# Patient Record
Sex: Female | Born: 1990
Health system: Southern US, Community
[De-identification: ages and names within clinical notes are randomized; demographics above are authoritative.]

## PROBLEM LIST (undated history)

## (undated) VITALS — BP 99/52 | HR 119 | Temp 97.6°F | Resp 16 | Ht 63.0 in | Wt 141.0 lb

## (undated) DIAGNOSIS — F419 Anxiety disorder, unspecified: Secondary | ICD-10-CM

## (undated) DIAGNOSIS — F32A Depression, unspecified: Secondary | ICD-10-CM

## (undated) DIAGNOSIS — F329 Major depressive disorder, single episode, unspecified: Secondary | ICD-10-CM

## (undated) DIAGNOSIS — K529 Noninfective gastroenteritis and colitis, unspecified: Secondary | ICD-10-CM

## (undated) DIAGNOSIS — S93409A Sprain of unspecified ligament of unspecified ankle, initial encounter: Secondary | ICD-10-CM

## (undated) DIAGNOSIS — K51011 Ulcerative (chronic) pancolitis with rectal bleeding: Secondary | ICD-10-CM

## (undated) DIAGNOSIS — E871 Hypo-osmolality and hyponatremia: Secondary | ICD-10-CM

## (undated) DIAGNOSIS — I82409 Acute embolism and thrombosis of unspecified deep veins of unspecified lower extremity: Secondary | ICD-10-CM

## (undated) HISTORY — DX: Hypo-osmolality and hyponatremia: E87.1

## (undated) HISTORY — PX: WISDOM TOOTH EXTRACTION: SHX21

## (undated) HISTORY — DX: Depression, unspecified: F32.A

## (undated) HISTORY — DX: Sprain of unspecified ligament of unspecified ankle, initial encounter: S93.409A

## (undated) HISTORY — DX: Major depressive disorder, single episode, unspecified: F32.9

---

## 2013-12-31 ENCOUNTER — Ambulatory Visit (INDEPENDENT_AMBULATORY_CARE_PROVIDER_SITE_OTHER): Payer: Self-pay | Admitting: Family Medicine

## 2013-12-31 ENCOUNTER — Encounter: Payer: Self-pay | Admitting: Family Medicine

## 2013-12-31 VITALS — BP 118/80 | HR 72 | Temp 98.2°F | Ht 64.0 in | Wt 137.5 lb

## 2013-12-31 DIAGNOSIS — F32A Depression, unspecified: Secondary | ICD-10-CM

## 2013-12-31 DIAGNOSIS — F3289 Other specified depressive episodes: Secondary | ICD-10-CM

## 2013-12-31 DIAGNOSIS — F329 Major depressive disorder, single episode, unspecified: Secondary | ICD-10-CM

## 2013-12-31 DIAGNOSIS — Z7689 Persons encountering health services in other specified circumstances: Secondary | ICD-10-CM

## 2013-12-31 DIAGNOSIS — Z7189 Other specified counseling: Secondary | ICD-10-CM

## 2013-12-31 NOTE — Progress Notes (Signed)
Pre visit review using our clinic review tool, if applicable. No additional management support is needed unless otherwise documented below in the visit note. 

## 2013-12-31 NOTE — Patient Instructions (Signed)
-  We have ordered labs or studies at this visit. It can take up to 1-2 weeks for results and processing. We will contact you with instructions IF your results are abnormal. Normal results will be released to your [facility]. If you have not heard from [place] or can not find your results in [facility] in 2 weeks please contact our office.  -PLEASE SIGN UP FOR MYCHART TODAY   We recommend the following healthy lifestyle measures: - eat a healthy diet consisting of lots of vegetables, fruits, beans, nuts, seeds, healthy meats such as white chicken and fish and whole grains.  - avoid fried foods, fast food, processed foods, sodas, red meet and other fattening foods.  - get a least 150 minutes of aerobic exercise per week.   -regular exercise, counseling and scheduling time for things you enjoy can help depression. If worsening please let us know.  Follow up in: as needed, yearly preventive visit

## 2013-12-31 NOTE — Progress Notes (Signed)
No chief complaint on file.   HPI:  Monica Ortega is here to establish care. New to getting a family doctor.  Last PCP and physical: sees Dr. Benjie Karvonen for yearly gyn exams and contraception  Has the following chronic problems and concerns today:  There are no active problems to display for this patient.  Depression: -trying to do exercise and getting out of the house and ok at this time -depressed mood and fatigue at times, has had counseling in the past, no medication or hospitalization in the past -no thoughts of self harm, no SI, no crying or anxiety  Contraception: -on OCP -tolerates will, no htn, migraine  -periods regular and normal  ROS: See pertinent positives and negatives per HPI.  Past Medical History  Diagnosis Date  . Depression     denies hospitalization or medication  . Ankle sprain     Family History  Problem Relation Age of Onset  . Diabetes Mother   . Hypertension Father     History   Social History  . Marital Status: Unknown    Spouse Name: N/A    Number of Children: N/A  . Years of Education: N/A   Social History Main Topics  . Smoking status: Never Smoker   . Smokeless tobacco: None  . Alcohol Use: Yes     Comment: 2 drinks per month  . Drug Use: No  . Sexual Activity: Not Currently   Other Topics Concern  . 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  GENERAL: vitals reviewed and listed above, alert, oriented, appears well hydrated and in no acute distress  HEENT: atraumatic, conjunttiva clear, no obvious abnormalities on inspection of external nose and ears  NECK: no obvious masses on inspection  LUNGS: clear to auscultation bilaterally, no wheezes, rales or rhonchi, good air movement  CV: HRRR, no peripheral edema  MS: moves all extremities without noticeable abnormality  PSYCH: pleasant and cooperative, no obvious depression or anxiety  ASSESSMENT AND PLAN:  Discussed the following assessment and plan:  Encounter to establish care  Depression   -We reviewed the PMH, PSH, FH, SH, Meds and Allergies. -We provided refills for any medications we will prescribe as needed. -We addressed current concerns per orders and patient instructions. -We have asked for records for pertinent exams, studies, vaccines and notes from previous providers. -We have advised patient to follow up per instructions below. -offered preventive visit - she will check with insurance and schedule if covered  -Patient advised to return or notify a doctor immediately if symptoms worsen or persist or new concerns arise.  Patient Instructions  -We have ordered labs or studies at this visit. It can take up to 1-2 weeks  for results and processing. We will contact you with instructions IF your results are abnormal. Normal results will be released to your [facility]. If you have not heard from [place] or can not find your results in [facility] in 2 weeks please contact our office.  -PLEASE SIGN UP FOR MYCHART TODAY   We recommend the following healthy lifestyle measures: - eat a healthy diet consisting of lots of vegetables, fruits, beans, nuts, seeds, healthy meats such as white chicken and fish and whole grains.  - avoid fried foods, fast food, processed foods, sodas, red meet and other fattening  foods.  - get a least 150 minutes of aerobic exercise per week.   -regular exercise, counseling and scheduling time for things you enjoy can help depression. If worsening please let us know.  Follow up in: as needed, yearly preventive visit      KIM, Nickola Major.

## 2014-09-10 ENCOUNTER — Ambulatory Visit (INDEPENDENT_AMBULATORY_CARE_PROVIDER_SITE_OTHER): Payer: BLUE CROSS/BLUE SHIELD | Admitting: Psychology

## 2014-09-10 DIAGNOSIS — F341 Dysthymic disorder: Secondary | ICD-10-CM | POA: Diagnosis not present

## 2014-09-18 ENCOUNTER — Ambulatory Visit (INDEPENDENT_AMBULATORY_CARE_PROVIDER_SITE_OTHER): Payer: BLUE CROSS/BLUE SHIELD | Admitting: Psychology

## 2014-09-18 DIAGNOSIS — F341 Dysthymic disorder: Secondary | ICD-10-CM | POA: Diagnosis not present

## 2014-09-26 ENCOUNTER — Ambulatory Visit (INDEPENDENT_AMBULATORY_CARE_PROVIDER_SITE_OTHER): Payer: BLUE CROSS/BLUE SHIELD | Admitting: Psychology

## 2014-09-26 DIAGNOSIS — F341 Dysthymic disorder: Secondary | ICD-10-CM

## 2014-10-09 ENCOUNTER — Ambulatory Visit (INDEPENDENT_AMBULATORY_CARE_PROVIDER_SITE_OTHER): Payer: BLUE CROSS/BLUE SHIELD | Admitting: Psychology

## 2014-10-09 DIAGNOSIS — F341 Dysthymic disorder: Secondary | ICD-10-CM | POA: Diagnosis not present

## 2014-10-16 ENCOUNTER — Ambulatory Visit (INDEPENDENT_AMBULATORY_CARE_PROVIDER_SITE_OTHER): Payer: BLUE CROSS/BLUE SHIELD | Admitting: Psychology

## 2014-10-16 DIAGNOSIS — F341 Dysthymic disorder: Secondary | ICD-10-CM | POA: Diagnosis not present

## 2014-10-20 ENCOUNTER — Encounter: Payer: Self-pay | Admitting: Family Medicine

## 2014-10-20 ENCOUNTER — Ambulatory Visit (INDEPENDENT_AMBULATORY_CARE_PROVIDER_SITE_OTHER): Payer: BLUE CROSS/BLUE SHIELD | Admitting: Family Medicine

## 2014-10-20 VITALS — BP 112/80 | HR 84 | Temp 97.4°F | Ht 64.0 in | Wt 139.2 lb

## 2014-10-20 DIAGNOSIS — J310 Chronic rhinitis: Secondary | ICD-10-CM

## 2014-10-20 DIAGNOSIS — R590 Localized enlarged lymph nodes: Secondary | ICD-10-CM

## 2014-10-20 DIAGNOSIS — J329 Chronic sinusitis, unspecified: Secondary | ICD-10-CM | POA: Diagnosis not present

## 2014-10-20 DIAGNOSIS — Z23 Encounter for immunization: Secondary | ICD-10-CM

## 2014-10-20 MED ORDER — AMOXICILLIN 875 MG PO TABS: 875.0000 mg | ORAL_TABLET | Freq: Two times a day (BID) | ORAL | Status: DC

## 2014-10-20 MED ORDER — FLUTICASONE PROPIONATE 50 MCG/ACT NA SUSP: 2.0000 | Freq: Every day | NASAL | Status: DC

## 2014-10-20 NOTE — Progress Notes (Signed)
HPI:  Acute visit for:  LAD: -started a few weeks ago -post cervical bilat, nontender -has had some PND, sneezing, nasal congestion -denies: cough, fevers, weight loss, malaise  ROS: See pertinent positives and negatives per HPI.  Past Medical History  Diagnosis Date  . Depression     denies hospitalization or medication  . Ankle sprain     Past Surgical History  Procedure Laterality Date  . Tonsillectomy    . Wisdom tooth extraction      Family History  Problem Relation Age of Onset  . Diabetes Mother   . Hypertension Father     History   Social History  . Marital Status: Unknown    Spouse Name: N/A  . Number of Children: N/A  . Years of Education: N/A   Social History Main Topics  . Smoking status: Never Smoker   . Smokeless tobacco: Not on file  . Alcohol Use: Yes     Comment: 2 drinks per month  . Drug Use: No  . Sexual Activity: Not Currently   Other Topics Concern  . 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  levonorgestrel-ethinyl estradiol (AVIANE,ALESSE,LESSINA) 0.1-20 MG-MCG tablet, Take 1 tablet by mouth daily., Disp: , Rfl:  .  Multiple Vitamin (MULTIVITAMIN) capsule, Take 1 capsule by mouth daily., Disp: , Rfl:  .  Omega-3 Fatty Acids (FISH OIL) 1200 MG CAPS, Take by mouth., Disp: , Rfl:  .  amoxicillin (AMOXIL) 875 MG tablet, Take 1 tablet (875 mg total) by mouth 2 (two) times daily., Disp: 20 tablet, Rfl: 0 .  fluticasone (FLONASE) 50 MCG/ACT nasal spray, Place 2 sprays into both nostrils daily., Disp: 16 g, Rfl: 1  EXAM:  Filed Vitals:   10/20/14 0757  BP: 112/80  Pulse: 84  Temp: 97.4 F (36.3 C)    Body mass index is 23.88 kg/(m^2).  GENERAL: vitals reviewed and listed above,  alert, oriented, appears well hydrated and in no acute distress  HEENT: atraumatic, conjunttiva clear, no obvious abnormalities on inspection of external nose and normal appearance of ear canals and TMs, clear nasal congestion, very boggy turbinates, mild post oropharyngeal erythema with PND, no tonsillar edema or exudate, no sinus TTP  NECK: no obvious masses on inspection, a few mobile small, non-tender ant cervical nodes bilat  LUNGS: clear to auscultation bilaterally, no wheezes, rales or rhonchi, good air movement  CV: HRRR, no peripheral edema  MS: moves all extremities without noticeable abnormality  PSYCH: pleasant and cooperative, no obvious depression or anxiety  ASSESSMENT AND PLAN:  Discussed the following assessment and plan:  Rhinosinusitis - Plan: fluticasone (FLONASE) 50 MCG/ACT nasal spray  LAD (lymphadenopathy), cervical - Plan: amoxicillin (AMOXIL) 875 MG tablet  -likely reactive from resp infection or allergies or both - advised tx of both and follow up in 2-4 weeks -she may see ENT if still with LAD after treatment and number provided -Tdap today -Patient advised to return or notify a doctor immediately if symptoms worsen or persist or new concerns arise.  Patient Instructions  Take the antibiotic to finish the full course  Use flonase 2 sprays each nostril daily  for 4 weeks  Use claritin or zyrtec daily for 4 weeks  See ENT if the bumps in the next persist     KIM, HANNAH R.

## 2014-10-20 NOTE — Progress Notes (Signed)
Pre visit review using our clinic review tool, if applicable. No additional management support is needed unless otherwise documented below in the visit note. 

## 2014-10-20 NOTE — Patient Instructions (Addendum)
BEFORE YOU LEAVE: -Tdap  Take the antibiotic to finish the full course  Use flonase 2 sprays each nostril daily for 4 weeks  Use claritin or zyrtec daily for 4 weeks (over the counter)  See ENT if the bumps in the next persist

## 2014-10-23 ENCOUNTER — Ambulatory Visit (INDEPENDENT_AMBULATORY_CARE_PROVIDER_SITE_OTHER): Payer: BLUE CROSS/BLUE SHIELD | Admitting: Psychology

## 2014-10-23 DIAGNOSIS — F341 Dysthymic disorder: Secondary | ICD-10-CM

## 2014-10-30 ENCOUNTER — Ambulatory Visit (INDEPENDENT_AMBULATORY_CARE_PROVIDER_SITE_OTHER): Payer: BLUE CROSS/BLUE SHIELD | Admitting: Psychology

## 2014-10-30 DIAGNOSIS — F341 Dysthymic disorder: Secondary | ICD-10-CM | POA: Diagnosis not present

## 2014-11-06 ENCOUNTER — Ambulatory Visit: Payer: BLUE CROSS/BLUE SHIELD | Admitting: Psychology

## 2014-11-13 ENCOUNTER — Ambulatory Visit (INDEPENDENT_AMBULATORY_CARE_PROVIDER_SITE_OTHER): Payer: BLUE CROSS/BLUE SHIELD | Admitting: Psychology

## 2014-11-13 DIAGNOSIS — F341 Dysthymic disorder: Secondary | ICD-10-CM | POA: Diagnosis not present

## 2014-11-20 ENCOUNTER — Ambulatory Visit (INDEPENDENT_AMBULATORY_CARE_PROVIDER_SITE_OTHER): Payer: BLUE CROSS/BLUE SHIELD | Admitting: Psychology

## 2014-11-20 DIAGNOSIS — F341 Dysthymic disorder: Secondary | ICD-10-CM | POA: Diagnosis not present

## 2014-11-27 ENCOUNTER — Ambulatory Visit (INDEPENDENT_AMBULATORY_CARE_PROVIDER_SITE_OTHER): Payer: BLUE CROSS/BLUE SHIELD | Admitting: Psychology

## 2014-11-27 DIAGNOSIS — F341 Dysthymic disorder: Secondary | ICD-10-CM | POA: Diagnosis not present

## 2014-12-04 ENCOUNTER — Ambulatory Visit (INDEPENDENT_AMBULATORY_CARE_PROVIDER_SITE_OTHER): Payer: BLUE CROSS/BLUE SHIELD | Admitting: Psychology

## 2014-12-04 DIAGNOSIS — F341 Dysthymic disorder: Secondary | ICD-10-CM

## 2014-12-11 ENCOUNTER — Ambulatory Visit (INDEPENDENT_AMBULATORY_CARE_PROVIDER_SITE_OTHER): Payer: BLUE CROSS/BLUE SHIELD | Admitting: Psychology

## 2014-12-11 DIAGNOSIS — F341 Dysthymic disorder: Secondary | ICD-10-CM | POA: Diagnosis not present

## 2014-12-18 ENCOUNTER — Ambulatory Visit (INDEPENDENT_AMBULATORY_CARE_PROVIDER_SITE_OTHER): Payer: BLUE CROSS/BLUE SHIELD | Admitting: Psychology

## 2014-12-18 DIAGNOSIS — F341 Dysthymic disorder: Secondary | ICD-10-CM

## 2014-12-25 ENCOUNTER — Ambulatory Visit (INDEPENDENT_AMBULATORY_CARE_PROVIDER_SITE_OTHER): Payer: BLUE CROSS/BLUE SHIELD | Admitting: Psychology

## 2014-12-25 DIAGNOSIS — F341 Dysthymic disorder: Secondary | ICD-10-CM | POA: Diagnosis not present

## 2015-01-01 ENCOUNTER — Ambulatory Visit (INDEPENDENT_AMBULATORY_CARE_PROVIDER_SITE_OTHER): Payer: BLUE CROSS/BLUE SHIELD | Admitting: Psychology

## 2015-01-01 DIAGNOSIS — F341 Dysthymic disorder: Secondary | ICD-10-CM

## 2015-01-02 ENCOUNTER — Ambulatory Visit: Payer: BLUE CROSS/BLUE SHIELD | Admitting: Psychology

## 2015-01-08 ENCOUNTER — Ambulatory Visit (INDEPENDENT_AMBULATORY_CARE_PROVIDER_SITE_OTHER): Payer: BLUE CROSS/BLUE SHIELD | Admitting: Psychology

## 2015-01-08 DIAGNOSIS — F341 Dysthymic disorder: Secondary | ICD-10-CM | POA: Diagnosis not present

## 2015-01-15 ENCOUNTER — Ambulatory Visit: Payer: BLUE CROSS/BLUE SHIELD | Admitting: Psychology

## 2015-01-22 ENCOUNTER — Ambulatory Visit (INDEPENDENT_AMBULATORY_CARE_PROVIDER_SITE_OTHER): Payer: BLUE CROSS/BLUE SHIELD | Admitting: Psychology

## 2015-01-22 DIAGNOSIS — F341 Dysthymic disorder: Secondary | ICD-10-CM

## 2015-02-05 ENCOUNTER — Ambulatory Visit (INDEPENDENT_AMBULATORY_CARE_PROVIDER_SITE_OTHER): Payer: BLUE CROSS/BLUE SHIELD | Admitting: Psychology

## 2015-02-05 DIAGNOSIS — F341 Dysthymic disorder: Secondary | ICD-10-CM | POA: Diagnosis not present

## 2015-02-19 ENCOUNTER — Ambulatory Visit (INDEPENDENT_AMBULATORY_CARE_PROVIDER_SITE_OTHER): Payer: BLUE CROSS/BLUE SHIELD | Admitting: Psychology

## 2015-02-19 DIAGNOSIS — F341 Dysthymic disorder: Secondary | ICD-10-CM | POA: Diagnosis not present

## 2015-03-05 ENCOUNTER — Ambulatory Visit (INDEPENDENT_AMBULATORY_CARE_PROVIDER_SITE_OTHER): Payer: BLUE CROSS/BLUE SHIELD | Admitting: Psychology

## 2015-03-05 DIAGNOSIS — F341 Dysthymic disorder: Secondary | ICD-10-CM

## 2015-03-09 ENCOUNTER — Encounter: Payer: Self-pay | Admitting: Family Medicine

## 2015-03-09 ENCOUNTER — Ambulatory Visit (INDEPENDENT_AMBULATORY_CARE_PROVIDER_SITE_OTHER): Payer: BLUE CROSS/BLUE SHIELD | Admitting: Family Medicine

## 2015-03-09 VITALS — BP 100/60 | HR 77 | Temp 97.9°F | Ht 64.0 in | Wt 139.8 lb

## 2015-03-09 DIAGNOSIS — F32A Depression, unspecified: Secondary | ICD-10-CM

## 2015-03-09 DIAGNOSIS — F329 Major depressive disorder, single episode, unspecified: Secondary | ICD-10-CM

## 2015-03-09 MED ORDER — ESCITALOPRAM OXALATE 10 MG PO TABS: 10.0000 mg | ORAL_TABLET | Freq: Every day | ORAL | Status: DC

## 2015-03-09 NOTE — Patient Instructions (Signed)
PLEASE SCHEDULE FOLLOW UP IN 1 month  Start the medication and take once daily; do not stop this medication suddenly

## 2015-03-09 NOTE — Progress Notes (Signed)
Pre visit review using our clinic review tool, if applicable. No additional management support is needed unless otherwise documented below in the visit note. 

## 2015-03-09 NOTE — Progress Notes (Signed)
  HPI:  Acute visit for:  Depression: -started several years ago, worse this last year -seeing Dr. Glennon Hamilton and doing cbt, however is still suffering from depressed mood on most days, not much irritability, no anxiety or panic per her report, no manic symptoms Depression Symptoms: Sleep disorder: 70% Interest deficit/anhedonia: 90% Guilt (worthlessness, hopelessness, regret): 80% Energy deficit: 80% Concentration deficit:90% Appetite disorder:0% Psychomotor retardation or agitation: sluggish 90% Suicidality: no No family members on medications, she has not taken medications in th past  ROS: See pertinent positives and negatives per HPI.  Past Medical History  Diagnosis Date  . Depression     denies hospitalization or medication  . Ankle sprain     Past Surgical History  Procedure Laterality Date  . Tonsillectomy    . Wisdom tooth extraction      Family History  Problem Relation Age of Onset  . Diabetes Mother   . Hypertension Father     Social History   Social History  . Marital Status: Unknown    Spouse Name: N/A  . Number of Children: N/A  . Years of Education: N/A   Social History Main Topics  . Smoking status: Never Smoker   . Smokeless tobacco: None  . Alcohol Use: Yes     Comment: 2 drinks per month  . Drug Use: No  . Sexual Activity: Not Currently   Other Topics Concern  . 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  levonorgestrel-ethinyl estradiol (AVIANE,ALESSE,LESSINA) 0.1-20 MG-MCG tablet, Take 1 tablet by mouth daily., Disp: , Rfl:  .  Multiple Vitamin (MULTIVITAMIN) capsule, Take 1 capsule by mouth daily., Disp: , Rfl:  .  Omega-3 Fatty Acids (FISH OIL) 1200 MG CAPS, Take by mouth., Disp: , Rfl:   .  escitalopram (LEXAPRO) 10 MG tablet, Take 1 tablet (10 mg total) by mouth daily., Disp: 30 tablet, Rfl: 2  EXAM:  Filed Vitals:   03/09/15 0907  BP: 100/60  Pulse: 77  Temp: 97.9 F (36.6 C)    Body mass index is 23.98 kg/(m^2).  GENERAL: vitals reviewed and listed above, alert, oriented, appears well hydrated and in no acute distress  HEENT: atraumatic, conjunttiva clear, no obvious abnormalities on inspection of external nose and ears  NECK: no obvious masses on inspection  LUNGS: clear to auscultation bilaterally, no wheezes, rales or rhonchi, good air movement  CV: HRRR, no peripheral edema  MS: moves all extremities without noticeable abnormality  PSYCH: pleasant and cooperative, depressed mood  ASSESSMENT AND PLAN:  Discussed the following assessment and plan:  Depression  -discussed pharmacological options, she decided to try lexapro, discussed risks, follow up in 1 month or sooner as needed -Patient advised to return or notify a doctor immediately if symptoms worsen or persist or new concerns arise.  Patient Instructions  PLEASE SCHEDULE FOLLOW UP IN 1 month  Start the medication and take once daily; do not stop this medication suddenly     KIM, Jarrett Soho R.

## 2015-03-19 ENCOUNTER — Ambulatory Visit (INDEPENDENT_AMBULATORY_CARE_PROVIDER_SITE_OTHER): Payer: BLUE CROSS/BLUE SHIELD | Admitting: Psychology

## 2015-03-19 ENCOUNTER — Telehealth: Payer: Self-pay | Admitting: Family Medicine

## 2015-03-19 DIAGNOSIS — F341 Dysthymic disorder: Secondary | ICD-10-CM | POA: Diagnosis not present

## 2015-03-19 NOTE — Telephone Encounter (Signed)
Patient came in today and want to let the MD know that she is having a hard time with Lexapro. Patient states that is it harder to drive and is willing to try another medication.

## 2015-03-19 NOTE — Telephone Encounter (Signed)
Left a message for the pt to return my call.  

## 2015-03-20 NOTE — Telephone Encounter (Signed)
Pt is aware md out of office today and joann

## 2015-03-20 NOTE — Telephone Encounter (Signed)
Called and spoke with patient. Patient states she was started on Lexapro 10 mg on 03/09/15. Patient states since then, she has been much more lethargic, dizzy spells, and difficulty focusing while driving. She continues stating her mood has not improved and is still having intermittent thoughts of hurting herself. She does not have a plan. Denies homicidal ideations. Spoke with Dr. Elease Hashimoto and he advised patient to cut pill in half and only take 5 mg through the weekend. In addition, I told patient if moods worsen, thoughts of hurting self become more consistent, or begins to develop a plan, to seek immediate treatment at [facility] or the ED. Patient verbalized understanding. Patient has appointment to see DO Kim Monday at 10:45 to follow-up on this.

## 2015-03-23 ENCOUNTER — Encounter: Payer: Self-pay | Admitting: Family Medicine

## 2015-03-23 ENCOUNTER — Ambulatory Visit (INDEPENDENT_AMBULATORY_CARE_PROVIDER_SITE_OTHER): Payer: BLUE CROSS/BLUE SHIELD | Admitting: Family Medicine

## 2015-03-23 VITALS — BP 104/82 | HR 78 | Temp 98.0°F | Ht 64.0 in | Wt 140.3 lb

## 2015-03-23 DIAGNOSIS — F331 Major depressive disorder, recurrent, moderate: Secondary | ICD-10-CM | POA: Diagnosis not present

## 2015-03-23 NOTE — Telephone Encounter (Signed)
Patient was seen by Dr Maudie Mercury.

## 2015-03-23 NOTE — Progress Notes (Signed)
HPI:  Depression: -started several years ago, worse this last year -seeing Dr. Glennon Hamilton and doing cbt, however is still suffering from depressed mood on most days, not much irritability, no anxiety or panic per her report, no manic symptoms -started lexapro 73m 03/16/15 and feels was too strong of a dose as caused excessive fatigue -she cut the dose down to 560mthe last 3 days and reports feels much better -she is not sure of impact on mood yet, but was able to go running this weekend and energy was much improved -denies: SOB, rash, GI distress, SI, thoughts of self harm   ROS: See pertinent positives and negatives per HPI.  Past Medical History  Diagnosis Date  . Depression     denies hospitalization or medication  . Ankle sprain     Past Surgical History  Procedure Laterality Date  . Tonsillectomy    . Wisdom tooth extraction      Family History  Problem Relation Age of Onset  . Diabetes Mother   . Hypertension Father     Social History   Social History  . Marital Status: Unknown    Spouse Name: N/A  . Number of Children: N/A  . Years of Education: N/A   Social History Main Topics  . Smoking status: Never Smoker   . Smokeless tobacco: None  . Alcohol Use: Yes     Comment: 2 drinks per month  . Drug Use: No  . Sexual Activity: Not Currently   Other Topics Concern  . 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  escitalopram (LEXAPRO) 10 MG tablet, Take 1 tablet (10 mg total) by mouth daily. (Patient taking differently: Take 5 mg by mouth daily. ), Disp: 30 tablet, Rfl: 2 .  levonorgestrel-ethinyl estradiol (AVIANE,ALESSE,LESSINA) 0.1-20 MG-MCG tablet, Take 1 tablet by mouth daily., Disp: , Rfl:  .  Multiple Vitamin  (MULTIVITAMIN) capsule, Take 1 capsule by mouth daily., Disp: , Rfl:  .  Omega-3 Fatty Acids (FISH OIL) 1200 MG CAPS, Take by mouth., Disp: , Rfl:   EXAM:  Filed Vitals:   03/23/15 1044  BP: 104/82  Pulse: 78  Temp: 98 F (36.7 C)    Body mass index is 24.07 kg/(m^2).  GENERAL: vitals reviewed and listed above, alert, oriented, appears well hydrated and in no acute distress  HEENT: atraumatic, conjunttiva clear, no obvious abnormalities on inspection of external nose and ears  NECK: no obvious masses on inspection  LUNGS: clear to auscultation bilaterally, no wheezes, rales or rhonchi, good air movement  CV: HRRR, no peripheral edema  MS: moves all extremities without noticeable abnormality  PSYCH: pleasant and cooperative, no obvious depression or anxiety  ASSESSMENT AND PLAN:  Discussed the following assessment and plan:  MDD (major depressive disorder), recurrent episode, moderate  -discussed options including continuing low dose for now versus change in medication -she opted to stick with 6m22mose, CBT and close follow up in 4 weeks -advise emergency precautions if any SI or thoughts of self harm -Patient advised to return or notify a doctor immediately if symptoms worsen or persist or new concerns arise.  There are  no Patient Instructions on file for this visit.   [name] R.

## 2015-03-23 NOTE — Progress Notes (Signed)
Pre visit review using our clinic review tool, if applicable. No additional management support is needed unless otherwise documented below in the visit note. 

## 2015-04-02 ENCOUNTER — Ambulatory Visit (INDEPENDENT_AMBULATORY_CARE_PROVIDER_SITE_OTHER): Payer: BLUE CROSS/BLUE SHIELD | Admitting: Psychology

## 2015-04-02 DIAGNOSIS — F341 Dysthymic disorder: Secondary | ICD-10-CM | POA: Diagnosis not present

## 2015-04-10 ENCOUNTER — Ambulatory Visit: Payer: Self-pay | Admitting: Family Medicine

## 2015-04-16 ENCOUNTER — Ambulatory Visit (INDEPENDENT_AMBULATORY_CARE_PROVIDER_SITE_OTHER): Payer: BLUE CROSS/BLUE SHIELD | Admitting: Psychology

## 2015-04-16 ENCOUNTER — Inpatient Hospital Stay (HOSPITAL_COMMUNITY)
Admission: RE | Admit: 2015-04-16 | Discharge: 2015-04-19 | DRG: 885 | Disposition: A | Discharge status: Home / Self Care | Payer: BLUE CROSS/BLUE SHIELD | Attending: Psychiatry | Admitting: Psychiatry

## 2015-04-16 ENCOUNTER — Encounter (HOSPITAL_COMMUNITY): Payer: Self-pay | Admitting: Nurse Practitioner

## 2015-04-16 DIAGNOSIS — F332 Major depressive disorder, recurrent severe without psychotic features: Secondary | ICD-10-CM | POA: Diagnosis present

## 2015-04-16 DIAGNOSIS — F341 Dysthymic disorder: Secondary | ICD-10-CM | POA: Diagnosis not present

## 2015-04-16 DIAGNOSIS — Z79899 Other long term (current) drug therapy: Secondary | ICD-10-CM | POA: Diagnosis not present

## 2015-04-16 DIAGNOSIS — R45851 Suicidal ideations: Secondary | ICD-10-CM | POA: Diagnosis present

## 2015-04-16 DIAGNOSIS — F321 Major depressive disorder, single episode, moderate: Secondary | ICD-10-CM

## 2015-04-16 DIAGNOSIS — F329 Major depressive disorder, single episode, unspecified: Secondary | ICD-10-CM | POA: Diagnosis present

## 2015-04-16 HISTORY — DX: Anxiety disorder, unspecified: F41.9

## 2015-04-16 LAB — COMPREHENSIVE METABOLIC PANEL
ALT: 18 U/L (ref 14–54)
AST: 23 U/L (ref 15–41)
Albumin: 3.7 g/dL (ref 3.5–5.0)
Alkaline Phosphatase: 45 U/L (ref 38–126)
Anion gap: 7 (ref 5–15)
BUN: 8 mg/dL (ref 6–20)
CO2: 26 mmol/L (ref 22–32)
Calcium: 8.8 mg/dL — ABNORMAL LOW (ref 8.9–10.3)
Chloride: 104 mmol/L (ref 101–111)
Creatinine, Ser: 0.74 mg/dL (ref 0.44–1.00)
GFR calc Af Amer: >60 mL/min (ref >60)
GFR calc non Af Amer: >60 mL/min (ref >60)
Glucose, Bld: 118 mg/dL — ABNORMAL HIGH (ref 65–99)
Potassium: 4.4 mmol/L (ref 3.5–5.1)
Sodium: 137 mmol/L (ref 135–145)
Total Bilirubin: 0.4 mg/dL (ref 0.3–1.2)
Total Protein: 7.1 g/dL (ref 6.5–8.1)

## 2015-04-16 LAB — TSH: TSH: 2.356 u[IU]/mL (ref 0.350–4.500)

## 2015-04-16 LAB — CBC
HCT: 41.4 % (ref 36.0–46.0)
Hemoglobin: 13.7 g/dL (ref 12.0–15.0)
MCH: 29.1 pg (ref 26.0–34.0)
MCHC: 33.1 g/dL (ref 30.0–36.0)
MCV: 87.9 fL (ref 78.0–100.0)
Platelets: 255 10*3/uL (ref 150–400)
RBC: 4.71 MIL/uL (ref 3.87–5.11)
RDW: 12.2 % (ref 11.5–15.5)
WBC: 6.1 10*3/uL (ref 4.0–10.5)

## 2015-04-16 LAB — RAPID URINE DRUG SCREEN, HOSP PERFORMED
Amphetamines: NOT DETECTED
Barbiturates: NOT DETECTED
Benzodiazepines: NOT DETECTED
Cocaine: NOT DETECTED
Opiates: NOT DETECTED
Tetrahydrocannabinol: NOT DETECTED

## 2015-04-16 LAB — PREGNANCY, URINE: Preg Test, Ur: NEGATIVE

## 2015-04-16 MED ORDER — INFLUENZA VAC SPLIT QUAD 0.5 ML IM SUSY
0.5000 mL | PREFILLED_SYRINGE | INTRAMUSCULAR | Status: AC
  Administered 2015-04-17: 0.5 mL via INTRAMUSCULAR
  Filled 2015-04-16: qty 0.5

## 2015-04-16 MED ORDER — SERTRALINE HCL 25 MG PO TABS
12.5000 mg | ORAL_TABLET | Freq: Every day | ORAL | Status: DC
  Administered 2015-04-17: 12.5 mg via ORAL
  Filled 2015-04-16 (×2): qty 0.5

## 2015-04-16 MED ORDER — ALUM & MAG HYDROXIDE-SIMETH 200-200-20 MG/5ML PO SUSP: 30.0000 mL | ORAL | Status: DC | PRN

## 2015-04-16 MED ORDER — TRAZODONE HCL 50 MG PO TABS
50.0000 mg | ORAL_TABLET | Freq: Every evening | ORAL | Status: DC | PRN
  Administered 2015-04-16 – 2015-04-18 (×3): 50 mg via ORAL
  Filled 2015-04-16 (×3): qty 1

## 2015-04-16 MED ORDER — MAGNESIUM HYDROXIDE 400 MG/5ML PO SUSP: 30.0000 mL | Freq: Every day | ORAL | Status: DC | PRN

## 2015-04-16 MED ORDER — ACETAMINOPHEN 325 MG PO TABS
650.0000 mg | ORAL_TABLET | Freq: Four times a day (QID) | ORAL | Status: DC | PRN
  Administered 2015-04-18: 650 mg via ORAL
  Filled 2015-04-16: qty 2

## 2015-04-16 NOTE — [facility] Group Notes (Signed)
[facility] [employer] Group Therapy 04/16/2015 1:15pm  Type of Therapy: Mental Health Association Presentation  Participation Level: Active  Participation Quality: Attentive  Affect: Appropriate  Cognitive: Oriented  Insight: Developing/Improving  Engagement in Therapy: Engaged  Modes of Intervention: Discussion, Education and Socialization  Summary of Progress/Problems: Mental Health Association ([place]) Speaker came to talk about his personal journey with substance abuse and addiction. The pt processed ways by which to relate to the speaker. [place] speaker provided handouts and educational information pertaining to groups and services offered by the [facility]. Pt was engaged in speaker's presentation and was receptive to resources provided.    Peri Maris, [place] 04/16/2015 1:33 PM

## 2015-04-16 NOTE — Progress Notes (Signed)
EKG COMPLETED AND PUT IN MD OFFICE FOR REVIEW.

## 2015-04-16 NOTE — BHH Suicide Risk Assessment (Signed)
[facility] Admission Suicide Risk Assessment   Nursing information obtained from:  Patient Demographic factors:  Adolescent or young adult, Caucasian Current Mental Status:  NA Loss Factors:  NA Historical Factors:  NA Risk Reduction Factors:  Sense of responsibility to family, Employed, Living with another person, especially a relative, Positive social support Total Time spent with patient: 30 minutes Principal Problem: MDD (major depressive disorder), recurrent severe, without psychosis ([place]) Diagnosis:   Patient Active Problem List   Diagnosis Date Noted  . MDD (major depressive disorder), recurrent severe, without psychosis ([place]) [F33.2] 04/16/2015     Continued Clinical Symptoms:  Alcohol Use Disorder Identification Test Final Score (AUDIT): 0 The "Alcohol Use Disorders Identification Test", Guidelines for Use in Primary Care, Second Edition.  World Pharmacologist [facility]). Score between 0-7:  no or low risk or alcohol related problems. Score between 8-15:  moderate risk of alcohol related problems. Score between 16-19:  high risk of alcohol related problems. Score 20 or above:  warrants further diagnostic evaluation for alcohol dependence and treatment.   CLINICAL FACTORS:   Depression:   Anhedonia     Psychiatric Specialty Exam: Physical Exam  ROS  Blood pressure 130/85, pulse 59, temperature 98.2 F (36.8 C), temperature source Oral, resp. rate 18, height 5' 3"  (1.6 m), weight 63.957 kg (141 lb), last menstrual period 03/26/2015, SpO2 100 %.Body mass index is 24.98 kg/(m^2).              Please see H&P.                                            COGNITIVE FEATURES THAT CONTRIBUTE TO RISK:  None    SUICIDE RISK:   Moderate:  Frequent suicidal ideation with limited intensity, and duration, some specificity in terms of plans, no associated intent, good self-control, limited dysphoria/symptomatology, some risk factors present, and identifiable  protective factors, including available and accessible social support.  PLAN OF CARE: Please see H&P.   Medical Decision Making:  Review of Psycho-Social Stressors (1), Review or order clinical lab tests (1), Established Problem, Worsening (2), Review of Last Therapy Session (1), Review of Medication Regimen & Side Effects (2) and Review of New Medication or Change in Dosage (2)  I certify that inpatient services furnished can reasonably be expected to improve the patient's condition.   Eappen,Saramma MD 04/16/2015, 3:32 PM

## 2015-04-16 NOTE — H&P (Signed)
Psychiatric Admission Assessment Adult  Patient Identification: Monica Ortega MRN:  [id number] Date of Evaluation:  04/16/2015 Chief Complaint: Pt states " I have these intense suicidal thoughts.'  Principal Diagnosis: MDD (major depressive disorder), recurrent severe, without psychosis ([place]) Diagnosis:   Patient Active Problem List   Diagnosis Date Noted  . MDD (major depressive disorder), recurrent severe, without psychosis ([place]) [F33.2] 04/16/2015   History of Present Illness::Monica Ortega is a 40 y old CF with hx of depression , is single , lives with her dad, is employed , works at an [employer] . Pt presented with Lead , had thoughts to harm self with a knife. Pt reports that she currently lives with dad. Her mom and dad have been divorced. Pt also has a sister who is older , who lives with her. Pt is a Writer, has bachelor degree from [employer] Pt has a hx of depression. Pt was being treated for depression - was on Celexa, she was having side effects and has been trying to taper self off of it . Pt has been taking only 1/2 -1/4 pill. However pt reported having worsening SI recently.Pt also sees a therapist -Dr.Bray , at [facility] , [place].  Pt reports that she cannot point out any triggers in her life at this time.  Pt denies any hx of sexual of physical abuse.  Pt denies any hx of Suicide attempts. Pt denies AH/VH/Paranoia.  Pt denies any hx of substance abuse.   Associated Signs/Symptoms: Depression Symptoms:  depressed mood, anhedonia, psychomotor retardation, fatigue, difficulty concentrating, hopelessness, recurrent thoughts of death, suicidal thoughts with specific plan, anxiety, decreased appetite, (Hypo) Manic Symptoms: denies Anxiety Symptoms:  denies Psychotic Symptoms:  denies PTSD Symptoms: Negative Total Time spent with patient: 45 minutes  Past Psychiatric History: Pt has a hx of depression. Pt was being treated for depression - was on  Celexa, she was having side effects and has been trying to taper self off of it . Pt has been taking only 1/2 -1/4 pill. However pt reported having worsening SI recently.Pt also sees a therapist -Dr.Bray , at [facility] , [place] [facility].Pt denies hx of mental health hospitalization. Pt denies hx of suicide attempts.     Risk to Self: Suicidal Ideation: Yes-Currently Present Suicidal Intent: Yes-Currently Present Is patient at risk for suicide?: No Suicidal Plan?: Yes-Currently Present Specify Current Suicidal Plan: To possibly cut herself Access to Means: Yes Specify Access to Suicidal Means: access to a knife What has been your use of drugs/alcohol within the last 12 months?: Pt denies hx of drig use. Pt says she only drinks socially. How many times?: 0 Other Self Harm Risks: NA Triggers for Past Attempts: None known Intentional Self Injurious Behavior: None Risk to Others: Homicidal Ideation: No Thoughts of Harm to Others: No Current Homicidal Intent: No Current Homicidal Plan: No Access to Homicidal Means: No Identified Victim: NA History of harm to others?: No Assessment of Violence: None Noted Violent Behavior Description: NA Does patient have access to weapons?: No Criminal Charges Pending?: No Does patient have a court date: No Prior Inpatient Therapy: Prior Inpatient Therapy: No Prior Therapy Dates: NA Prior Therapy Facilty/Provider(s): NA Reason for Treatment: NA Prior Outpatient Therapy: Prior Outpatient Therapy: No Prior Therapy Dates: NA Prior Therapy Facilty/Provider(s): NA Reason for Treatment: NA Does patient have an ACCT team?: No Does patient have Intensive In-House Services?  : No Does patient have [facility] services? : No Does patient have P4CC services?: No  Alcohol  Screening: 1. How often do you have a drink containing alcohol?: Never 2. How many drinks containing alcohol do you have on a typical day when you are drinking?:  (none) 3. How often do you have six  or more drinks on one occasion?: Never Preliminary Score: 0 9. Have you or someone else been injured as a result of your drinking?: No 10. Has a relative or friend or a doctor or another health worker been concerned about your drinking or suggested you cut down?: No Alcohol Use Disorder Identification Test Final Score (AUDIT): 0 Brief Intervention: AUDIT score less than 7 or less-screening does not suggest unhealthy drinking-brief intervention not indicated Substance Abuse History in the last 12 months:  No. Consequences of Substance Abuse: Negative Previous Psychotropic Medications: Yes , celexa 5 mg  Psychological Evaluations: No  Past Medical History:  Past Medical History  Diagnosis Date  . Depression     denies hospitalization or medication  . Ankle sprain   . Anxiety     Past Surgical History  Procedure Laterality Date  . Tonsillectomy    . Wisdom tooth extraction     Family History:  Family History  Problem Relation Age of Onset  . Diabetes Mother   . Hypertension Father    Family Psychiatric  History: Pt denies hx of mental illness, substance abuse, suicide in family.  Social History: Patient lives with her dad since 2014. Pt is employed, as an Environmental consultant at [employer]. History  Alcohol Use No    Comment: denied alcohol     History  Drug Use No    Comment: denied    Social History   Social History  . Marital Status: Unknown    Spouse Name: N/A  . Number of Children: N/A  . Years of Education: N/A   Social History Main Topics  . Smoking status: Never Smoker   . Smokeless tobacco: None  . Alcohol Use: No     Comment: denied alcohol  . Drug Use: No     Comment: denied  . Sexual Activity: Yes    Birth Control/ Protection: Pill   Other Topics Concern  . None   Social History Narrative   Work or Presenter, broadcasting Situation: lives with father and sister      Spiritual Beliefs: none, atheist      Lifestyle: 2-4 times per week  jogging or elliptical or bike; diet is so so            Additional Social History:    Pain Medications: denied  Prescriptions: lexapro 10 mg   fish oil   probiotic occasionally Over the Counter: fish oil   probiotic occasionally History of alcohol / drug use?: No history of alcohol / drug abuse Longest period of sobriety (when/how long): none Negative Consequences of Use: Work / Youth worker Withdrawal Symptoms: Other (Comment) (denied alcohol/tobacco/drug abuse)                    Allergies:  No Known Allergies Lab Results: No results found for this or any previous visit (from the past 79 hour(s)).  Metabolic Disorder Labs:  No results found for: HGBA1C, MPG No results found for: PROLACTIN No results found for: CHOL, TRIG, HDL, CHOLHDL, VLDL, LDLCALC  Current Medications: Current Facility-Administered Medications  Medication Dose Route Frequency Provider Last Rate Last Dose  . acetaminophen (TYLENOL) tablet 650 mg  650 mg Oral Q6H PRN Kerrie Buffalo, NP      .  alum & mag hydroxide-simeth (MAALOX/MYLANTA) 200-200-20 MG/5ML suspension 30 mL  30 mL Oral Q4H PRN Kerrie Buffalo, NP      . Derrill Memo ON 04/17/2015] Influenza vac split quadrivalent PF (FLUARIX) injection 0.5 mL  0.5 mL Intramuscular Tomorrow-1000 Fernando A Cobos, MD      . magnesium hydroxide (MILK OF MAGNESIA) suspension 30 mL  30 mL Oral Daily PRN Kerrie Buffalo, NP      . Derrill Memo ON 04/17/2015] sertraline (ZOLOFT) tablet 12.5 mg  12.5 mg Oral Daily Saramma Eappen, MD      . traZODone (DESYREL) tablet 50 mg  50 mg Oral QHS PRN Kerrie Buffalo, NP       PTA Medications: Prescriptions prior to admission  Medication Sig Dispense Refill Last Dose  . aspirin-acetaminophen-caffeine (EXCEDRIN MIGRAINE) 250-250-65 MG tablet Take 2 tablets by mouth every 6 (six) hours as needed for headache.   Past Month at Unknown time  . escitalopram (LEXAPRO) 10 MG tablet Take 1 tablet (10 mg total) by mouth daily. (Patient taking differently:  Take 2.5 mg by mouth daily. ) 30 tablet 2 04/15/2015 at Unknown time  . levonorgestrel-ethinyl estradiol (AVIANE,ALESSE,LESSINA) 0.1-20 MG-MCG tablet Take 1 tablet by mouth daily.   04/15/2015 at Unknown time  . Multiple Vitamin (MULTIVITAMIN) capsule Take 1 capsule by mouth daily.   04/15/2015 at Unknown time  . omega-3 acid ethyl esters (LOVAZA) 1 G capsule Take 1 g by mouth daily.   04/15/2015 at Unknown time  . Probiotic Product (PROBIOTIC FORMULA) CAPS Take 1 capsule by mouth daily as needed (for constipation).   04/13/2015    Musculoskeletal: Strength & Muscle Tone: within normal limits Gait & Station: normal Patient leans: N/A  Psychiatric Specialty Exam: Physical Exam  Constitutional: She appears well-developed.  HENT:  Head: Normocephalic and atraumatic.  Eyes: Conjunctivae and EOM are normal.  Neck: Normal range of motion. Neck supple. No thyromegaly present.  Cardiovascular: Normal rate and regular rhythm.   Respiratory: Effort normal and breath sounds normal.  GI: Soft.  Musculoskeletal: Normal range of motion.  Neurological: She is alert.  Skin: Skin is warm.  Psychiatric: Her speech is normal. Judgment normal. She is withdrawn. Cognition and memory are normal. She exhibits a depressed mood. She expresses suicidal ideation.    Review of Systems  Psychiatric/Behavioral: Positive for depression and suicidal ideas.  All other systems reviewed and are negative.   Blood pressure 130/85, pulse 59, temperature 98.2 F (36.8 C), temperature source Oral, resp. rate 18, height 5' 3"  (1.6 m), weight 63.957 kg (141 lb), last menstrual period 03/26/2015, SpO2 100 %.Body mass index is 24.98 kg/(m^2).  General Appearance: Casual  Eye Contact::  Good  Speech:  Clear and Coherent  Volume:  Normal  Mood:  Depressed  Affect:  Appropriate  Thought Process:  Coherent  Orientation:  Full (Time, Place, and Person)  Thought Content:  Rumination  Suicidal Thoughts:  Yes.  without intent/plan   Homicidal Thoughts:  No  Memory:  Immediate;   Fair Recent;   Good Remote;   Good  Judgement:  Fair  Insight:  Fair  Psychomotor Activity:  Normal  Concentration:  Poor  Recall:  [place] of Knowledge:Fair  Language: Fair  Akathisia:  No  Handed:  Right  AIMS (if indicated):     Assets:  Communication Skills Desire for Improvement Financial Resources/Insurance [place] Talents/Skills Transportation Vocational/Educational  ADL's:  Intact  Cognition: WNL  Sleep:        Treatment  Plan Summary: Daily contact with patient to assess and evaluate symptoms and progress in treatment and Medication management    Patient will benefit from inpatient treatment and stabilization.  Estimated length of stay is 5-7 days.  Reviewed past medical records,treatment plan.  Will DC Celexa for lack of efficacy /side effects. Will start a trial of Zoloft 12.5 mg for affective sx. Pt wants to start at a lower dose due to hx of sensitivity to medications. Will continue Trazodone 50 mg po qhs prn for sleep. Will continue to monitor vitals ,medication compliance and treatment side effects while patient is here.  Will monitor for medical issues as well as call consult as needed.  Labs ,will order cbc, cmp,tsh,urine pregnancy, uds. CSW will start working on disposition.  Patient to participate in therapeutic milieu .          Observation Level/Precautions:  15 minute checks    Psychotherapy:  Individual and group therapy     Consultations:  Social worker  Discharge Concerns: stability and safety        I certify that inpatient services furnished can reasonably be expected to improve the patient's condition.   Eappen,Saramma MD 10/6/20163:33 PM

## 2015-04-16 NOTE — BH Assessment (Signed)
Tele Assessment Note   Monica Ortega is an 24 y.o. female. Pt reports SI. Pt states she does not have a clear plan but she thinks about harming herself daily. The Pt states that she was peeling an apple with a knife today and thought about using the knife to harm herself. Pt states she has a strong urge to harm herself. The Pt denies HI. Pt denies AVH. Pt states she sees Dr. Glennon Hamilton every other week. Pt is prescribed Lexapro. Pt states she is only taking a quarter of her Lexapro pill. Pt denies previous hospitalizations. Pt reports chronic depressive symptoms. Pt denies substance abuse. Pt denies self-harming behaviors. Pt denies previous abuse.   Writer consulted with May, NP. Per May Pt meets inpatient criteria. Pt accepted to [facility]. Pt accepted to 401-1.   Diagnosis:  Axis I: Major Depressive Disorder Axis II: Deferred Axis III: Please Pt's Medical Records Axis IV: social, primary support group Axis V: 28  Past Medical History:  Past Medical History  Diagnosis Date  . Depression     denies hospitalization or medication  . Ankle sprain     Past Surgical History  Procedure Laterality Date  . Tonsillectomy    . Wisdom tooth extraction      Family History:  Family History  Problem Relation Age of Onset  . Diabetes Mother   . Hypertension Father     Social History:  reports that she has never smoked. She does not have any smokeless tobacco history on file. She reports that she drinks alcohol. She reports that she does not use illicit drugs.  Additional Social History:  Alcohol / Drug Use Pain Medications: Pt denies  Prescriptions: Lexapro Over the Counter: Pt denies History of alcohol / drug use?: No history of alcohol / drug abuse Longest period of sobriety (when/how long): NA  CIWA:   COWS:    PATIENT STRENGTHS: (choose at least two) Capable of independent living Communication skills  Allergies: No Known Allergies  Home Medications:  Medications Prior to Admission   Medication Sig Dispense Refill  . escitalopram (LEXAPRO) 10 MG tablet Take 1 tablet (10 mg total) by mouth daily. (Patient taking differently: Take 5 mg by mouth daily. ) 30 tablet 2  . levonorgestrel-ethinyl estradiol (AVIANE,ALESSE,LESSINA) 0.1-20 MG-MCG tablet Take 1 tablet by mouth daily.    . Multiple Vitamin (MULTIVITAMIN) capsule Take 1 capsule by mouth daily.    . Omega-3 Fatty Acids (FISH OIL) 1200 MG CAPS Take by mouth.      OB/GYN Status:  No LMP recorded.  General Assessment Data Location of Assessment: [facility] Assessment Services TTS Assessment: In system Is this a Tele or Face-to-Face Assessment?: Face-to-Face Is this an Initial Assessment or a Re-assessment for this encounter?: Initial Assessment Marital status: Single Maiden name: NA Is patient pregnant?: No Pregnancy Status: No Living Arrangements: Alone Can pt return to current living arrangement?: Yes Admission Status: Voluntary Is patient capable of signing voluntary admission?: Yes Referral Source: Self/Family/Friend Insurance type: [place] Living Arrangements: Alone Name of Psychiatrist: NA Name of Therapist: NA  Education Status Is patient currently in school?: Yes Current Grade: NA Highest grade of school patient has completed: BA Name of school: NA Contact person: NA  Risk to self with the past 6 months Suicidal Ideation: Yes-Currently Present Has patient been a risk to self within the past 6 months prior to admission? : Yes Suicidal Intent: Yes-Currently Present Has patient had any suicidal intent within the  past 6 months prior to admission? : Yes Is patient at risk for suicide?: Yes Suicidal Plan?: Yes-Currently Present Has patient had any suicidal plan within the past 6 months prior to admission? : Yes Specify Current Suicidal Plan: To possibly cut herself Access to Means: Yes Specify Access to Suicidal Means: access to a knife What has been your use of drugs/alcohol within  the last 12 months?: NA Previous Attempts/Gestures: No How many times?: 0 Other Self Harm Risks: NA Triggers for Past Attempts: None known Intentional Self Injurious Behavior: None Family Suicide History: No Recent stressful life event(s): Other (Comment) (None) Persecutory voices/beliefs?: Yes Depression: Yes Depression Symptoms: Loss of interest in usual pleasures, Feeling worthless/self pity, Feeling angry/irritable, Fatigue, Isolating, Tearfulness Substance abuse history and/or treatment for substance abuse?: No Suicide prevention information given to non-admitted patients: Not applicable  Risk to Others within the past 6 months Homicidal Ideation: No Does patient have any lifetime risk of violence toward others beyond the six months prior to admission? : No Thoughts of Harm to Others: No Current Homicidal Intent: No Current Homicidal Plan: No Access to Homicidal Means: No Identified Victim: NA History of harm to others?: No Assessment of Violence: None Noted Violent Behavior Description: NA Does patient have access to weapons?: No Criminal Charges Pending?: No Does patient have a court date: No Is patient on probation?: No  Psychosis Hallucinations: None noted Delusions: None noted  Mental Status Report Appearance/Hygiene: Unremarkable Eye Contact: Good Motor Activity: Freedom of movement Speech: Logical/coherent Level of Consciousness: Alert Mood: Depressed Affect: Depressed Anxiety Level: None Thought Processes: Coherent, Relevant Judgement: Unimpaired Orientation: Person, Place, Time, Situation, Appropriate for developmental age Obsessive Compulsive Thoughts/Behaviors: None  Cognitive Functioning Concentration: Normal Memory: Recent Intact, Remote Intact IQ: Average Insight: Fair Impulse Control: Fair Appetite: Fair Weight Loss: 0 Weight Gain: 0 Sleep: Increased Total Hours of Sleep: 10 Vegetative Symptoms: None  ADLScreening [facility] Assessment  Services) Patient's cognitive ability adequate to safely complete daily activities?: Yes Patient able to express need for assistance with ADLs?: Yes Independently performs ADLs?: Yes (appropriate for developmental age)  Prior Inpatient Therapy Prior Inpatient Therapy: No Prior Therapy Dates: NA Prior Therapy Facilty/Provider(s): NA Reason for Treatment: NA  Prior Outpatient Therapy Prior Outpatient Therapy: No Prior Therapy Dates: NA Prior Therapy Facilty/Provider(s): NA Reason for Treatment: NA Does patient have an ACCT team?: No Does patient have Intensive In-House Services?  : No Does patient have [facility] services? : No Does patient have P4CC services?: No  ADL Screening (condition at time of admission) Patient's cognitive ability adequate to safely complete daily activities?: Yes Is the patient deaf or have difficulty hearing?: No Does the patient have difficulty seeing, even when wearing glasses/contacts?: No Does the patient have difficulty concentrating, remembering, or making decisions?: Yes Patient able to express need for assistance with ADLs?: Yes Does the patient have difficulty dressing or bathing?: Yes Independently performs ADLs?: Yes (appropriate for developmental age) Does the patient have difficulty walking or climbing stairs?: No Weakness of Legs: None Weakness of Arms/Hands: None             Advance Directives (For Healthcare) Does patient have an advance directive?: No Would patient like information on creating an advanced directive?: No - patient declined information    Additional Information 1:1 In Past 12 Months?: No CIRT Risk: No Elopement Risk: No Does patient have medical clearance?: No     Disposition:  Disposition Initial Assessment Completed for this Encounter: Yes Disposition of Patient: Inpatient treatment program  Type of inpatient treatment program: Adult  Cyndia Bent 04/16/2015 10:45 AM

## 2015-04-16 NOTE — Tx Team (Signed)
Initial Interdisciplinary Treatment Plan   PATIENT STRESSORS: Financial difficulties Health problems Medication change or noncompliance Occupational concerns   PATIENT STRENGTHS: Ability for insight Active sense of humor Average or above average intelligence Communication skills General fund of knowledge Motivation for treatment/growth Physical Health Supportive family/friends   PROBLEM LIST: Problem List/Patient Goals Date to be addressed Date deferred Reason deferred Estimated date of resolution  "suicidal thoughts" 04/16/2015   D/c  "anxiety" 04/16/2015   D/c  "depression" 04/16/2015   D/c  "panic attacks" 04/16/2015   D/c                                 DISCHARGE CRITERIA:  Ability to meet basic life and health needs Adequate post-discharge living arrangements Improved stabilization in mood, thinking, and/or behavior Medical problems require only outpatient monitoring Motivation to continue treatment in a less acute level of care Need for constant or close observation no longer present Reduction of life-threatening or endangering symptoms to within safe limits Safe-care adequate arrangements made Verbal commitment to aftercare and medication compliance  PRELIMINARY DISCHARGE PLAN: Attend aftercare/continuing care group Attend PHP/IOP Outpatient therapy Participate in family therapy Return to previous living arrangement Return to previous work or school arrangements  PATIENT/FAMIILY INVOLVEMENT: This treatment plan has been presented to and reviewed with the patient, Monica Ortega.  The patient and family have been given the opportunity to ask questions and make suggestions.  [name] 04/16/2015, 1:09 PM

## 2015-04-16 NOTE — Progress Notes (Signed)
D: Patient in her room in approach.  Patient states she is new to the unit and she is adjusting.  Patient states she is here because she was having aggressive thoughts of suicide but no plan.  Patient states, "I was feeling the need to die."  Patient states her goal for today was to have decreased SI thoughts.  Patient states she is no longer has SI thoughts at the time of assessment.  Patient denies SI/HI and AVH.   A: Staff to monitor Q 15 mins for safety.  Encouragement and support offered.  Scheduled medications administered per orders. R: Patient remains safe on the unit.  Patient attended group tonight.  Patient visible on the unit and interacting with peers.  Patient taking administered medications.

## 2015-04-16 NOTE — Progress Notes (Signed)
PATIENT SIGNED 72 HR REQUEST FOR DISCHARGE ON 04/16/2015 AT 1700.

## 2015-04-16 NOTE — Tx Team (Signed)
Interdisciplinary Treatment Plan Update (Adult) Date: 04/16/2015   Date: 04/16/2015 1:34 PM  Progress in Treatment:  Attending groups: Pt is new to milieu, continuing to assess  Participating in groups: Pt is new to milieu, continuing to assess  Taking medication as prescribed: Yes  Tolerating medication: Yes  Family/Significant othe contact made: No, CSW assessing for appropriate contact Patient understands diagnosis: Yes Discussing patient identified problems/goals with staff: Yes  Medical problems stabilized or resolved: Yes  Denies suicidal/homicidal ideation: Yes Patient has not harmed self or Others: Yes   New problem(s) identified: None identified at this time.   Discharge Plan or Barriers: Pt to return home and follow-up with outpatient resources  Additional comments: n/a   Reason for Continuation of Hospitalization:  Depression Medication stabilization Suicidal ideation  Estimated length of stay: 3-5 days  Review of initial/current patient goals per problem list:   1.  Goal(s): Patient will participate in aftercare plan  Met:  Yes  Target date: 3-5 days from date of admission   As evidenced by: Patient will participate within aftercare plan AEB aftercare provider and housing plan at discharge being identified.  04/16/15: Pt will return home and follow-up with outpatient resources.  2.  Goal (s): Patient will exhibit decreased depressive symptoms and suicidal ideations.  Met:  No  Target date: 3-5 days from date of admission   As evidenced by: Patient will utilize self rating of depression at 3 or below and demonstrate decreased signs of depression or be deemed stable for discharge by MD.  04/16/15: Pt rates depression at 8/10; denies SI  Attendees:  Patient:    Family:    Physician: Dr. Parke Poisson, MD  04/16/2015 1:34 PM  Nursing: Lars Pinks, RN Case manager  04/16/2015 1:34 PM  Clinical Social Worker Norman Clay, MSW 04/16/2015 1:34 PM  Other:  Lucinda Dell, Beverly Sessions Liasion 04/16/2015 1:34 PM  Clinical:  Grayland Ormond, RN 04/16/2015 1:34 PM  Other: , RN Charge Nurse 04/16/2015 1:34 PM  Other:     Peri Maris, [place] MSW

## 2015-04-16 NOTE — Progress Notes (Signed)
Patient is 24 yr old female, first admission to [facility], voluntary.  Patient stated she has a strong desire to hurt herself daily.  During admission, patient denied SI, contracts for safety.  Denied HI.  Denied A/V hallucinations.  Denied pain.  Rated anxiety 1-2, depression and hopeless #8.  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  Has bachelor degree from [place].  Wears glasses, contacts at home.  No dental or hearing problems.  Denied all abuse.  Denied alcohol and all drugs, tobacco.  Stated she has been "very depressed a long time, several years, have good work job, get along with people, good with family, don't know why I feel so crabby.  Have self hatred thoughts, good childhood."  Therapist is Dr. Toy Care, which she saw this morning, started crying, very depressed, therapist wanted her to go to [facility] for evaluation.  Denied surgeries, no health problems.   Fall risk information reviewed and given to patient, low fall risk.  Patient has been cooperative and pleasant. Patient oriented to unit and given food/drink. Locker [address] has one blue hoodie/jacket with hood.

## 2015-04-17 LAB — HEMOGLOBIN A1C
Hgb A1c MFr Bld: 5.7 % — ABNORMAL HIGH (ref 4.8–5.6)
Mean Plasma Glucose: 117 mg/dL

## 2015-04-17 MED ORDER — BUPROPION HCL ER (XL) 150 MG PO TB24
150.0000 mg | ORAL_TABLET | Freq: Every day | ORAL | Status: DC
  Administered 2015-04-17 – 2015-04-19 (×3): 150 mg via ORAL
  Filled 2015-04-17 (×5): qty 1

## 2015-04-17 NOTE — Progress Notes (Signed)
Pt stated that she had a good day. Her goal was to do some writing and she was able to do that. Her goal is tomorrow is to start working on discharge planning.

## 2015-04-17 NOTE — BHH Group Notes (Signed)
[facility] LCSW Group Therapy 04/17/2015 1:15pm  Type of Therapy: Group Therapy- Feelings Around Relapse and Recovery  Participation Level: Active   Participation Quality:  Appropriate  Affect:  Appropriate  Cognitive: Alert and Oriented   Insight:  Developing   Engagement in Therapy: Developing/Improving and Engaged   Modes of Intervention: Clarification, Confrontation, Discussion, Education, Exploration, Limit-setting, Orientation, Problem-solving, Rapport Building, Art therapist, Socialization and Support  Summary of Progress/Problems: The topic for today was feelings about relapse. The group discussed what relapse prevention is to them and identified triggers that they are on the path to relapse. Members also processed their feeling towards relapse and were able to relate to common experiences. Group also discussed coping skills that can be used for relapse prevention.  Pt was reserved in group but participated when prompted. Pt identified isolation as a warning sign and reports that she is motivated to find someone to keep her accountable regarding socialization.   Therapeutic Modalities:   Cognitive Behavioral Therapy Solution-Focused Therapy Assertiveness Training Relapse Prevention Therapy    Norman Clay [phone] 04/17/2015 4:47 PM

## 2015-04-17 NOTE — BHH Group Notes (Signed)
[facility] LCSW Aftercare Discharge Planning Group Note  04/17/2015 8:45 AM  Pt did not attend, declined invitation.   Peri Maris, [place] 04/17/2015 9:23 AM

## 2015-04-17 NOTE — Progress Notes (Signed)
Recreation Therapy Notes  Date: 10.07.2016 Time: 9:30am Location: [address] Group room   Group Topic: Stress Management  Goal Area(s) Addresses:  Patient will actively participate in stress management techniques presented during session.   Behavioral Response: Did not attend.   Laureen Ochs [place], LRT/CTRS        [place], Denise L 04/17/2015 10:03 AM

## 2015-04-17 NOTE — BHH Suicide Risk Assessment (Signed)
[place] INPATIENT:  Family/Significant Other Suicide Prevention Education  Suicide Prevention Education:  Education Completed; Breea Loncar, Pt's father [phone]),  has been identified by the patient as the family member/significant other with whom the patient will be residing, and identified as the person(s) who will aid the patient in the event of a mental health crisis (suicidal ideations/suicide attempt).  With written consent from the patient, the family member/significant other has been provided the following suicide prevention education, prior to the and/or following the discharge of the patient.  The suicide prevention education provided includes the following:  Suicide risk factors  Suicide prevention and interventions  National Suicide Hotline telephone number  [facility] assessment telephone number  [facility] Emergency Assistance [facility] and/or Residential Mobile Crisis Unit telephone number  Request made of family/significant other to:  Remove weapons (e.g., guns, rifles, knives), all items previously/currently identified as safety concern.    Remove drugs/medications (over-the-counter, prescriptions, illicit drugs), all items previously/currently identified as a safety concern.  The family member/significant other verbalizes understanding of the suicide prevention education information provided.  The family member/significant other agrees to remove the items of safety concern listed above.  Bo Mcclintock 04/17/2015, 11:42 AM

## 2015-04-17 NOTE — Progress Notes (Signed)
D.  Pt pleasant on approach, no complaints voiced.  Positive for evening wrap up group, interacting appropriately with peers on the unit.  Denies SI/HI/hallucinations at this time . Feels ready for discharge.  A.  Support and encouragement offered  R.  Pt remains safe on unit, will continue to monitor.

## 2015-04-17 NOTE — Progress Notes (Addendum)
[facility] MD Progress Note  04/17/2015 4:10 PM Monica Ortega  MRN:  [id number] Subjective:   Patient reports she is starting to feel better. She reports history of depression, anxiety ( social anxiety symptoms) . She states that she is hoping to discharge soon, and wants to continue psychiatric treatment on an outpatient basis. Denies medication side effects at this time, but feels that they are not working and that [place] has been causing her to be excessively sedated, due to which she had tapered down dose  Objective : I have discussed case with treatment team and have met with patient. She is a 24 year old single female, lives with father, is employed. Reports chronic, worsening depression.  No psychotic symptoms. At this time denies any suicidal ideations, and is hoping to return to her home, regular daily activities, work soon. As noted, by staff, patient submitted 72 hour letter requesting discharge yesterday evening . She states " I think I really do not need to be inpatient, I would do fine with outpatient treatment". No disruptive or agitated behaviors on unit, going to groups .  Principal Problem: MDD (major depressive disorder), recurrent severe, without psychosis ([place]) Diagnosis:   Patient Active Problem List   Diagnosis Date Noted  . MDD (major depressive disorder), recurrent severe, without psychosis ([place]) [F33.2] 04/16/2015   Total Time spent with patient: 25 minutes    Past Medical History:  Past Medical History  Diagnosis Date  . Depression     denies hospitalization or medication  . Ankle sprain   . Anxiety     Past Surgical History  Procedure Laterality Date  . Tonsillectomy    . Wisdom tooth extraction     Family History:  Family History  Problem Relation Age of Onset  . Diabetes Mother   . Hypertension Father    Social History:  History  Alcohol Use No    Comment: denied alcohol     History  Drug Use No    Comment: denied    Social History   Social  History  . Marital Status: Unknown    Spouse Name: N/A  . Number of Children: N/A  . Years of Education: N/A   Social History Main Topics  . Smoking status: Never Smoker   . Smokeless tobacco: None  . Alcohol Use: No     Comment: denied alcohol  . Drug Use: No     Comment: denied  . Sexual Activity: Yes    Birth Control/ Protection: Pill   Other Topics Concern  . None   Social History Narrative   Work or Presenter, broadcasting Situation: lives with father and sister      Spiritual Beliefs: none, atheist      Lifestyle: 2-4 times per week jogging or elliptical or bike; diet is so so            Additional Social History:    Pain Medications: denied  Prescriptions: lexapro 10 mg   fish oil   probiotic occasionally Over the Counter: fish oil   probiotic occasionally History of alcohol / drug use?: No history of alcohol / drug abuse Longest period of sobriety (when/how long): none Negative Consequences of Use: Work / Youth worker Withdrawal Symptoms: Other (Comment) (denied alcohol/tobacco/drug abuse)  Sleep: improved  Appetite:  Fair  Current Medications: Current Facility-Administered Medications  Medication Dose Route Frequency Provider Last Rate Last Dose  . acetaminophen (TYLENOL) tablet 650 mg  650 mg Oral Q6H PRN  Kerrie Buffalo, NP      . alum & mag hydroxide-simeth (MAALOX/MYLANTA) 200-200-20 MG/5ML suspension 30 mL  30 mL Oral Q4H PRN Kerrie Buffalo, NP      . buPROPion (WELLBUTRIN XL) 24 hr tablet 150 mg  150 mg Oral Daily Myer Peer Cobos, MD      . Influenza vac split quadrivalent PF (FLUARIX) injection 0.5 mL  0.5 mL Intramuscular Tomorrow-1000 Myer Peer Cobos, MD      . magnesium hydroxide (MILK OF MAGNESIA) suspension 30 mL  30 mL Oral Daily PRN Kerrie Buffalo, NP      . traZODone (DESYREL) tablet 50 mg  50 mg Oral QHS PRN Kerrie Buffalo, NP   50 mg at 04/16/15 2252    Lab Results:  Results for orders placed or performed during the hospital  encounter of 04/16/15 (from the past 48 hour(s))  Urine rapid drug screen (hosp performed)not at [facility]     Status: None   Collection Time: 04/16/15  6:00 PM  Result Value Ref Range   Opiates NONE DETECTED NONE DETECTED   Cocaine NONE DETECTED NONE DETECTED   Benzodiazepines NONE DETECTED NONE DETECTED   Amphetamines NONE DETECTED NONE DETECTED   Tetrahydrocannabinol NONE DETECTED NONE DETECTED   Barbiturates NONE DETECTED NONE DETECTED    Comment:        DRUG SCREEN FOR MEDICAL PURPOSES ONLY.  IF CONFIRMATION IS NEEDED FOR ANY PURPOSE, NOTIFY LAB WITHIN 5 DAYS.        LOWEST DETECTABLE LIMITS FOR URINE DRUG SCREEN Drug Class       Cutoff (ng/mL) Amphetamine      1000 Barbiturate      200 Benzodiazepine   160 Tricyclics       737 Opiates          300 Cocaine          300 THC              50 Performed at [facility]   Pregnancy, urine     Status: None   Collection Time: 04/16/15  6:00 PM  Result Value Ref Range   Preg Test, Ur NEGATIVE NEGATIVE    Comment:        THE SENSITIVITY OF THIS METHODOLOGY IS >20 mIU/mL. Performed at [facility]   CBC     Status: None   Collection Time: 04/16/15  6:20 PM  Result Value Ref Range   WBC 6.1 4.0 - 10.5 K/uL   RBC 4.71 3.87 - 5.11 MIL/uL   Hemoglobin 13.7 12.0 - 15.0 g/dL   HCT 41.4 36.0 - 46.0 %   MCV 87.9 78.0 - 100.0 fL   MCH 29.1 26.0 - 34.0 pg   MCHC 33.1 30.0 - 36.0 g/dL   RDW 12.2 11.5 - 15.5 %   Platelets 255 150 - 400 K/uL    Comment: Performed at [facility]  Comprehensive metabolic panel     Status: Abnormal   Collection Time: 04/16/15  6:20 PM  Result Value Ref Range   Sodium 137 135 - 145 mmol/L   Potassium 4.4 3.5 - 5.1 mmol/L   Chloride 104 101 - 111 mmol/L   CO2 26 22 - 32 mmol/L   Glucose, Bld 118 (H) 65 - 99 mg/dL   BUN 8 6 - 20 mg/dL   Creatinine, Ser 0.74 0.44 - 1.00 mg/dL   Calcium 8.8 (L) 8.9 - 10.3 mg/dL   Total Protein 7.1 6.5 - 8.1  g/dL    Albumin 3.7 3.5 - 5.0 g/dL   AST 23 15 - 41 U/L   ALT 18 14 - 54 U/L   Alkaline Phosphatase 45 38 - 126 U/L   Total Bilirubin 0.4 0.3 - 1.2 mg/dL   GFR calc non Af Amer >60 >60 mL/min   GFR calc Af Amer >60 >60 mL/min    Comment: (NOTE) The eGFR has been calculated using the CKD EPI equation. This calculation has not been validated in all clinical situations. eGFR's persistently <60 mL/min signify possible Chronic Kidney Disease.    Anion gap 7 5 - 15    Comment: Performed at [facility]  Hemoglobin A1c     Status: Abnormal   Collection Time: 04/16/15  6:20 PM  Result Value Ref Range   Hgb A1c MFr Bld 5.7 (H) 4.8 - 5.6 %    Comment: (NOTE)         Pre-diabetes: 5.7 - 6.4         Diabetes: >6.4         Glycemic control for adults with diabetes: <7.0    Mean Plasma Glucose 117 mg/dL    Comment: (NOTE) Performed At: [facility] [place], [place] [id number] Lindon Romp MD [id number] Performed at [facility]   TSH     Status: None   Collection Time: 04/16/15  6:20 PM  Result Value Ref Range   TSH 2.356 0.350 - 4.500 uIU/mL    Comment: Performed at [facility]    Physical Findings: AIMS: Facial and Oral Movements Muscles of Facial Expression: None, normal Lips and Perioral Area: None, normal Jaw: None, normal Tongue: None, normal,Extremity Movements Upper (arms, wrists, hands, fingers): None, normal Lower (legs, knees, ankles, toes): None, normal, Trunk Movements Neck, shoulders, hips: None, normal, Overall Severity Severity of abnormal movements (highest score from questions above): None, normal Incapacitation due to abnormal movements: None, normal Patient's awareness of abnormal movements (rate only patient's report): No Awareness, Dental Status Current problems with teeth and/or dentures?: No Does patient usually wear dentures?: No  CIWA:  CIWA-Ar Total: 1 COWS:  COWS Total  Score: 1  Musculoskeletal: Strength & Muscle Tone: within normal limits Gait & Station: normal Patient leans: N/A  Psychiatric Specialty Exam: ROS no chest pain, no shortness of  Breath, no nausea, no vomiting   Blood pressure 103/61, pulse 92, temperature 98.6 F (37 C), temperature source Oral, resp. rate 18, height _0  (1.6 m), weight 141 lb (63.957 kg), last menstrual period 03/26/2015, SpO2 100 %.Body mass index is 24.98 kg/(m^2).  General Appearance: Well Groomed  Engineer, water::  Good  Speech:  Normal Rate  Volume:  Normal  Mood:  Depressed- but states she is feeling better   Affect:  Constricted and but reactive, and does smile at times appropriately  Thought Process:  Goal Directed and Linear  Orientation:  Full (Time, Place, and Person)  Thought Content:  denies hallucinations, no delusions, not internally preoccupied   Suicidal Thoughts:  No- at this time denies any suicidal ideations, and denies any thoughts of hurting self, contracts for safety  Homicidal Thoughts:  No  Memory:  recent and remote grossly intact   Judgement:  Fair  Insight:  Present  Psychomotor Activity:  Normal  Concentration:  Good  Recall:  Good  Fund of Knowledge:Good  Language: Good  Akathisia:  Negative  Handed:  Right  AIMS (if indicated):  Assets:  Communication Skills Desire for Improvement Housing Physical Health Resilience Social Support  ADL's:  Intact  Cognition: WNL  Sleep:     Assessment - patient reports chronic, currently worsened ,  depression and  To a lesser degree also describes some social anxiety. She denies and does not present with psychosis and does not endorse bipolar disorder/ history of mania. At this time reports feeling better, and is wanting to discharge soon. Does want to establish and continue outpatient psychiatric treatment . She  Has been treated with lexapro, but states that this medication has been poorly tolerated due to excessive sedation / fatigue. We  discussed other treatment options and she agrees to Wellbutrin XL trial, which has less likelihood of sedation . We reviewed medication side effects. Of note, patient has no history of seizure disorder, head traumas, or eating disorder.  Treatment Plan Summary: Daily contact with patient to assess and evaluate symptoms and progress in treatment, Medication management, Plan inpatient admission and medication management as below  Start Wellbutrin XL 150 mgrs QAM to address depression D/C Lexapro due to side effects- see above . D/C low dose Zoloft ( started yesterday) - initiate Buproprion trial as above . Continue Trazodone 50 mgrs QHS PRN for insomnia as needed  Encourage group, milieu participation to work on symptom reduction and coping skills. As noted, patient has submitted letter ( 72 hour letter ) requesting discharge , but is agreeing to stay at this time for ongoing monitoring of mood and of new antidepressant trial.  COBOS, FERNANDO 04/17/2015, 4:10 PM

## 2015-04-17 NOTE — Progress Notes (Signed)
D) Pt has been attending the groups and interacting with her peers. Affect is flat and mood depressed. Interacting with her peers appropriately. Denies SI and HI presently. A)  Given support, reassurance and praise along with encouragement. Provided with a brief 1:1 R) Denies SI and HI.

## 2015-04-18 DIAGNOSIS — F332 Major depressive disorder, recurrent severe without psychotic features: Principal | ICD-10-CM

## 2015-04-18 NOTE — BHH Group Notes (Signed)
[place] Group Notes:  (Clinical Social Work)  04/18/2015     1:15-2:15PM  Summary of Progress/Problems:   The main focus of today's process group was to learn how to use a decisional balance exercise to move forward in the Stages of Change.  Patients listed needs on the whiteboard and unhealthy coping techniques often used to fill needs.  Motivational Interviewing and the whiteboard were utilized to help patients explore in depth the perceived benefits and costs of unhealthy coping techniques, as well as the  benefits and costs of replacing that with a healthy coping skills.   The patient expressed some connection to other patients' statements although did not initiate them herself.  We discussed coping techniques to "live in the moment" , i.e., mindfulness, and CSW gave examples and homework.  We also talked at length about filtering negative self-talk and how to deal with people making [place] angry, and emphasis was placed on finding a therapist to pursue the learning of these skills outside of hospital.  Type of Therapy:  Group Therapy - Process   Participation Level:  Active  Participation Quality:  Attentive and Sharing  Affect:  Blunted and Depressed  Cognitive:  Alert  Insight:  Engaged  Engagement in Therapy:  Engaged  Modes of Intervention:  Education, Motivational Interviewing  Selmer Dominion, LCSW 04/18/2015, 4:02 PM

## 2015-04-18 NOTE — Progress Notes (Signed)
D.  Pt pleasant on approach, denies complaints at this time.  Pt did not wish to resend the 72 hour request for discharge that she signed on 04/16/15 at 1700.  This will be up tomorrow at 1700.  Pt states "I want to go home tomorrow".  Pt denies SI/HI/hallucinations at this time.  Pt was positive for evening wrap up group and has been interacting appropriately with peers within the milieu.  A.  Support and encouragement offered  R.  Pt remains safe on the unit, will continue to monitor.

## 2015-04-18 NOTE — Progress Notes (Signed)
D: Patient stated she slept poor last night and stated she had a headache that was rated at a 3. She requested sleep medication but it did not help. She rated her depression at a 1, her hopelessness at a 0, and her anxiety at a 0. She denies SI/HI/AVH. She stated her goal was to stay active and work on her discharge plan. She said she would do this by pacing herself and asking for help if she was in trouble.  A: Medication was given and explained. Pain medication was given for HA. Pain was evaluated. Support was given. R: Patient went to groups and was cooperative.

## 2015-04-18 NOTE — Progress Notes (Signed)
Pt stated that she had a good day. One positive coping skill she has is to use the "mindfulness technique" that she learned today. Upon discharge, she wants to learn how to be more positive so that she doesn't end up in the same situation again.

## 2015-04-18 NOTE — Plan of Care (Signed)
Problem: Alteration in mood Goal: LTG-Patient reports reduction in suicidal thoughts (Patient reports reduction in suicidal thoughts and is able to verbalize a safety plan for whenever patient is feeling suicidal)  Patient has denied SI

## 2015-04-18 NOTE — Plan of Care (Signed)
Problem: Alteration in mood Goal: STG-Patient is able to discuss feelings and issues (Patient is able to discuss feelings and issues leading to depression)  Outcome: Progressing Patient verbalizes feelings and participated in group.

## 2015-04-18 NOTE — Progress Notes (Signed)
.  Psychoeducational Group Note    Date: 04/18/2015 Time:  0930    Goal Setting Purpose of Group: To be able to set a goal that is measurable and that can be accomplished in one day Participation Level:  Active  Participation Quality:  Appropriate  Affect:  Appropriate  Cognitive:  Oriented  Insight:  Improving  Engagement in Group:  Engaged  Additional Comments:  Pt attended the group and participated.  Paulino Rily

## 2015-04-18 NOTE — Progress Notes (Signed)
[facility] MD Progress Note  04/18/2015 11:06 AM Monica Ortega  MRN:  [id number] Subjective:   Patient reports she is doing pretty good. "My mood is better. I feel more motivated to look at the good things in life. My family visits have turned out to be very good and that has helped me. I was a different person that I didn't want to be. " She reports history of depression, anxiety ( social anxiety symptoms). She states that she is hoping to discharge soon, But glad hat she stayed because she feels so much better now. Currently rates her depression 1-2/10, anxiety 0/10 and Hopelessness 0/10.  Denies medication side effects at this time, but feels that they are not working and that [facility] has been causing her to be excessively sedated, due to which she had tapered down dose   Objective : I have discussed case with treatment team and have met with patient. She is a 24 year old single female, lives with father, is employed. Reports chronic, worsening depression that is improving. At this time denies any suicidal ideations, and is hoping to return to her home, regular daily activities, work soon.As noted, by staff, patient submitted 72 hour letter requesting discharge yesterday evening . She states " I think I really do not need to be inpatient, I would do fine with outpatient treatment". No disruptive or agitated behaviors on unit, going to groups .  Principal Problem: MDD (major depressive disorder), recurrent severe, without psychosis ([place]) Diagnosis:   Patient Active Problem List   Diagnosis Date Noted  . MDD (major depressive disorder), recurrent severe, without psychosis ([place]) [F33.2] 04/16/2015   Total Time spent with patient: 25 minutes    Past Medical History:  Past Medical History  Diagnosis Date  . Depression     denies hospitalization or medication  . Ankle sprain   . Anxiety     Past Surgical History  Procedure Laterality Date  . Tonsillectomy    . Wisdom tooth extraction     Family  History:  Family History  Problem Relation Age of Onset  . Diabetes Mother   . Hypertension Father    Social History:  History  Alcohol Use No    Comment: denied alcohol     History  Drug Use No    Comment: denied    Social History   Social History  . Marital Status: Unknown    Spouse Name: N/A  . Number of Children: N/A  . Years of Education: N/A   Social History Main Topics  . Smoking status: Never Smoker   . Smokeless tobacco: None  . Alcohol Use: No     Comment: denied alcohol  . Drug Use: No     Comment: denied  . Sexual Activity: Yes    Birth Control/ Protection: Pill   Other Topics Concern  . None   Social History Narrative   Work or Presenter, broadcasting Situation: lives with father and sister      Spiritual Beliefs: none, atheist      Lifestyle: 2-4 times per week jogging or elliptical or bike; diet is so so            Additional Social History:    Pain Medications: denied  Prescriptions: lexapro 10 mg   fish oil   probiotic occasionally Over the Counter: fish oil   probiotic occasionally History of alcohol / drug use?: No history of alcohol / drug abuse Longest period of sobriety (  when/how long): none Negative Consequences of Use: Work / School Withdrawal Symptoms: Other (Comment) (denied alcohol/tobacco/drug abuse)  Sleep: Fair  Appetite:  Fair  Current Medications: Current Facility-Administered Medications  Medication Dose Route Frequency Provider Last Rate Last Dose  . acetaminophen (TYLENOL) tablet 650 mg  650 mg Oral Q6H PRN Kerrie Buffalo, NP   650 mg at 04/18/15 0801  . alum & mag hydroxide-simeth (MAALOX/MYLANTA) 200-200-20 MG/5ML suspension 30 mL  30 mL Oral Q4H PRN Kerrie Buffalo, NP      . buPROPion (WELLBUTRIN XL) 24 hr tablet 150 mg  150 mg Oral Daily Jenne Campus, MD   150 mg at 04/18/15 0801  . magnesium hydroxide (MILK OF MAGNESIA) suspension 30 mL  30 mL Oral Daily PRN Kerrie Buffalo, NP      . traZODone  (DESYREL) tablet 50 mg  50 mg Oral QHS PRN Kerrie Buffalo, NP   50 mg at 04/17/15 2238    Lab Results:  Results for orders placed or performed during the hospital encounter of 04/16/15 (from the past 48 hour(s))  Urine rapid drug screen (hosp performed)not at [facility]     Status: None   Collection Time: 04/16/15  6:00 PM  Result Value Ref Range   Opiates NONE DETECTED NONE DETECTED   Cocaine NONE DETECTED NONE DETECTED   Benzodiazepines NONE DETECTED NONE DETECTED   Amphetamines NONE DETECTED NONE DETECTED   Tetrahydrocannabinol NONE DETECTED NONE DETECTED   Barbiturates NONE DETECTED NONE DETECTED    Comment:        DRUG SCREEN FOR MEDICAL PURPOSES ONLY.  IF CONFIRMATION IS NEEDED FOR ANY PURPOSE, NOTIFY LAB WITHIN 5 DAYS.        LOWEST DETECTABLE LIMITS FOR URINE DRUG SCREEN Drug Class       Cutoff (ng/mL) Amphetamine      1000 Barbiturate      200 Benzodiazepine   893 Tricyclics       810 Opiates          300 Cocaine          300 THC              50 Performed at [facility]   Pregnancy, urine     Status: None   Collection Time: 04/16/15  6:00 PM  Result Value Ref Range   Preg Test, Ur NEGATIVE NEGATIVE    Comment:        THE SENSITIVITY OF THIS METHODOLOGY IS >20 mIU/mL. Performed at [facility]   CBC     Status: None   Collection Time: 04/16/15  6:20 PM  Result Value Ref Range   WBC 6.1 4.0 - 10.5 K/uL   RBC 4.71 3.87 - 5.11 MIL/uL   Hemoglobin 13.7 12.0 - 15.0 g/dL   HCT 41.4 36.0 - 46.0 %   MCV 87.9 78.0 - 100.0 fL   MCH 29.1 26.0 - 34.0 pg   MCHC 33.1 30.0 - 36.0 g/dL   RDW 12.2 11.5 - 15.5 %   Platelets 255 150 - 400 K/uL    Comment: Performed at [facility]  Comprehensive metabolic panel     Status: Abnormal   Collection Time: 04/16/15  6:20 PM  Result Value Ref Range   Sodium 137 135 - 145 mmol/L   Potassium 4.4 3.5 - 5.1 mmol/L   Chloride 104 101 - 111 mmol/L   CO2 26 22 - 32 mmol/L   Glucose,  Bld 118 (H) 65 -  99 mg/dL   BUN 8 6 - 20 mg/dL   Creatinine, Ser 0.74 0.44 - 1.00 mg/dL   Calcium 8.8 (L) 8.9 - 10.3 mg/dL   Total Protein 7.1 6.5 - 8.1 g/dL   Albumin 3.7 3.5 - 5.0 g/dL   AST 23 15 - 41 U/L   ALT 18 14 - 54 U/L   Alkaline Phosphatase 45 38 - 126 U/L   Total Bilirubin 0.4 0.3 - 1.2 mg/dL   GFR calc non Af Amer >60 >60 mL/min   GFR calc Af Amer >60 >60 mL/min    Comment: (NOTE) The eGFR has been calculated using the CKD EPI equation. This calculation has not been validated in all clinical situations. eGFR's persistently <60 mL/min signify possible Chronic Kidney Disease.    Anion gap 7 5 - 15    Comment: Performed at [facility]  Hemoglobin A1c     Status: Abnormal   Collection Time: 04/16/15  6:20 PM  Result Value Ref Range   Hgb A1c MFr Bld 5.7 (H) 4.8 - 5.6 %    Comment: (NOTE)         Pre-diabetes: 5.7 - 6.4         Diabetes: >6.4         Glycemic control for adults with diabetes: <7.0    Mean Plasma Glucose 117 mg/dL    Comment: (NOTE) Performed At: [facility] [place], [place] [id number] Lindon Romp MD [id number] Performed at [facility]   TSH     Status: None   Collection Time: 04/16/15  6:20 PM  Result Value Ref Range   TSH 2.356 0.350 - 4.500 uIU/mL    Comment: Performed at [facility]    Physical Findings: AIMS: Facial and Oral Movements Muscles of Facial Expression: None, normal Lips and Perioral Area: None, normal Jaw: None, normal Tongue: None, normal,Extremity Movements Upper (arms, wrists, hands, fingers): None, normal Lower (legs, knees, ankles, toes): None, normal, Trunk Movements Neck, shoulders, hips: None, normal, Overall Severity Severity of abnormal movements (highest score from questions above): None, normal Incapacitation due to abnormal movements: None, normal Patient's awareness of abnormal movements (rate only patient's report): No  Awareness, Dental Status Current problems with teeth and/or dentures?: No Does patient usually wear dentures?: No  CIWA:  CIWA-Ar Total: 0 COWS:  COWS Total Score: 1  Musculoskeletal: Strength & Muscle Tone: within normal limits Gait & Station: normal Patient leans: N/A  Psychiatric Specialty Exam: ROS  no chest pain, no shortness of  Breath, no nausea, no vomiting   Blood pressure 105/73, pulse 102, temperature 97.9 F (36.6 C), temperature source Oral, resp. rate 16, height 5' 3"  (1.6 m), weight 63.957 kg (141 lb), last menstrual period 03/26/2015, SpO2 100 %.Body mass index is 24.98 kg/(m^2).  General Appearance: Well Groomed  Engineer, water::  Minimal  Speech:  Normal Rate  Volume:  Normal  Mood:  Depressed- but states she is feeling better   Affect:  Constricted and Flat  Thought Process:  Goal Directed and Linear  Orientation:  Full (Time, Place, and Person)  Thought Content:  denies hallucinations, no delusions, not internally preoccupied   Suicidal Thoughts:  No- at this time denies any suicidal ideations, and denies any thoughts of hurting self, contracts for safety  Homicidal Thoughts:  No  Memory:  recent and remote grossly intact   Judgement:  Fair  Insight:  Present  Psychomotor Activity:  Normal  Concentration:  Good  Recall:  Good  Fund of Knowledge:Good  Language: Good  Akathisia:  Negative  Handed:  Right  AIMS (if indicated):     Assets:  Communication Skills Desire for Improvement Housing Physical Health Resilience Social Support  ADL's:  Intact  Cognition: WNL  Sleep:     Assessment - patient reports chronic, currently worsened ,  depression and  To a lesser degree also describes some social anxiety. She denies and does not present with psychosis and does not endorse bipolar disorder/ history of mania. At this time reports feeling better, and is wanting to discharge soon. Does want to establish and continue outpatient psychiatric treatment . She  Has  been treated with lexapro, but states that this medication has been poorly tolerated due to excessive sedation / fatigue. We discussed other treatment options and she agrees to Wellbutrin XL trial, which has less likelihood of sedation . We reviewed medication side effects. Of note, patient has no history of seizure disorder, head traumas, or eating disorder.  Treatment Plan Summary: Daily contact with patient to assess and evaluate symptoms and progress in treatment, Medication management, Plan inpatient admission and medication management as below  Start Wellbutrin XL 150 mgrs QAM to address depression D/C Lexapro due to side effects- see above . D/C low dose Zoloft ( started yesterday) - initiate Buproprion trial as above . Continue Trazodone 50 mgrs QHS PRN for insomnia as needed  Encourage group, milieu participation to work on symptom reduction and coping skills. As noted, patient has submitted letter ( 72 hour letter ) requesting discharge , but is agreeing to stay at this time for ongoing monitoring of mood and of new antidepressant trial.  Priscille Loveless S FNP-BC 04/18/2015, 11:06 AM

## 2015-04-19 ENCOUNTER — Encounter (HOSPITAL_COMMUNITY): Payer: Self-pay | Admitting: Psychiatry

## 2015-04-19 MED ORDER — BUPROPION HCL ER (XL) 150 MG PO TB24: 150.0000 mg | ORAL_TABLET | Freq: Every day | ORAL | Status: DC

## 2015-04-19 MED ORDER — TRAZODONE HCL 50 MG PO TABS: 50.0000 mg | ORAL_TABLET | Freq: Every evening | ORAL | Status: DC | PRN

## 2015-04-19 NOTE — [facility] Group Notes (Signed)
[facility] Group Notes:  (Nursing/MHT/Case Management/Adjunct)  Date:  04/19/2015  Time:  1045  Type of Therapy:  Nurse Education /  Life SKills : The focus of his group is on teaching patients the importance of having and maintaining a healthy support system .  Participation Level:  Active  Participation Quality:  Attentive  Affect:  Anxious  Cognitive:  Alert  Insight:  Appropriate  Engagement in Group:  Engaged  Modes of Intervention:  Education  Summary of Progress/Problems:  Lauralyn Primes 04/19/2015, 6:38 PM

## 2015-04-19 NOTE — [facility] Suicide Risk Assessment (Signed)
[facility] Discharge Suicide Risk Assessment   Demographic Factors:  Caucasian  Total Time spent with patient: 30 minutes  Musculoskeletal: Strength & Muscle Tone: within normal limits Gait & Station: normal Patient leans: N/A  Psychiatric Specialty Exam: Physical Exam  Review of Systems  Psychiatric/Behavioral: Positive for depression (STABLE).  All other systems reviewed and are negative.   Blood pressure 99/52, pulse 119, temperature 97.6 F (36.4 C), temperature source Oral, resp. rate 16, height 5' 3"  (1.6 m), weight 63.957 kg (141 lb), last menstrual period 03/26/2015, SpO2 100 %.Body mass index is 24.98 kg/(m^2).  General Appearance: Casual  Eye Contact::  Fair  Speech:  Clear and [id number]  Volume:  Normal  Mood:  Depressed STABLE  Affect:  Appropriate  Thought Process:  Coherent  Orientation:  Full (Time, Place, and Person)  Thought Content:  WDL  Suicidal Thoughts:  No  Homicidal Thoughts:  No  Memory:  Immediate;   Fair Recent;   Fair Remote;   Fair  Judgement:  Fair  Insight:  Fair  Psychomotor Activity:  Normal  Concentration:  Fair  Recall:  [employer] [place]  Language: Fair  Akathisia:  No  Handed:  Right  AIMS (if indicated):     Assets:  Communication Skills Desire for [place] Talents/Skills Transportation Vocational/Educational  Sleep:  Number of Hours: 6.5  Cognition: WNL  ADL's:  Intact   Have you used any form of tobacco in the last 30 days? (Cigarettes, Smokeless Tobacco, Cigars, and/or Pipes): No  Has this patient used any form of tobacco in the last 30 days? (Cigarettes, Smokeless Tobacco, Cigars, and/or Pipes) No  Mental Status Per Nursing Assessment::   On Admission:  NA  Current Mental Status by Physician: Pt denies SI/HI/AH/VH. Pt has improved mood.  Loss Factors: NA  Historical Factors: NA  Risk Reduction Factors:   Employed, Living with another person, especially a  relative, Positive social support, Positive therapeutic relationship and Positive coping skills or problem solving skills  Continued Clinical Symptoms:  Depression:  Continue treatment on an out patient basis.  Cognitive Features That Contribute To Risk:  None    Suicide Risk:  Minimal: No identifiable suicidal ideation.  Patients presenting with no risk factors but with morbid ruminations; may be classified as minimal risk based on the severity of the depressive symptoms  Principal Problem: MDD (major depressive disorder), recurrent severe, without psychosis [facility]) Discharge Diagnoses:  Patient Active Problem List   Diagnosis Date Noted  . MDD (major depressive disorder), recurrent severe, without psychosis ([place]) [F33.2] 04/16/2015    Follow-up Information    Follow up with [facility].   Specialty:  Behavioral Health   Why:  Voicemail was left on 10/7 requesting appointment. Please call Dr. Glennon Hamilton at the number below to schedule your next appointment.   Contact information:   [place] [phone]      Follow up with [facility].   Specialty:  Behavioral Health   Why:  Social gave insurance information and demographics to staff at [employer]. They will call Monday with your appointment time which will be within the next two weeks.   Contact information:   [place] [address] [phone]       Plan Of Care/Follow-up recommendations:  Activity:  no restrictions Diet:  regular Tests:  as needed Other:  follow up with after care  Is patient on multiple  antipsychotic therapies at discharge:  No   Has Patient had three or more failed trials of antipsychotic monotherapy by history:  No  Recommended Plan for Multiple Antipsychotic Therapies: NA    Eappen,Saramma MD 04/19/2015, 8:33 AM

## 2015-04-19 NOTE — Discharge Summary (Signed)
Physician Discharge Summary Note  Patient:  Monica Ortega is an 24 y.o., female MRN:  [id number] DOB:  [birth year] Patient phone:  [phone] (home)  Patient address:   [address] [place] [address],  Total Time spent with patient: 30 minutes  Date of Admission:  04/16/2015 Date of Discharge: 04/19/2015  Reason for Admission:  Monica Ortega is a 7 y old CF with hx of depression , is single , lives with her dad, is employed , works at an [employer] . Pt presented with [place] , had thoughts to harm self with a knife. Pt reports that she currently lives with dad. Her mom and dad have been divorced. Pt also has a sister who is older , who lives with her. Pt is a Writer, has bachelor degree from [employer] Pt has a hx of depression. Pt was being treated for depression - was on Celexa, she was having side effects and has been trying to taper self off of it . Pt has been taking only 1/2 -1/4 pill. However pt reported having worsening SI recently.Pt also sees a therapist -Dr.Bray , at [facility] , [place].  Principal Problem: MDD (major depressive disorder), recurrent severe, without psychosis [facility]) Discharge Diagnoses: Patient Active Problem List   Diagnosis Date Noted  . MDD (major depressive disorder), recurrent severe, without psychosis ([place]) [F33.2] 04/16/2015    Musculoskeletal: Strength & Muscle Tone: within normal limits Gait & Station: normal Patient leans: N/A  Psychiatric Specialty Exam:SEE MD SRA  Physical Exam  Constitutional: She is oriented to person, place, and time. She appears well-developed.  HENT:  Head: Normocephalic.  Eyes: Pupils are equal, round, and reactive to light.  Neck: Normal range of motion.  Cardiovascular: Normal rate.   Musculoskeletal: Normal range of motion.  Neurological: She is alert and oriented to person, place, and time.  Skin: Skin is warm and dry.  Psychiatric: She has a normal mood and affect. Her behavior is normal.  Judgment and thought content normal.    Review of Systems  Psychiatric/Behavioral: Positive for depression. Negative for suicidal ideas, hallucinations, memory loss and substance abuse. The patient is not nervous/anxious and does not have insomnia.     Blood pressure 99/52, pulse 119, temperature 97.6 F (36.4 C), temperature source Oral, resp. rate 16, height _0  (1.6 m), weight 63.957 kg (141 lb), last menstrual period 03/26/2015, SpO2 100 %.Body mass index is 24.98 kg/(m^2).   Have you used any form of tobacco in the last 30 days? (Cigarettes, Smokeless Tobacco, Cigars, and/or Pipes): No  Has this patient used any form of tobacco in the last 30 days? (Cigarettes, Smokeless Tobacco, Cigars, and/or Pipes) No  Past Medical History:  Past Medical History  Diagnosis Date  . Depression     denies hospitalization or medication  . Ankle sprain   . Anxiety     Past Surgical History  Procedure Laterality Date  . Tonsillectomy    . Wisdom tooth extraction     Family History:  Family History  Problem Relation Age of Onset  . Diabetes Mother   . Hypertension Father    Social History:  History  Alcohol Use No    Comment: denied alcohol     History  Drug Use No    Comment: denied    Social History   Social History  . Marital Status: Unknown    Spouse Name: N/A  . Number of Children: N/A  . Years of Education: N/A  Social History Main Topics  . Smoking status: Never Smoker   . Smokeless tobacco: None  . Alcohol Use: No     Comment: denied alcohol  . Drug Use: No     Comment: denied  . Sexual Activity: Yes    Birth Control/ Protection: Pill   Other Topics Concern  . 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 Glennon Hamilton  Substance Abuse Care: None  Self-Mutilation: None  Suicidal Attempts: None  Violent Behaviors:None   Risk to Self: Suicidal Ideation: Yes-Currently Present Suicidal Intent: Yes-Currently Present Is patient at risk for suicide?: No Suicidal Plan?: Yes-Currently Present Specify Current Suicidal Plan: To possibly cut herself Access to Means: Yes Specify Access to Suicidal Means: access to a knife What has been your use of drugs/alcohol within the last 12 months?: Pt denies hx of drig use. Pt says she only drinks socially. How many times?: 0 Other Self Harm Risks: NA Triggers for Past Attempts: None known Intentional Self Injurious Behavior: None Risk to Others: Homicidal Ideation: No Thoughts of Harm to Others: No Current Homicidal Intent: No Current Homicidal Plan: No Access to Homicidal Means: No Identified Victim: NA History of harm to others?: No Assessment of Violence: None Noted Violent Behavior Description: NA Does patient have access to weapons?: No Criminal Charges Pending?: No Does patient have a court date: No Prior Inpatient Therapy: Prior Inpatient Therapy: No Prior Therapy Dates: NA Prior Therapy Facilty/Provider(s): NA Reason for Treatment: NA Prior Outpatient Therapy: Prior Outpatient Therapy: No Prior Therapy Dates: NA Prior Therapy Facilty/Provider(s): NA Reason for Treatment: NA Does patient have an ACCT team?: No Does patient have Intensive In-House Services?  : No Does patient have [facility] services? : No Does patient have P4CC services?: No  Level of Care:  IOP  Hospital Course:  Monica Ortega was admitted for MDD (major depressive disorder), recurrent severe, without psychosis ([place]) and crisis management.  She was treated discharged with the medications listed below under Medication List.  Medical problems were identified and treated as needed.  Home medications were restarted as appropriate.  Improvement was monitored by  observation and Gardiner Sleeper daily report of symptom reduction.  Emotional and mental status was monitored by daily self-inventory reports completed by Gardiner Sleeper and clinical staff.         Monica Ortega was evaluated by the treatment team for stability and plans for continued recovery upon discharge.  Monica Ortega motivation was an integral factor for scheduling further treatment.  Employment, transportation, bed availability, health status, family support, and any pending legal issues were also considered during her hospital stay.  She was offered further treatment options upon discharge including but not limited to Residential, Intensive Outpatient, and Outpatient treatment.  Monica Ortega will follow up with the services as listed below under Follow Up Information.     Upon completion of this admission the patient was both mentally and medically stable for discharge denying suicidal/homicidal ideation, auditory/visual/tactile hallucinations, delusional thoughts and paranoia.       Upon discharge she plans to reach out to people more, increase communications through lunch dates and schedule meetings. She plans  to resume daily use of yoga, running, and find a creative outlet to do. She plans to use mindfulness practice techniques, analyze the negative thoughts and let it move past so that she no longer stresses about it.  Consults:  None  Significant Diagnostic Studies:  None  Discharge Vitals:   Blood pressure 99/52, pulse 119, temperature 97.6 F (36.4 C), temperature source Oral, resp. rate 16, height _0  (1.6 m), weight 63.957 kg (141 lb), last menstrual period 03/26/2015, SpO2 100 %. Body mass index is 24.98 kg/(m^2). Lab Results:   Results for orders placed or performed during the hospital encounter of 04/16/15 (from the past 72 hour(s))  Urine rapid drug screen (hosp performed)not at [facility]     Status: None   Collection Time: 04/16/15  6:00 PM  Result Value Ref Range   Opiates NONE  DETECTED NONE DETECTED   Cocaine NONE DETECTED NONE DETECTED   Benzodiazepines NONE DETECTED NONE DETECTED   Amphetamines NONE DETECTED NONE DETECTED   Tetrahydrocannabinol NONE DETECTED NONE DETECTED   Barbiturates NONE DETECTED NONE DETECTED    Comment:        DRUG SCREEN FOR MEDICAL PURPOSES ONLY.  IF CONFIRMATION IS NEEDED FOR ANY PURPOSE, NOTIFY LAB WITHIN 5 DAYS.        LOWEST DETECTABLE LIMITS FOR URINE DRUG SCREEN Drug Class       Cutoff (ng/mL) Amphetamine      1000 Barbiturate      200 Benzodiazepine   092 Tricyclics       330 Opiates          300 Cocaine          300 THC              50 Performed at [facility]   Pregnancy, urine     Status: None   Collection Time: 04/16/15  6:00 PM  Result Value Ref Range   Preg Test, Ur NEGATIVE NEGATIVE    Comment:        THE SENSITIVITY OF THIS METHODOLOGY IS >20 mIU/mL. Performed at [facility]   CBC     Status: None   Collection Time: 04/16/15  6:20 PM  Result Value Ref Range   WBC 6.1 4.0 - 10.5 K/uL   RBC 4.71 3.87 - 5.11 MIL/uL   Hemoglobin 13.7 12.0 - 15.0 g/dL   HCT 41.4 36.0 - 46.0 %   MCV 87.9 78.0 - 100.0 fL   MCH 29.1 26.0 - 34.0 pg   MCHC 33.1 30.0 - 36.0 g/dL   RDW 12.2 11.5 - 15.5 %   Platelets 255 150 - 400 K/uL    Comment: Performed at [facility]  Comprehensive metabolic panel     Status: Abnormal   Collection Time: 04/16/15  6:20 PM  Result Value Ref Range   Sodium 137 135 - 145 mmol/L   Potassium 4.4 3.5 - 5.1 mmol/L   Chloride 104 101 - 111 mmol/L   CO2 26 22 - 32 mmol/L   Glucose, Bld 118 (H) 65 - 99 mg/dL   BUN 8 6 - 20 mg/dL   Creatinine, Ser 0.74 0.44 - 1.00 mg/dL   Calcium 8.8 (L) 8.9 - 10.3 mg/dL   Total Protein 7.1 6.5 - 8.1 g/dL   Albumin 3.7 3.5 - 5.0 g/dL   AST 23 15 - 41 U/L   ALT 18 14 - 54 U/L   Alkaline Phosphatase 45 38 - 126 U/L  Total Bilirubin 0.4 0.3 - 1.2 mg/dL   GFR calc non Af Amer >60 >60 mL/min   GFR calc  Af Amer >60 >60 mL/min    Comment: (NOTE) The eGFR has been calculated using the CKD EPI equation. This calculation has not been validated in all clinical situations. eGFR's persistently <60 mL/min signify possible Chronic Kidney Disease.    Anion gap 7 5 - 15    Comment: Performed at [facility]  Hemoglobin A1c     Status: Abnormal   Collection Time: 04/16/15  6:20 PM  Result Value Ref Range   Hgb A1c MFr Bld 5.7 (H) 4.8 - 5.6 %    Comment: (NOTE)         Pre-diabetes: 5.7 - 6.4         Diabetes: >6.4         Glycemic control for adults with diabetes: <7.0    Mean Plasma Glucose 117 mg/dL    Comment: (NOTE) Performed At: [facility] [place], [place] [id number] Lindon Romp MD [id number] Performed at [facility]   TSH     Status: None   Collection Time: 04/16/15  6:20 PM  Result Value Ref Range   TSH 2.356 0.350 - 4.500 uIU/mL    Comment: Performed at [facility]    Physical Findings: AIMS: Facial and Oral Movements Muscles of Facial Expression: None, normal Lips and Perioral Area: None, normal Jaw: None, normal Tongue: None, normal,Extremity Movements Upper (arms, wrists, hands, fingers): None, normal Lower (legs, knees, ankles, toes): None, normal, Trunk Movements Neck, shoulders, hips: None, normal, Overall Severity Severity of abnormal movements (highest score from questions above): None, normal Incapacitation due to abnormal movements: None, normal Patient's awareness of abnormal movements (rate only patient's report): No Awareness, Dental Status Current problems with teeth and/or dentures?: No Does patient usually wear dentures?: No  CIWA:  CIWA-Ar Total: 0 COWS:  COWS Total Score: 1   See Psychiatric Specialty Exam and Suicide Risk Assessment completed by Attending Physician prior to discharge.  Discharge destination:  Home Her dad Monica Ortega is coming to pick her up.    Is patient on multiple antipsychotic therapies at discharge:  No   Has Patient had three or more failed trials of antipsychotic monotherapy by history:  No    Recommended Plan for Multiple Antipsychotic Therapies: NA     Medication List    ASK your doctor about these medications      Indication   aspirin-acetaminophen-caffeine 250-250-65 MG tablet  Commonly known as:  EXCEDRIN MIGRAINE  Take 2 tablets by mouth every 6 (six) hours as needed for headache.      escitalopram 10 MG tablet  Commonly known as:  LEXAPRO  Take 1 tablet (10 mg total) by mouth daily.      levonorgestrel-ethinyl estradiol 0.1-20 MG-MCG tablet  Commonly known as:  AVIANE,ALESSE,LESSINA  Take 1 tablet by mouth daily.      multivitamin capsule  Take 1 capsule by mouth daily.      omega-3 acid ethyl esters 1 G capsule  Commonly known as:  LOVAZA  Take 1 g by mouth daily.      PROBIOTIC FORMULA Caps  Take 1 capsule by mouth daily as needed (for constipation).            Follow-up Information    Follow up with [facility].   Specialty:  Behavioral Health   Why:  Voicemail was  left on 10/7 requesting appointment. Please call Dr. Glennon Hamilton at the number below to schedule your next appointment.   Contact information:   [place] [phone]      Follow up with [facility].   Specialty:  Behavioral Health   Why:  Social gave insurance information and demographics to staff at [facility]. They will call Monday with your appointment time which will be within the next two weeks.   Contact information:   [place] [address] [phone]       Follow-up recommendations:  Activity:  Increased activity as tolerated. Diet:  Regular house diet.   Comments:  Take all medications as prescribed. Keep all follow-up appointments as scheduled.  Do not consume alcohol or use illegal drugs while  on prescription medications. Report any adverse effects from your medications to your primary care provider promptly.  In the event of recurrent symptoms or worsening symptoms, call 911, a crisis hotline, or go to the nearest emergency department for evaluation.   Total Discharge Time: Greater than 19mnutes.   Signed: SNanci PinaFNP-BC 04/19/2015, 9:44 AM

## 2015-04-19 NOTE — Progress Notes (Signed)
  [facility] Adult Case Management Discharge Plan :  Will you be returning to the same living situation after discharge:  Yes,  with father At discharge, do you have transportation home?: Yes,  father Do you have the ability to pay for your medications: Yes,  no issues  Release of information consent forms completed and in the chart;  Patient's signature needed at discharge.  Patient to Follow up at: Follow-up Information    Follow up with [facility].   Specialty:  Behavioral Health   Why:  Voicemail was left on 10/7 requesting appointment. Please call Dr. Glennon Hamilton at the number below to schedule your next appointment.   Contact information:   [place] [phone]      Follow up with [facility].   Specialty:  Behavioral Health   Why:  Social gave insurance information and demographics to staff at [facility]. They will call Monday with your appointment time which will be within the next two weeks.   Contact information:   [place] [address] [phone]       Patient denies SI/HI: Yes,  see doctor SRA    Safety Planning and Suicide Prevention discussed: Yes,  with pt and father  Have you used any form of tobacco in the last 30 days? (Cigarettes, Smokeless Tobacco, Cigars, and/or Pipes): No  Has patient been referred to the Quitline?: N/A patient is not a smoker  Lysle Dingwall 04/19/2015, 9:36 AM

## 2015-04-19 NOTE — Progress Notes (Signed)
Discharge note: Patient discharge to family. Patient denies SI/HI/AVH. Patient's belongings returned to patient with verbalization that all belongings were present.

## 2015-04-19 NOTE — Progress Notes (Signed)
D: Patient stated she slept good last night with the help of sleep medication. She described her energy level as normal and her concentration as good. She rates her depression between 1-2, her hopelessness at 0, and her anxiety at 0. She denies SI/HI/AVH. BP was low in AM but patient stated she felt better after eating breakfast. BP 102/56. She stated being dizzy and lightheaded but states she is better now. Her goal today is to work on discharge planning and to socialize more. She plans to meet this goal by being in the day room.  A: Medications were given. SI/HI ideations were assessed. Discharge plans discussed.  R: Patient was cooperative with her medications. Patient is pleasant and eager to go home.

## 2015-04-20 ENCOUNTER — Encounter: Payer: Self-pay | Admitting: Family Medicine

## 2015-04-20 ENCOUNTER — Ambulatory Visit (INDEPENDENT_AMBULATORY_CARE_PROVIDER_SITE_OTHER): Payer: BLUE CROSS/BLUE SHIELD | Admitting: Family Medicine

## 2015-04-20 VITALS — BP 102/80 | HR 85 | Temp 98.3°F | Ht 64.0 in | Wt 139.5 lb

## 2015-04-20 DIAGNOSIS — F332 Major depressive disorder, recurrent severe without psychotic features: Secondary | ICD-10-CM

## 2015-04-20 NOTE — Progress Notes (Signed)
HPI:  Hospitalized 10/6-10/03/2015 for thoughts of harm to her self, MDD - severe -per review of discharge notes is to follow up with pyschiatry and Dr. Glennon Hamilton for counseling -meds: wellbutrin 150 bid, trazadone 50  -reports has planned close follow up with psychiatry and with Dr. Glennon Hamilton -reports " doing much better", "really good support from family and friends" -denies: worsening mood, any thoughts of self harm or harm to others since hospitalization  ROS: See pertinent positives and negatives per HPI.  Past Medical History  Diagnosis Date  . Depression     denies hospitalization or medication  . Ankle sprain   . Anxiety     Past Surgical History  Procedure Laterality Date  . Tonsillectomy    . Wisdom tooth extraction      Family History  Problem Relation Age of Onset  . Diabetes Mother   . Hypertension Father     Social History   Social History  . Marital Status: Unknown    Spouse Name: N/A  . Number of Children: N/A  . Years of Education: N/A   Social History Main Topics  . Smoking status: Never Smoker   . Smokeless tobacco: None  . Alcohol Use: No     Comment: denied alcohol  . Drug Use: No     Comment: denied  . Sexual Activity: Yes    Birth Control/ Protection: Pill   Other Topics Concern  . 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  aspirin-acetaminophen-caffeine (EXCEDRIN MIGRAINE) 250-250-65 MG tablet, Take 2 tablets by mouth every 6 (six) hours as needed for headache., Disp: , Rfl:  .  buPROPion (WELLBUTRIN XL) 150 MG 24 hr tablet, Take 1 tablet (150 mg total) by mouth daily., Disp: 30 tablet, Rfl: 0 .  levonorgestrel-ethinyl estradiol (AVIANE,ALESSE,LESSINA) 0.1-20 MG-MCG tablet, Take 1 tablet by mouth  daily., Disp: , Rfl:  .  Multiple Vitamin (MULTIVITAMIN) capsule, Take 1 capsule by mouth daily., Disp: , Rfl:  .  omega-3 acid ethyl esters (LOVAZA) 1 G capsule, Take 1 g by mouth daily., Disp: , Rfl:  .  Probiotic Product (PROBIOTIC FORMULA) CAPS, Take 1 capsule by mouth daily as needed (for constipation)., Disp: , Rfl:  .  traZODone (DESYREL) 50 MG tablet, Take 1 tablet (50 mg total) by mouth at bedtime as needed for sleep., Disp: 30 tablet, Rfl: 0  EXAM:  Filed Vitals:   04/20/15 1058  BP: 102/80  Pulse: 85  Temp: 98.3 F (36.8 C)    Body mass index is 23.93 kg/(m^2).  GENERAL: vitals reviewed and listed above, alert, oriented, appears well hydrated and in no acute distress  HEENT: atraumatic, conjunttiva clear, no obvious abnormalities on inspection of external nose and ears  NECK: no obvious masses on inspection  LUNGS: clear to auscultation bilaterally, no wheezes, rales or rhonchi, good air movement  CV: HRRR, no peripheral edema  MS: moves all extremities without noticeable abnormality  PSYCH: pleasant and cooperative, flat affect  ASSESSMENT AND PLAN:  Discussed the following assessment and plan:  MDD (major depressive disorder), recurrent severe, without psychosis ([place])  -appears to be doing well per her report  since recent hospitalization -will communicate with Dr. Glennon Hamilton to ensure prompt follow up -she also reports has close follow up with [facility] for medication management moving forward -return and emergency precautions discussed -Patient advised to return or notify a doctor immediately if symptoms worsen or persist or new concerns arise.  Patient Instructions  Please follow up with Dr. Glennon Hamilton   Follow up with [facility]: [place]  [address] [phone]  Seek care immediately if worsening or any thoughts of harming yourself or others - call 911.     [name] R.

## 2015-04-20 NOTE — Progress Notes (Signed)
Pre visit review using our clinic review tool, if applicable. No additional management support is needed unless otherwise documented below in the visit note. 

## 2015-04-20 NOTE — Patient Instructions (Signed)
Please follow up with Dr. Glennon Hamilton   Follow up with [facility]: [place] [address] [phone]  Seek care immediately if worsening or any thoughts of harming yourself or others - call 911.

## 2015-04-22 ENCOUNTER — Ambulatory Visit (INDEPENDENT_AMBULATORY_CARE_PROVIDER_SITE_OTHER): Payer: BLUE CROSS/BLUE SHIELD | Admitting: Psychology

## 2015-04-22 DIAGNOSIS — F341 Dysthymic disorder: Secondary | ICD-10-CM | POA: Diagnosis not present

## 2015-04-30 ENCOUNTER — Ambulatory Visit (INDEPENDENT_AMBULATORY_CARE_PROVIDER_SITE_OTHER): Payer: BLUE CROSS/BLUE SHIELD | Admitting: Psychology

## 2015-04-30 ENCOUNTER — Ambulatory Visit: Payer: BLUE CROSS/BLUE SHIELD | Admitting: Psychology

## 2015-04-30 DIAGNOSIS — F341 Dysthymic disorder: Secondary | ICD-10-CM | POA: Diagnosis not present

## 2015-05-15 ENCOUNTER — Ambulatory Visit (INDEPENDENT_AMBULATORY_CARE_PROVIDER_SITE_OTHER): Payer: BLUE CROSS/BLUE SHIELD | Admitting: Psychology

## 2015-05-15 DIAGNOSIS — F341 Dysthymic disorder: Secondary | ICD-10-CM

## 2015-05-25 ENCOUNTER — Ambulatory Visit (INDEPENDENT_AMBULATORY_CARE_PROVIDER_SITE_OTHER): Payer: BLUE CROSS/BLUE SHIELD | Admitting: Psychology

## 2015-05-25 DIAGNOSIS — F341 Dysthymic disorder: Secondary | ICD-10-CM | POA: Diagnosis not present

## 2015-06-12 ENCOUNTER — Ambulatory Visit (INDEPENDENT_AMBULATORY_CARE_PROVIDER_SITE_OTHER): Payer: 59 | Admitting: Psychology

## 2015-06-12 DIAGNOSIS — F341 Dysthymic disorder: Secondary | ICD-10-CM

## 2015-06-26 ENCOUNTER — Ambulatory Visit (INDEPENDENT_AMBULATORY_CARE_PROVIDER_SITE_OTHER): Payer: 59 | Admitting: Psychology

## 2015-06-26 DIAGNOSIS — F341 Dysthymic disorder: Secondary | ICD-10-CM

## 2015-07-17 ENCOUNTER — Ambulatory Visit: Payer: 59 | Admitting: Psychology

## 2015-07-23 ENCOUNTER — Ambulatory Visit (INDEPENDENT_AMBULATORY_CARE_PROVIDER_SITE_OTHER): Payer: 59 | Admitting: Psychology

## 2015-07-23 DIAGNOSIS — F341 Dysthymic disorder: Secondary | ICD-10-CM

## 2015-07-30 ENCOUNTER — Ambulatory Visit (INDEPENDENT_AMBULATORY_CARE_PROVIDER_SITE_OTHER): Payer: 59 | Admitting: Psychology

## 2015-07-30 DIAGNOSIS — F341 Dysthymic disorder: Secondary | ICD-10-CM | POA: Diagnosis not present

## 2015-08-14 ENCOUNTER — Ambulatory Visit (INDEPENDENT_AMBULATORY_CARE_PROVIDER_SITE_OTHER): Payer: 59 | Admitting: Psychology

## 2015-08-14 DIAGNOSIS — F341 Dysthymic disorder: Secondary | ICD-10-CM | POA: Diagnosis not present

## 2015-09-04 ENCOUNTER — Ambulatory Visit (INDEPENDENT_AMBULATORY_CARE_PROVIDER_SITE_OTHER): Payer: 59 | Admitting: Psychology

## 2015-09-04 DIAGNOSIS — F341 Dysthymic disorder: Secondary | ICD-10-CM | POA: Diagnosis not present

## 2015-09-18 ENCOUNTER — Ambulatory Visit (INDEPENDENT_AMBULATORY_CARE_PROVIDER_SITE_OTHER): Payer: 59 | Admitting: Psychology

## 2015-09-18 DIAGNOSIS — F341 Dysthymic disorder: Secondary | ICD-10-CM | POA: Diagnosis not present

## 2015-10-09 ENCOUNTER — Ambulatory Visit (INDEPENDENT_AMBULATORY_CARE_PROVIDER_SITE_OTHER): Payer: 59 | Admitting: Psychology

## 2015-10-09 DIAGNOSIS — F341 Dysthymic disorder: Secondary | ICD-10-CM | POA: Diagnosis not present

## 2015-10-30 ENCOUNTER — Ambulatory Visit (INDEPENDENT_AMBULATORY_CARE_PROVIDER_SITE_OTHER): Payer: 59 | Admitting: Psychology

## 2015-10-30 DIAGNOSIS — F341 Dysthymic disorder: Secondary | ICD-10-CM

## 2015-11-20 ENCOUNTER — Ambulatory Visit (INDEPENDENT_AMBULATORY_CARE_PROVIDER_SITE_OTHER): Payer: 59 | Admitting: Psychology

## 2015-11-20 DIAGNOSIS — F341 Dysthymic disorder: Secondary | ICD-10-CM | POA: Diagnosis not present

## 2015-12-11 ENCOUNTER — Ambulatory Visit (INDEPENDENT_AMBULATORY_CARE_PROVIDER_SITE_OTHER): Payer: 59 | Admitting: Psychology

## 2015-12-11 DIAGNOSIS — F341 Dysthymic disorder: Secondary | ICD-10-CM | POA: Diagnosis not present

## 2016-01-01 ENCOUNTER — Ambulatory Visit: Payer: 59 | Admitting: Psychology

## 2016-01-25 ENCOUNTER — Ambulatory Visit (INDEPENDENT_AMBULATORY_CARE_PROVIDER_SITE_OTHER): Payer: 59 | Admitting: Psychology

## 2016-01-25 DIAGNOSIS — F341 Dysthymic disorder: Secondary | ICD-10-CM | POA: Diagnosis not present

## 2016-02-15 ENCOUNTER — Ambulatory Visit: Payer: 59 | Admitting: Psychology

## 2016-02-22 ENCOUNTER — Ambulatory Visit (INDEPENDENT_AMBULATORY_CARE_PROVIDER_SITE_OTHER): Payer: 59 | Admitting: Psychology

## 2016-02-22 DIAGNOSIS — F341 Dysthymic disorder: Secondary | ICD-10-CM | POA: Diagnosis not present

## 2016-03-07 ENCOUNTER — Ambulatory Visit (INDEPENDENT_AMBULATORY_CARE_PROVIDER_SITE_OTHER): Payer: 59 | Admitting: Psychology

## 2016-03-07 DIAGNOSIS — F341 Dysthymic disorder: Secondary | ICD-10-CM

## 2016-03-28 ENCOUNTER — Ambulatory Visit (INDEPENDENT_AMBULATORY_CARE_PROVIDER_SITE_OTHER): Payer: 59 | Admitting: Psychology

## 2016-03-28 DIAGNOSIS — F341 Dysthymic disorder: Secondary | ICD-10-CM | POA: Diagnosis not present

## 2016-04-21 ENCOUNTER — Ambulatory Visit (INDEPENDENT_AMBULATORY_CARE_PROVIDER_SITE_OTHER): Payer: 59 | Admitting: Psychology

## 2016-04-21 DIAGNOSIS — F341 Dysthymic disorder: Secondary | ICD-10-CM | POA: Diagnosis not present

## 2016-05-04 NOTE — Progress Notes (Signed)
HPI:  Acute visit for:  Chronic fatigue: -for "at least my entire adult life" -unchanged -chronic depression, managed by psychiatry - on a number of medications (wellbutrin 300, fluoxetine, trazadone - and feels relatively controlled, occ hopelessness -gets between 6-7 hours of sleep per night on average -gets regular exercise -denies: change in energy level, HA, allergies, unexplained wt changes, changes in bowels, jt or muscle pains, fevers, cough, night sweats, snoring, unrestful sleep, apenic spells or any other concerns  ROS: See pertinent positives and negatives per HPI.  Past Medical History:  Diagnosis Date  . Ankle sprain   . Anxiety   . Depression    denies hospitalization or medication    Past Surgical History:  Procedure Laterality Date  . TONSILLECTOMY    . WISDOM TOOTH EXTRACTION      Family History  Problem Relation Age of Onset  . Diabetes Mother   . Hypertension Father     Social History   Social History  . Marital status: Unknown    Spouse name: N/A  . Number of children: N/A  . Years of education: N/A   Social History Main Topics  . Smoking status: Never Smoker  . Smokeless tobacco: None  . Alcohol use No     Comment: denied alcohol  . Drug use: No     Comment: denied  . Sexual activity: Yes    Birth control/ protection: Pill   Other Topics Concern  . 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  aspirin-acetaminophen-caffeine (EXCEDRIN MIGRAINE) 250-250-65 MG tablet, Take 2 tablets by mouth every 6 (six) hours as needed for headache., Disp: , Rfl:  .  buPROPion (WELLBUTRIN XL) 300 MG 24 hr tablet, , Disp: , Rfl:  .  FLUoxetine (PROZAC) 20 MG capsule, , Disp: , Rfl:  .   levonorgestrel-ethinyl estradiol (AVIANE,ALESSE,LESSINA) 0.1-20 MG-MCG tablet, Take 1 tablet by mouth daily., Disp: , Rfl:  .  Multiple Vitamin (MULTIVITAMIN) capsule, Take 1 capsule by mouth daily., Disp: , Rfl:  .  omega-3 acid ethyl esters (LOVAZA) 1 G capsule, Take 1 g by mouth daily., Disp: , Rfl:  .  Probiotic Product (PROBIOTIC FORMULA) CAPS, Take 1 capsule by mouth daily as needed (for constipation)., Disp: , Rfl:  .  traZODone (DESYREL) 50 MG tablet, Take 1 tablet (50 mg total) by mouth at bedtime as needed for sleep., Disp: 30 tablet, Rfl: 0  EXAM:  Vitals:   05/05/16 0902  BP: 100/60  Pulse: 91  Temp: 97.6 F (36.4 C)    Body mass index is 23.09 kg/m.  GENERAL: vitals reviewed and listed above, alert, oriented, appears well hydrated and in no acute distress  HEENT: atraumatic, conjunttiva clear, no obvious abnormalities on inspection of external nose and ears  NECK: no obvious masses on inspection  LUNGS: clear to auscultation bilaterally, no wheezes, rales or rhonchi, good air movement  CV: HRRR, no peripheral edema  MS: moves all extremities without noticeable abnormality  PSYCH: pleasant and cooperative, no obvious depression or anxiety  ASSESSMENT AND PLAN:  Discussed the following assessment and plan:  Chronic fatigue - Plan: CMP with eGFR, TSH, CBC  with Differential/Platelets, Vitamin B12, Vitamin D, 25-hydroxy  MDD (major depressive disorder), recurrent severe, without psychosis ([place])  Inadequate sleep hygiene  Hyperglycemia - Plan: Hemoglobin A1c  -we discussed possible serious and likely etiologies, workup and treatment, treatment risks and return precautions -after this discussion, Tija opted for labs per orders, healthy lifestyle, increasing sleep -follow up advised 3-4 months -of course, we advised [place]  to return or notify a doctor immediately if symptoms worsen or persist or new concerns arise.    Patient Instructions  BEFORE YOU  LEAVE: -follow up: 3- 4 months -labs  Try to increase sleep time to between 7-9 hours per day. Consider black out blinds.  We have ordered labs or studies at this visit. It can take up to 1-2 weeks for results and processing. IF results require follow up or explanation, we will call you with instructions. Clinically stable results will be released to your [facility]. If you have not heard from [place] or cannot find your results in [facility] in 2 weeks please contact our office at [phone].  If you are not yet signed up for [facility], please consider signing up.   We recommend the following healthy lifestyle:  Eat a healthy clean diet.  * Tip: Avoid (less then 1 serving per week): processed foods, sweets, sweetened drinks, white starches (rice, flour, bread, potatoes, pasta, etc), red meat, fast foods, butter  *Tip: CHOOSE instead   * 5-9 servings per day of fresh or frozen fruits and vegetables (but not corn, potatoes, bananas, canned or dried fruit)   *nuts and seeds, beans   *olives and olive oil   *small portions of lean meats such as fish and white chicken    *small portions of whole grains  Get at least 150 minutes of sweaty aerobic exercise per week.  Reduce stress - consider counseling, meditation and relaxation to balance other aspects of your life.          [name] R., DO

## 2016-05-05 ENCOUNTER — Encounter: Payer: Self-pay | Admitting: Family Medicine

## 2016-05-05 ENCOUNTER — Ambulatory Visit (INDEPENDENT_AMBULATORY_CARE_PROVIDER_SITE_OTHER): Payer: 59 | Admitting: Family Medicine

## 2016-05-05 VITALS — BP 100/60 | HR 91 | Temp 97.6°F | Ht 64.0 in | Wt 134.5 lb

## 2016-05-05 DIAGNOSIS — R5382 Chronic fatigue, unspecified: Secondary | ICD-10-CM

## 2016-05-05 DIAGNOSIS — Z72821 Inadequate sleep hygiene: Secondary | ICD-10-CM

## 2016-05-05 DIAGNOSIS — F332 Major depressive disorder, recurrent severe without psychotic features: Secondary | ICD-10-CM | POA: Diagnosis not present

## 2016-05-05 DIAGNOSIS — R739 Hyperglycemia, unspecified: Secondary | ICD-10-CM

## 2016-05-05 LAB — COMPREHENSIVE METABOLIC PANEL
ALT: 16 U/L (ref 0–35)
AST: 16 U/L (ref 0–37)
Albumin: 4.1 g/dL (ref 3.5–5.2)
Alkaline Phosphatase: 33 U/L — ABNORMAL LOW (ref 39–117)
BUN: 14 mg/dL (ref 6–23)
CO2: 31 mEq/L (ref 19–32)
Calcium: 9.4 mg/dL (ref 8.4–10.5)
Chloride: 104 mEq/L (ref 96–112)
Creatinine, Ser: 0.9 mg/dL (ref 0.40–1.20)
GFR: 79.35 mL/min (ref >60.00)
Glucose, Bld: 79 mg/dL (ref 70–99)
Potassium: 4.2 mEq/L (ref 3.5–5.1)
Sodium: 142 mEq/L (ref 135–145)
Total Bilirubin: 0.5 mg/dL (ref 0.2–1.2)
Total Protein: 6.5 g/dL (ref 6.0–8.3)

## 2016-05-05 LAB — HEMOGLOBIN A1C: Hgb A1c MFr Bld: 5.2 % (ref 4.6–6.5)

## 2016-05-05 LAB — CBC WITH DIFFERENTIAL/PLATELET
Basophils Absolute: 0 10*3/uL (ref 0.0–0.1)
Basophils Relative: 0.9 % (ref 0.0–3.0)
Eosinophils Absolute: 0.1 10*3/uL (ref 0.0–0.7)
Eosinophils Relative: 1.9 % (ref 0.0–5.0)
HCT: 41.1 % (ref 36.0–46.0)
Hemoglobin: 14.1 g/dL (ref 12.0–15.0)
Lymphocytes Relative: 34 % (ref 12.0–46.0)
Lymphs Abs: 1.4 10*3/uL (ref 0.7–4.0)
MCHC: 34.5 g/dL (ref 30.0–36.0)
MCV: 86.3 fl (ref 78.0–100.0)
Monocytes Absolute: 0.3 10*3/uL (ref 0.1–1.0)
Monocytes Relative: 7.5 % (ref 3.0–12.0)
Neutro Abs: 2.3 10*3/uL (ref 1.4–7.7)
Neutrophils Relative %: 55.7 % (ref 43.0–77.0)
Platelets: 227 10*3/uL (ref 150.0–400.0)
RBC: 4.76 Mil/uL (ref 3.87–5.11)
RDW: 12.4 % (ref 11.5–15.5)
WBC: 4.2 10*3/uL (ref 4.0–10.5)

## 2016-05-05 LAB — TSH: TSH: 2.92 u[IU]/mL (ref 0.35–4.50)

## 2016-05-05 NOTE — Progress Notes (Signed)
Pre visit review using our clinic review tool, if applicable. No additional management support is needed unless otherwise documented below in the visit note. 

## 2016-05-05 NOTE — Addendum Note (Signed)
Addended by: Elmer Picker on: 05/05/2016 09:50 AM   Modules accepted: Orders

## 2016-05-05 NOTE — Patient Instructions (Signed)
BEFORE YOU LEAVE: -follow up: 3- 4 months -labs  Try to increase sleep time to between 7-9 hours per day. Consider black out blinds.  We have ordered labs or studies at this visit. It can take up to 1-2 weeks for results and processing. IF results require follow up or explanation, we will call you with instructions. Clinically stable results will be released to your [facility]. If you have not heard from [place] or cannot find your results in [facility] in 2 weeks please contact our office at [phone].  If you are not yet signed up for [facility], please consider signing up.   We recommend the following healthy lifestyle:  Eat a healthy clean diet.  * Tip: Avoid (less then 1 serving per week): processed foods, sweets, sweetened drinks, white starches (rice, flour, bread, potatoes, pasta, etc), red meat, fast foods, butter  *Tip: CHOOSE instead   * 5-9 servings per day of fresh or frozen fruits and vegetables (but not corn, potatoes, bananas, canned or dried fruit)   *nuts and seeds, beans   *olives and olive oil   *small portions of lean meats such as fish and white chicken    *small portions of whole grains  Get at least 150 minutes of sweaty aerobic exercise per week.  Reduce stress - consider counseling, meditation and relaxation to balance other aspects of your life.

## 2016-05-09 ENCOUNTER — Ambulatory Visit (INDEPENDENT_AMBULATORY_CARE_PROVIDER_SITE_OTHER): Payer: 59 | Admitting: Psychology

## 2016-05-09 DIAGNOSIS — F341 Dysthymic disorder: Secondary | ICD-10-CM | POA: Diagnosis not present

## 2016-05-09 LAB — VITAMIN D 25 HYDROXY (VIT D DEFICIENCY, FRACTURES): VITD: 36.29 ng/mL (ref 30.00–100.00)

## 2016-05-09 LAB — VITAMIN B12: Vitamin B-12: 395 pg/mL (ref 211–911)

## 2016-05-30 ENCOUNTER — Ambulatory Visit (INDEPENDENT_AMBULATORY_CARE_PROVIDER_SITE_OTHER): Payer: 59 | Admitting: Psychology

## 2016-05-30 DIAGNOSIS — F341 Dysthymic disorder: Secondary | ICD-10-CM

## 2016-05-30 DIAGNOSIS — F4010 Social phobia, unspecified: Secondary | ICD-10-CM | POA: Diagnosis not present

## 2016-09-06 ENCOUNTER — Ambulatory Visit (INDEPENDENT_AMBULATORY_CARE_PROVIDER_SITE_OTHER): Payer: 59 | Admitting: Psychology

## 2016-09-06 DIAGNOSIS — F4010 Social phobia, unspecified: Secondary | ICD-10-CM

## 2016-09-06 DIAGNOSIS — F341 Dysthymic disorder: Secondary | ICD-10-CM

## 2016-10-17 ENCOUNTER — Ambulatory Visit (INDEPENDENT_AMBULATORY_CARE_PROVIDER_SITE_OTHER): Payer: 59 | Admitting: Psychology

## 2016-10-17 DIAGNOSIS — F341 Dysthymic disorder: Secondary | ICD-10-CM

## 2016-10-17 DIAGNOSIS — F4010 Social phobia, unspecified: Secondary | ICD-10-CM | POA: Diagnosis not present

## 2016-10-22 DIAGNOSIS — R1110 Vomiting, unspecified: Secondary | ICD-10-CM | POA: Diagnosis not present

## 2016-10-22 DIAGNOSIS — R112 Nausea with vomiting, unspecified: Secondary | ICD-10-CM | POA: Diagnosis not present

## 2016-10-22 DIAGNOSIS — R197 Diarrhea, unspecified: Secondary | ICD-10-CM | POA: Diagnosis not present

## 2016-12-12 ENCOUNTER — Ambulatory Visit (INDEPENDENT_AMBULATORY_CARE_PROVIDER_SITE_OTHER): Payer: 59 | Admitting: Psychology

## 2016-12-12 DIAGNOSIS — F341 Dysthymic disorder: Secondary | ICD-10-CM

## 2016-12-12 DIAGNOSIS — F4010 Social phobia, unspecified: Secondary | ICD-10-CM

## 2016-12-13 DIAGNOSIS — Z01419 Encounter for gynecological examination (general) (routine) without abnormal findings: Secondary | ICD-10-CM | POA: Diagnosis not present

## 2016-12-13 DIAGNOSIS — Z6824 Body mass index (BMI) 24.0-24.9, adult: Secondary | ICD-10-CM | POA: Diagnosis not present

## 2017-01-02 DIAGNOSIS — H1032 Unspecified acute conjunctivitis, left eye: Secondary | ICD-10-CM | POA: Diagnosis not present

## 2017-02-02 ENCOUNTER — Ambulatory Visit (INDEPENDENT_AMBULATORY_CARE_PROVIDER_SITE_OTHER): Payer: 59 | Admitting: Family Medicine

## 2017-02-02 ENCOUNTER — Encounter: Payer: Self-pay | Admitting: Family Medicine

## 2017-02-02 VITALS — BP 98/72 | HR 64 | Temp 98.3°F | Ht 64.0 in | Wt 144.5 lb

## 2017-02-02 DIAGNOSIS — K625 Hemorrhage of anus and rectum: Secondary | ICD-10-CM

## 2017-02-02 DIAGNOSIS — R198 Other specified symptoms and signs involving the digestive system and abdomen: Secondary | ICD-10-CM

## 2017-02-02 LAB — CBC WITH DIFFERENTIAL/PLATELET
Basophils Absolute: 0 10*3/uL (ref 0.0–0.1)
Basophils Relative: 0.8 % (ref 0.0–3.0)
Eosinophils Absolute: 0.1 10*3/uL (ref 0.0–0.7)
Eosinophils Relative: 2 % (ref 0.0–5.0)
HCT: 42.2 % (ref 36.0–46.0)
Hemoglobin: 14.1 g/dL (ref 12.0–15.0)
Lymphocytes Relative: 24.5 % (ref 12.0–46.0)
Lymphs Abs: 1.3 10*3/uL (ref 0.7–4.0)
MCHC: 33.4 g/dL (ref 30.0–36.0)
MCV: 88.4 fl (ref 78.0–100.0)
Monocytes Absolute: 0.3 10*3/uL (ref 0.1–1.0)
Monocytes Relative: 6.1 % (ref 3.0–12.0)
Neutro Abs: 3.6 10*3/uL (ref 1.4–7.7)
Neutrophils Relative %: 66.6 % (ref 43.0–77.0)
Platelets: 256 10*3/uL (ref 150.0–400.0)
RBC: 4.77 Mil/uL (ref 3.87–5.11)
RDW: 12.6 % (ref 11.5–15.5)
WBC: 5.4 10*3/uL (ref 4.0–10.5)

## 2017-02-02 LAB — BASIC METABOLIC PANEL WITH GFR
BUN: 12 mg/dL (ref 6–23)
CO2: 29 meq/L (ref 19–32)
Calcium: 9.4 mg/dL (ref 8.4–10.5)
Chloride: 103 meq/L (ref 96–112)
Creatinine, Ser: 0.86 mg/dL (ref 0.40–1.20)
GFR: 83.17 mL/min (ref >60.00)
Glucose, Bld: 95 mg/dL (ref 70–99)
Potassium: 4.5 meq/L (ref 3.5–5.1)
Sodium: 138 meq/L (ref 135–145)

## 2017-02-02 LAB — TSH: TSH: 2.07 u[IU]/mL (ref 0.35–4.50)

## 2017-02-02 NOTE — Progress Notes (Signed)
HPI:  Change in bowel habits: -started after "food poisoning" in May 2018 -reports was at the beach and was treated with IVFs and antiemetic and symptoms resolved -however, since she has had more slimy texture to stools, sometimes loose stools and BRBPR about 1x per month (sm amount on TP and stool) -denies: fevers, vomiting, abd pain, melena, foul odor to stool, wt loss, malaise, rashes, FH GI illness  ROS: See pertinent positives and negatives per HPI.  Past Medical History:  Diagnosis Date  . Ankle sprain   . Anxiety   . Depression    denies hospitalization or medication    Past Surgical History:  Procedure Laterality Date  . TONSILLECTOMY    . WISDOM TOOTH EXTRACTION      Family History  Problem Relation Age of Onset  . Diabetes Mother   . Hypertension Father     Social History   Social History  . Marital status: Unknown    Spouse name: N/A  . Number of children: N/A  . Years of education: N/A   Social History Main Topics  . Smoking status: Never Smoker  . Smokeless tobacco: Never Used  . Alcohol use No     Comment: denied alcohol  . Drug use: No     Comment: denied  . Sexual activity: Yes    Birth control/ protection: Pill   Other Topics Concern  . 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  aspirin-acetaminophen-caffeine (EXCEDRIN MIGRAINE) 250-250-65 MG tablet, Take 2 tablets by mouth every 6 (six) hours as needed for headache., Disp: , Rfl:  .  FLUoxetine (PROZAC) 20 MG capsule, , Disp: , Rfl:  .  levonorgestrel-ethinyl estradiol (AVIANE,ALESSE,LESSINA) 0.1-20 MG-MCG tablet, Take 1 tablet by mouth daily., Disp: , Rfl:  .  Multiple Vitamin (MULTIVITAMIN) capsule, Take 1 capsule by mouth daily., Disp: ,  Rfl:  .  omega-3 acid ethyl esters (LOVAZA) 1 G capsule, Take 1 g by mouth daily., Disp: , Rfl:  .  Probiotic Product (PROBIOTIC FORMULA) CAPS, Take 1 capsule by mouth daily as needed (for constipation)., Disp: , Rfl:  .  traZODone (DESYREL) 50 MG tablet, Take 1 tablet (50 mg total) by mouth at bedtime as needed for sleep., Disp: 30 tablet, Rfl: 0  EXAM:  Vitals:   02/02/17 1034  BP: 98/72  Pulse: 64  Temp: 98.3 F (36.8 C)    Body mass index is 24.8 kg/m.  GENERAL: vitals reviewed and listed above, alert, oriented, appears well hydrated and in no acute distress  HEENT: atraumatic, conjunttiva clear, no obvious abnormalities on inspection of external nose and ears  NECK: no obvious masses on inspection  LUNGS: clear to auscultation bilaterally, no wheezes, rales or rhonchi, good air movement  CV: HRRR, no peripheral edema  ABD: BS+, soft, NTTP  MS: moves all extremities without noticeable abnormality  PSYCH: pleasant and cooperative, no obvious depression or anxiety  ASSESSMENT AND PLAN:  Discussed the following assessment and plan:  Abnormal bowel habits - Plan: Basic metabolic panel, CBC with Differential/Platelet, TSH, Celiac Disease Comprehensive Panel with Reflexes, Stool culture, OVA + PARASITE EXAM  BRBPR (bright red blood  per rectum)  -we discussed possible serious and likely etiologies, workup and treatment, treatment risks and return precautions -after this discussion, Cheyenna opted for labs and stool studies per orders, trial dairy free diet for a few weeks -follow up advised about1 month -of course, we advised [place]  to return or notify a doctor immediately if symptoms worsen or persist or new concerns arise.    Patient Instructions  BEFORE YOU LEAVE: -labs -follow up: 1-2 months  We have ordered labs or studies at this visit. It can take up to 1-2 weeks for results and processing. IF results require follow up or explanation, we will call you with  instructions. Clinically stable results will be released to your [facility]. If you have not heard from [place] or cannot find your results in [facility] in 2 weeks please contact our office at [phone].  If you are not yet signed up for [facility], please consider signing up.  Trial no dairy for a few weeks.  I hope you are feeling better soon! Seek care sooner if worsening or new concerns.           [name] R., DO

## 2017-02-02 NOTE — Patient Instructions (Signed)
BEFORE YOU LEAVE: -labs -follow up: 1-2 months  We have ordered labs or studies at this visit. It can take up to 1-2 weeks for results and processing. IF results require follow up or explanation, we will call you with instructions. Clinically stable results will be released to your [facility]. If you have not heard from [place] or cannot find your results in [facility] in 2 weeks please contact our office at [phone].  If you are not yet signed up for [facility], please consider signing up.  Trial no dairy for a few weeks.  I hope you are feeling better soon! Seek care sooner if worsening or new concerns.

## 2017-02-02 NOTE — Addendum Note (Signed)
Addended by: Denna Haggard K on: 02/02/2017 11:06 AM   Modules accepted: Orders

## 2017-02-03 LAB — CELIAC DISEASE COMPREHENSIVE PANEL WITH REFLEXES
IgA: 136 mg/dL (ref 81–463)
Tissue Transglutaminase Ab, IgA: 1 U/mL (ref <4)

## 2017-02-06 LAB — OVA AND PARASITE EXAMINATION: OP: NONE SEEN

## 2017-02-07 LAB — STOOL CULTURE

## 2017-02-13 ENCOUNTER — Ambulatory Visit (INDEPENDENT_AMBULATORY_CARE_PROVIDER_SITE_OTHER): Payer: 59 | Admitting: Psychology

## 2017-02-13 DIAGNOSIS — F341 Dysthymic disorder: Secondary | ICD-10-CM | POA: Diagnosis not present

## 2017-02-13 DIAGNOSIS — F4010 Social phobia, unspecified: Secondary | ICD-10-CM | POA: Diagnosis not present

## 2017-03-07 ENCOUNTER — Ambulatory Visit: Payer: Self-pay | Admitting: Family Medicine

## 2017-03-30 ENCOUNTER — Encounter: Payer: Self-pay | Admitting: Family Medicine

## 2017-04-10 ENCOUNTER — Ambulatory Visit: Payer: 59 | Admitting: Psychology

## 2017-05-25 ENCOUNTER — Ambulatory Visit: Payer: 59 | Admitting: Psychology

## 2017-05-25 DIAGNOSIS — F4010 Social phobia, unspecified: Secondary | ICD-10-CM | POA: Diagnosis not present

## 2017-05-25 DIAGNOSIS — F341 Dysthymic disorder: Secondary | ICD-10-CM | POA: Diagnosis not present

## 2017-05-31 DIAGNOSIS — Z23 Encounter for immunization: Secondary | ICD-10-CM | POA: Diagnosis not present

## 2017-06-29 ENCOUNTER — Ambulatory Visit: Payer: 59 | Admitting: Psychology

## 2017-06-29 DIAGNOSIS — F341 Dysthymic disorder: Secondary | ICD-10-CM

## 2017-06-29 DIAGNOSIS — F4010 Social phobia, unspecified: Secondary | ICD-10-CM | POA: Diagnosis not present

## 2017-07-20 ENCOUNTER — Encounter: Payer: Self-pay | Admitting: Family Medicine

## 2017-08-08 ENCOUNTER — Ambulatory Visit (INDEPENDENT_AMBULATORY_CARE_PROVIDER_SITE_OTHER): Payer: 59 | Admitting: Psychology

## 2017-08-08 DIAGNOSIS — F4010 Social phobia, unspecified: Secondary | ICD-10-CM

## 2017-08-08 DIAGNOSIS — F341 Dysthymic disorder: Secondary | ICD-10-CM | POA: Diagnosis not present

## 2017-08-29 ENCOUNTER — Ambulatory Visit: Payer: 59 | Admitting: Family Medicine

## 2017-08-29 ENCOUNTER — Encounter: Payer: Self-pay | Admitting: Family Medicine

## 2017-08-29 VITALS — BP 98/78 | HR 63 | Temp 97.7°F | Ht 64.0 in | Wt 150.5 lb

## 2017-08-29 DIAGNOSIS — L723 Sebaceous cyst: Secondary | ICD-10-CM

## 2017-08-29 MED ORDER — DOXYCYCLINE HYCLATE 100 MG PO TABS: 100.0000 mg | ORAL_TABLET | Freq: Two times a day (BID) | ORAL | 0 refills | Status: DC

## 2017-08-29 NOTE — Patient Instructions (Signed)
Warm compresses several times daily.  Topical hydrocortisone cream.   Doxy (antibiotic) if worsening or not improving.  I hope you are feeling better soon! Seek care promptly if your symptoms worsen, new concerns arise or you are not improving with treatment.

## 2017-08-29 NOTE — Progress Notes (Signed)
HPI:  Acute visit for cyst on back: -started a few days ago -has had inflamed cyst same place before - L low back -mildly sore -no drainage, fevers, malaise -Has not tried any treatments  Not seen in some time.  Still seeing psychiatry for management of anxiety and depression.  Reports mood is good.  See PHQ 9.  ROS: See pertinent positives and negatives per HPI.  Past Medical History:  Diagnosis Date  . Ankle sprain   . Anxiety   . Depression    denies hospitalization or medication    Past Surgical History:  Procedure Laterality Date  . TONSILLECTOMY    . WISDOM TOOTH EXTRACTION      Family History  Problem Relation Age of Onset  . Diabetes Mother   . Hypertension Father     Social History   Socioeconomic History  . Marital status: Significant Other    Spouse name: None  . Number of children: None  . Years of education: None  . Highest education level: None  Social Needs  . Financial resource strain: None  . Food insecurity - worry: None  . Food insecurity - inability: None  . Transportation needs - medical: None  . Transportation needs - non-medical: None  Occupational History  . None  Tobacco Use  . Smoking status: Never Smoker  . Smokeless tobacco: Never Used  Substance and Sexual Activity  . Alcohol use: No    Comment: denied alcohol  . Drug use: No    Comment: denied  . Sexual activity: Yes    Birth control/protection: Pill  Other Topics Concern  . 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  aspirin-acetaminophen-caffeine (EXCEDRIN MIGRAINE) 250-250-65 MG tablet, Take 2 tablets by mouth every 6 (six) hours as needed for headache., Disp: , Rfl:  .  FLUoxetine (PROZAC) 20 MG capsule, , Disp: , Rfl:   .  levonorgestrel-ethinyl estradiol (AVIANE,ALESSE,LESSINA) 0.1-20 MG-MCG tablet, Take 1 tablet by mouth daily., Disp: , Rfl:  .  omega-3 acid ethyl esters (LOVAZA) 1 G capsule, Take 1 g by mouth daily., Disp: , Rfl:  .  Probiotic Product (PROBIOTIC FORMULA) CAPS, Take 1 capsule by mouth daily as needed (for constipation)., Disp: , Rfl:  .  traZODone (DESYREL) 50 MG tablet, Take 1 tablet (50 mg total) by mouth at bedtime as needed for sleep., Disp: 30 tablet, Rfl: 0 .  doxycycline (VIBRA-TABS) 100 MG tablet, Take 1 tablet (100 mg total) by mouth 2 (two) times daily., Disp: 10 tablet, Rfl: 0  EXAM:  Vitals:   08/29/17 0805  BP: 98/78  Pulse: 63  Temp: 97.7 F (36.5 C)    Body mass index is 25.83 kg/m.  GENERAL: vitals reviewed and listed above, alert, oriented, appears well hydrated and in no acute distress  HEENT: atraumatic, conjunttiva clear, no obvious abnormalities on inspection of external nose and ears  NECK: no obvious masses on inspection  LUNGS: clear to auscultation bilaterally, no wheezes, rales or rhonchi, good air movement  CV: HRRR, no peripheral edema  MS: moves all extremities without noticeable abnormality  SKIN: Small inflamed cyst left lower back, can see port  in the center, mild erythema in the area approximately the size of a nickel  PSYCH: pleasant and cooperative, no obvious depression or anxiety  ASSESSMENT AND PLAN:  Discussed the following assessment and plan:  Inflamed sebaceous cyst  -Discussed treatment options -She decided to try compresses, topical hydrocortisone cream and doxycycline if needed -discussed risks with antibiotic (first day last menstrual period 1 week ago, denies any chance of pregnancy) -Discussed procedural treatment, removal once no longer inflamed if she desires -Patient advised to return or notify a doctor immediately if symptoms worsen or persist or new concerns arise.  Patient Instructions  Warm compresses several  times daily.  Topical hydrocortisone cream.   Doxy (antibiotic) if worsening or not improving.  I hope you are feeling better soon! Seek care promptly if your symptoms worsen, new concerns arise or you are not improving with treatment.     [name], DO

## 2017-09-06 NOTE — Progress Notes (Signed)
HPI:  Acute visit for cyst on back: -she tried compresses and short course doxy, has been squeezing it -doesn't seem much better and has wedding in 2 weeks, wants to drain it -concern for cosmetics -no fevers, malaise -reports mild amount of drainage when squeezes it  ROS: See pertinent positives and negatives per HPI.  Past Medical History:  Diagnosis Date  . Ankle sprain   . Anxiety   . Depression    denies hospitalization or medication    Past Surgical History:  Procedure Laterality Date  . TONSILLECTOMY    . WISDOM TOOTH EXTRACTION      Family History  Problem Relation Age of Onset  . Diabetes Mother   . Hypertension Father     Social History   Socioeconomic History  . Marital status: Significant Other    Spouse name: None  . Number of children: None  . Years of education: None  . Highest education level: None  Social Needs  . Financial resource strain: None  . Food insecurity - worry: None  . Food insecurity - inability: None  . Transportation needs - medical: None  . Transportation needs - non-medical: None  Occupational History  . None  Tobacco Use  . Smoking status: Never Smoker  . Smokeless tobacco: Never Used  Substance and Sexual Activity  . Alcohol use: No    Comment: denied alcohol  . Drug use: No    Comment: denied  . Sexual activity: Yes    Birth control/protection: Pill  Other Topics Concern  . 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  aspirin-acetaminophen-caffeine (EXCEDRIN MIGRAINE) 250-250-65 MG tablet, Take 2 tablets by mouth every 6 (six) hours as needed for headache., Disp: , Rfl:  .  FLUoxetine (PROZAC) 20 MG capsule, , Disp: , Rfl:  .  levonorgestrel-ethinyl estradiol  (AVIANE,ALESSE,LESSINA) 0.1-20 MG-MCG tablet, Take 1 tablet by mouth daily., Disp: , Rfl:  .  omega-3 acid ethyl esters (LOVAZA) 1 G capsule, Take 1 g by mouth daily., Disp: , Rfl:  .  Probiotic Product (PROBIOTIC FORMULA) CAPS, Take 1 capsule by mouth daily as needed (for constipation)., Disp: , Rfl:  .  traZODone (DESYREL) 50 MG tablet, Take 1 tablet (50 mg total) by mouth at bedtime as needed for sleep., Disp: 30 tablet, Rfl: 0 .  doxycycline (VIBRA-TABS) 100 MG tablet, Take 1 tablet (100 mg total) by mouth 2 (two) times daily., Disp: 10 tablet, Rfl: 0  EXAM:  Vitals:   09/07/17 1014  BP: 100/62  Pulse: 71  Temp: 98.1 F (36.7 C)    Body mass index is 25.18 kg/m.  GENERAL: vitals reviewed and listed above, alert, oriented, appears well hydrated and in no acute distress  HEENT: atraumatic, conjunttiva clear, no obvious abnormalities on inspection of external nose and ears  NECK: no obvious masses on inspection  LUNGS: clear to auscultation bilaterally, no wheezes, rales or rhonchi, good air movement  CV: HRRR, no peripheral edema  MS: moves all extremities without noticeable abnormality, small area of fluctuance on the right low back with small surrounding area about the size of a half dollar of erythema and edema  PSYCH: pleasant and cooperative, no obvious depression or anxiety  ASSESSMENT AND PLAN:  Discussed the following assessment and plan:  Abscess  -inflamed cyst vs abscess -discussed various treatment options, she would like to try minimal I and D for now and abx to perhaps further speed up resolution/decrease inflamation -At culture sent  Procedure - Abscess I and D: Informed written consent performed, signed and scanned into chart. Area cleaned with betadine. 2% lidocaine with epi injected for local anesthetic. Incised with 11 blade. Purulent material expressed and culture sent. Cavity explored with sterile hemostats with removal of loculations. Flushed with  saline and sterile wick placed. Care and return instructions provided.  -Patient advised to return or notify a doctor immediately if symptoms worsen or persist or new concerns arise.  Patient Instructions  Keep area clean and dry for at least 24-48 hours.  Remove a small amount of the wick each day until it comes out.  Take the antibiotic as instructed.  Follow-up promptly if any worsening, fevers or concerns or it is not healing as expected.   [name], DO

## 2017-09-07 ENCOUNTER — Ambulatory Visit: Payer: 59 | Admitting: Family Medicine

## 2017-09-07 ENCOUNTER — Encounter: Payer: Self-pay | Admitting: *Deleted

## 2017-09-07 ENCOUNTER — Encounter: Payer: Self-pay | Admitting: Family Medicine

## 2017-09-07 VITALS — BP 100/62 | HR 71 | Temp 98.1°F | Ht 64.0 in | Wt 146.7 lb

## 2017-09-07 DIAGNOSIS — L0291 Cutaneous abscess, unspecified: Secondary | ICD-10-CM | POA: Diagnosis not present

## 2017-09-07 MED ORDER — DOXYCYCLINE HYCLATE 100 MG PO TABS: 100.0000 mg | ORAL_TABLET | Freq: Two times a day (BID) | ORAL | 0 refills | Status: DC

## 2017-09-07 NOTE — Patient Instructions (Signed)
Keep area clean and dry for at least 24-48 hours.  Remove a small amount of the wick each day until it comes out.  Take the antibiotic as instructed.  Follow-up promptly if any worsening, fevers or concerns or it is not healing as expected.

## 2017-09-07 NOTE — Addendum Note (Signed)
Addended by: Agnes Lawrence on: 09/07/2017 11:24 AM   Modules accepted: Orders

## 2017-09-10 LAB — WOUND CULTURE
MICRO NUMBER:: 90262530
RESULT:: NO GROWTH
SPECIMEN QUALITY:: ADEQUATE

## 2017-09-11 ENCOUNTER — Ambulatory Visit: Payer: 59 | Admitting: Psychology

## 2017-09-11 DIAGNOSIS — F341 Dysthymic disorder: Secondary | ICD-10-CM | POA: Diagnosis not present

## 2017-09-11 DIAGNOSIS — F4010 Social phobia, unspecified: Secondary | ICD-10-CM | POA: Diagnosis not present

## 2017-10-12 ENCOUNTER — Ambulatory Visit (INDEPENDENT_AMBULATORY_CARE_PROVIDER_SITE_OTHER): Payer: 59 | Admitting: Psychology

## 2017-10-12 DIAGNOSIS — F4010 Social phobia, unspecified: Secondary | ICD-10-CM | POA: Diagnosis not present

## 2017-10-12 DIAGNOSIS — F341 Dysthymic disorder: Secondary | ICD-10-CM | POA: Diagnosis not present

## 2017-11-09 ENCOUNTER — Ambulatory Visit: Payer: 59 | Admitting: Psychology

## 2017-11-09 DIAGNOSIS — F4010 Social phobia, unspecified: Secondary | ICD-10-CM

## 2017-11-09 DIAGNOSIS — F341 Dysthymic disorder: Secondary | ICD-10-CM

## 2017-12-14 ENCOUNTER — Ambulatory Visit (INDEPENDENT_AMBULATORY_CARE_PROVIDER_SITE_OTHER): Payer: 59 | Admitting: Psychology

## 2017-12-14 DIAGNOSIS — F4010 Social phobia, unspecified: Secondary | ICD-10-CM | POA: Diagnosis not present

## 2017-12-14 DIAGNOSIS — F341 Dysthymic disorder: Secondary | ICD-10-CM

## 2018-02-18 NOTE — Progress Notes (Signed)
HPI:  Using dictation device. Unfortunately this device frequently misinterprets words/phrases.  Here for CPE:  -Concerns and/or follow up today:  She has a history of chronic fatigue and depression. Sees psychiatry for depression and reports currently stable, no SI. Seeing counselor and off medications. 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 Applying to [facility] for surgical tech and has physical form for completion and also requests titers for hep B, MMR and Varicella and also a quant gold. Reports all are required. She does not have her vaccine record. No hx chronic cough, fevers, TB risks factors. She chronically feels tired. Wants to do sleep apnea testing as wakes up feeling un rested. No snoring or apneic spells that she is aware of. Had extensive lab evaluation for fatigue in the past and symptoms unchanged. Reports diet is poor and no regular exercise.  -Exercise: no regular exercise -Taking folic acid, vitamin D or calcium: no -Diabetes and Dyslipidemia Screening: fastiong -Vaccines: see vaccine section EPIC -pap history: reports does with gyn, Dr. Valentino Saxon and will schedule -FDLMP: see nursing notes -wants STI testing (Hep C if born 20-65): no to all except HIV screen with labs -FH breast, colon or ovarian ca: see FH Last mammogram: n/a Last colon cancer screening: n/a Breast Ca Risk Assessment: see family history and pt history DEXA (>/= 65): n/a  -Alcohol, Tobacco, drug use: see social history  Review of Systems - no fevers, unintentional weight loss, vision loss, hearing loss, chest pain, sob, hemoptysis, melena, hematochezia, hematuria, genital discharge, changing or concerning skin lesions, bleeding, bruising, loc, thoughts of self harm or SI  Past Medical History:  Diagnosis Date  . Ankle sprain   . Anxiety   . Depression    denies hospitalization or medication    Past Surgical History:  Procedure Laterality Date  . TONSILLECTOMY    .  WISDOM TOOTH EXTRACTION      Family History  Problem Relation Age of Onset  . Diabetes Mother   . Hypertension Father     Social History   Socioeconomic History  . Marital status: Significant Other    Spouse name: Not on file  . Number of children: Not on file  . Years of education: Not on file  . Highest education level: Not on file  Occupational History  . Not on file  Social Needs  . Financial resource strain: Not on file  . Food insecurity:    Worry: Not on file    Inability: Not on file  . Transportation needs:    Medical: Not on file    Non-medical: Not on file  Tobacco Use  . Smoking status: Never Smoker  . Smokeless tobacco: Never Used  Substance and Sexual Activity  . Alcohol use: No    Comment: denied alcohol  . Drug use: No    Comment: denied  . Sexual activity: Yes    Birth control/protection: Pill  Lifestyle  . Physical activity:    Days per week: Not on file    Minutes per session: Not on file  . Stress: Not on file  Relationships  . Social connections:    Talks on phone: Not on file    Gets together: Not on file    Attends religious service: Not on file    Active member of club or organization: Not on file    Attends meetings of clubs or organizations: Not on file    Relationship status: Not on file  Other Topics Concern  .  Not on file  Social History Narrative   Work or Copywriter, advertising      Home Situation: lives with father and sister      Spiritual Beliefs: none, atheist      Lifestyle: 2-4 times per week jogging or elliptical or bike; diet is so so              Current Outpatient Medications:  .  aspirin-acetaminophen-caffeine (EXCEDRIN MIGRAINE) 250-250-65 MG tablet, Take 2 tablets by mouth every 6 (six) hours as needed for headache., Disp: , Rfl:  .  levonorgestrel-ethinyl estradiol (AVIANE,ALESSE,LESSINA) 0.1-20 MG-MCG tablet, Take 1 tablet by mouth daily., Disp: , Rfl:  .  omega-3 acid ethyl esters (LOVAZA) 1 G  capsule, Take 1 g by mouth daily., Disp: , Rfl:  .  Probiotic Product (PROBIOTIC FORMULA) CAPS, Take 1 capsule by mouth daily as needed (for constipation)., Disp: , Rfl:  .  traZODone (DESYREL) 50 MG tablet, Take 1 tablet (50 mg total) by mouth at bedtime as needed for sleep., Disp: 30 tablet, Rfl: 0  EXAM:  Vitals:   02/19/18 1023  BP: 98/78  Pulse: 74  Temp: 97.8 F (36.6 C)    GENERAL: vitals reviewed and listed below, alert, oriented, appears well hydrated and in no acute distress  HEENT: head atraumatic, PERRLA, normal appearance of eyes, ears, nose and mouth. moist mucus membranes.  NECK: supple, no masses or lymphadenopathy  LUNGS: clear to auscultation bilaterally, no rales, rhonchi or wheeze  CV: HRRR, no peripheral edema or cyanosis, normal pedal pulses  ABDOMEN: bowel sounds normal, soft, non tender to palpation, no masses, no rebound or guarding  GU/BREAST: sees gyn, deferred  SKIN: no rash or abnormal lesions  MS: normal gait, moves all extremities normally  NEURO: normal gait, speech and thought processing grossly intact, muscle tone grossly intact throughout  PSYCH: normal affect, pleasant and cooperative  ASSESSMENT AND PLAN:  Discussed the following assessment and plan:  PREVENTIVE EXAM: -Discussed and advised all [place] preventive services health task force level A and B recommendations for age, sex and risks. -Advised at least 150 minutes of exercise per week and a healthy diet with avoidance of (less then 1 serving per week) processed foods, white starches, red meat, fast foods and sweets and consisting of: * 5-9 servings of fresh fruits and vegetables (not corn or potatoes) *nuts and seeds, beans *olives and olive oil *lean meats such as fish and white chicken  *whole grains -labs, studies and vaccines per orders this encounter - HIV antibody - Hemoglobin A1c - Lipid panel - QuantiFERON-TB Gold Plus screen for school requirements, no symptoms or  risks, health dept eval if positive  2. Vaccine counseling -needs vaccine status for school -minimal vaccine record in registry - did have hep b vaccine x3 and tetanus booster UTD - printed for pt - Varicella Zoster Antibody, IgG - Hepatitis B surface antibody,quantitative  3. Measles, mumps, rubella (MMR) vaccination status unknown - Measles/Mumps/Rubella Immunity -she needs immunity status for school  4. Daytime somnolence, fatigue -prior lab eval neg -seeing psychiatry for dep - most likely contributing -poor diet and no exercise - advised healthy diet and regular exercise -wants sleep apnea eval - referral placed  Patient advised to return to clinic immediately if symptoms worsen or persist or new concerns.  Patient Instructions  BEFORE YOU LEAVE: -labs -follow up: yearly and as needed  See your psychiatrist about depression  See your gynecologist for annual exam  Flu shot in October  We have ordered labs or studies at this visit. It can take up to 1-2 weeks for results and processing. IF results require follow up or explanation, we will call you with instructions. Clinically stable results will be released to your [facility]. If you have not heard from [place] or cannot find your results in [facility] in 2 weeks please contact our office at [phone].  If you are not yet signed up for [facility], please consider signing up.  -We placed a referral for you as discussed for sleep apnea evaluation. It usually takes about 1-2 weeks to process and schedule this referral. If you have not heard from [place] regarding this appointment in 2 weeks please contact our office.    We recommend the following healthy lifestyle for LIFE: 1) Small portions. But, make sure to get regular (at least 3 per day), healthy meals and small healthy snacks if needed.  2) Eat a healthy clean diet.   TRY TO EAT: -at least 5-7 servings of low sugar, colorful, and nutrient rich vegetables per day (not corn, potatoes  or bananas.) -berries are the best choice if you wish to eat fruit (only eat small amounts if trying to reduce weight)  -lean meets (fish, white meat of chicken or [place]) -vegan proteins for some meals - beans or tofu, whole grains, nuts and seeds -Replace bad fats with good fats - good fats include: fish, nuts and seeds, canola oil, olive oil -small amounts of low fat or non fat dairy -small amounts of100 % whole grains - check the lables -drink plenty of water  AVOID: -SUGAR, sweets, anything with added sugar, corn syrup or sweeteners - must read labels as even foods advertised as "healthy" often are loaded with sugar -if you must have a sweetener, small amounts of stevia may be best -sweetened beverages and artificially sweetened beverages -simple starches (rice, bread, potatoes, pasta, chips, etc - small amounts of 100% whole grains are ok) -red meat, pork, butter -fried foods, fast food, processed food, excessive dairy, eggs and coconut.  3)Get at least 150 minutes of sweaty aerobic exercise per week.  4)Reduce stress - consider counseling, meditation and relaxation to balance other aspects of your life. Health Maintenance, Female Adopting a healthy lifestyle and getting preventive care can go a long way to promote health and wellness. Talk with your health care provider about what schedule of regular examinations is right for you. This is a good chance for you to check in with your provider about disease prevention and staying healthy. In between checkups, there are plenty of things you can do on your own. Experts have done a lot of research about which lifestyle changes and preventive measures are most likely to keep you healthy. Ask your health care provider for more information. Weight and diet Eat a healthy diet  Be sure to include plenty of vegetables, fruits, low-fat dairy products, and lean protein.  Do not eat a lot of foods high in solid fats, added sugars, or salt.  Get  regular exercise. This is one of the most important things you can do for your health. ? Most adults should exercise for at least 150 minutes each week. The exercise should increase your heart rate and make you sweat (moderate-intensity exercise). ? Most adults should also do strengthening exercises at least twice a week. This is in addition to the moderate-intensity exercise.  Maintain a healthy weight  Body mass index (BMI) is a measurement that can be used to identify possible weight  problems. It estimates body fat based on height and weight. Your health care provider can help determine your BMI and help you achieve or maintain a healthy weight.  For females 17 years of age and older: ? A BMI below 18.5 is considered underweight. ? A BMI of 18.5 to 24.9 is normal. ? A BMI of 25 to 29.9 is considered overweight. ? A BMI of 30 and above is considered obese.  Watch levels of cholesterol and blood lipids  You should start having your blood tested for lipids and cholesterol at 29 years of age, then have this test every 5 years.  You may need to have your cholesterol levels checked more often if: ? Your lipid or cholesterol levels are high. ? You are older than 29 years of age. ? You are at high risk for heart disease.  Cancer screening Lung Cancer  Lung cancer screening is recommended for adults 52-58 years old who are at high risk for lung cancer because of a history of smoking.  A yearly low-dose CT scan of the lungs is recommended for people who: ? Currently smoke. ? Have quit within the past 15 years. ? Have at least a 30-pack-year history of smoking. A pack year is smoking an average of one pack of cigarettes a day for 1 year.  Yearly screening should continue until it has been 15 years since you quit.  Yearly screening should stop if you develop a health problem that would prevent you from having lung cancer treatment.  Breast Cancer  Practice breast self-awareness. This  means understanding how your breasts normally appear and feel.  It also means doing regular breast self-exams. Let your health care provider know about any changes, no matter how small.  If you are in your 20s or 30s, you should have a clinical breast exam (CBE) by a health care provider every 1-3 years as part of a regular health exam.  If you are 80 or older, have a CBE every year. Also consider having a breast X-ray (mammogram) every year.  If you have a family history of breast cancer, talk to your health care provider about genetic screening.  If you are at high risk for breast cancer, talk to your health care provider about having an MRI and a mammogram every year.  Breast cancer gene (BRCA) assessment is recommended for women who have family members with BRCA-related cancers. BRCA-related cancers include: ? Breast. ? Ovarian. ? Tubal. ? Peritoneal cancers.  Results of the assessment will determine the need for genetic counseling and BRCA1 and BRCA2 testing.  Cervical Cancer Your health care provider may recommend that you be screened regularly for cancer of the pelvic organs (ovaries, uterus, and vagina). This screening involves a pelvic examination, including checking for microscopic changes to the surface of your cervix (Pap test). You may be encouraged to have this screening done every 3 years, beginning at age 51.  For women ages 60-65, health care providers may recommend pelvic exams and Pap testing every 3 years, or they may recommend the Pap and pelvic exam, combined with testing for human papilloma virus (HPV), every 5 years. Some types of HPV increase your risk of cervical cancer. Testing for HPV may also be done on women of any age with unclear Pap test results.  Other health care providers may not recommend any screening for nonpregnant women who are considered low risk for pelvic cancer and who do not have symptoms. Ask your health care provider if a  screening pelvic exam  is right for you.  If you have had past treatment for cervical cancer or a condition that could lead to cancer, you need Pap tests and screening for cancer for at least 20 years after your treatment. If Pap tests have been discontinued, your risk factors (such as having a new sexual partner) need to be reassessed to determine if screening should resume. Some women have medical problems that increase the chance of getting cervical cancer. In these cases, your health care provider may recommend more frequent screening and Pap tests.  Colorectal Cancer  This type of cancer can be detected and often prevented.  Routine colorectal cancer screening usually begins at 29 years of age and continues through 29 years of age.  Your health care provider may recommend screening at an earlier age if you have risk factors for colon cancer.  Your health care provider may also recommend using home test kits to check for hidden blood in the stool.  A small camera at the end of a tube can be used to examine your colon directly (sigmoidoscopy or colonoscopy). This is done to check for the earliest forms of colorectal cancer.  Routine screening usually begins at age 47.  Direct examination of the colon should be repeated every 5-10 years through 29 years of age. However, you may need to be screened more often if early forms of precancerous polyps or small growths are found.  Skin Cancer  Check your skin from head to toe regularly.  Tell your health care provider about any new moles or changes in moles, especially if there is a change in a mole's shape or color.  Also tell your health care provider if you have a mole that is larger than the size of a pencil eraser.  Always use sunscreen. Apply sunscreen liberally and repeatedly throughout the day.  Protect yourself by wearing long sleeves, pants, a wide-brimmed hat, and sunglasses whenever you are outside.  Heart disease, diabetes, and high blood  pressure  High blood pressure causes heart disease and increases the risk of stroke. High blood pressure is more likely to develop in: ? People who have blood pressure in the high end of the normal range (130-139/85-89 mm Hg). ? People who are overweight or obese. ? People who are African American.  If you are 31-15 years of age, have your blood pressure checked every 3-5 years. If you are 67 years of age or older, have your blood pressure checked every year. You should have your blood pressure measured twice-once when you are at a hospital or clinic, and once when you are not at a hospital or clinic. Record the average of the two measurements. To check your blood pressure when you are not at a hospital or clinic, you can use: ? An automated blood pressure machine at a pharmacy. ? A home blood pressure monitor.  If you are between 28 years and 24 years old, ask your health care provider if you should take aspirin to prevent strokes.  Have regular diabetes screenings. This involves taking a blood sample to check your fasting blood sugar level. ? If you are at a normal weight and have a low risk for diabetes, have this test once every three years after 29 years of age. ? If you are overweight and have a high risk for diabetes, consider being tested at a younger age or more often. Preventing infection Hepatitis B  If you have a higher risk for hepatitis B,  you should be screened for this virus. You are considered at high risk for hepatitis B if: ? You were born in a country where hepatitis B is common. Ask your health care provider which countries are considered high risk. ? Your parents were born in a high-risk country, and you have not been immunized against hepatitis B (hepatitis B vaccine). ? You have HIV or AIDS. ? You use needles to inject street drugs. ? You live with someone who has hepatitis B. ? You have had sex with someone who has hepatitis B. ? You get hemodialysis  treatment. ? You take certain medicines for conditions, including cancer, organ transplantation, and autoimmune conditions.  Hepatitis C  Blood testing is recommended for: ? Everyone born from [birth year] through 1967. ? Anyone with known risk factors for hepatitis C.  Sexually transmitted infections (STIs)  You should be screened for sexually transmitted infections (STIs) including gonorrhea and chlamydia if: ? You are sexually active and are younger than 29 years of age. ? You are older than 29 years of age and your health care provider tells you that you are at risk for this type of infection. ? Your sexual activity has changed since you were last screened and you are at an increased risk for chlamydia or gonorrhea. Ask your health care provider if you are at risk.  If you do not have HIV, but are at risk, it may be recommended that you take a prescription medicine daily to prevent HIV infection. This is called pre-exposure prophylaxis (PrEP). You are considered at risk if: ? You are sexually active and do not regularly use condoms or know the HIV status of your partner(s). ? You take drugs by injection. ? You are sexually active with a partner who has HIV.  Talk with your health care provider about whether you are at high risk of being infected with HIV. If you choose to begin PrEP, you should first be tested for HIV. You should then be tested every 3 months for as long as you are taking PrEP. Pregnancy  If you are premenopausal and you may become pregnant, ask your health care provider about preconception counseling.  If you may become pregnant, take 400 to 800 micrograms (mcg) of folic acid every day.  If you want to prevent pregnancy, talk to your health care provider about birth control (contraception). Osteoporosis and menopause  Osteoporosis is a disease in which the bones lose minerals and strength with aging. This can result in serious bone fractures. Your risk for osteoporosis  can be identified using a bone density scan.  If you are 38 years of age or older, or if you are at risk for osteoporosis and fractures, ask your health care provider if you should be screened.  Ask your health care provider whether you should take a calcium or vitamin D supplement to lower your risk for osteoporosis.  Menopause may have certain physical symptoms and risks.  Hormone replacement therapy may reduce some of these symptoms and risks. Talk to your health care provider about whether hormone replacement therapy is right for you. Follow these instructions at home:  Schedule regular health, dental, and eye exams.  Stay current with your immunizations.  Do not use any tobacco products including cigarettes, chewing tobacco, or electronic cigarettes.  If you are pregnant, do not drink alcohol.  If you are breastfeeding, limit how much and how often you drink alcohol.  Limit alcohol intake to no more than 1 drink per day  for nonpregnant women. One drink equals 12 ounces of beer, 5 ounces of wine, or 1 ounces of hard liquor.  Do not use street drugs.  Do not share needles.  Ask your health care provider for help if you need support or information about quitting drugs.  Tell your health care provider if you often feel depressed.  Tell your health care provider if you have ever been abused or do not feel safe at home. This information is not intended to replace advice given to you by your health care provider. Make sure you discuss any questions you have with your health care provider. Document Released: 01/10/2011 Document Revised: 12/03/2015 Document Reviewed: 03/31/2015 Elsevier Interactive Patient Education  2018 [employer].         No follow-ups on file.  [name], DO

## 2018-02-19 ENCOUNTER — Ambulatory Visit (INDEPENDENT_AMBULATORY_CARE_PROVIDER_SITE_OTHER): Payer: Self-pay | Admitting: Family Medicine

## 2018-02-19 ENCOUNTER — Encounter: Payer: Self-pay | Admitting: Family Medicine

## 2018-02-19 VITALS — BP 98/78 | HR 74 | Temp 97.8°F | Ht 63.5 in | Wt 166.2 lb

## 2018-02-19 DIAGNOSIS — Z7189 Other specified counseling: Secondary | ICD-10-CM

## 2018-02-19 DIAGNOSIS — Z0000 Encounter for general adult medical examination without abnormal findings: Secondary | ICD-10-CM | POA: Diagnosis not present

## 2018-02-19 DIAGNOSIS — Z789 Other specified health status: Secondary | ICD-10-CM

## 2018-02-19 DIAGNOSIS — R400 Somnolence: Secondary | ICD-10-CM

## 2018-02-19 DIAGNOSIS — Z7185 Encounter for immunization safety counseling: Secondary | ICD-10-CM

## 2018-02-19 LAB — HEMOGLOBIN A1C: Hgb A1c MFr Bld: 5.7 % (ref 4.6–6.5)

## 2018-02-19 LAB — LIPID PANEL
Cholesterol: 181 mg/dL (ref 0–200)
HDL: 60.2 mg/dL (ref >39.00)
LDL Cholesterol: 106 mg/dL — ABNORMAL HIGH (ref 0–99)
NonHDL: 120.66
Total CHOL/HDL Ratio: 3
Triglycerides: 75 mg/dL (ref 0.0–149.0)
VLDL: 15 mg/dL (ref 0.0–40.0)

## 2018-02-19 NOTE — Patient Instructions (Addendum)
BEFORE YOU LEAVE: -labs -follow up: yearly and as needed  See your psychiatrist about depression  See your gynecologist for annual exam  Flu shot in October  We have ordered labs or studies at this visit. It can take up to 1-2 weeks for results and processing. IF results require follow up or explanation, we will call you with instructions. Clinically stable results will be released to your [facility]. If you have not heard from [place] or cannot find your results in [facility] in 2 weeks please contact our office at [phone].  If you are not yet signed up for [facility], please consider signing up.  -We placed a referral for you as discussed for sleep apnea evaluation. It usually takes about 1-2 weeks to process and schedule this referral. If you have not heard from [place] regarding this appointment in 2 weeks please contact our office.    We recommend the following healthy lifestyle for LIFE: 1) Small portions. But, make sure to get regular (at least 3 per day), healthy meals and small healthy snacks if needed.  2) Eat a healthy clean diet.   TRY TO EAT: -at least 5-7 servings of low sugar, colorful, and nutrient rich vegetables per day (not corn, potatoes or bananas.) -berries are the best choice if you wish to eat fruit (only eat small amounts if trying to reduce weight)  -lean meets (fish, white meat of chicken or [place]) -vegan proteins for some meals - beans or tofu, whole grains, nuts and seeds -Replace bad fats with good fats - good fats include: fish, nuts and seeds, canola oil, olive oil -small amounts of low fat or non fat dairy -small amounts of100 % whole grains - check the lables -drink plenty of water  AVOID: -SUGAR, sweets, anything with added sugar, corn syrup or sweeteners - must read labels as even foods advertised as "healthy" often are loaded with sugar -if you must have a sweetener, small amounts of stevia may be best -sweetened beverages and artificially sweetened  beverages -simple starches (rice, bread, potatoes, pasta, chips, etc - small amounts of 100% whole grains are ok) -red meat, pork, butter -fried foods, fast food, processed food, excessive dairy, eggs and coconut.  3)Get at least 150 minutes of sweaty aerobic exercise per week.  4)Reduce stress - consider counseling, meditation and relaxation to balance other aspects of your life. Health Maintenance, Female Adopting a healthy lifestyle and getting preventive care can go a long way to promote health and wellness. Talk with your health care provider about what schedule of regular examinations is right for you. This is a good chance for you to check in with your provider about disease prevention and staying healthy. In between checkups, there are plenty of things you can do on your own. Experts have done a lot of research about which lifestyle changes and preventive measures are most likely to keep you healthy. Ask your health care provider for more information. Weight and diet Eat a healthy diet  Be sure to include plenty of vegetables, fruits, low-fat dairy products, and lean protein.  Do not eat a lot of foods high in solid fats, added sugars, or salt.  Get regular exercise. This is one of the most important things you can do for your health. ? Most adults should exercise for at least 150 minutes each week. The exercise should increase your heart rate and make you sweat (moderate-intensity exercise). ? Most adults should also do strengthening exercises at least twice a week. This  is in addition to the moderate-intensity exercise.  Maintain a healthy weight  Body mass index (BMI) is a measurement that can be used to identify possible weight problems. It estimates body fat based on height and weight. Your health care provider can help determine your BMI and help you achieve or maintain a healthy weight.  For females 1 years of age and older: ? A BMI below 18.5 is considered  underweight. ? A BMI of 18.5 to 24.9 is normal. ? A BMI of 25 to 29.9 is considered overweight. ? A BMI of 30 and above is considered obese.  Watch levels of cholesterol and blood lipids  You should start having your blood tested for lipids and cholesterol at 29 years of age, then have this test every 5 years.  You may need to have your cholesterol levels checked more often if: ? Your lipid or cholesterol levels are high. ? You are older than 29 years of age. ? You are at high risk for heart disease.  Cancer screening Lung Cancer  Lung cancer screening is recommended for adults 84-66 years old who are at high risk for lung cancer because of a history of smoking.  A yearly low-dose CT scan of the lungs is recommended for people who: ? Currently smoke. ? Have quit within the past 15 years. ? Have at least a 30-pack-year history of smoking. A pack year is smoking an average of one pack of cigarettes a day for 1 year.  Yearly screening should continue until it has been 15 years since you quit.  Yearly screening should stop if you develop a health problem that would prevent you from having lung cancer treatment.  Breast Cancer  Practice breast self-awareness. This means understanding how your breasts normally appear and feel.  It also means doing regular breast self-exams. Let your health care provider know about any changes, no matter how small.  If you are in your 20s or 30s, you should have a clinical breast exam (CBE) by a health care provider every 1-3 years as part of a regular health exam.  If you are 24 or older, have a CBE every year. Also consider having a breast X-ray (mammogram) every year.  If you have a family history of breast cancer, talk to your health care provider about genetic screening.  If you are at high risk for breast cancer, talk to your health care provider about having an MRI and a mammogram every year.  Breast cancer gene (BRCA) assessment is  recommended for women who have family members with BRCA-related cancers. BRCA-related cancers include: ? Breast. ? Ovarian. ? Tubal. ? Peritoneal cancers.  Results of the assessment will determine the need for genetic counseling and BRCA1 and BRCA2 testing.  Cervical Cancer Your health care provider may recommend that you be screened regularly for cancer of the pelvic organs (ovaries, uterus, and vagina). This screening involves a pelvic examination, including checking for microscopic changes to the surface of your cervix (Pap test). You may be encouraged to have this screening done every 3 years, beginning at age 10.  For women ages 34-65, health care providers may recommend pelvic exams and Pap testing every 3 years, or they may recommend the Pap and pelvic exam, combined with testing for human papilloma virus (HPV), every 5 years. Some types of HPV increase your risk of cervical cancer. Testing for HPV may also be done on women of any age with unclear Pap test results.  Other health care providers  may not recommend any screening for nonpregnant women who are considered low risk for pelvic cancer and who do not have symptoms. Ask your health care provider if a screening pelvic exam is right for you.  If you have had past treatment for cervical cancer or a condition that could lead to cancer, you need Pap tests and screening for cancer for at least 20 years after your treatment. If Pap tests have been discontinued, your risk factors (such as having a new sexual partner) need to be reassessed to determine if screening should resume. Some women have medical problems that increase the chance of getting cervical cancer. In these cases, your health care provider may recommend more frequent screening and Pap tests.  Colorectal Cancer  This type of cancer can be detected and often prevented.  Routine colorectal cancer screening usually begins at 29 years of age and continues through 29 years of  age.  Your health care provider may recommend screening at an earlier age if you have risk factors for colon cancer.  Your health care provider may also recommend using home test kits to check for hidden blood in the stool.  A small camera at the end of a tube can be used to examine your colon directly (sigmoidoscopy or colonoscopy). This is done to check for the earliest forms of colorectal cancer.  Routine screening usually begins at age 62.  Direct examination of the colon should be repeated every 5-10 years through 29 years of age. However, you may need to be screened more often if early forms of precancerous polyps or small growths are found.  Skin Cancer  Check your skin from head to toe regularly.  Tell your health care provider about any new moles or changes in moles, especially if there is a change in a mole's shape or color.  Also tell your health care provider if you have a mole that is larger than the size of a pencil eraser.  Always use sunscreen. Apply sunscreen liberally and repeatedly throughout the day.  Protect yourself by wearing long sleeves, pants, a wide-brimmed hat, and sunglasses whenever you are outside.  Heart disease, diabetes, and high blood pressure  High blood pressure causes heart disease and increases the risk of stroke. High blood pressure is more likely to develop in: ? People who have blood pressure in the high end of the normal range (130-139/85-89 mm Hg). ? People who are overweight or obese. ? People who are African American.  If you are 59-33 years of age, have your blood pressure checked every 3-5 years. If you are 11 years of age or older, have your blood pressure checked every year. You should have your blood pressure measured twice-once when you are at a hospital or clinic, and once when you are not at a hospital or clinic. Record the average of the two measurements. To check your blood pressure when you are not at a hospital or clinic, you  can use: ? An automated blood pressure machine at a pharmacy. ? A home blood pressure monitor.  If you are between 43 years and 90 years old, ask your health care provider if you should take aspirin to prevent strokes.  Have regular diabetes screenings. This involves taking a blood sample to check your fasting blood sugar level. ? If you are at a normal weight and have a low risk for diabetes, have this test once every three years after 29 years of age. ? If you are overweight and have  a high risk for diabetes, consider being tested at a younger age or more often. Preventing infection Hepatitis B  If you have a higher risk for hepatitis B, you should be screened for this virus. You are considered at high risk for hepatitis B if: ? You were born in a country where hepatitis B is common. Ask your health care provider which countries are considered high risk. ? Your parents were born in a high-risk country, and you have not been immunized against hepatitis B (hepatitis B vaccine). ? You have HIV or AIDS. ? You use needles to inject street drugs. ? You live with someone who has hepatitis B. ? You have had sex with someone who has hepatitis B. ? You get hemodialysis treatment. ? You take certain medicines for conditions, including cancer, organ transplantation, and autoimmune conditions.  Hepatitis C  Blood testing is recommended for: ? Everyone born from [birth year] through 1967. ? Anyone with known risk factors for hepatitis C.  Sexually transmitted infections (STIs)  You should be screened for sexually transmitted infections (STIs) including gonorrhea and chlamydia if: ? You are sexually active and are younger than 29 years of age. ? You are older than 29 years of age and your health care provider tells you that you are at risk for this type of infection. ? Your sexual activity has changed since you were last screened and you are at an increased risk for chlamydia or gonorrhea. Ask your  health care provider if you are at risk.  If you do not have HIV, but are at risk, it may be recommended that you take a prescription medicine daily to prevent HIV infection. This is called pre-exposure prophylaxis (PrEP). You are considered at risk if: ? You are sexually active and do not regularly use condoms or know the HIV status of your partner(s). ? You take drugs by injection. ? You are sexually active with a partner who has HIV.  Talk with your health care provider about whether you are at high risk of being infected with HIV. If you choose to begin PrEP, you should first be tested for HIV. You should then be tested every 3 months for as long as you are taking PrEP. Pregnancy  If you are premenopausal and you may become pregnant, ask your health care provider about preconception counseling.  If you may become pregnant, take 400 to 800 micrograms (mcg) of folic acid every day.  If you want to prevent pregnancy, talk to your health care provider about birth control (contraception). Osteoporosis and menopause  Osteoporosis is a disease in which the bones lose minerals and strength with aging. This can result in serious bone fractures. Your risk for osteoporosis can be identified using a bone density scan.  If you are 26 years of age or older, or if you are at risk for osteoporosis and fractures, ask your health care provider if you should be screened.  Ask your health care provider whether you should take a calcium or vitamin D supplement to lower your risk for osteoporosis.  Menopause may have certain physical symptoms and risks.  Hormone replacement therapy may reduce some of these symptoms and risks. Talk to your health care provider about whether hormone replacement therapy is right for you. Follow these instructions at home:  Schedule regular health, dental, and eye exams.  Stay current with your immunizations.  Do not use any tobacco products including cigarettes, chewing  tobacco, or electronic cigarettes.  If you are pregnant, do  not drink alcohol.  If you are breastfeeding, limit how much and how often you drink alcohol.  Limit alcohol intake to no more than 1 drink per day for nonpregnant women. One drink equals 12 ounces of beer, 5 ounces of wine, or 1 ounces of hard liquor.  Do not use street drugs.  Do not share needles.  Ask your health care provider for help if you need support or information about quitting drugs.  Tell your health care provider if you often feel depressed.  Tell your health care provider if you have ever been abused or do not feel safe at home. This information is not intended to replace advice given to you by your health care provider. Make sure you discuss any questions you have with your health care provider. Document Released: 01/10/2011 Document Revised: 12/03/2015 Document Reviewed: 03/31/2015 Elsevier Interactive Patient Education  [employer].

## 2018-02-21 LAB — QUANTIFERON-TB GOLD PLUS
Mitogen-NIL: >10 IU/mL
NIL: 0.04 IU/mL
QuantiFERON-TB Gold Plus: NEGATIVE
TB1-NIL: 0 IU/mL
TB2-NIL: 0 IU/mL

## 2018-02-21 LAB — MEASLES/MUMPS/RUBELLA IMMUNITY
Mumps IgG: 72.5 AU/mL
Rubella: 5.03 index
Rubeola IgG: 41.8 AU/mL

## 2018-02-21 LAB — HIV ANTIBODY (ROUTINE TESTING W REFLEX): HIV 1&2 Ab, 4th Generation: NONREACTIVE

## 2018-02-21 LAB — HEPATITIS B SURFACE ANTIBODY, QUANTITATIVE: Hepatitis B-Post: 835 m[IU]/mL (ref >=10)

## 2018-02-21 LAB — VARICELLA ZOSTER ANTIBODY, IGG: Varicella IgG: 2860 index

## 2018-02-22 ENCOUNTER — Ambulatory Visit (INDEPENDENT_AMBULATORY_CARE_PROVIDER_SITE_OTHER): Payer: 59 | Admitting: Psychology

## 2018-02-22 DIAGNOSIS — F4010 Social phobia, unspecified: Secondary | ICD-10-CM | POA: Diagnosis not present

## 2018-02-22 DIAGNOSIS — F4310 Post-traumatic stress disorder, unspecified: Secondary | ICD-10-CM

## 2018-03-26 ENCOUNTER — Ambulatory Visit (INDEPENDENT_AMBULATORY_CARE_PROVIDER_SITE_OTHER): Payer: 59 | Admitting: Pulmonary Disease

## 2018-03-26 ENCOUNTER — Encounter: Payer: Self-pay | Admitting: Pulmonary Disease

## 2018-03-26 VITALS — BP 120/68 | HR 89 | Ht 64.0 in | Wt 162.0 lb

## 2018-03-26 DIAGNOSIS — R400 Somnolence: Secondary | ICD-10-CM

## 2018-03-26 NOTE — Patient Instructions (Signed)
Moderate probability of significant sleep disordered breathing  We will order home sleep study  We will see you in the office about 8 weeks following initiation of treatment as needed

## 2018-03-26 NOTE — Progress Notes (Signed)
Monica Ortega    [id number]    [birth year]  Primary Care Physician:Kim, Nickola Major, DO  Referring Physician: [name], DO [address] [place], [place] [address]  Chief complaint:   Patient with a history of daytime fatigue, daytime sleepiness  HPI:  Increasing daytime fatigue and sleepiness History of depression-she was on medications, currently off medications but continues to do relatively well Long-standing history of daytime sleepiness Has never been told that she snores No gasping respirations at night No dry mouth Memory has always been poor No difficulty concentrating She does have pets in the bedroom but does not believe that wake up at night Usually goes to bed about 11 30-12 30 Takes about half an hour to fall asleep Wakes up about 8 AM in the morning    Outpatient Encounter Medications as of 03/26/2018  Medication Sig  . aspirin-acetaminophen-caffeine (EXCEDRIN MIGRAINE) 250-250-65 MG tablet Take 2 tablets by mouth every 6 (six) hours as needed for headache.  . levonorgestrel-ethinyl estradiol (AVIANE,ALESSE,LESSINA) 0.1-20 MG-MCG tablet Take 1 tablet by mouth daily.  Marland Kitchen omega-3 acid ethyl esters (LOVAZA) 1 G capsule Take 1 g by mouth daily.  . Probiotic Product (PROBIOTIC FORMULA) CAPS Take 1 capsule by mouth daily as needed (for constipation).  . [DISCONTINUED] traZODone (DESYREL) 50 MG tablet Take 1 tablet (50 mg total) by mouth at bedtime as needed for sleep.   No facility-administered encounter medications on file as of 03/26/2018.     Allergies as of 03/26/2018  . (No Known Allergies)    Past Medical History:  Diagnosis Date  . Ankle sprain   . Anxiety   . Depression    denies hospitalization or medication    Past Surgical History:  Procedure Laterality Date  . TONSILLECTOMY    . WISDOM TOOTH EXTRACTION      Family History  Problem Relation Age of Onset  . Diabetes Mother   . Hypertension Father     Social History    Socioeconomic History  . Marital status: Significant Other    Spouse name: Not on file  . Number of children: Not on file  . Years of education: Not on file  . Highest education level: Not on file  Occupational History  . Not on file  Social Needs  . Financial resource strain: Not on file  . Food insecurity:    Worry: Not on file    Inability: Not on file  . Transportation needs:    Medical: Not on file    Non-medical: Not on file  Tobacco Use  . Smoking status: Never Smoker  . Smokeless tobacco: Never Used  Substance and Sexual Activity  . Alcohol use: No    Comment: denied alcohol  . Drug use: No    Comment: denied  . Sexual activity: Yes    Birth control/protection: Pill  Lifestyle  . Physical activity:    Days per week: Not on file    Minutes per session: Not on file  . Stress: Not on file  Relationships  . Social connections:    Talks on phone: Not on file    Gets together: Not on file    Attends religious service: Not on file    Active member of club or organization: Not on file    Attends meetings of clubs or organizations: Not on file    Relationship status: Not on file  . Intimate partner violence:    Fear of current  or ex partner: Not on file    Emotionally abused: Not on file    Physically abused: Not on file    Forced sexual activity: Not on file  Other Topics Concern  . Not on file  Social History Narrative   Work or Copywriter, advertising      Home Situation: lives with father and sister      Spiritual Beliefs: none, atheist      Lifestyle: 2-4 times per week jogging or elliptical or bike; diet is so so             Review of systems: Review of Systems  Constitutional: Negative for fever and chills.  HENT: Negative.   Eyes: Negative for blurred vision.  Respiratory: as per HPI  Cardiovascular: Negative for chest pain and palpitations.  Musculoskeletal: Negative for myalgias, back pain and joint pain.  Endo/Heme/Allergies: Negative  for environmental allergies.  Psychiatric/Behavioral: Depression.  All other systems reviewed and are negative.  Physical Exam:  Vitals:   03/26/18 1036  BP: 120/68  Pulse: 89  SpO2: 99%   Gen:      No acute distress HEENT:  EOMI, sclera anicteric Neck:     No masses; no thyromegaly Lungs:    Clear to auscultation bilaterally; normal respiratory effort CV:         Regular rate and rhythm; no murmurs Neuro: alert and oriented x 3 Psych: normal mood and affect  Assessment:  .  Moderate probability of significant sleep disordered breathing  .  History of depression  .  Nonrestorative sleep  .  Excessive daytime sleepiness  Plan/Recommendations:  .  We will schedule for home sleep study  .  Pathophysiology of sleep disordered breathing discussed with patient  .  Options of treatment discussed with patient  .  If study is negative, other contributors to patient's excessive daytime sleepiness need to be evaluated  .  I will see you back in the office in about 2 months  Sherrilyn Rist MD  Pulmonary and Critical Care 03/26/2018, 11:04 AM  CC: [name], DO

## 2018-04-06 DIAGNOSIS — Z6828 Body mass index (BMI) 28.0-28.9, adult: Secondary | ICD-10-CM | POA: Diagnosis not present

## 2018-04-06 DIAGNOSIS — Z01419 Encounter for gynecological examination (general) (routine) without abnormal findings: Secondary | ICD-10-CM | POA: Diagnosis not present

## 2018-04-12 DIAGNOSIS — Z23 Encounter for immunization: Secondary | ICD-10-CM | POA: Diagnosis not present

## 2018-04-16 ENCOUNTER — Telehealth: Payer: Self-pay | Admitting: *Deleted

## 2018-04-16 NOTE — Telephone Encounter (Signed)
04/16/2018-I called the pt and left a detailed message stating her chart was updated with the date of the flu shot given.  I also left a message for her to give her job a copy of the information that the pharmacy gave her with the date and info of the flu shot as we cannot send information in regards to a vaccine that was not given here and to call back with any questions. Wendie Simmer    Copied from [place] [phone]. Topic: General - Other >> Apr 16, 2018 11:01 AM Nils Flack wrote: Reason for CRM: pt called to let us know that she received her flu shot at [facility] on [address] of [address] on Thurs 04-12-18.  She had the regular flu shot.  She asked [facility] to fax a copy to the office for her chart.  She is also asking for copy of shot to be mailed to her so she can give to employer. Address on file correct

## 2018-04-18 DIAGNOSIS — R1909 Other intra-abdominal and pelvic swelling, mass and lump: Secondary | ICD-10-CM | POA: Diagnosis not present

## 2018-04-19 DIAGNOSIS — G4710 Hypersomnia, unspecified: Secondary | ICD-10-CM

## 2018-04-20 ENCOUNTER — Other Ambulatory Visit: Payer: Self-pay | Admitting: *Deleted

## 2018-04-20 DIAGNOSIS — R400 Somnolence: Secondary | ICD-10-CM

## 2018-04-27 ENCOUNTER — Ambulatory Visit (INDEPENDENT_AMBULATORY_CARE_PROVIDER_SITE_OTHER): Payer: 59 | Admitting: Psychology

## 2018-04-27 DIAGNOSIS — F341 Dysthymic disorder: Secondary | ICD-10-CM

## 2018-04-27 DIAGNOSIS — F4010 Social phobia, unspecified: Secondary | ICD-10-CM | POA: Diagnosis not present

## 2018-05-03 DIAGNOSIS — G4710 Hypersomnia, unspecified: Secondary | ICD-10-CM | POA: Diagnosis not present

## 2018-05-04 ENCOUNTER — Telehealth: Payer: Self-pay | Admitting: Pulmonary Disease

## 2018-05-04 NOTE — Telephone Encounter (Signed)
Dr. Ander Slade has reviewed the home sleep test this test was negative for sleep apnea.  Dr . Ander Slade recommendation are .Sleep posting omtimization by encouraging sleep in a lateral position elevating the head of the bed by 30 degrees may help.  Regular exercises will help premote good  Quality sleep .   Advise against driving while sleepy & against   Optimization of treatments for depression may help symptoms.    May do in lab study if symptoms if symptoms continue.  Patient is aware and verbalized understanding at this time nothing further at this time.

## 2018-05-09 ENCOUNTER — Ambulatory Visit: Payer: Self-pay | Admitting: *Deleted

## 2018-05-09 NOTE — Telephone Encounter (Signed)
Per MyChart message: pt consistently having some blood in her stool and requesting an appointment. Called patient to clarify how much blood she is having in her stool. She sometimes see in on the bm or when she wipes. It has been going on for a couple of weeks now. Denies the commode water turning red or clots.  She denies dizziness, abd pain or n/v. She has been trying to eat her probiotics to see if it would help but has not. Appointment scheduled per protocol. If increase in bleeding, with clots, dizziness, weakness to get to the emergency department, pt voiced understanding. Routing to flow at [facility] [place].  Reason for Disposition . MILD rectal bleeding (more than just a few drops or streaks)  Answer Assessment - Initial Assessment Questions 1. APPEARANCE of BLOOD: "What color is it?" "Is it passed separately, on the surface of the stool, or mixed in with the stool?"      Bright red 2. AMOUNT: "How much blood was passed?"      About a tablespoon 3. FREQUENCY: "How many times has blood been passed with the stools?"      Every times 4. ONSET: "When was the blood first seen in the stools?" (Days or weeks)       A couple weeks ago 5. DIARRHEA: "Is there also some diarrhea?" If so, ask: "How many diarrhea stools were passed in past 24 hours?"      No but has been a little soft 6. CONSTIPATION: "Do you have constipation?" If so, "How bad is it?"     no 7. RECURRENT SYMPTOMS: "Have you had blood in your stools before?" If so, ask: "When was the last time?" and "What happened that time?"      Several months back was a little bit 8. BLOOD THINNERS: "Do you take any blood thinners?" (e.g., Coumadin/warfarin, Pradaxa/dabigatran, aspirin)     no 9. OTHER SYMPTOMS: "Do you have any other symptoms?"  (e.g., abdominal pain, vomiting, dizziness, fever)     no 10. PREGNANCY: "Is there any chance you are pregnant?" "When was your last menstrual period?"       Not pregnant, LMP about 4  weeks  Protocols used: RECTAL BLEEDING-A-AH

## 2018-05-10 ENCOUNTER — Ambulatory Visit (INDEPENDENT_AMBULATORY_CARE_PROVIDER_SITE_OTHER): Payer: 59 | Admitting: Family Medicine

## 2018-05-10 ENCOUNTER — Encounter: Payer: Self-pay | Admitting: Family Medicine

## 2018-05-10 VITALS — BP 110/80 | HR 79 | Temp 98.3°F | Ht 64.0 in | Wt 164.7 lb

## 2018-05-10 DIAGNOSIS — K625 Hemorrhage of anus and rectum: Secondary | ICD-10-CM | POA: Diagnosis not present

## 2018-05-10 LAB — CBC
HCT: 41.4 % (ref 36.0–46.0)
Hemoglobin: 14.1 g/dL (ref 12.0–15.0)
MCHC: 34.1 g/dL (ref 30.0–36.0)
MCV: 84.5 fl (ref 78.0–100.0)
Platelets: 277 10*3/uL (ref 150.0–400.0)
RBC: 4.9 Mil/uL (ref 3.87–5.11)
RDW: 12.4 % (ref 11.5–15.5)
WBC: 5 10*3/uL (ref 4.0–10.5)

## 2018-05-10 NOTE — Patient Instructions (Addendum)
BEFORE YOU LEAVE: -lab -follow up: as needed for this  -We placed a referral for you as discussed to the gastroenterologist. It usually takes about 1-2 weeks to process and schedule this referral. If you have not heard from [place] regarding this appointment in 2 weeks please contact our office.     Rectal Bleeding Rectal bleeding is when blood comes out of the opening of the butt (anus). People with this kind of bleeding may notice bright red blood in their underwear or in the toilet after they poop (have a bowel movement). They may also have dark red or black poop (stool). Rectal bleeding is often a sign that something is wrong. It needs to be checked by a doctor. Follow these instructions at home: Watch for any changes in your condition. Take these actions to help with bleeding and discomfort:  Eat a diet that is high in fiber. This will keep your poop soft so it is easier for you to poop without pushing too hard. Ask your doctor to tell you what foods and drinks are high in fiber.  Drink enough fluid to keep your pee (urine) clear or pale yellow. This also helps keep your poop soft.  Try taking a warm bath. This may help with pain.  Keep all follow-up visits as told by your doctor. This is important.  Get help right away if:  You have new bleeding.  You have more bleeding than before.  You have black or dark red poop.  You throw up (vomit) blood or something that looks like coffee grounds.  You have pain or tenderness in your belly (abdomen).  You have a fever.  You feel weak.  You feel sick to your stomach (nauseous).  You pass out (faint).  You have very bad pain in your butt.  You cannot poop. This information is not intended to replace advice given to you by your health care provider. Make sure you discuss any questions you have with your health care provider. Document Released: 03/09/2011 Document Revised: 12/03/2015 Document Reviewed: 08/23/2015 Elsevier  Interactive Patient Education  [employer].

## 2018-05-10 NOTE — Progress Notes (Signed)
HPI:  Using dictation device. Unfortunately this device frequently misinterprets words/phrases.  Acute visit for:  BRBPR: -for 2-3 weeks daily -only occurs with BM - with small amount of blood on TP, toilet or stool -no abd pain, fevers, sig pain, diarrhea or constipation -did have maybe a little discomfort in rectal area initially - but was mild -did just start period and have fairly heavy day of menstrual bleeding today -hx of similar but mild symptoms in the past over a year ago - stool, celiac and blood count testing normal at the time  ROS: See pertinent positives and negatives per HPI.  Past Medical History:  Diagnosis Date  . Ankle sprain   . Anxiety   . Depression    denies hospitalization or medication    Past Surgical History:  Procedure Laterality Date  . TONSILLECTOMY    . WISDOM TOOTH EXTRACTION      Family History  Problem Relation Age of Onset  . Diabetes Mother   . Hypertension Father     SOCIAL HX: see hpi   Current Outpatient Medications:  .  aspirin-acetaminophen-caffeine (EXCEDRIN MIGRAINE) 250-250-65 MG tablet, Take 2 tablets by mouth every 6 (six) hours as needed for headache., Disp: , Rfl:  .  levonorgestrel-ethinyl estradiol (AVIANE,ALESSE,LESSINA) 0.1-20 MG-MCG tablet, Take 1 tablet by mouth daily., Disp: , Rfl:  .  omega-3 acid ethyl esters (LOVAZA) 1 G capsule, Take 1 g by mouth daily., Disp: , Rfl:  .  Probiotic Product (PROBIOTIC FORMULA) CAPS, Take 1 capsule by mouth daily as needed (for constipation)., Disp: , Rfl:   EXAM:  Vitals:   05/10/18 1346  BP: 110/80  Pulse: 79  Temp: 98.3 F (36.8 C)  SpO2: 97%    Body mass index is 28.27 kg/m.  GENERAL: vitals reviewed and listed above, alert, oriented, appears well hydrated and in no acute distress  HEENT: atraumatic, conjunttiva clear, no obvious abnormalities on inspection of external nose and ears  NECK: no obvious masses on inspection  GU: no lesions on exam, no blood,  discomfort or lesions palpated on DRE  PSYCH: pleasant and cooperative, no obvious depression or anxiety  ASSESSMENT AND PLAN:  Discussed the following assessment and plan:  BRBPR (bright red blood per rectum) - Plan: Ambulatory referral to Gastroenterology, CBC   -we discussed possible serious and likely etiologies, workup and treatment, treatment risks and return precautions; given recurrent issues will refer to GI for eval, no blood on exam glove today and no appreciable lesions on exam today -after this discussion, Monica Ortega opted for see above -follow up advised as needed here in the interim, emergency precuations -of course, we advised Monica Ortega  to return or notify a doctor immediately if symptoms worsen or persist or new concerns arise.   Patient Instructions  BEFORE YOU LEAVE: -lab -follow up: as needed for this  -We placed a referral for you as discussed to the gastroenterologist. It usually takes about 1-2 weeks to process and schedule this referral. If you have not heard from [place] regarding this appointment in 2 weeks please contact our office.     Rectal Bleeding Rectal bleeding is when blood comes out of the opening of the butt (anus). People with this kind of bleeding may notice bright red blood in their underwear or in the toilet after they poop (have a bowel movement). They may also have dark red or black poop (stool). Rectal bleeding is often a sign that something is wrong. It needs to be checked by a  doctor. Follow these instructions at home: Watch for any changes in your condition. Take these actions to help with bleeding and discomfort:  Eat a diet that is high in fiber. This will keep your poop soft so it is easier for you to poop without pushing too hard. Ask your doctor to tell you what foods and drinks are high in fiber.  Drink enough fluid to keep your pee (urine) clear or pale yellow. This also helps keep your poop soft.  Try taking a warm bath. This may help  with pain.  Keep all follow-up visits as told by your doctor. This is important.  Get help right away if:  You have new bleeding.  You have more bleeding than before.  You have black or dark red poop.  You throw up (vomit) blood or something that looks like coffee grounds.  You have pain or tenderness in your belly (abdomen).  You have a fever.  You feel weak.  You feel sick to your stomach (nauseous).  You pass out (faint).  You have very bad pain in your butt.  You cannot poop. This information is not intended to replace advice given to you by your health care provider. Make sure you discuss any questions you have with your health care provider. Document Released: 03/09/2011 Document Revised: 12/03/2015 Document Reviewed: 08/23/2015 Elsevier Interactive Patient Education  2018 [place], DO

## 2018-05-11 ENCOUNTER — Encounter: Payer: Self-pay | Admitting: Internal Medicine

## 2018-06-05 ENCOUNTER — Encounter: Payer: Self-pay | Admitting: Internal Medicine

## 2018-06-05 ENCOUNTER — Ambulatory Visit: Payer: 59 | Admitting: Internal Medicine

## 2018-06-05 VITALS — BP 100/60 | HR 60 | Ht 64.0 in | Wt 162.0 lb

## 2018-06-05 DIAGNOSIS — K625 Hemorrhage of anus and rectum: Secondary | ICD-10-CM | POA: Diagnosis not present

## 2018-06-05 DIAGNOSIS — K649 Unspecified hemorrhoids: Secondary | ICD-10-CM

## 2018-06-05 MED ORDER — HYDROCORTISONE ACETATE 25 MG RE SUPP: 25.0000 mg | Freq: Every day | RECTAL | 3 refills | Status: DC

## 2018-06-05 NOTE — Progress Notes (Signed)
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 Maudie Mercury regarding rectal bleeding.  The patient tells me that she had some transient rectal bleeding earlier this year.  This resolved until 1 month ago when she developed recurrent rectal bleeding is manifested by bright red blood generally on the tissue.  Occasionally in the bowl.  Often with constipated bowel movement.  Occasionally associated with rectal discomfort.  Most recently had blood this morning.  No rectal discomfort recently.  She was evaluated by Dr. Maudie Mercury.  Rectal exam not reveal any particular abnormalities.  CBC was obtained May 10, 2018.  Normal hemoglobin of 14.1.  Unchanged from previous years.  No family history of inflammatory bowel disease or colon cancer.  Her weight has been stable.  No abdominal pain  REVIEW OF SYSTEMS:  All non-GI ROS negative unless otherwise stated in the HPI except for anxiety, depression, fatigue  Past Medical History:  Diagnosis Date  . Ankle sprain   . Anxiety   . Depression    denies hospitalization or medication    Past Surgical History:  Procedure Laterality Date  . WISDOM TOOTH EXTRACTION      Social History Monica Ortega  reports that she has never smoked. She has never used smokeless tobacco. She reports that she does not drink alcohol or use drugs.  family history includes Diabetes in her mother; Hypertension in her father.  No Known Allergies     PHYSICAL EXAMINATION: Vital signs: BP 100/60   Pulse 60   Ht 5' 4"  (1.626 m)   Wt 162 lb (73.5 kg)   LMP 05/09/2018 (Exact Date)   BMI 27.81 kg/m   Constitutional: generally well-appearing, no acute distress Psychiatric: alert and oriented x3, cooperative Eyes: extraocular movements intact, anicteric, conjunctiva pink Mouth: oral pharynx moist, no lesions Neck: supple no lymphadenopathy Cardiovascular: heart regular rate and rhythm, no murmur Lungs: clear to  auscultation bilaterally Abdomen: soft, nontender, nondistended, no obvious ascites, no peritoneal signs, normal bowel sounds, no organomegaly Rectal: No external abnormalities.  No tenderness or obvious fissure. Extremities: no lower extremity edema bilaterally Skin: no lesions on visible extremities Neuro: No focal deficits. No asterixis.  Anoscopy Friable internal hemorrhoids with fresh blood   ASSESSMENT:  1.  Rectal bleeding secondary to internal hemorrhoids   PLAN:  1.  Metamucil 2 tablespoons daily to maximize the bowel consistency 2.  Anusol HC suppositories at night 3.  Contact this office if rectal bleeding does not resolve 4.  Resume general medical care with Dr. Maudie Mercury

## 2018-06-05 NOTE — Patient Instructions (Signed)
We have sent the following medications to your pharmacy for you to pick up at your convenience: Anusol [facility] suppositories  Use Metamcucil - 2 tablespoons daily  Follow up as needed

## 2018-06-12 ENCOUNTER — Ambulatory Visit: Payer: 59 | Admitting: Psychology

## 2018-06-21 ENCOUNTER — Ambulatory Visit (INDEPENDENT_AMBULATORY_CARE_PROVIDER_SITE_OTHER): Payer: 59 | Admitting: Psychology

## 2018-06-21 DIAGNOSIS — F4010 Social phobia, unspecified: Secondary | ICD-10-CM | POA: Diagnosis not present

## 2018-06-21 DIAGNOSIS — F341 Dysthymic disorder: Secondary | ICD-10-CM | POA: Diagnosis not present

## 2018-07-30 ENCOUNTER — Ambulatory Visit (INDEPENDENT_AMBULATORY_CARE_PROVIDER_SITE_OTHER): Payer: 59 | Admitting: Psychology

## 2018-07-30 DIAGNOSIS — F341 Dysthymic disorder: Secondary | ICD-10-CM | POA: Diagnosis not present

## 2018-07-30 DIAGNOSIS — F4010 Social phobia, unspecified: Secondary | ICD-10-CM

## 2018-09-10 ENCOUNTER — Ambulatory Visit: Payer: 59 | Admitting: Psychology

## 2018-09-15 ENCOUNTER — Ambulatory Visit
Admission: EM | Admit: 2018-09-15 | Discharge: 2018-09-15 | Disposition: A | Discharge status: Home / Self Care | Payer: Self-pay | Attending: Family Medicine | Admitting: Family Medicine

## 2018-09-15 ENCOUNTER — Other Ambulatory Visit: Payer: Self-pay

## 2018-09-15 DIAGNOSIS — R197 Diarrhea, unspecified: Secondary | ICD-10-CM

## 2018-09-15 DIAGNOSIS — K120 Recurrent oral aphthae: Secondary | ICD-10-CM

## 2018-09-15 MED ORDER — LIDOCAINE VISCOUS HCL 2 % MT SOLN: 10.0000 mL | OROMUCOSAL | 0 refills | Status: DC | PRN

## 2018-09-15 NOTE — Discharge Instructions (Addendum)
Your diarrhea appear to have a viral cause. Your symptoms should improve over the next week as your body continues to rid the infectious cause.  For Diarrhea: This is your body's natural way of getting rid of a virus. You may try taking 1 imodium to decrease amount of stools a day, but we do not want you to stop your diarrhea.   Preventing dehydration is key! You need to replace the fluid your body is expelling. Drink plenty of fluids, may use Pedialyte or sports drinks.   Please return if you are experiencing blood in your vomit or stool or experiencing dizziness, lightheadedness, extreme fatigue, increased abdominal pain.   May use lidocaine swish in mouth to help with pain of canker sores before meals

## 2018-09-15 NOTE — ED Provider Notes (Signed)
[facility]    CSN: [id number] Arrival date & time: 09/15/18  1136     History   Chief Complaint Chief Complaint  Patient presents with  . Diarrhea    HPI Monica Ortega is a 28 y.o. female no significant past medical history presenting today for evaluation of diarrhea and mouth sores.  Patient states that beginning Wednesday she started to develop diarrhea which is persisted over the past few days.  She denies associated nausea or vomiting.  She has had some mild abdominal discomfort.  She is concerned as she notes that she has had blood in bowel movements for the past few months.  She was evaluated by gastroenterology back in November and noted bleeding to be from hemorrhoids.  She denies any rectal pain.  Blood continues to persist with diarrhea.  Denies previous colonoscopy.  Denies family history of colon cancer.  Eating and drinking like normal.  Denies associated URI symptoms.  Denies fevers.  Is also noticed sores in her mouth over the past couple days as well.  Associated with pain.  Has not applied anything to these areas.  HPI  Past Medical History:  Diagnosis Date  . Ankle sprain   . Anxiety   . Depression    denies hospitalization or medication    Patient Active Problem List   Diagnosis Date Noted  . MDD (major depressive disorder), recurrent severe, without psychosis ([place]) 04/16/2015    Past Surgical History:  Procedure Laterality Date  . WISDOM TOOTH EXTRACTION      OB History   No obstetric history on file.      Home Medications    Prior to Admission medications   Medication Sig Start Date End Date Taking? Authorizing Provider  aspirin-acetaminophen-caffeine (EXCEDRIN MIGRAINE) [phone] MG tablet Take 2 tablets by mouth every 6 (six) hours as needed for headache.    [provider]  hydrocortisone (ANUSOL-HC) 25 MG suppository Place 1 suppository (25 mg total) rectally at bedtime. 06/05/18   Irene Shipper, MD   levonorgestrel-ethinyl estradiol (AVIANE,ALESSE,LESSINA) 0.1-20 MG-MCG tablet Take 1 tablet by mouth daily.    [provider]  lidocaine (XYLOCAINE) 2 % solution Use as directed 10 mLs in the mouth or throat as needed for mouth pain. 09/15/18   Wieters, Hallie C, PA-C  omega-3 acid ethyl esters (LOVAZA) 1 G capsule Take 1 g by mouth daily.    [provider]  Probiotic Product (PROBIOTIC FORMULA) CAPS Take 1 capsule by mouth daily as needed (for constipation).    [provider]    Family History Family History  Problem Relation Age of Onset  . Diabetes Mother   . Hypertension Father     Social History Social History   Tobacco Use  . Smoking status: Never Smoker  . Smokeless tobacco: Never Used  Substance Use Topics  . Alcohol use: No    Comment: denied alcohol  . Drug use: No    Comment: denied     Allergies   Patient has no known allergies.   Review of Systems Review of Systems  Constitutional: Negative for activity change, appetite change, chills, fatigue and fever.  HENT: Positive for mouth sores. Negative for congestion, ear pain, rhinorrhea, sinus pressure, sore throat and trouble swallowing.   Eyes: Negative for discharge and redness.  Respiratory: Negative for cough, chest tightness and shortness of breath.   Cardiovascular: Negative for chest pain.  Gastrointestinal: Positive for abdominal pain, blood in stool and diarrhea. Negative for nausea  and vomiting.  Musculoskeletal: Negative for myalgias.  Skin: Negative for rash.  Neurological: Negative for dizziness, light-headedness and headaches.     Physical Exam Triage Vital Signs ED Triage Vitals  Enc Vitals Group     BP 09/15/18 1144 131/86     Pulse Rate 09/15/18 1144 92     Resp 09/15/18 1144 18     Temp 09/15/18 1144 (!) 97.4 F (36.3 C)     Temp Source 09/15/18 1144 Oral     SpO2 09/15/18 1144 98 %     Weight 09/15/18 1145 155 lb (70.3 kg)     Height 09/15/18 1145 5' 4"   (1.626 m)     Head Circumference --      Peak Flow --      Pain Score 09/15/18 1145 5     Pain Loc --      Pain Edu? --      Excl. in [place]? --    No data found.  Updated Vital Signs BP 131/86 (BP Location: Left Arm)   Pulse 92   Temp (!) 97.4 F (36.3 C) (Oral)   Resp 18   Ht 5' 4"  (1.626 m)   Wt 155 lb (70.3 kg)   LMP 08/27/2018   SpO2 98%   BMI 26.61 kg/m   Visual Acuity Right Eye Distance:   Left Eye Distance:   Bilateral Distance:    Right Eye Near:   Left Eye Near:    Bilateral Near:     Physical Exam Vitals signs and nursing note reviewed.  Constitutional:      General: She is not in acute distress.    Appearance: She is well-developed.  HENT:     Head: Normocephalic and atraumatic.     Mouth/Throat:     Comments: Oral mucosa and tongue with multiple white plaque lesions with surrounding erythema to lower lip mucosa, upper lip mucosa and tip of tongue  No lesions on soft palate or posterior pharynx, posterior pharynx patent without erythema, no uvula swelling or deviation, no tonsillar enlargement or erythema or exudate. Eyes:     Conjunctiva/sclera: Conjunctivae normal.  Neck:     Musculoskeletal: Neck supple.  Cardiovascular:     Rate and Rhythm: Normal rate and regular rhythm.     Heart sounds: No murmur.  Pulmonary:     Effort: Pulmonary effort is normal. No respiratory distress.     Breath sounds: Normal breath sounds.     Comments: Breathing comfortably at rest, CTABL, no wheezing, rales or other adventitious sounds auscultated Abdominal:     Palpations: Abdomen is soft.     Tenderness: There is abdominal tenderness.     Comments: Mild generalized tenderness throughout abdomen, no distention, no focal tenderness, negative rebound, negative McBurney's, negative Rovsing, negative Murphy's  Genitourinary:    Comments: Normal external rectum, no external hemorrhoids, no rectal tenderness No masses or gross blood observed with digital rectal  exam Skin:    General: Skin is warm and dry.  Neurological:     Mental Status: She is alert.      UC Treatments / Results  Labs (all labs ordered are listed, but only abnormal results are displayed) Labs Reviewed - No data to display  EKG None  Radiology No results found.  Procedures Procedures (including critical care time)  Medications Ordered in UC Medications - No data to display  Initial Impression / Assessment and Plan / UC Course  I have reviewed the triage vital signs and the  nursing notes.  Pertinent labs & imaging results that were available during my care of the patient were reviewed by me and considered in my medical decision making (see chart for details).     Diarrhea x3 days, abdominal exam negative for peritoneal signs, vital signs stable, most likely viral etiology.  Will treat symptomatically and supportively for now but will continue to monitor the abdominal pain and bleeding.  Pepto-Bismol as needed, advised against stopping bowel movements.  Monitor abdominal pain.  Follow-up with GI for persistent bleeding.  Patient appears to have aphthous ulcers/canker sores in mouth.  Likely viral and likely resolve in 1 to 2 weeks.  Will treat symptomatically with viscous lidocaine as needed.  Continue to monitor,Discussed strict return precautions. Patient verbalized understanding and is agreeable with plan.  Final Clinical Impressions(s) / UC Diagnoses   Final diagnoses:  Diarrhea, unspecified type  Canker sore     Discharge Instructions     Your diarrhea appear to have a viral cause. Your symptoms should improve over the next week as your body continues to rid the infectious cause.  For Diarrhea: This is your body's natural way of getting rid of a virus. You may try taking 1 imodium to decrease amount of stools a day, but we do not want you to stop your diarrhea.   Preventing dehydration is key! You need to replace the fluid your body is expelling.  Drink plenty of fluids, may use Pedialyte or sports drinks.   Please return if you are experiencing blood in your vomit or stool or experiencing dizziness, lightheadedness, extreme fatigue, increased abdominal pain.   May use lidocaine swish in mouth to help with pain of canker sores before meals   ED Prescriptions    Medication Sig Dispense Auth. Provider   lidocaine (XYLOCAINE) 2 % solution Use as directed 10 mLs in the mouth or throat as needed for mouth pain. 100 mL Wieters, Hallie C, PA-C     Controlled Substance Prescriptions [place] Controlled Substance Registry consulted? Not Applicable   Janith [place], [place] 09/15/18 1426

## 2018-09-15 NOTE — ED Triage Notes (Addendum)
Per pt has been having diarrhea since Wednesday with painful cancer sore in her mouth. Lower abdominal pain. Pt has been taking pepto. No fevers nor chills.

## 2018-09-19 ENCOUNTER — Other Ambulatory Visit: Payer: Self-pay

## 2018-09-19 ENCOUNTER — Emergency Department (HOSPITAL_COMMUNITY)
Admission: EM | Admit: 2018-09-19 | Discharge: 2018-09-19 | Disposition: A | Discharge status: Home / Self Care | Payer: Self-pay | Attending: Emergency Medicine | Admitting: Emergency Medicine

## 2018-09-19 ENCOUNTER — Encounter (HOSPITAL_COMMUNITY): Payer: Self-pay

## 2018-09-19 ENCOUNTER — Emergency Department (HOSPITAL_COMMUNITY): Payer: Self-pay

## 2018-09-19 DIAGNOSIS — Z79899 Other long term (current) drug therapy: Secondary | ICD-10-CM | POA: Insufficient documentation

## 2018-09-19 DIAGNOSIS — R197 Diarrhea, unspecified: Secondary | ICD-10-CM

## 2018-09-19 DIAGNOSIS — K529 Noninfective gastroenteritis and colitis, unspecified: Secondary | ICD-10-CM

## 2018-09-19 LAB — LIPASE, BLOOD: Lipase: 117 U/L — ABNORMAL HIGH (ref 11–51)

## 2018-09-19 LAB — COMPREHENSIVE METABOLIC PANEL
ALT: 23 U/L (ref 0–44)
AST: 21 U/L (ref 15–41)
Albumin: 2.9 g/dL — ABNORMAL LOW (ref 3.5–5.0)
Alkaline Phosphatase: 66 U/L (ref 38–126)
Anion gap: 7 (ref 5–15)
BUN: 5 mg/dL — ABNORMAL LOW (ref 6–20)
CO2: 25 mmol/L (ref 22–32)
Calcium: 8.5 mg/dL — ABNORMAL LOW (ref 8.9–10.3)
Chloride: 105 mmol/L (ref 98–111)
Creatinine, Ser: 0.85 mg/dL (ref 0.44–1.00)
GFR calc Af Amer: >60 mL/min (ref >60)
GFR calc non Af Amer: >60 mL/min (ref >60)
Glucose, Bld: 88 mg/dL (ref 70–99)
Potassium: 3.7 mmol/L (ref 3.5–5.1)
Sodium: 137 mmol/L (ref 135–145)
Total Bilirubin: 0.5 mg/dL (ref 0.3–1.2)
Total Protein: 6.1 g/dL — ABNORMAL LOW (ref 6.5–8.1)

## 2018-09-19 LAB — URINALYSIS, ROUTINE W REFLEX MICROSCOPIC
Bilirubin Urine: NEGATIVE
Glucose, UA: NEGATIVE mg/dL
Ketones, ur: NEGATIVE mg/dL
Leukocytes,Ua: NEGATIVE
Nitrite: NEGATIVE
Protein, ur: NEGATIVE mg/dL
Specific Gravity, Urine: 1.014 (ref 1.005–1.030)
pH: 8 (ref 5.0–8.0)

## 2018-09-19 LAB — I-STAT BETA HCG BLOOD, ED (MC, WL, AP ONLY): I-stat hCG, quantitative: <5 m[IU]/mL (ref <5)

## 2018-09-19 LAB — CBC
HCT: 41.9 % (ref 36.0–46.0)
Hemoglobin: 13.2 g/dL (ref 12.0–15.0)
MCH: 27.2 pg (ref 26.0–34.0)
MCHC: 31.5 g/dL (ref 30.0–36.0)
MCV: 86.2 fL (ref 80.0–100.0)
Platelets: 339 10*3/uL (ref 150–400)
RBC: 4.86 MIL/uL (ref 3.87–5.11)
RDW: 12.5 % (ref 11.5–15.5)
WBC: 7.4 10*3/uL (ref 4.0–10.5)
nRBC: 0 % (ref 0.0–0.2)

## 2018-09-19 MED ORDER — IOHEXOL 300 MG/ML  SOLN
100.0000 mL | Freq: Once | INTRAMUSCULAR | Status: AC | PRN
  Administered 2018-09-19: 100 mL via INTRAVENOUS

## 2018-09-19 MED ORDER — SODIUM CHLORIDE 0.9% FLUSH
3.0000 mL | Freq: Once | INTRAVENOUS | Status: AC
  Administered 2018-09-19: 3 mL via INTRAVENOUS

## 2018-09-19 MED ORDER — IOHEXOL 300 MG/ML  SOLN: 100.0000 mL | Freq: Once | INTRAMUSCULAR | Status: DC | PRN

## 2018-09-19 MED ORDER — ONDANSETRON HCL 4 MG/2ML IJ SOLN
4.0000 mg | Freq: Once | INTRAMUSCULAR | Status: DC
  Filled 2018-09-19: qty 2

## 2018-09-19 MED ORDER — MORPHINE SULFATE (PF) 4 MG/ML IV SOLN
4.0000 mg | Freq: Once | INTRAVENOUS | Status: AC
  Administered 2018-09-19: 4 mg via INTRAVENOUS
  Filled 2018-09-19: qty 1

## 2018-09-19 MED ORDER — HYDROCODONE-ACETAMINOPHEN 5-325 MG PO TABS: 1.0000 | ORAL_TABLET | Freq: Four times a day (QID) | ORAL | 0 refills | Status: DC | PRN

## 2018-09-19 NOTE — ED Notes (Addendum)
Pt currently not nauseated  Will place Zofran on hold

## 2018-09-19 NOTE — ED Provider Notes (Signed)
[facility] EMERGENCY DEPARTMENT Provider Note   CSN: [id number] Arrival date & time: 09/19/18  1315    History   Chief Complaint Chief Complaint  Patient presents with  . Abdominal Pain    HPI Monica Ortega is a 28 y.o. female.     The history is provided by the patient and medical records. No language interpreter was used.  Abdominal Pain  Associated symptoms: diarrhea   Associated symptoms: no nausea and no vomiting     Monica Ortega is a 28 y.o. female who presents to the Emergency Department complaining of diffuse central to lower abdominal pain over the last week.  Associated with loose stools several times a day.  She does report blood in her stool, but states that has been an ongoing issue for her for about 6 months.  She saw GI doctor who told her it was likely due to her hemorrhoids.  She states that she was supposed to follow-up with them if it did not improve, but she is in between insurances right now and will get coverage on April 1, therefore was hoping to wait until then to schedule the appointment.  She [facility] care at [facility] on the [facility] who thought this was likely viral.  Recommended supportive measures and gave her strict precautions.  She thought if this was viral, it would get better, but since it is not, came to the emergency department.  She has tried bupropion, Excedrin and Imodium with little improvement.  Denies nausea or vomiting.  No chest pain, shortness of breath or back pain.  No fevers.  No urinary symptoms.  No previous abdominal surgeries.  Drinks a few ciders every week or 2.    Past Medical History:  Diagnosis Date  . Ankle sprain   . Anxiety   . Depression    denies hospitalization or medication    Patient Active Problem List   Diagnosis Date Noted  . MDD (major depressive disorder), recurrent severe, without psychosis ([place]) 04/16/2015    Past Surgical History:  Procedure Laterality Date  . WISDOM TOOTH EXTRACTION        OB History   No obstetric history on file.      Home Medications    Prior to Admission medications   Medication Sig Start Date End Date Taking? Authorizing Provider  aspirin-acetaminophen-caffeine (EXCEDRIN MIGRAINE) [phone] MG tablet Take 2 tablets by mouth every 6 (six) hours as needed for headache.   Yes [provider]  hydrocortisone (ANUSOL-HC) 25 MG suppository Place 1 suppository (25 mg total) rectally at bedtime. 06/05/18  Yes Irene Shipper, MD  ibuprofen (ADVIL,MOTRIN) 200 MG tablet Take 400 mg by mouth every 6 (six) hours as needed for moderate pain.   Yes [provider]  levonorgestrel-ethinyl estradiol (AVIANE,ALESSE,LESSINA) 0.1-20 MG-MCG tablet Take 1 tablet by mouth daily.   Yes [provider]  omega-3 acid ethyl esters (LOVAZA) 1 G capsule Take 1 g by mouth daily.   Yes [provider]  Probiotic Product (PROBIOTIC FORMULA) CAPS Take 1 capsule by mouth daily as needed (for constipation).   Yes [provider]  HYDROcodone-acetaminophen (NORCO) 5-325 MG tablet Take 1 tablet by mouth every 6 (six) hours as needed for moderate pain. 09/19/18   Ward, Ozella Almond, PA-C  lidocaine (XYLOCAINE) 2 % solution Use as directed 10 mLs in the mouth or throat as needed for mouth pain. Patient not taking: Reported on 09/19/2018 09/15/18   Janith Lima, PA-C  Family History Family History  Problem Relation Age of Onset  . Diabetes Mother   . Hypertension Father     Social History Social History   Tobacco Use  . Smoking status: Never Smoker  . Smokeless tobacco: Never Used  Substance Use Topics  . Alcohol use: No    Comment: denied alcohol  . Drug use: No    Comment: denied     Allergies   Patient has no known allergies.   Review of Systems Review of Systems  Gastrointestinal: Positive for abdominal pain, blood in stool and diarrhea. Negative for nausea and vomiting.  All other systems reviewed and are  negative.    Physical Exam Updated Vital Signs BP 124/78 (BP Location: Right Arm) Comment: Simultaneous filing. User may not have seen previous data.  Pulse 93 Comment: Simultaneous filing. User may not have seen previous data.  Temp 98.7 F (37.1 C) (Oral)   Resp 16   Ht 5' 4"  (1.626 m)   Wt 70.3 kg   LMP 08/27/2018   SpO2 98% Comment: Simultaneous filing. User may not have seen previous data.  BMI 26.61 kg/m   Physical Exam Vitals signs and nursing note reviewed.  Constitutional:      General: She is not in acute distress.    Appearance: She is well-developed.     Comments: Non-toxic appearing.  HENT:     Head: Normocephalic and atraumatic.  Neck:     Musculoskeletal: Neck supple.  Cardiovascular:     Rate and Rhythm: Normal rate and regular rhythm.     Heart sounds: Normal heart sounds. No murmur.  Pulmonary:     Effort: Pulmonary effort is normal. No respiratory distress.     Breath sounds: Normal breath sounds.  Abdominal:     General: There is no distension.     Palpations: Abdomen is soft.     Comments: Diffuse periumbilical and lower abdominal tenderness without focality.  No rebound tenderness or guarding.  Skin:    General: Skin is warm and dry.  Neurological:     Mental Status: She is alert and oriented to person, place, and time.      ED Treatments / Results  Labs (all labs ordered are listed, but only abnormal results are displayed) Labs Reviewed  LIPASE, BLOOD - Abnormal; Notable for the following components:      Result Value   Lipase 117 (*)    All other components within normal limits  COMPREHENSIVE METABOLIC PANEL - Abnormal; Notable for the following components:   BUN 5 (*)    Calcium 8.5 (*)    Total Protein 6.1 (*)    Albumin 2.9 (*)    All other components within normal limits  URINALYSIS, ROUTINE W REFLEX MICROSCOPIC - Abnormal; Notable for the following components:   Hgb urine dipstick SMALL (*)    Bacteria, UA RARE (*)    All  other components within normal limits  C DIFFICILE QUICK SCREEN W PCR REFLEX  GASTROINTESTINAL PANEL BY PCR, STOOL (REPLACES STOOL CULTURE)  CBC  I-STAT BETA HCG BLOOD, ED ([facility], [facility], [facility] ONLY)    EKG None  Radiology Ct Abdomen Pelvis W Contrast  Result Date: 09/19/2018 CLINICAL DATA:  Elevated lipase with abdominal pain and diarrhea. EXAM: CT ABDOMEN AND PELVIS WITH CONTRAST TECHNIQUE: Multidetector CT imaging of the abdomen and pelvis was performed using the standard protocol following bolus administration of intravenous contrast. CONTRAST:  159m OMNIPAQUE IOHEXOL 300 MG/ML  SOLN COMPARISON:  None. FINDINGS: Lower  chest: Clear lung bases. Normal heart size without pericardial or pleural effusion. Hepatobiliary: Normal liver. Normal gallbladder, without biliary ductal dilatation. Pancreas: Normal, without mass or ductal dilatation. Spleen: Normal in size, without focal abnormality. Adrenals/Urinary Tract: Normal adrenal glands. Normal kidneys, without hydronephrosis. Normal urinary bladder. Stomach/Bowel: Normal stomach, without wall thickening. Colonic wall thickening and mild mucosal hyperenhancement are relatively diffuse, with equivocal sparing of the rectum. Normal terminal ileum and appendix. Normal small bowel. Vascular/Lymphatic: Normal caliber of the aorta and branch vessels. Mildly prominent ileocolic mesenteric nodes are likely reactive. Reproductive: Normal uterus and adnexa. Other: Small volume pelvic fluid is favored to be related to the colonic process. Slightly greater than typically seen physiologically. Musculoskeletal: No acute osseous abnormality. IMPRESSION: 1. Relatively diffuse colitis. Differential considerations include infection (exclude C difficile) or inflammation (ulcerative colitis). 2. Small volume pelvic fluid, likely secondary. Electronically Signed   By: Abigail Miyamoto M.D.   On: 09/19/2018 19:18    Procedures Procedures (including critical care time)  Medications  Ordered in ED Medications  ondansetron (ZOFRAN) injection 4 mg (0 mg Intravenous Hold 09/19/18 1846)  iohexol (OMNIPAQUE) 300 MG/ML solution 100 mL (has no administration in time range)  sodium chloride flush (NS) 0.9 % injection 3 mL (3 mLs Intravenous Given 09/19/18 1818)  morphine 4 MG/ML injection 4 mg (4 mg Intravenous Given 09/19/18 1818)  iohexol (OMNIPAQUE) 300 MG/ML solution 100 mL (100 mLs Intravenous Contrast Given 09/19/18 1900)     Initial Impression / Assessment and Plan / ED Course  I have reviewed the triage vital signs and the nursing notes.  Pertinent labs & imaging results that were available during my care of the patient were reviewed by me and considered in my medical decision making (see chart for details).       Monica Ortega is a 28 y.o. female who presents to ED for central lower abdominal pain for the last week associated with loose stools.  Patient has history of blood in the stools, thought to be due to internal hemorrhoids per GI.  This is been an ongoing issue for her for the last 6 months with no acute change.  Her main concern today is abdominal pain and diarrhea which is new.  On exam, she is afebrile, hemodynamically stable with diffuse tenderness across the lower abdomen and periumbilical region.  Labs reviewed and notable for lipase of 117.  CT shows relatively diffuse colitis infectious versus inflammatory.  C. difficile GI panel sent.  Evaluation does not show pathology that would require ongoing emergent intervention or inpatient treatment. Strongly encouraged that she follow-up with her GI doctor at the next available appointment for further work-up of her symptoms.  We discussed symptomatic home care instructions and reasons to return to the emergency department.  All questions were answered.  Patient discussed with Dr. Alvino Chapel who agrees with treatment plan.     Final Clinical Impressions(s) / ED Diagnoses   Final diagnoses:  Diarrhea, unspecified  type  Colitis    ED Discharge Orders         Ordered    HYDROcodone-acetaminophen (NORCO) 5-325 MG tablet  Every 6 hours PRN     09/19/18 2110           Ward, Ozella Almond, PA-C 09/19/18 2133    Davonna Belling, MD 09/19/18 2324

## 2018-09-19 NOTE — ED Triage Notes (Signed)
Pt reports lower abdominal pain X1 week. States she has had chronic rectal bleeding and has seen GI for same, dx with hemorrhoids. Pt states she has had diarrhea since yesterday.

## 2018-09-19 NOTE — Discharge Instructions (Signed)
It was my pleasure taking care of you today!   It is very important that you call your GI doctor tomorrow morning to schedule a follow up appointment.   I have given you a prescription for a few pain pills to take only as needed for severe pain - This can make you very drowsy - please do not drink alcohol, operate heavy machinery or drive on this medication.   Return to ER for new or worsening symptoms, any additional concerns.

## 2018-09-19 NOTE — ED Notes (Signed)
Patient transported to CT 

## 2018-09-20 ENCOUNTER — Emergency Department (HOSPITAL_COMMUNITY)
Admission: EM | Admit: 2018-09-20 | Discharge: 2018-09-20 | Disposition: A | Discharge status: Home / Self Care | Payer: Self-pay | Attending: Emergency Medicine | Admitting: Emergency Medicine

## 2018-09-20 ENCOUNTER — Encounter (HOSPITAL_COMMUNITY): Payer: Self-pay

## 2018-09-20 ENCOUNTER — Other Ambulatory Visit: Payer: Self-pay

## 2018-09-20 DIAGNOSIS — Z7982 Long term (current) use of aspirin: Secondary | ICD-10-CM | POA: Insufficient documentation

## 2018-09-20 DIAGNOSIS — K529 Noninfective gastroenteritis and colitis, unspecified: Secondary | ICD-10-CM | POA: Insufficient documentation

## 2018-09-20 DIAGNOSIS — Z79899 Other long term (current) drug therapy: Secondary | ICD-10-CM | POA: Insufficient documentation

## 2018-09-20 LAB — URINALYSIS, ROUTINE W REFLEX MICROSCOPIC
Bilirubin Urine: NEGATIVE
Glucose, UA: NEGATIVE mg/dL
Ketones, ur: NEGATIVE mg/dL
Leukocytes,Ua: NEGATIVE
Nitrite: NEGATIVE
Protein, ur: NEGATIVE mg/dL
Specific Gravity, Urine: >1.046 — ABNORMAL HIGH (ref 1.005–1.030)
pH: 5 (ref 5.0–8.0)

## 2018-09-20 LAB — COMPREHENSIVE METABOLIC PANEL
ALT: 23 U/L (ref 0–44)
AST: 19 U/L (ref 15–41)
Albumin: 2.8 g/dL — ABNORMAL LOW (ref 3.5–5.0)
Alkaline Phosphatase: 60 U/L (ref 38–126)
Anion gap: 9 (ref 5–15)
BUN: <5 mg/dL — ABNORMAL LOW (ref 6–20)
CO2: 24 mmol/L (ref 22–32)
Calcium: 8 mg/dL — ABNORMAL LOW (ref 8.9–10.3)
Chloride: 103 mmol/L (ref 98–111)
Creatinine, Ser: 0.87 mg/dL (ref 0.44–1.00)
GFR calc Af Amer: >60 mL/min (ref >60)
GFR calc non Af Amer: >60 mL/min (ref >60)
Glucose, Bld: 153 mg/dL — ABNORMAL HIGH (ref 70–99)
Potassium: 3.3 mmol/L — ABNORMAL LOW (ref 3.5–5.1)
Sodium: 136 mmol/L (ref 135–145)
Total Bilirubin: 0.4 mg/dL (ref 0.3–1.2)
Total Protein: 6 g/dL — ABNORMAL LOW (ref 6.5–8.1)

## 2018-09-20 LAB — GASTROINTESTINAL PANEL BY PCR, STOOL (REPLACES STOOL CULTURE)

## 2018-09-20 LAB — C DIFFICILE QUICK SCREEN W PCR REFLEX
C Diff antigen: NEGATIVE
C Diff interpretation: NOT DETECTED
C Diff toxin: NEGATIVE

## 2018-09-20 LAB — I-STAT BETA HCG BLOOD, ED (MC, WL, AP ONLY): I-stat hCG, quantitative: <5 m[IU]/mL (ref <5)

## 2018-09-20 LAB — CBC
HCT: 39.7 % (ref 36.0–46.0)
Hemoglobin: 13.2 g/dL (ref 12.0–15.0)
MCH: 28.5 pg (ref 26.0–34.0)
MCHC: 33.2 g/dL (ref 30.0–36.0)
MCV: 85.7 fL (ref 80.0–100.0)
Platelets: 314 10*3/uL (ref 150–400)
RBC: 4.63 MIL/uL (ref 3.87–5.11)
RDW: 12.7 % (ref 11.5–15.5)
WBC: 8.9 10*3/uL (ref 4.0–10.5)
nRBC: 0 % (ref 0.0–0.2)

## 2018-09-20 LAB — LIPASE, BLOOD: Lipase: 28 U/L (ref 11–51)

## 2018-09-20 MED ORDER — ONDANSETRON HCL 4 MG PO TABS: 4.0000 mg | ORAL_TABLET | Freq: Four times a day (QID) | ORAL | 0 refills | Status: DC

## 2018-09-20 MED ORDER — ONDANSETRON 4 MG PO TBDP
4.0000 mg | ORAL_TABLET | Freq: Once | ORAL | Status: AC | PRN
  Administered 2018-09-20: 4 mg via ORAL
  Filled 2018-09-20: qty 1

## 2018-09-20 MED ORDER — SODIUM CHLORIDE 0.9% FLUSH: 3.0000 mL | Freq: Once | INTRAVENOUS | Status: DC

## 2018-09-20 NOTE — ED Triage Notes (Signed)
Pt arrived with c/o vomiting x once today and continued nausea since released this am. Pt stated that she was told if she vomited or felt bad to return to ER and after eating [employer] and cereal she vomited

## 2018-09-20 NOTE — ED Notes (Signed)
Patient verbalizes understanding of discharge instructions. Opportunity for questioning and answers were provided. Armband removed by staff, pt discharged from home via [place] with family.

## 2018-09-20 NOTE — ED Provider Notes (Signed)
[facility] EMERGENCY DEPARTMENT Provider Note   CSN: [id number] Arrival date & time: 09/20/18  0115    History   Chief Complaint Chief Complaint  Patient presents with  . Nausea  . Abdominal Pain    HPI Monica Ortega is a 28 y.o. female.     HPI   28 year old female presents today with complaints of nausea vomiting diarrhea.  Patient notes symptoms started on Wednesday with abdominal discomfort and diarrhea.  She notes she was seen yesterday had reassuring evaluation with signs of colitis.  Notes she continues to endorse discomfort, she had an episode of vomiting after trying to eat [employer].  She denies any fever.  She does note a small amount of blood in her diarrhea.  Denies any abnormal exposures or close sick contacts  Past Medical History:  Diagnosis Date  . Ankle sprain   . Anxiety   . Depression    denies hospitalization or medication    Patient Active Problem List   Diagnosis Date Noted  . MDD (major depressive disorder), recurrent severe, without psychosis ([place]) 04/16/2015    Past Surgical History:  Procedure Laterality Date  . WISDOM TOOTH EXTRACTION       OB History   No obstetric history on file.      Home Medications    Prior to Admission medications   Medication Sig Start Date End Date Taking? Authorizing Provider  aspirin-acetaminophen-caffeine (EXCEDRIN MIGRAINE) [phone] MG tablet Take 2 tablets by mouth every 6 (six) hours as needed for headache.    [provider]  HYDROcodone-acetaminophen (NORCO) 5-325 MG tablet Take 1 tablet by mouth every 6 (six) hours as needed for moderate pain. 09/19/18   Ward, Ozella Almond, PA-C  hydrocortisone (ANUSOL-HC) 25 MG suppository Place 1 suppository (25 mg total) rectally at bedtime. 06/05/18   Irene Shipper, MD  ibuprofen (ADVIL,MOTRIN) 200 MG tablet Take 400 mg by mouth every 6 (six) hours as needed for moderate pain.    [provider]  levonorgestrel-ethinyl  estradiol (AVIANE,ALESSE,LESSINA) 0.1-20 MG-MCG tablet Take 1 tablet by mouth daily.    [provider]  lidocaine (XYLOCAINE) 2 % solution Use as directed 10 mLs in the mouth or throat as needed for mouth pain. Patient not taking: Reported on 09/19/2018 09/15/18   Wieters, Madelynn Done C, PA-C  omega-3 acid ethyl esters (LOVAZA) 1 G capsule Take 1 g by mouth daily.    [provider]  ondansetron (ZOFRAN) 4 MG tablet Take 1 tablet (4 mg total) by mouth every 6 (six) hours. 09/20/18   Hedges, Dellis Filbert, PA-C  Probiotic Product (PROBIOTIC FORMULA) CAPS Take 1 capsule by mouth daily as needed (for constipation).    [provider]    Family History Family History  Problem Relation Age of Onset  . Diabetes Mother   . Hypertension Father     Social History Social History   Tobacco Use  . Smoking status: Never Smoker  . Smokeless tobacco: Never Used  Substance Use Topics  . Alcohol use: No    Comment: denied alcohol  . Drug use: No    Comment: denied     Allergies   Patient has no known allergies.   Review of Systems Review of Systems  All other systems reviewed and are negative.    Physical Exam Updated Vital Signs BP 126/87 (BP Location: Right Arm)   Pulse 88   Temp 98.9 F (37.2 C) (Oral)   Resp 18   LMP 08/27/2018  SpO2 98%   Physical Exam Vitals signs and nursing note reviewed.  Constitutional:      Appearance: She is well-developed.  HENT:     Head: Normocephalic and atraumatic.  Eyes:     General: No scleral icterus.       Right eye: No discharge.        Left eye: No discharge.     Conjunctiva/sclera: Conjunctivae normal.     Pupils: Pupils are equal, round, and reactive to light.  Neck:     Musculoskeletal: Normal range of motion.     Vascular: No JVD.     Trachea: No tracheal deviation.  Pulmonary:     Effort: Pulmonary effort is normal.     Breath sounds: No stridor.  Abdominal:     Comments: Minimal generalized tenderness to  palpation, nonfocal  Neurological:     Mental Status: She is alert and oriented to person, place, and time.     Coordination: Coordination normal.  Psychiatric:        Behavior: Behavior normal.        Thought Content: Thought content normal.        Judgment: Judgment normal.      ED Treatments / Results  Labs (all labs ordered are listed, but only abnormal results are displayed) Labs Reviewed  COMPREHENSIVE METABOLIC PANEL - Abnormal; Notable for the following components:      Result Value   Potassium 3.3 (*)    Glucose, Bld 153 (*)    BUN <5 (*)    Calcium 8.0 (*)    Total Protein 6.0 (*)    Albumin 2.8 (*)    All other components within normal limits  URINALYSIS, ROUTINE W REFLEX MICROSCOPIC - Abnormal; Notable for the following components:   Color, Urine AMBER (*)    Specific Gravity, Urine >1.046 (*)    Hgb urine dipstick MODERATE (*)    Bacteria, UA RARE (*)    All other components within normal limits  LIPASE, BLOOD  CBC  I-STAT BETA HCG BLOOD, ED ([facility], [facility], [facility] ONLY)    EKG None  Radiology Ct Abdomen Pelvis W Contrast  Result Date: 09/19/2018 CLINICAL DATA:  Elevated lipase with abdominal pain and diarrhea. EXAM: CT ABDOMEN AND PELVIS WITH CONTRAST TECHNIQUE: Multidetector CT imaging of the abdomen and pelvis was performed using the standard protocol following bolus administration of intravenous contrast. CONTRAST:  147m OMNIPAQUE IOHEXOL 300 MG/ML  SOLN COMPARISON:  None. FINDINGS: Lower chest: Clear lung bases. Normal heart size without pericardial or pleural effusion. Hepatobiliary: Normal liver. Normal gallbladder, without biliary ductal dilatation. Pancreas: Normal, without mass or ductal dilatation. Spleen: Normal in size, without focal abnormality. Adrenals/Urinary Tract: Normal adrenal glands. Normal kidneys, without hydronephrosis. Normal urinary bladder. Stomach/Bowel: Normal stomach, without wall thickening. Colonic wall thickening and mild mucosal  hyperenhancement are relatively diffuse, with equivocal sparing of the rectum. Normal terminal ileum and appendix. Normal small bowel. Vascular/Lymphatic: Normal caliber of the aorta and branch vessels. Mildly prominent ileocolic mesenteric nodes are likely reactive. Reproductive: Normal uterus and adnexa. Other: Small volume pelvic fluid is favored to be related to the colonic process. Slightly greater than typically seen physiologically. Musculoskeletal: No acute osseous abnormality. IMPRESSION: 1. Relatively diffuse colitis. Differential considerations include infection (exclude C difficile) or inflammation (ulcerative colitis). 2. Small volume pelvic fluid, likely secondary. Electronically Signed   By: KAbigail MiyamotoM.D.   On: 09/19/2018 19:18    Procedures Procedures (including critical care time)  Medications Ordered in ED  Medications  sodium chloride flush (NS) 0.9 % injection 3 mL (3 mLs Intravenous Not Given 09/20/18 0728)  ondansetron (ZOFRAN-ODT) disintegrating tablet 4 mg (4 mg Oral Given 09/20/18 0134)     Initial Impression / Assessment and Plan / ED Course  I have reviewed the triage vital signs and the nursing notes.  Pertinent labs & imaging results that were available during my care of the patient were reviewed by me and considered in my medical decision making (see chart for details).       28 year old female presents today with nausea vomiting diarrhea.  She has reassuring evaluation here.  She has nonfocal abdominal tenderness, reassuring vital signs, no elevation in white count.  She had a CT yesterday with [facility] consistent with colitis.  She had to have stools culture sent.  Given her continuation of being afebrile with no elevation white count I do find it reasonable continue symptomatic care.  She be given Zofran encouraged use Imodium, follow-up with primary care or gastroenterology.  She is given strict return precautions.  Both her father and her verbalized understanding  and agreement to today's plan.  Patient was taking care of during downtime she was given discharge instructions verbally.  Final Clinical Impressions(s) / ED Diagnoses   Final diagnoses:  Colitis    ED Discharge Orders         Ordered    ondansetron (ZOFRAN) 4 MG tablet  Every 6 hours     09/20/18 0843           Okey Regal, PA-C 09/20/18 1017    Hayden Rasmussen, MD 09/21/18 [phone]

## 2018-09-24 ENCOUNTER — Telehealth: Payer: Self-pay | Admitting: Family Medicine

## 2018-09-24 NOTE — Telephone Encounter (Signed)
Copied from [place] [id number]. Topic: Quick Communication - See Telephone Encounter >> Sep 24, 2018  1:11 PM Blase Mess A wrote: CRM for notification. See Telephone encounter for: 09/24/18.  Patient's sister [name] is calling because the patient was calling to get some understand on lab results 09/20/18 for ED. No one has called Please advise [phone], or [phone] (M)

## 2018-09-24 NOTE — Telephone Encounter (Signed)
I called the pt and recommended she contact her GI doctor as advised per the ER notes.  She stated she has an appt with GI tomorrow and questioned if we had any additional info.

## 2018-09-25 ENCOUNTER — Other Ambulatory Visit (INDEPENDENT_AMBULATORY_CARE_PROVIDER_SITE_OTHER): Payer: Self-pay

## 2018-09-25 ENCOUNTER — Ambulatory Visit (INDEPENDENT_AMBULATORY_CARE_PROVIDER_SITE_OTHER): Payer: Self-pay | Admitting: Physician Assistant

## 2018-09-25 ENCOUNTER — Other Ambulatory Visit: Payer: Self-pay

## 2018-09-25 ENCOUNTER — Telehealth: Payer: Self-pay

## 2018-09-25 ENCOUNTER — Encounter: Payer: Self-pay | Admitting: Physician Assistant

## 2018-09-25 ENCOUNTER — Telehealth: Payer: Self-pay | Admitting: Physician Assistant

## 2018-09-25 VITALS — BP 118/78 | HR 119 | Temp 98.7°F | Ht 64.0 in | Wt 151.0 lb

## 2018-09-25 DIAGNOSIS — R933 Abnormal findings on diagnostic imaging of other parts of digestive tract: Secondary | ICD-10-CM

## 2018-09-25 DIAGNOSIS — R112 Nausea with vomiting, unspecified: Secondary | ICD-10-CM

## 2018-09-25 DIAGNOSIS — K921 Melena: Secondary | ICD-10-CM

## 2018-09-25 DIAGNOSIS — R1084 Generalized abdominal pain: Secondary | ICD-10-CM

## 2018-09-25 DIAGNOSIS — R197 Diarrhea, unspecified: Secondary | ICD-10-CM

## 2018-09-25 LAB — COMPREHENSIVE METABOLIC PANEL
ALT: 15 U/L (ref 0–35)
AST: 12 U/L (ref 0–37)
Albumin: 3.1 g/dL — ABNORMAL LOW (ref 3.5–5.2)
Alkaline Phosphatase: 58 U/L (ref 39–117)
BUN: 9 mg/dL (ref 6–23)
CO2: 25 mEq/L (ref 19–32)
Calcium: 8.3 mg/dL — ABNORMAL LOW (ref 8.4–10.5)
Chloride: 94 mEq/L — ABNORMAL LOW (ref 96–112)
Creatinine, Ser: 0.71 mg/dL (ref 0.40–1.20)
GFR: 96.52 mL/min (ref >60.00)
Glucose, Bld: 118 mg/dL — ABNORMAL HIGH (ref 70–99)
Potassium: 3.5 mEq/L (ref 3.5–5.1)
Sodium: 132 mEq/L — ABNORMAL LOW (ref 135–145)
Total Bilirubin: 0.4 mg/dL (ref 0.2–1.2)
Total Protein: 6.8 g/dL (ref 6.0–8.3)

## 2018-09-25 LAB — CBC WITH DIFFERENTIAL/PLATELET
Basophils Absolute: 0 10*3/uL (ref 0.0–0.1)
Basophils Relative: 0.1 % (ref 0.0–3.0)
Eosinophils Absolute: 0 10*3/uL (ref 0.0–0.7)
Eosinophils Relative: 0.1 % (ref 0.0–5.0)
HCT: 40.8 % (ref 36.0–46.0)
Hemoglobin: 14 g/dL (ref 12.0–15.0)
Lymphocytes Relative: 7.7 % — ABNORMAL LOW (ref 12.0–46.0)
Lymphs Abs: 1 10*3/uL (ref 0.7–4.0)
MCHC: 34.4 g/dL (ref 30.0–36.0)
MCV: 82.4 fl (ref 78.0–100.0)
Monocytes Absolute: 1.9 10*3/uL — ABNORMAL HIGH (ref 0.1–1.0)
Monocytes Relative: 14.4 % — ABNORMAL HIGH (ref 3.0–12.0)
Neutro Abs: 10 10*3/uL — ABNORMAL HIGH (ref 1.4–7.7)
Neutrophils Relative %: 77.7 % — ABNORMAL HIGH (ref 43.0–77.0)
Platelets: 415 10*3/uL — ABNORMAL HIGH (ref 150.0–400.0)
RBC: 4.95 Mil/uL (ref 3.87–5.11)
RDW: 12.7 % (ref 11.5–15.5)
WBC: 12.9 10*3/uL — ABNORMAL HIGH (ref 4.0–10.5)

## 2018-09-25 MED ORDER — CIPROFLOXACIN HCL 500 MG PO TABS: 500.0000 mg | ORAL_TABLET | Freq: Two times a day (BID) | ORAL | 0 refills | Status: DC

## 2018-09-25 MED ORDER — PROMETHAZINE HCL 25 MG PO TABS: 25.0000 mg | ORAL_TABLET | Freq: Four times a day (QID) | ORAL | 2 refills | Status: DC | PRN

## 2018-09-25 MED ORDER — METRONIDAZOLE 500 MG PO TABS: 500.0000 mg | ORAL_TABLET | Freq: Three times a day (TID) | ORAL | 0 refills | Status: DC

## 2018-09-25 NOTE — Telephone Encounter (Signed)
I called patient back to let her know that I have reached out to [facility] and Dr Henrene Pastor.  To check with her pharmacy periodically to see if a prescription is called in.

## 2018-09-25 NOTE — Patient Instructions (Addendum)
If you are age 30 or older, your body mass index should be between 23-30. Your Body mass index is 25.92 kg/m. If this is out of the aforementioned range listed, please consider follow up with your Primary Care Provider.  If you are age 68 or younger, your body mass index should be between 19-25. Your Body mass index is 25.92 kg/m. If this is out of the aformentioned range listed, please consider follow up with your Primary Care Provider.   Your provider has requested that you go to the basement level for lab work before leaving today. Press "B" on the elevator. The lab is located at the first door on the left as you exit the elevator.  We have sent the following medications to your pharmacy for you to pick up at your convenience: Cipro Flagyl Phenergan  Follow up with me on October 02, 2018 at 1:45 pm.  Thank you for choosing me and [facility].    Ellouise Newer, PA-C   To help prevent the possible spread of infection to our patients, communities, and staff; we will be implementing the following measures:  Please only allow one visitor/family member to accompany you to any upcoming appointments with [facility]. If you have any concerns about this please contact our office to discuss prior to the appointment.

## 2018-09-25 NOTE — Telephone Encounter (Signed)
Dr. Henrene Pastor, I spoke with patients sister, [name].  I explained to her that Anderson Malta has already left for the day.  That I wasn't sure that I would get an answer until tomorrow.  This lady was not happy and stated that her sister is in a lot of pain and that this needs to be taken care of now.  I sent Anderson Malta a text and tried to call but I haven't received a response.  Please advise.  Thanks, Peter Congo RMA

## 2018-09-25 NOTE — Telephone Encounter (Signed)
Pt's sister [name] would like to speak with a nurse, she is confused about medications that were prescribed, she stated that pt was prescribed two antibiotics but only one was mentioned during ov, also she wants to know if a pain med was prescribed. Pls call her.

## 2018-09-25 NOTE — Telephone Encounter (Signed)
Monica Ortega pt was seen today

## 2018-09-25 NOTE — Progress Notes (Signed)
Chief Complaint: Abdominal pain, diarrhea, nausea and vomiting, hematochezia  HPI:    Monica Ortega is a 28 year old Caucasian female with a past medical history as listed below, known to Dr. Henrene Pastor, who presents to clinic today for follow-up after being seen in the ER for abdominal pain, diarrhea, nausea and vomiting and hematochezia.    09/19/2018 patient seen in the ED for abdominal pain with bloody diarrhea, nausea and vomiting.  Patient reported blood in her stool off and on for 6 months, but increasing abdominal pain acutely on 09/15/2018.  Initially seen at the urgent care thought it could be viral.  It continued to get worse.  Found to have diffuse periumbilical and lower abdominal tenderness.  Labs with an elevated lipase at 117 and otherwise negative C. difficile and GI path panel.  CT abdomen pelvis with contrast showed relatively diffuse colitis, differential included infection versus inflammation.  Small volume pelvic fluid, likely secondary.  Patient given Zofran and told to follow-up with [place].    09/20/2018 patient return to the ED with increased symptoms of nausea and vomiting as well as continued abdominal discomfort.  At that time potassium decreased to 3.3, albumin low at 2.8, lipase and CBC otherwise normal.  Continued on Zofran and told to follow-up with [place] and use Imodium.    Today, patient presents clinic accompanied by her roommate who does assist with history.  Explains that on 09/19/2018 she had an acute increase in generalized abdominal pain and started with nausea, vomiting and bloody diarrhea at least 5 or 6 times a day.  This has been worsening over the past 2 weeks.  Explains that over the weekend she was running a fever off-and-on up to 103.61F.  Describes feeling dizzy now when she stands up, so she is mostly lying down in her bed.  Is taking Zofran as needed but does not feel as though it helps much and makes her more dizzy.  Overall she feels terrible.  She cannot keep solid food  down over the past 2 days and is now just able to take in liquids which still make her nauseous.  If she does eat anything solid she will vomit it back up.    Denies family history of IBD.    Denies heartburn or reflux.  Past Medical History:  Diagnosis Date  . Ankle sprain   . Anxiety   . Depression    denies hospitalization or medication    Past Surgical History:  Procedure Laterality Date  . WISDOM TOOTH EXTRACTION      Current Outpatient Medications  Medication Sig Dispense Refill  . aspirin-acetaminophen-caffeine (EXCEDRIN MIGRAINE) 250-250-65 MG tablet Take 2 tablets by mouth every 6 (six) hours as needed for headache.    Marland Kitchen HYDROcodone-acetaminophen (NORCO) 5-325 MG tablet Take 1 tablet by mouth every 6 (six) hours as needed for moderate pain. 8 tablet 0  . ibuprofen (ADVIL,MOTRIN) 200 MG tablet Take 400 mg by mouth every 6 (six) hours as needed for moderate pain.    Marland Kitchen ondansetron (ZOFRAN) 4 MG tablet Take 1 tablet (4 mg total) by mouth every 6 (six) hours. 12 tablet 0   No current facility-administered medications for this visit.     Allergies as of 09/25/2018  . (No Known Allergies)    Family History  Problem Relation Age of Onset  . Diabetes Mother   . Hypertension Father     Social History   Socioeconomic History  . Marital status: Significant Other    Spouse  name: Not on file  . Number of children: Not on file  . Years of education: Not on file  . Highest education level: Not on file  Occupational History  . Occupation: Assitant   Social Needs  . Financial resource strain: Not on file  . Food insecurity:    Worry: Not on file    Inability: Not on file  . Transportation needs:    Medical: Not on file    Non-medical: Not on file  Tobacco Use  . Smoking status: Never Smoker  . Smokeless tobacco: Never Used  Substance and Sexual Activity  . Alcohol use: No    Comment: denied alcohol  . Drug use: No    Comment: denied  . Sexual activity: Yes     Birth control/protection: Pill  Lifestyle  . Physical activity:    Days per week: Not on file    Minutes per session: Not on file  . Stress: Not on file  Relationships  . Social connections:    Talks on phone: Not on file    Gets together: Not on file    Attends religious service: Not on file    Active member of club or organization: Not on file    Attends meetings of clubs or organizations: Not on file    Relationship status: Not on file  . Intimate partner violence:    Fear of current or ex partner: Not on file    Emotionally abused: Not on file    Physically abused: Not on file    Forced sexual activity: Not on file  Other Topics Concern  . Not on file  Social History Narrative   Work or Copywriter, advertising      Home Situation: lives with father and sister      Spiritual Beliefs: none, atheist      Lifestyle: 2-4 times per week jogging or elliptical or bike; diet is so so             Review of Systems:    Constitutional: See HPI Cardiovascular: No chest pain Respiratory: No SOB  Gastrointestinal: See HPI and otherwise negative   Physical Exam:  Vital signs: BP 118/78   Pulse (!) 119   Temp 98.7 F (37.1 C)   Ht 5' 4"  (1.626 m)   Wt 151 lb (68.5 kg)   LMP 08/27/2018   SpO2 96%   BMI 25.92 kg/m   Constitutional:   Pleasant ill appearing, pale Caucasian female appears to be in mild distress, Well developed, Well nourished, alert and cooperative Head:  Normocephalic and atraumatic. Eyes:   PEERL, EOMI. No icterus. Conjunctiva pink. Ears:  Normal auditory acuity. Neck:  Supple Throat: Oral cavity and pharynx without inflammation, swelling or lesion.  Respiratory: Respirations even and unlabored. Lungs clear to auscultation bilaterally.   No wheezes, crackles, or rhonchi.  Cardiovascular: Normal S1, S2. No MRG. Regular rate and rhythm. No peripheral edema, cyanosis or pallor.  Gastrointestinal:  Soft, nondistended, Moderate generalized ttp, No rebound or  guarding. Normal bowel sounds. No appreciable masses or hepatomegaly. Rectal:  Not performed.  Msk:  Symmetrical without gross deformities. Without edema, no deformity or joint abnormality.  Neurologic:  Alert and  oriented x4;  grossly normal neurologically.  Skin:   Dry and intact without significant lesions or rashes. Psychiatric: Demonstrates good judgement and reason without abnormal affect or behaviors.  RELEVANT LABS AND IMAGING: CBC    Component Value Date/Time   WBC 8.9 09/20/2018 0136   RBC  4.63 09/20/2018 0136   HGB 13.2 09/20/2018 0136   HCT 39.7 09/20/2018 0136   PLT 314 09/20/2018 0136   MCV 85.7 09/20/2018 0136   MCH 28.5 09/20/2018 0136   MCHC 33.2 09/20/2018 0136   RDW 12.7 09/20/2018 0136   LYMPHSABS 1.3 02/02/2017 1103   MONOABS 0.3 02/02/2017 1103   EOSABS 0.1 02/02/2017 1103   BASOSABS 0.0 02/02/2017 1103    CMP     Component Value Date/Time   NA 136 09/20/2018 0136   K 3.3 (L) 09/20/2018 0136   CL 103 09/20/2018 0136   CO2 24 09/20/2018 0136   GLUCOSE 153 (H) 09/20/2018 0136   BUN <5 (L) 09/20/2018 0136   CREATININE 0.87 09/20/2018 0136   CALCIUM 8.0 (L) 09/20/2018 0136   PROT 6.0 (L) 09/20/2018 0136   ALBUMIN 2.8 (L) 09/20/2018 0136   AST 19 09/20/2018 0136   ALT 23 09/20/2018 0136   ALKPHOS 60 09/20/2018 0136   BILITOT 0.4 09/20/2018 0136   GFRNONAA >60 09/20/2018 0136   GFRAA >60 09/20/2018 0136    Assessment: 1.  Abdominal pain: Generalized, worsening over the past 2 weeks, CT with colitis; Consider infectious versus inflammatory 2.  Nausea and vomiting: With below 3.  Hematochezia: 5-6 diarrheal stools per day with bright red blood over the past 2 weeks, CT with colitis; consider infectious versus inflammatory 4.  Diarrhea 5.  Abnormal CT of the abdomen: Showing diffuse colitis 09/19/2018, symptoms worsening  Plan: 1.  Discussed case with Dr. Henrene Pastor at time of patient's appointment. 2.  Discontinue Zofran.  Start Phenergan 25 mg every 6  hours as needed for nausea #30 with 3 refills 3.  Started Ciprofloxacin 500 mg twice daily x7 days 4.  Start Flagyl 500 mg 3 times daily x7 days 5.  Ordered CBC, CMP today 6.  Patient return to clinic in 1 week.  If at that time she is feeling no better then may need to discuss endoscopic evaluation.  If she is improving then would recommend colonoscopy 4 to 6 weeks after finishing antibiotics. 7.  Did discuss ER precautions with the patient if her symptoms increase/worsen would recommend she proceed to the ER for likely hospital admission given that she is unable to keep down food and symptoms have been worsening over past 2 weeks 8.  Patient will call in between if she needs anything.  Ellouise Newer, PA-C [facility] 09/25/2018, 10:15 AM  Cc: [name], DO

## 2018-09-25 NOTE — Progress Notes (Signed)
Assessment and plans personally reviewed with physician assistant

## 2018-09-26 ENCOUNTER — Other Ambulatory Visit: Payer: Self-pay

## 2018-09-26 ENCOUNTER — Other Ambulatory Visit: Payer: Self-pay | Admitting: Physician Assistant

## 2018-09-26 ENCOUNTER — Telehealth: Payer: Self-pay | Admitting: Physician Assistant

## 2018-09-26 MED ORDER — TRAMADOL HCL 50 MG PO TABS: 50.0000 mg | ORAL_TABLET | Freq: Four times a day (QID) | ORAL | 1 refills | Status: DC | PRN

## 2018-09-26 NOTE — Progress Notes (Signed)
Tramadol sent in for patient as discussed.  Ellouise Newer, PA-C

## 2018-09-26 NOTE — Telephone Encounter (Signed)
09/26/18  0904  Spoke with patient this morning, she has continued in pain this morning. Tells me she is getting her abx in with help of Promethazine. Will get her some Tramadol 14m q 4 hours to have on hand while antibiotics are starting.  JEllouise Newer PA-C

## 2018-09-26 NOTE — Telephone Encounter (Signed)
Bentyl 10 mg; dispense 40; 1-2 every 4-6 hours as needed for pain.  1 refill

## 2018-09-27 NOTE — Telephone Encounter (Signed)
Spoke with patient.  She stated she did not think she needed both.  I asked how she is feeling.  She stated she is feeling a little better.  I told her to call if she decided she wanted the Bentyl.

## 2018-09-27 NOTE — Telephone Encounter (Signed)
Erroneous encounter

## 2018-09-27 NOTE — Telephone Encounter (Signed)
She can, but I doubt she will need both. Feel free to prescribe for her and call and ask how she is doing.  Thanks. JLL

## 2018-09-29 ENCOUNTER — Other Ambulatory Visit: Payer: Self-pay

## 2018-09-29 ENCOUNTER — Inpatient Hospital Stay (HOSPITAL_COMMUNITY)
Admission: EM | Admit: 2018-09-29 | Discharge: 2018-10-03 | DRG: 386 | Disposition: A | Discharge status: Home / Self Care | Payer: Self-pay | Attending: Internal Medicine | Admitting: Internal Medicine

## 2018-09-29 ENCOUNTER — Emergency Department (HOSPITAL_COMMUNITY): Payer: Self-pay

## 2018-09-29 ENCOUNTER — Encounter (HOSPITAL_COMMUNITY): Payer: Self-pay | Admitting: Radiology

## 2018-09-29 DIAGNOSIS — K529 Noninfective gastroenteritis and colitis, unspecified: Secondary | ICD-10-CM

## 2018-09-29 DIAGNOSIS — E871 Hypo-osmolality and hyponatremia: Secondary | ICD-10-CM

## 2018-09-29 DIAGNOSIS — K5100 Ulcerative (chronic) pancolitis without complications: Principal | ICD-10-CM | POA: Diagnosis present

## 2018-09-29 DIAGNOSIS — E876 Hypokalemia: Secondary | ICD-10-CM | POA: Diagnosis present

## 2018-09-29 DIAGNOSIS — K625 Hemorrhage of anus and rectum: Secondary | ICD-10-CM

## 2018-09-29 DIAGNOSIS — E440 Moderate protein-calorie malnutrition: Secondary | ICD-10-CM | POA: Diagnosis present

## 2018-09-29 DIAGNOSIS — Z6825 Body mass index (BMI) 25.0-25.9, adult: Secondary | ICD-10-CM

## 2018-09-29 DIAGNOSIS — R112 Nausea with vomiting, unspecified: Secondary | ICD-10-CM

## 2018-09-29 DIAGNOSIS — Z789 Other specified health status: Secondary | ICD-10-CM

## 2018-09-29 DIAGNOSIS — F419 Anxiety disorder, unspecified: Secondary | ICD-10-CM | POA: Diagnosis present

## 2018-09-29 DIAGNOSIS — Z8249 Family history of ischemic heart disease and other diseases of the circulatory system: Secondary | ICD-10-CM

## 2018-09-29 DIAGNOSIS — R739 Hyperglycemia, unspecified: Secondary | ICD-10-CM | POA: Diagnosis present

## 2018-09-29 DIAGNOSIS — Z833 Family history of diabetes mellitus: Secondary | ICD-10-CM

## 2018-09-29 DIAGNOSIS — Z79899 Other long term (current) drug therapy: Secondary | ICD-10-CM

## 2018-09-29 DIAGNOSIS — E860 Dehydration: Secondary | ICD-10-CM

## 2018-09-29 DIAGNOSIS — F329 Major depressive disorder, single episode, unspecified: Secondary | ICD-10-CM | POA: Diagnosis present

## 2018-09-29 DIAGNOSIS — R109 Unspecified abdominal pain: Secondary | ICD-10-CM

## 2018-09-29 LAB — LIPASE, BLOOD: Lipase: 87 U/L — ABNORMAL HIGH (ref 11–51)

## 2018-09-29 LAB — CBC WITH DIFFERENTIAL/PLATELET
Abs Immature Granulocytes: 1.38 10*3/uL — ABNORMAL HIGH (ref 0.00–0.07)
Basophils Absolute: 0 10*3/uL (ref 0.0–0.1)
Basophils Relative: 0 %
Eosinophils Absolute: 0.1 10*3/uL (ref 0.0–0.5)
Eosinophils Relative: 0 %
HCT: 45.7 % (ref 36.0–46.0)
Hemoglobin: 14.8 g/dL (ref 12.0–15.0)
Immature Granulocytes: 9 %
Lymphocytes Relative: 9 %
Lymphs Abs: 1.3 10*3/uL (ref 0.7–4.0)
MCH: 26.8 pg (ref 26.0–34.0)
MCHC: 32.4 g/dL (ref 30.0–36.0)
MCV: 82.8 fL (ref 80.0–100.0)
Monocytes Absolute: 1.2 10*3/uL — ABNORMAL HIGH (ref 0.1–1.0)
Monocytes Relative: 8 %
Neutro Abs: 11.6 10*3/uL — ABNORMAL HIGH (ref 1.7–7.7)
Neutrophils Relative %: 74 %
Platelets: 501 10*3/uL — ABNORMAL HIGH (ref 150–400)
RBC: 5.52 MIL/uL — ABNORMAL HIGH (ref 3.87–5.11)
RDW: 12.1 % (ref 11.5–15.5)
WBC Morphology: INCREASED
WBC: 15.6 10*3/uL — ABNORMAL HIGH (ref 4.0–10.5)
nRBC: 0 % (ref 0.0–0.2)

## 2018-09-29 LAB — COMPREHENSIVE METABOLIC PANEL
ALT: 16 U/L (ref 0–44)
AST: 15 U/L (ref 15–41)
Albumin: 2.1 g/dL — ABNORMAL LOW (ref 3.5–5.0)
Alkaline Phosphatase: 47 U/L (ref 38–126)
Anion gap: 16 — ABNORMAL HIGH (ref 5–15)
BUN: 13 mg/dL (ref 6–20)
CO2: 23 mmol/L (ref 22–32)
Calcium: 8.1 mg/dL — ABNORMAL LOW (ref 8.9–10.3)
Chloride: 86 mmol/L — ABNORMAL LOW (ref 98–111)
Creatinine, Ser: 0.92 mg/dL (ref 0.44–1.00)
GFR calc Af Amer: >60 mL/min (ref >60)
GFR calc non Af Amer: >60 mL/min (ref >60)
Glucose, Bld: 197 mg/dL — ABNORMAL HIGH (ref 70–99)
Potassium: 3.7 mmol/L (ref 3.5–5.1)
Sodium: 125 mmol/L — ABNORMAL LOW (ref 135–145)
Total Bilirubin: 0.7 mg/dL (ref 0.3–1.2)
Total Protein: 6.3 g/dL — ABNORMAL LOW (ref 6.5–8.1)

## 2018-09-29 LAB — URINALYSIS, ROUTINE W REFLEX MICROSCOPIC
Bilirubin Urine: NEGATIVE
Glucose, UA: NEGATIVE mg/dL
Ketones, ur: 20 mg/dL — AB
Nitrite: NEGATIVE
Protein, ur: NEGATIVE mg/dL
Specific Gravity, Urine: >1.046 — ABNORMAL HIGH (ref 1.005–1.030)
pH: 5 (ref 5.0–8.0)

## 2018-09-29 LAB — I-STAT BETA HCG BLOOD, ED (MC, WL, AP ONLY): I-stat hCG, quantitative: <5 m[IU]/mL (ref <5)

## 2018-09-29 MED ORDER — POTASSIUM CHLORIDE IN NACL 20-0.9 MEQ/L-% IV SOLN
INTRAVENOUS | Status: DC
  Administered 2018-09-29 – 2018-09-30 (×3): via INTRAVENOUS
  Filled 2018-09-29 (×4): qty 1000

## 2018-09-29 MED ORDER — SODIUM CHLORIDE 0.9 % IV BOLUS
1000.0000 mL | Freq: Once | INTRAVENOUS | Status: AC
  Administered 2018-09-29: 1000 mL via INTRAVENOUS

## 2018-09-29 MED ORDER — SODIUM CHLORIDE 0.9% FLUSH
3.0000 mL | Freq: Two times a day (BID) | INTRAVENOUS | Status: DC
  Administered 2018-09-29 – 2018-10-01 (×3): 3 mL via INTRAVENOUS

## 2018-09-29 MED ORDER — ACETAMINOPHEN 325 MG PO TABS
650.0000 mg | ORAL_TABLET | Freq: Four times a day (QID) | ORAL | Status: DC | PRN
  Administered 2018-09-30: 650 mg via ORAL
  Filled 2018-09-29: qty 2

## 2018-09-29 MED ORDER — PIPERACILLIN-TAZOBACTAM 3.375 G IVPB 30 MIN
3.3750 g | Freq: Once | INTRAVENOUS | Status: AC
  Administered 2018-09-29: 3.375 g via INTRAVENOUS
  Filled 2018-09-29: qty 50

## 2018-09-29 MED ORDER — SODIUM CHLORIDE 0.9 % IV SOLN
3.0000 g | Freq: Four times a day (QID) | INTRAVENOUS | Status: DC
  Administered 2018-09-29 – 2018-10-01 (×8): 3 g via INTRAVENOUS
  Filled 2018-09-29 (×9): qty 3

## 2018-09-29 MED ORDER — PROMETHAZINE HCL 25 MG/ML IJ SOLN
25.0000 mg | Freq: Once | INTRAMUSCULAR | Status: AC
  Administered 2018-09-29: 25 mg via INTRAVENOUS
  Filled 2018-09-29: qty 1

## 2018-09-29 MED ORDER — ONDANSETRON HCL 4 MG PO TABS: 4.0000 mg | ORAL_TABLET | Freq: Four times a day (QID) | ORAL | Status: DC | PRN

## 2018-09-29 MED ORDER — PROMETHAZINE HCL 25 MG/ML IJ SOLN
25.0000 mg | Freq: Once | INTRAMUSCULAR | Status: DC
  Filled 2018-09-29: qty 1

## 2018-09-29 MED ORDER — IOHEXOL 300 MG/ML  SOLN
100.0000 mL | Freq: Once | INTRAMUSCULAR | Status: AC | PRN
  Administered 2018-09-29: 100 mL via INTRAVENOUS

## 2018-09-29 MED ORDER — ALUM & MAG HYDROXIDE-SIMETH 200-200-20 MG/5ML PO SUSP
30.0000 mL | Freq: Once | ORAL | Status: AC
  Administered 2018-09-29: 30 mL via ORAL
  Filled 2018-09-29: qty 30

## 2018-09-29 MED ORDER — METHYLPREDNISOLONE SODIUM SUCC 40 MG IJ SOLR
20.0000 mg | Freq: Once | INTRAMUSCULAR | Status: AC
  Administered 2018-09-29: 20 mg via INTRAVENOUS
  Filled 2018-09-29: qty 1

## 2018-09-29 MED ORDER — ACETAMINOPHEN 650 MG RE SUPP: 650.0000 mg | Freq: Four times a day (QID) | RECTAL | Status: DC | PRN

## 2018-09-29 MED ORDER — ONDANSETRON HCL 4 MG/2ML IJ SOLN
4.0000 mg | Freq: Four times a day (QID) | INTRAMUSCULAR | Status: DC | PRN
  Filled 2018-09-29: qty 2

## 2018-09-29 MED ORDER — LIDOCAINE VISCOUS HCL 2 % MT SOLN
15.0000 mL | Freq: Once | OROMUCOSAL | Status: AC
  Administered 2018-09-29: 15 mL via ORAL
  Filled 2018-09-29: qty 15

## 2018-09-29 NOTE — ED Provider Notes (Signed)
[place] EMERGENCY DEPARTMENT Provider Note   CSN: [id number] Arrival date & time: 09/29/18  8127    History   Chief Complaint Chief Complaint  Patient presents with  . Emesis    HPI Monica Ortega is a 28 y.o. female.     Pt presents to the ED today with nausea and vomiting.  She has been sick since 3/7.  She has been to the ED and to GI.  She was diagnosed with colitis and has been on zofran, phenergan, cipro, and flagyl.  She had stool studies which were negative.  She continues to be unable to keep down fluids.     Past Medical History:  Diagnosis Date  . Ankle sprain   . Anxiety   . Depression    denies hospitalization or medication    Patient Active Problem List   Diagnosis Date Noted  . Acute colitis 09/29/2018  . MDD (major depressive disorder), recurrent severe, without psychosis ([place]) 04/16/2015    Past Surgical History:  Procedure Laterality Date  . WISDOM TOOTH EXTRACTION       OB History   No obstetric history on file.      Home Medications    Prior to Admission medications   Medication Sig Start Date End Date Taking? Authorizing Provider  aspirin-acetaminophen-caffeine (EXCEDRIN MIGRAINE) [phone] MG tablet Take 2 tablets by mouth every 6 (six) hours as needed for headache.   Yes [provider]  ciprofloxacin (CIPRO) 500 MG tablet Take 1 tablet (500 mg total) by mouth 2 (two) times daily. Take twice daily for 7 days 09/25/18  Yes Levin Erp, PA  HYDROcodone-acetaminophen (NORCO) 5-325 MG tablet Take 1 tablet by mouth every 6 (six) hours as needed for moderate pain. 09/19/18  Yes Ward, Ozella Almond, PA-C  ibuprofen (ADVIL,MOTRIN) 200 MG tablet Take 400 mg by mouth every 6 (six) hours as needed for moderate pain.   Yes [provider]  loperamide (IMODIUM) 2 MG capsule Take 2 mg by mouth daily as needed for diarrhea or loose stools.   Yes [provider]  metroNIDAZOLE (FLAGYL) 500 MG  tablet Take 1 tablet (500 mg total) by mouth 3 (three) times daily. Take three times daily for 7 days. 09/25/18  Yes Levin Erp, PA  promethazine (PHENERGAN) 25 MG tablet Take 1 tablet (25 mg total) by mouth every 6 (six) hours as needed for nausea or vomiting. 09/25/18  Yes Levin Erp, PA  traMADol (ULTRAM) 50 MG tablet Take 1 tablet (50 mg total) by mouth every 6 (six) hours as needed. 09/26/18 09/26/19 Yes Lemmon, Lavone Nian, PA  ondansetron (ZOFRAN) 4 MG tablet Take 1 tablet (4 mg total) by mouth every 6 (six) hours. Patient not taking: Reported on 09/29/2018 09/20/18   Okey Regal, PA-C    Family History Family History  Problem Relation Age of Onset  . Diabetes Mother   . Hypertension Father     Social History Social History   Tobacco Use  . Smoking status: Never Smoker  . Smokeless tobacco: Never Used  Substance Use Topics  . Alcohol use: No    Comment: denied alcohol  . Drug use: No    Comment: denied     Allergies   Patient has no known allergies.   Review of Systems Review of Systems  Gastrointestinal: Positive for abdominal pain, nausea and vomiting.  All other systems reviewed and are negative.    Physical Exam Updated Vital Signs BP 116/77 (BP Location:  Left Arm)   Pulse (!) 128   Temp 97.8 F (36.6 C) (Oral)   Resp 20   Ht 5' 4"  (1.626 m)   Wt 68 kg   LMP 09/24/2018   SpO2 97%   BMI 25.73 kg/m   Physical Exam Vitals signs and nursing note reviewed.  Constitutional:      Appearance: Normal appearance.  HENT:     Head: Normocephalic and atraumatic.     Right Ear: External ear normal.     Left Ear: External ear normal.     Mouth/Throat:     Mouth: Mucous membranes are dry.  Eyes:     Extraocular Movements: Extraocular movements intact.     Pupils: Pupils are equal, round, and reactive to light.  Neck:     Musculoskeletal: Normal range of motion and neck supple.  Cardiovascular:     Rate and Rhythm: Regular  rhythm. Tachycardia present.     Pulses: Normal pulses.     Heart sounds: Normal heart sounds.  Pulmonary:     Effort: Pulmonary effort is normal.     Breath sounds: Normal breath sounds.  Abdominal:     General: Abdomen is flat. Bowel sounds are normal.     Palpations: Abdomen is soft.     Tenderness: There is generalized abdominal tenderness.  Musculoskeletal: Normal range of motion.  Skin:    General: Skin is warm.     Capillary Refill: Capillary refill takes less than 2 seconds.  Neurological:     General: No focal deficit present.     Mental Status: She is alert and oriented to person, place, and time.  Psychiatric:        Mood and Affect: Mood normal.        Behavior: Behavior normal.      ED Treatments / Results  Labs (all labs ordered are listed, but only abnormal results are displayed) Labs Reviewed  CBC WITH DIFFERENTIAL/PLATELET - Abnormal; Notable for the following components:      Result Value   WBC 15.6 (*)    RBC 5.52 (*)    Platelets 501 (*)    Neutro Abs 11.6 (*)    Monocytes Absolute 1.2 (*)    Abs Immature Granulocytes 1.38 (*)    All other components within normal limits  COMPREHENSIVE METABOLIC PANEL - Abnormal; Notable for the following components:   Sodium 125 (*)    Chloride 86 (*)    Glucose, Bld 197 (*)    Calcium 8.1 (*)    Total Protein 6.3 (*)    Albumin 2.1 (*)    Anion gap 16 (*)    All other components within normal limits  LIPASE, BLOOD - Abnormal; Notable for the following components:   Lipase 87 (*)    All other components within normal limits  URINALYSIS, ROUTINE W REFLEX MICROSCOPIC  I-STAT BETA HCG BLOOD, ED ([facility], [facility], [facility] ONLY)    EKG None  Radiology Ct Abdomen Pelvis W Contrast  Result Date: 09/29/2018 CLINICAL DATA:  Nausea and vomiting several days with lower abdominal pain. EXAM: CT ABDOMEN AND PELVIS WITH CONTRAST TECHNIQUE: Multidetector CT imaging of the abdomen and pelvis was performed using the standard protocol  following bolus administration of intravenous contrast. CONTRAST:  141m OMNIPAQUE IOHEXOL 300 MG/ML  SOLN COMPARISON:  09/19/2018 FINDINGS: Lower chest: Lung bases are clear. Hepatobiliary: Liver, gallbladder and biliary tree are normal. Pancreas: Normal. Spleen: Normal. Adrenals/Urinary Tract: Adrenal glands are normal. Kidneys normal in size without hydronephrosis or  nephrolithiasis. Ureters and bladder are normal. Stomach/Bowel: Stomach and small bowel are within normal. Appendix is normal. Interval worsening of wall thickening throughout the colon with subtle adjacent inflammatory change compatible with pancolitis of infectious or inflammatory nature. Vascular/Lymphatic: Normal. Reproductive: Normal. Other: No significant free fluid. Musculoskeletal: Normal. IMPRESSION: Mild interval worsening of pancolitis likely of infectious or inflammatory nature. Electronically Signed   By: Marin Olp M.D.   On: 09/29/2018 10:31    Procedures Procedures (including critical care time)  Medications Ordered in ED Medications  piperacillin-tazobactam (ZOSYN) IVPB 3.375 g (has no administration in time range)  methylPREDNISolone sodium succinate (SOLU-MEDROL) 40 mg/mL injection 20 mg (has no administration in time range)  sodium chloride 0.9 % bolus 1,000 mL (1,000 mLs Intravenous New Bag/Given 09/29/18 1020)  promethazine (PHENERGAN) injection 25 mg (25 mg Intravenous Given 09/29/18 1021)  iohexol (OMNIPAQUE) 300 MG/ML solution 100 mL (100 mLs Intravenous Contrast Given 09/29/18 0946)  alum & mag hydroxide-simeth (MAALOX/MYLANTA) 200-200-20 MG/5ML suspension 30 mL (30 mLs Oral Given 09/29/18 1044)    And  lidocaine (XYLOCAINE) 2 % viscous mouth solution 15 mL (15 mLs Oral Given 09/29/18 1044)  sodium chloride 0.9 % bolus 1,000 mL (1,000 mLs Intravenous New Bag/Given 09/29/18 1045)     Initial Impression / Assessment and Plan / ED Course  I have reviewed the triage vital signs and the nursing  notes.  Pertinent labs & imaging results that were available during my care of the patient were reviewed by me and considered in my medical decision making (see chart for details).       Pt is not improving despite medications at home.  Colitis looks worse on CT scan.  I will give pt IV zosyn as WBC is up to 15.6 and cipro/flagyl has not yet helped.  She is hyponatremic.  She is given a dose of IV solumedrol as well in case she does have UC.  She is given IVFs and IV phenergan.  Nausea has improved.  Pt d/w Dr. Sarajane Jews ([facility]ad) for admission.  Final Clinical Impressions(s) / ED Diagnoses   Final diagnoses:  Dehydration  Colitis  Non-intractable vomiting with nausea, unspecified vomiting type  Failure of outpatient treatment  Hyponatremia    ED Discharge Orders    None       Isla Pence, MD 09/29/18 1055

## 2018-09-29 NOTE — ED Notes (Signed)
ED TO INPATIENT HANDOFF REPORT  ED Nurse Name and Phone #:  Caryl Comes [phone]  S Name/Age/Gender Monica Ortega 28 y.o. female Room/Bed: 036C/036C  Code Status   Code Status: Prior  Home/SNF/Other Home Patient oriented to: self, place, time and situation Is this baseline? Yes   Triage Complete: Triage complete  Chief Complaint vomiting   Triage Note Pt c/o n/v on and off x4 days; pt states that she hasnt been able to keep anything down ; pt denies any abd pain ; pt states she was told she possible has ulcerative colitis and was given abx and states she has been taking her abx like prescribed    Allergies No Known Allergies  Level of Care/Admitting Diagnosis ED Disposition    ED Disposition Condition [place]: [place] [100100]  Level of Care: Med-Surg [16]  Diagnosis: Acute colitis [937169]  Admitting Physician: [place], [place]  Attending Physician: Samuella Cota [[id number]]  Estimated length of stay: past midnight tomorrow  Certification:: I certify this patient will need inpatient services for at least 2 midnights  PT Class (Do Not Modify): Inpatient [101]  PT Acc Code (Do Not Modify): Private [1]       B Medical/Surgery History Past Medical History:  Diagnosis Date  . Ankle sprain   . Anxiety   . Depression    denies hospitalization or medication   Past Surgical History:  Procedure Laterality Date  . WISDOM TOOTH EXTRACTION       A IV Location/Drains/Wounds Patient Lines/Drains/Airways Status   Active Line/Drains/Airways    Name:   Placement date:   Placement time:   Site:   Days:   Peripheral IV 09/29/18 Right Antecubital   09/29/18    0828    Antecubital   less than 1          Intake/Output Last 24 hours No intake or output data in the 24 hours ending 09/29/18 1130  Labs/Imaging Results for orders placed or performed during the hospital encounter of 09/29/18 (from the past 48 hour(s))  CBC  with Differential     Status: Abnormal   Collection Time: 09/29/18  8:26 AM  Result Value Ref Range   WBC 15.6 (H) 4.0 - 10.5 K/uL   RBC 5.52 (H) 3.87 - 5.11 MIL/uL   Hemoglobin 14.8 12.0 - 15.0 g/dL   HCT 45.7 36.0 - 46.0 %   MCV 82.8 80.0 - 100.0 fL   MCH 26.8 26.0 - 34.0 pg   MCHC 32.4 30.0 - 36.0 g/dL   RDW 12.1 11.5 - 15.5 %   Platelets 501 (H) 150 - 400 K/uL   nRBC 0.0 0.0 - 0.2 %   Neutrophils Relative % 74 %   Neutro Abs 11.6 (H) 1.7 - 7.7 K/uL   Lymphocytes Relative 9 %   Lymphs Abs 1.3 0.7 - 4.0 K/uL   Monocytes Relative 8 %   Monocytes Absolute 1.2 (H) 0.1 - 1.0 K/uL   Eosinophils Relative 0 %   Eosinophils Absolute 0.1 0.0 - 0.5 K/uL   Basophils Relative 0 %   Basophils Absolute 0.0 0.0 - 0.1 K/uL   WBC Morphology INCREASED BANDS (>20% BANDS)     Comment: MODERATE LEFT SHIFT (>5% METAS AND MYELOS,OCC PRO NOTED) TOXIC GRANULATION    Immature Granulocytes 9 %   Abs Immature Granulocytes 1.38 (H) 0.00 - 0.07 K/uL    Comment: Performed at [facility], [address],  [place] [address]  Comprehensive metabolic panel     Status: Abnormal   Collection Time: 09/29/18  8:26 AM  Result Value Ref Range   Sodium 125 (L) 135 - 145 mmol/L   Potassium 3.7 3.5 - 5.1 mmol/L   Chloride 86 (L) 98 - 111 mmol/L   CO2 23 22 - 32 mmol/L   Glucose, Bld 197 (H) 70 - 99 mg/dL   BUN 13 6 - 20 mg/dL   Creatinine, Ser 0.92 0.44 - 1.00 mg/dL   Calcium 8.1 (L) 8.9 - 10.3 mg/dL   Total Protein 6.3 (L) 6.5 - 8.1 g/dL   Albumin 2.1 (L) 3.5 - 5.0 g/dL   AST 15 15 - 41 U/L   ALT 16 0 - 44 U/L   Alkaline Phosphatase 47 38 - 126 U/L   Total Bilirubin 0.7 0.3 - 1.2 mg/dL   GFR calc non Af Amer >60 >60 mL/min   GFR calc Af Amer >60 >60 mL/min   Anion gap 16 (H) 5 - 15    Comment: Performed at [facility], [place] [address] [place], [place] [address]  Lipase, blood     Status: Abnormal   Collection Time: 09/29/18  8:26 AM  Result Value Ref Range   Lipase 87 (H) 11 - 51  U/L    Comment: Performed at [facility] [address] [place], [place] [address]  I-Stat beta hCG blood, ED     Status: None   Collection Time: 09/29/18  8:31 AM  Result Value Ref Range   I-stat hCG, quantitative <5.0 <5 mIU/mL   Comment 3            Comment:   GEST. AGE      CONC.  (mIU/mL)   <=1 WEEK        5 - 50     2 WEEKS       50 - 500     3 WEEKS       100 - 10,000     4 WEEKS     1,000 - 30,000        FEMALE AND NON-PREGNANT FEMALE:     LESS THAN 5 mIU/mL    Ct Abdomen Pelvis W Contrast  Result Date: 09/29/2018 CLINICAL DATA:  Nausea and vomiting several days with lower abdominal pain. EXAM: CT ABDOMEN AND PELVIS WITH CONTRAST TECHNIQUE: Multidetector CT imaging of the abdomen and pelvis was performed using the standard protocol following bolus administration of intravenous contrast. CONTRAST:  145m OMNIPAQUE IOHEXOL 300 MG/ML  SOLN COMPARISON:  09/19/2018 FINDINGS: Lower chest: Lung bases are clear. Hepatobiliary: Liver, gallbladder and biliary tree are normal. Pancreas: Normal. Spleen: Normal. Adrenals/Urinary Tract: Adrenal glands are normal. Kidneys normal in size without hydronephrosis or nephrolithiasis. Ureters and bladder are normal. Stomach/Bowel: Stomach and small bowel are within normal. Appendix is normal. Interval worsening of wall thickening throughout the colon with subtle adjacent inflammatory change compatible with pancolitis of infectious or inflammatory nature. Vascular/Lymphatic: Normal. Reproductive: Normal. Other: No significant free fluid. Musculoskeletal: Normal. IMPRESSION: Mild interval worsening of pancolitis likely of infectious or inflammatory nature. Electronically Signed   By: DMarin OlpM.D.   On: 09/29/2018 10:31    Pending Labs Unresulted Labs (From admission, onward)    Start     Ordered   09/29/18 0826  Urinalysis, Routine w reflex microscopic  (ED Abdominal Pain)  ONCE - STAT,   STAT     09/29/18 02563  Vitals/Pain Today's Vitals   09/29/18 0825 09/29/18 0827 09/29/18 0831  BP:   116/77  Pulse:   (!) 128  Resp:   20  Temp:   97.8 F (36.6 C)  TempSrc:   Oral  SpO2:   97%  Weight: 68 kg    Height: 5' 4"  (1.626 m)    PainSc: 0-No pain 0-No pain     Isolation Precautions No active isolations  Medications Medications  piperacillin-tazobactam (ZOSYN) IVPB 3.375 g (3.375 g Intravenous New Bag/Given 09/29/18 1105)  sodium chloride 0.9 % bolus 1,000 mL (1,000 mLs Intravenous New Bag/Given 09/29/18 1020)  promethazine (PHENERGAN) injection 25 mg (25 mg Intravenous Given 09/29/18 1021)  iohexol (OMNIPAQUE) 300 MG/ML solution 100 mL (100 mLs Intravenous Contrast Given 09/29/18 0946)  alum & mag hydroxide-simeth (MAALOX/MYLANTA) 200-200-20 MG/5ML suspension 30 mL (30 mLs Oral Given 09/29/18 1044)    And  lidocaine (XYLOCAINE) 2 % viscous mouth solution 15 mL (15 mLs Oral Given 09/29/18 1044)  sodium chloride 0.9 % bolus 1,000 mL (1,000 mLs Intravenous New Bag/Given 09/29/18 1045)  methylPREDNISolone sodium succinate (SOLU-MEDROL) 40 mg/mL injection 20 mg (20 mg Intravenous Given 09/29/18 1102)    Mobility walks Low fall risk   Focused Assessments GI   R Recommendations: See Admitting Provider Note  Report given to:   Additional Notes:

## 2018-09-29 NOTE — Progress Notes (Addendum)
Pharmacy Antibiotic Note  Monica Ortega is a 28 y.o. female admitted on 09/29/2018 with intra-abdominal infection.  Pharmacy has been consulted for Unasyn dosing. Patient has a CrCl of 84.67m/min, Scr of 0.92, WBC elevated at 15.6, afebrile and increased HR. Previously on Cipro and Flagyl PTA but couldn't tolerate and keep down.   Plan: Start Unasyn 3g IV every 6 hours with first dose at 1700  Height: 5' 4"  (162.6 cm) Weight: 149 lb 14.6 oz (68 kg) IBW/kg (Calculated) : 54.7  Temp (24hrs), Avg:97.8 F (36.6 C), Min:97.8 F (36.6 C), Max:97.8 F (36.6 C)  Recent Labs  Lab 09/25/18 1047 09/29/18 0826  WBC 12.9* 15.6*  CREATININE 0.71 0.92    Estimated Creatinine Clearance: 84.7 mL/min (by C-G formula based on SCr of 0.92 mg/dL).    No Known Allergies  Antimicrobials this admission: PTA Flagyl 3/17 >> x7 days PTA Cipro 3/17 >> x7 days Zosyn 3/21 x1 Unasyn 3/21 >>  Dose adjustments this admission: N/a  Microbiology results: Pending   Thank you for allowing pharmacy to be a part of this patient's care.  JTamela Gammon PharmD 09/29/2018 2:44 PM PGY-1 Pharmacy Resident Direct Phone: [phone] check [email or web address] for unit-specific pharmacist phone numbers

## 2018-09-29 NOTE — ED Notes (Signed)
Attempted phone call to floor to give report, no reponse

## 2018-09-29 NOTE — ED Triage Notes (Signed)
Pt c/o n/v on and off x4 days; pt states that she hasnt been able to keep anything down ; pt denies any abd pain ; pt states she was told she possible has ulcerative colitis and was given abx and states she has been taking her abx like prescribed

## 2018-09-29 NOTE — H&P (Signed)
History and Physical  Monica Ortega HEN:[id number] DOB: 07-07-1991 DOA: 09/29/2018  PCP: [name], DO   Chief Complaint: abd pain  HPI:  73 yow being treated for colitis as an outpatient with Cipro/Flaygl by [facility] presents with worsening abd pain, inability to keep food down, very poor oral intake. CT showed worse pancolitis. Admitted for further tx and evaluation including IV abx, IVF for dehydration and GI evaluation  Blood in stool since summer, no abd pain until Wednesday 2 weeks ago. Stools are liquid and blood. Associated dizziness when standing, hard to walk. Saw J Lemmon 3/17 and Rx for Cipro/Flagyl and f/u one week. Tried to take but can't keep it down consistently.  COVID SCREEN Fever: about 1 week ago  Cough: NO   SOB: NO URI symptoms: NO GI symptoms: as above Travel: NO Sick contacts: NO Works at [employer]  ED Course: IV abx, IVF, IV steriods   Review of Systems:  Negative for fever, visual changes, sore throat, rash, new muscle aches, chest pain, SOB, dysuria   Past Medical History:  Diagnosis Date  . Ankle sprain   . Anxiety   . Depression    denies hospitalization or medication    Past Surgical History:  Procedure Laterality Date  . WISDOM TOOTH EXTRACTION       reports that she has never smoked. She has never used smokeless tobacco. She reports that she does not drink alcohol or use drugs. Mobility: ambulatory  No Known Allergies  Family History  Problem Relation Age of Onset  . Diabetes Mother   . Hypertension Father      Prior to Admission medications   Medication Sig Start Date End Date Taking? Authorizing Provider  aspirin-acetaminophen-caffeine (EXCEDRIN MIGRAINE) [phone] MG tablet Take 2 tablets by mouth every 6 (six) hours as needed for headache.   Yes [provider]  ciprofloxacin (CIPRO) 500 MG tablet Take 1 tablet (500 mg total) by mouth 2 (two) times daily. Take twice daily for 7 days 09/25/18  Yes Levin Erp,  PA  HYDROcodone-acetaminophen (NORCO) 5-325 MG tablet Take 1 tablet by mouth every 6 (six) hours as needed for moderate pain. 09/19/18  Yes Ward, Ozella Almond, PA-C  ibuprofen (ADVIL,MOTRIN) 200 MG tablet Take 400 mg by mouth every 6 (six) hours as needed for moderate pain.   Yes [provider]  loperamide (IMODIUM) 2 MG capsule Take 2 mg by mouth daily as needed for diarrhea or loose stools.   Yes [provider]  metroNIDAZOLE (FLAGYL) 500 MG tablet Take 1 tablet (500 mg total) by mouth 3 (three) times daily. Take three times daily for 7 days. 09/25/18  Yes Levin Erp, PA  promethazine (PHENERGAN) 25 MG tablet Take 1 tablet (25 mg total) by mouth every 6 (six) hours as needed for nausea or vomiting. 09/25/18  Yes Levin Erp, PA  traMADol (ULTRAM) 50 MG tablet Take 1 tablet (50 mg total) by mouth every 6 (six) hours as needed. 09/26/18 09/26/19 Yes Lemmon, Lavone Nian, PA  ondansetron (ZOFRAN) 4 MG tablet Take 1 tablet (4 mg total) by mouth every 6 (six) hours. Patient not taking: Reported on 09/29/2018 09/20/18   Okey Regal, PA-C    Physical Exam: Vitals:   09/29/18 0831 09/29/18 1245  BP: 116/77 129/88  Pulse: (!) 128 (!) 105  Resp: 20   Temp: 97.8 F (36.6 C) 97.8 F (36.6 C)  SpO2: 97% 100%    Constitutional:   . Appears calm, uncomfortable,  not toxic Eyes:  . pupils and irises appear normal . Normal lids   . Wears glasses ENMT:  . grossly normal hearing  . Lips appear normal Neck:  . neck appears normal, no masses  . no thyromegaly Respiratory:  . CTA bilaterally, no w/r/r.  . Respiratory effort normal.  Cardiovascular:  . RRR, no m/r/g . No LE extremity edema   Abdomen:  . Soft, ntnd Musculoskeletal:  . Digits/nails BUE: no clubbing, cyanosis, petechiae, infection . RUE, LUE o strength and tone normal, no atrophy, no abnormal movements o No tenderness, masses Skin:  . No rashes, lesions, ulcers . palpation of skin:  no induration or nodules Psychiatric:  . Mental status o Mood, affect appropriate . judgment and insight appear intact    I have personally reviewed following labs and imaging studies  Labs:   CMP unremarkable  CBC noted. WBC 15.6     Imaging studies:   CT noted   Review and summation of old records:   As in HPI   Active Problems:   Acute colitis   Assessment/Plan Pancolitis with bloody diarrhea --no systemic symptoms of infection --abd exam benign --suspect IBD --received steroids in ED  --will continue IV abx --GI consult placed for further treatment recommendations  Dehydration with hyponatremia --secondary to very poor oral intake --replete with IVF   COVID likelihood: unlikely Physician PPE: gloves Patient PPE: none    Patient counseling I conducted:  . concepts of social distancing   Severity of Illness: The appropriate patient status for this patient is INPATIENT. Inpatient status is judged to be reasonable and necessary in order to provide the required intensity of service to ensure the patient's safety. The patient's presenting symptoms, physical exam findings, and initial radiographic and laboratory data in the context of their chronic comorbidities is felt to place them at high risk for further clinical deterioration. Furthermore, it is not anticipated that the patient will be medically stable for discharge from the hospital within 2 midnights of admission. The following factors support the patient status of inpatient.   " The patient's presenting symptoms include abd pain, bloody diarrhea. " The worrisome physical exam findings include benign. " The initial radiographic and laboratory data are worrisome because of worsening pancolitis. " The chronic co-morbidities include none.  Patient unable to take sufficient nutrition, fluids and oral abx. Needs inpt treatment. Don't expect this to improve in <3 days.   * I certify that at the point of  admission it is my clinical judgment that the patient will require inpatient hospital care spanning beyond 2 midnights from the point of admission due to high intensity of service, high risk for further deterioration and high frequency of surveillance required.*     DVT prophylaxis:SCDs Code Status: Full Family Communication: sister at bedside (works in Art therapist here) Consults called: [facility]    Time spent: 79 minutes  Murray Hodgkins, MD  [facility] Direct contact: see [email or web address]  7PM-7AM contact night coverage as below   1. Check the care team in [facility] and look for a) attending/consulting [facility] provider listed and b) the [facility] team listed 2. Log into [email or web address] and use [facility]'s universal password to access. If you do not have the password, please contact the hospital operator. 3. Locate the [facility] provider you are looking for under [facility] and page to a number that you can be directly reached. 4. If you still have difficulty reaching the provider, please page the [facility] (Director on Call) for  the Hospitalists listed on amion for assistance.   09/29/2018, 1:39 PM

## 2018-09-29 NOTE — ED Notes (Signed)
Patient transported to CT 

## 2018-09-30 DIAGNOSIS — R109 Unspecified abdominal pain: Secondary | ICD-10-CM

## 2018-09-30 LAB — COMPREHENSIVE METABOLIC PANEL
ALT: 10 U/L (ref 0–44)
AST: 11 U/L — ABNORMAL LOW (ref 15–41)
Albumin: 1.7 g/dL — ABNORMAL LOW (ref 3.5–5.0)
Alkaline Phosphatase: 35 U/L — ABNORMAL LOW (ref 38–126)
Anion gap: 7 (ref 5–15)
BUN: <5 mg/dL — ABNORMAL LOW (ref 6–20)
CO2: 25 mmol/L (ref 22–32)
Calcium: 7.3 mg/dL — ABNORMAL LOW (ref 8.9–10.3)
Chloride: 103 mmol/L (ref 98–111)
Creatinine, Ser: 0.57 mg/dL (ref 0.44–1.00)
GFR calc Af Amer: >60 mL/min (ref >60)
GFR calc non Af Amer: >60 mL/min (ref >60)
Glucose, Bld: 139 mg/dL — ABNORMAL HIGH (ref 70–99)
Potassium: 3.7 mmol/L (ref 3.5–5.1)
Sodium: 135 mmol/L (ref 135–145)
Total Bilirubin: 0.5 mg/dL (ref 0.3–1.2)
Total Protein: 4.6 g/dL — ABNORMAL LOW (ref 6.5–8.1)

## 2018-09-30 LAB — CBC
HCT: 33.7 % — ABNORMAL LOW (ref 36.0–46.0)
Hemoglobin: 11.3 g/dL — ABNORMAL LOW (ref 12.0–15.0)
MCH: 28.1 pg (ref 26.0–34.0)
MCHC: 33.5 g/dL (ref 30.0–36.0)
MCV: 83.8 fL (ref 80.0–100.0)
Platelets: 345 10*3/uL (ref 150–400)
RBC: 4.02 MIL/uL (ref 3.87–5.11)
RDW: 12.2 % (ref 11.5–15.5)
WBC: 10.1 10*3/uL (ref 4.0–10.5)
nRBC: 0 % (ref 0.0–0.2)

## 2018-09-30 MED ORDER — POLYETHYLENE GLYCOL 3350 17 GM/SCOOP PO POWD
1.0000 | Freq: Once | ORAL | Status: AC
  Administered 2018-09-30: 255 g via ORAL
  Filled 2018-09-30: qty 255

## 2018-09-30 MED ORDER — METOCLOPRAMIDE HCL 5 MG/ML IJ SOLN
10.0000 mg | Freq: Once | INTRAMUSCULAR | Status: AC
  Administered 2018-09-30: 10 mg via INTRAVENOUS
  Filled 2018-09-30: qty 2

## 2018-09-30 MED ORDER — OXYCODONE HCL 5 MG PO TABS
5.0000 mg | ORAL_TABLET | ORAL | Status: DC | PRN
  Administered 2018-09-30 – 2018-10-03 (×4): 5 mg via ORAL
  Filled 2018-09-30 (×4): qty 1

## 2018-09-30 MED ORDER — TRAMADOL HCL 50 MG PO TABS
50.0000 mg | ORAL_TABLET | Freq: Three times a day (TID) | ORAL | Status: DC | PRN
  Administered 2018-09-30: 50 mg via ORAL
  Filled 2018-09-30: qty 1

## 2018-09-30 MED ORDER — SODIUM CHLORIDE 0.9 % IV SOLN
INTRAVENOUS | Status: AC
  Administered 2018-09-30 – 2018-10-01 (×3): via INTRAVENOUS

## 2018-09-30 NOTE — Consult Note (Addendum)
[facility] Consult: 1:19 PM 09/30/2018  LOS: 1 day    Referring Provider: Dr Reesa Chew  Primary Care Physician:  [name], DO Primary Gastroenterologist: Scarlette Shorts, MD Patient's sister is [facility] [phone].     Reason for Consultation: Colitis, bloody diarrhea.   HPI: Monica Ortega is a 28 y.o. female.  Previously healthy and on no chronic meds other than oral contraceptive.  She works as a Technical brewer. Evaluated for minor to moderate rectal bleeding in 05/2019.  Unremarkable rectal exam.  No anemia.  No abdominal pain.  2.  Added Anusol suppositories and daily Metamucil.  Return to office if no improvement Seen by GI PA Lemmon in the office 09/25/2018 for follow-up from visit to urgent care and ED 09/15/2018, 09/19/2018, 09/20/2018.  With abdominal pain, bloody diarrhea, nausea and vomiting.  She has been having intermittent blood in her stool for 6 months.  Increasing abdominal pain since 09/15/2018.  On 311 her lipase was 117 but the next day it was 28.  LFTs normal.  WBCs normal but up to 12.9 on 09/25/2018, the day of her office visit.  09/19/2018 C. Difficile, GI path panel negative.  09/19/2018 CT abdomen pelvis with contrast showed diffuse colitis, infection versus inflammatory.  Small pelvic fluid likely secondary to the colitis.  Patient initially treated with Zofran, hydrocodone and Ultram were added later, these do help the pain.  09/25/2018 office visit she was still having generalized abdominal pain, bloody diarrhea up to 5 or 6 times a day nausea and vomiting.  She reported a fever to 103.3 the weekend prior to the visit.  She felt dizzy when standing.  Not using a lot of Zofran because it was making her more dizzy.  She just felt terrible.  Unable to keep solid food down for 2 days,  tolerating liquids but these cause nausea.  Are discussing the case with Dr. Henrene Pastor the plan was to treat this as infectious colitis with 7 days of Cipro 500 BID, Flagyl 500 TID.  No improvement then moved to sooner/more urgent colonoscopy.  She did improve then pursue colonoscopy in 4 to 6 weeks Because of her difficulties with nausea and vomiting, she stopped the antibiotics after 3 days. The nausea is constant at this point.  Diffuse abdominal pain persists.  5-7 liquid, bloody stools daily.  When she vomits it is clear, not bloody or dark.  Because she felt so awful and the symptoms were getting no better she came to the ED yesterday and was admitted. No fever.  Blood pressures in the 1 teens-20s/70s-80s.  Initial rate 128, has come down into the 80s today. Hgb baseline is about 13-14.  It was 14.8 yesterday, 11.3 today.  MCV 82. UBC's have gone from 15.6 to 10.1 in the last 24 hours. Hyponatremic at 125 yesterday, 135 this morning. Lipase 87, normal LFTs BUN/creatinine are okay.  Serum glucose as high as 197 yesterday. U/A unremarkable other than increase specific gravity 09/29/2018 CT abdomen pelvis with contrast: Interval, mild worsening of pancolitis of infectious versus  inflammatory etiology.    No family history of IBD.      Past Medical History:  Diagnosis Date  . Ankle sprain   . Anxiety   . Depression    denies hospitalization or medication    Past Surgical History:  Procedure Laterality Date  . WISDOM TOOTH EXTRACTION      Prior to Admission medications   Medication Sig Start Date End Date Taking? Authorizing Provider  aspirin-acetaminophen-caffeine (EXCEDRIN MIGRAINE) [phone] MG tablet Take 2 tablets by mouth every 6 (six) hours as needed for headache.   Yes [provider]  ciprofloxacin (CIPRO) 500 MG tablet Take 1 tablet (500 mg total) by mouth 2 (two) times daily. Take twice daily for 7 days 09/25/18  Yes Levin Erp, PA   HYDROcodone-acetaminophen (NORCO) 5-325 MG tablet Take 1 tablet by mouth every 6 (six) hours as needed for moderate pain. 09/19/18  Yes Ward, Ozella Almond, PA-C  ibuprofen (ADVIL,MOTRIN) 200 MG tablet Take 400 mg by mouth every 6 (six) hours as needed for moderate pain.   Yes [provider]  loperamide (IMODIUM) 2 MG capsule Take 2 mg by mouth daily as needed for diarrhea or loose stools.   Yes [provider]  metroNIDAZOLE (FLAGYL) 500 MG tablet Take 1 tablet (500 mg total) by mouth 3 (three) times daily. Take three times daily for 7 days. 09/25/18  Yes Levin Erp, PA  promethazine (PHENERGAN) 25 MG tablet Take 1 tablet (25 mg total) by mouth every 6 (six) hours as needed for nausea or vomiting. 09/25/18  Yes Levin Erp, PA  traMADol (ULTRAM) 50 MG tablet Take 1 tablet (50 mg total) by mouth every 6 (six) hours as needed. 09/26/18 09/26/19 Yes Lemmon, Lavone Nian, PA  ondansetron (ZOFRAN) 4 MG tablet Take 1 tablet (4 mg total) by mouth every 6 (six) hours. Patient not taking: Reported on 09/29/2018 09/20/18   Okey Regal, PA-C    Scheduled Meds: . sodium chloride flush  3 mL Intravenous Q12H   Infusions: . sodium chloride 75 mL/hr at 09/30/18 1220  . ampicillin-sulbactam (UNASYN) IV Stopped (09/30/18 1157)   PRN Meds: acetaminophen **OR** acetaminophen, ondansetron **OR** ondansetron (ZOFRAN) IV   Allergies as of 09/29/2018  . (No Known Allergies)    Family History  Problem Relation Age of Onset  . Diabetes Mother   . Hypertension Father     Social History   Socioeconomic History  . Marital status: Significant Other    Spouse name: Not on file  . Number of children: Not on file  . Years of education: Not on file  . Highest education level: Not on file  Occupational History  . Occupation: Assitant   Social Needs  . Financial resource strain: Not on file  . Food insecurity:    Worry: Not on file    Inability: Not on file  .  Transportation needs:    Medical: Not on file    Non-medical: Not on file  Tobacco Use  . Smoking status: Never Smoker  . Smokeless tobacco: Never Used  Substance and Sexual Activity  . Alcohol use: No    Comment: denied alcohol  . Drug use: No    Comment: denied  . Sexual activity: Yes    Birth control/protection: Pill  Lifestyle  . Physical activity:    Days per week: Not on file    Minutes per session: Not on file  . Stress: Not on file  Relationships  . Social connections:  Talks on phone: Not on file    Gets together: Not on file    Attends religious service: Not on file    Active member of club or organization: Not on file    Attends meetings of clubs or organizations: Not on file    Relationship status: Not on file  . Intimate partner violence:    Fear of current or ex partner: Not on file    Emotionally abused: Not on file    Physically abused: Not on file    Forced sexual activity: Not on file  Other Topics Concern  . Not on file  Social History Narrative   Work or Copywriter, advertising      Home Situation: lives with father and sister      Spiritual Beliefs: none, atheist      Lifestyle: 2-4 times per week jogging or elliptical or bike; diet is so so             REVIEW OF SYSTEMS: Constitutional: Fatigue. ENT:  No nose bleeds Pulm: No shortness of breath or cough.  No pleuritic pain. CV:  No palpitations, no LE edema.  No chest pain. GU:  No hematuria, no frequency.  No oliguria. GI: See HPI.  No history of heartburn. Heme: Other than the bloody stool has not had any unusual or excessive bleeding or bruising. Transfusions: None Neuro:  No headaches, no peripheral tingling or numbness.  No syncope.  No seizures. Derm:  No itching, no rash or sores.  Endocrine:  No sweats or chills.  No polyuria or dysuria Immunization: She received her flu shot in October 2019 and Tdap in 10/2014 Travel:  None beyond local counties in last few months.     PHYSICAL EXAM: Vital signs in last 24 hours: Vitals:   09/29/18 2120 09/30/18 0420  BP: 119/81 117/74  Pulse: 96 89  Resp: 18 16  Temp: 99.7 F (37.6 C) 98.3 F (36.8 C)  SpO2: 97% 99%   Wt Readings from Last 3 Encounters:  09/29/18 68 kg  09/25/18 68.5 kg  09/19/18 70.3 kg    General: Pale, pleasant, mildly ill-appearing but comfortable WF who is a good historian. Head: No facial asymmetry or swelling.  No signs of head trauma. Eyes: Scleral icterus.  No conjunctival pallor. Ears: Not hard of hearing Nose: No discharge or congestion. Mouth: Oropharynx moist, clear, pink.  Tongue midline.  Good dentition. Neck: No JVD, no masses, no thyromegaly. Lungs: Clear bilaterally.  No labored breathing or cough. Heart: RRR.  No MRG.  S1, S2 present. Abdomen: Soft.  Not distended.  Tender diffusely, bilaterally across mid and upper abdomen.  No guarding or rebound.  No HSM, masses, bruits, hernias..   Rectal: Deferred. Musc/Skeltl: No joint redness, swelling or gross deformity. Extremities: No CCE. Neurologic: Oriented x3.  Fully alert.  Good historian.  Moves all 4 limbs without weakness or tremor. Skin: No rash, no sores, no suspicious lesions. Nodes: No cervical adenopathy. Psych: Pleasant, cooperative, calm, fluid speech.  Intake/Output from previous day: 03/21 0701 - 03/22 0700 In: 2521.9 [P.O.:960; I.V.:1461.9; IV Piggyback:100] Out: 1 [Urine:1] Intake/Output this shift: Total I/O In: 1830.9 [P.O.:480; I.V.:1050.9; IV Piggyback:300] Out: -   LAB RESULTS: Recent Labs    09/29/18 0826 09/30/18 0440  WBC 15.6* 10.1  HGB 14.8 11.3*  HCT 45.7 33.7*  PLT 501* 345   BMET Lab Results  Component Value Date   NA 135 09/30/2018   NA 125 (L) 09/29/2018   NA 132 (  L) 09/25/2018   K 3.7 09/30/2018   K 3.7 09/29/2018   K 3.5 09/25/2018   CL 103 09/30/2018   CL 86 (L) 09/29/2018   CL 94 (L) 09/25/2018   CO2 25 09/30/2018   CO2 23 09/29/2018   CO2 25 09/25/2018    GLUCOSE 139 (H) 09/30/2018   GLUCOSE 197 (H) 09/29/2018   GLUCOSE 118 (H) 09/25/2018   BUN <5 (L) 09/30/2018   BUN 13 09/29/2018   BUN 9 09/25/2018   CREATININE 0.57 09/30/2018   CREATININE 0.92 09/29/2018   CREATININE 0.71 09/25/2018   CALCIUM 7.3 (L) 09/30/2018   CALCIUM 8.1 (L) 09/29/2018   CALCIUM 8.3 (L) 09/25/2018   LFT Recent Labs    09/29/18 0826 09/30/18 0440  PROT 6.3* 4.6*  ALBUMIN 2.1* 1.7*  AST 15 11*  ALT 16 10  ALKPHOS 47 35*  BILITOT 0.7 0.5   PT/INR No results found for: INR, PROTIME Hepatitis Panel No results for input(s): HEPBSAG, HCVAB, HEPAIGM, HEPBIGM in the last 72 hours. C-Diff No components found for: CDIFF Lipase     Component Value Date/Time   LIPASE 87 (H) 09/29/2018 0826    Drugs of Abuse     Component Value Date/Time   LABOPIA NONE DETECTED 04/16/2015 1800   COCAINSCRNUR NONE DETECTED 04/16/2015 1800   LABBENZ NONE DETECTED 04/16/2015 1800   AMPHETMU NONE DETECTED 04/16/2015 1800   THCU NONE DETECTED 04/16/2015 1800   LABBARB NONE DETECTED 04/16/2015 1800     RADIOLOGY STUDIES: Ct Abdomen Pelvis W Contrast  Result Date: 09/29/2018 CLINICAL DATA:  Nausea and vomiting several days with lower abdominal pain. EXAM: CT ABDOMEN AND PELVIS WITH CONTRAST TECHNIQUE: Multidetector CT imaging of the abdomen and pelvis was performed using the standard protocol following bolus administration of intravenous contrast. CONTRAST:  184m OMNIPAQUE IOHEXOL 300 MG/ML  SOLN COMPARISON:  09/19/2018 FINDINGS: Lower chest: Lung bases are clear. Hepatobiliary: Liver, gallbladder and biliary tree are normal. Pancreas: Normal. Spleen: Normal. Adrenals/Urinary Tract: Adrenal glands are normal. Kidneys normal in size without hydronephrosis or nephrolithiasis. Ureters and bladder are normal. Stomach/Bowel: Stomach and small bowel are within normal. Appendix is normal. Interval worsening of wall thickening throughout the colon with subtle adjacent inflammatory change  compatible with pancolitis of infectious or inflammatory nature. Vascular/Lymphatic: Normal. Reproductive: Normal. Other: No significant free fluid. Musculoskeletal: Normal. IMPRESSION: Mild interval worsening of pancolitis likely of infectious or inflammatory nature. Electronically Signed   By: DMarin OlpM.D.   On: 09/29/2018 10:31    IMPRESSION:   *    Pancolitis.  Symptoms of abdominal pain, nausea/vomiting, bloody diarrhea.  C. difficile and stool pathogen panel earlier this month all negative.  Started on oral Cipro/Flagyl in case this were infectious colitis on 3/17 but was not able to tolerate, keep down the meds so she has not had those since 3/20. Day 2 empiric Zosyn.  Leukocytosis resolved.  *   Hyponatremia, resolved.  *    Mild elevation of lipase of unclear significance.  Several left LFT's over the last 3 weeks consistently normal.  *     Hyperglycemia.  No history of DM or glucose intolerance  PLAN:     *    Plan colonoscopy tomorrow.  Pt understands and ready to proceed.    *    Hopefully she will be able to complete the bowel prep, I talked to her about possible need for NG tube to successfully complete the prep.  She is okay with this but  is going to try to take it orally. As it is more easily tolerated, I ordered the split dose MiraLAX/Gatorade prep with IV Reglan at start of prep  *    Upped IV fluids to 150/hour.  *    Ms. Kesinger and her sister [name] would like it if the GI doctor could contact [place] after the colonoscopy tomorrow.  Starting tomorrow no visitors will be allowed in the hospital and [name] would like to know what the doctor found. Samantha's phone number is [phone]   Azucena Freed  09/30/2018, 1:19 PM Phone [phone]   Attending physician's note   I have taken an interval history, reviewed the chart and examined the patient. I agree with the Advanced Practitioner's note, impression and recommendations.   28 year old with  N/V/bloody diarrhea/abdominal pain underwent CT showing pancolitis.  Negative stool studies.  Unable to tolerate empiric Cipro/Flagyl.  Could have IBD.  Elevated WBC count 15K and platelets Plan: Colonoscopy in a.m. discussed risks and benefits.  Further recommendations thereafter.   Dr Loletha Carrow would likely be doing colonoscopy in a.m.  Carmell Austria, MD

## 2018-09-30 NOTE — H&P (View-Only) (Signed)
[facility] Consult: 1:19 PM 09/30/2018  LOS: 1 day    Referring Provider: Dr Reesa Chew  Primary Care Physician:  [name], DO Primary Gastroenterologist: Scarlette Shorts, MD Patient's sister is [facility] [phone].     Reason for Consultation: Colitis, bloody diarrhea.   HPI: Monica Ortega is a 28 y.o. female.  Previously healthy and on no chronic meds other than oral contraceptive.  She works as a Technical brewer. Evaluated for minor to moderate rectal bleeding in 05/2019.  Unremarkable rectal exam.  No anemia.  No abdominal pain.  2.  Added Anusol suppositories and daily Metamucil.  Return to office if no improvement Seen by GI PA Lemmon in the office 09/25/2018 for follow-up from visit to urgent care and ED 09/15/2018, 09/19/2018, 09/20/2018.  With abdominal pain, bloody diarrhea, nausea and vomiting.  She has been having intermittent blood in her stool for 6 months.  Increasing abdominal pain since 09/15/2018.  On 311 her lipase was 117 but the next day it was 28.  LFTs normal.  WBCs normal but up to 12.9 on 09/25/2018, the day of her office visit.  09/19/2018 C. Difficile, GI path panel negative.  09/19/2018 CT abdomen pelvis with contrast showed diffuse colitis, infection versus inflammatory.  Small pelvic fluid likely secondary to the colitis.  Patient initially treated with Zofran, hydrocodone and Ultram were added later, these do help the pain.  09/25/2018 office visit she was still having generalized abdominal pain, bloody diarrhea up to 5 or 6 times a day nausea and vomiting.  She reported a fever to 103.3 the weekend prior to the visit.  She felt dizzy when standing.  Not using a lot of Zofran because it was making her more dizzy.  She just felt terrible.  Unable to keep solid food down for 2 days,  tolerating liquids but these cause nausea.  Are discussing the case with Dr. Henrene Pastor the plan was to treat this as infectious colitis with 7 days of Cipro 500 BID, Flagyl 500 TID.  No improvement then moved to sooner/more urgent colonoscopy.  She did improve then pursue colonoscopy in 4 to 6 weeks Because of her difficulties with nausea and vomiting, she stopped the antibiotics after 3 days. The nausea is constant at this point.  Diffuse abdominal pain persists.  5-7 liquid, bloody stools daily.  When she vomits it is clear, not bloody or dark.  Because she felt so awful and the symptoms were getting no better she came to the ED yesterday and was admitted. No fever.  Blood pressures in the 1 teens-20s/70s-80s.  Initial rate 128, has come down into the 80s today. Hgb baseline is about 13-14.  It was 14.8 yesterday, 11.3 today.  MCV 82. UBC's have gone from 15.6 to 10.1 in the last 24 hours. Hyponatremic at 125 yesterday, 135 this morning. Lipase 87, normal LFTs BUN/creatinine are okay.  Serum glucose as high as 197 yesterday. U/A unremarkable other than increase specific gravity 09/29/2018 CT abdomen pelvis with contrast: Interval, mild worsening of pancolitis of infectious versus  inflammatory etiology.    No family history of IBD.      Past Medical History:  Diagnosis Date  . Ankle sprain   . Anxiety   . Depression    denies hospitalization or medication    Past Surgical History:  Procedure Laterality Date  . WISDOM TOOTH EXTRACTION      Prior to Admission medications   Medication Sig Start Date End Date Taking? Authorizing Provider  aspirin-acetaminophen-caffeine (EXCEDRIN MIGRAINE) [phone] MG tablet Take 2 tablets by mouth every 6 (six) hours as needed for headache.   Yes [provider]  ciprofloxacin (CIPRO) 500 MG tablet Take 1 tablet (500 mg total) by mouth 2 (two) times daily. Take twice daily for 7 days 09/25/18  Yes Levin Erp, PA   HYDROcodone-acetaminophen (NORCO) 5-325 MG tablet Take 1 tablet by mouth every 6 (six) hours as needed for moderate pain. 09/19/18  Yes Ward, Ozella Almond, PA-C  ibuprofen (ADVIL,MOTRIN) 200 MG tablet Take 400 mg by mouth every 6 (six) hours as needed for moderate pain.   Yes [provider]  loperamide (IMODIUM) 2 MG capsule Take 2 mg by mouth daily as needed for diarrhea or loose stools.   Yes [provider]  metroNIDAZOLE (FLAGYL) 500 MG tablet Take 1 tablet (500 mg total) by mouth 3 (three) times daily. Take three times daily for 7 days. 09/25/18  Yes Levin Erp, PA  promethazine (PHENERGAN) 25 MG tablet Take 1 tablet (25 mg total) by mouth every 6 (six) hours as needed for nausea or vomiting. 09/25/18  Yes Levin Erp, PA  traMADol (ULTRAM) 50 MG tablet Take 1 tablet (50 mg total) by mouth every 6 (six) hours as needed. 09/26/18 09/26/19 Yes Lemmon, Lavone Nian, PA  ondansetron (ZOFRAN) 4 MG tablet Take 1 tablet (4 mg total) by mouth every 6 (six) hours. Patient not taking: Reported on 09/29/2018 09/20/18   Okey Regal, PA-C    Scheduled Meds: . sodium chloride flush  3 mL Intravenous Q12H   Infusions: . sodium chloride 75 mL/hr at 09/30/18 1220  . ampicillin-sulbactam (UNASYN) IV Stopped (09/30/18 1157)   PRN Meds: acetaminophen **OR** acetaminophen, ondansetron **OR** ondansetron (ZOFRAN) IV   Allergies as of 09/29/2018  . (No Known Allergies)    Family History  Problem Relation Age of Onset  . Diabetes Mother   . Hypertension Father     Social History   Socioeconomic History  . Marital status: Significant Other    Spouse name: Not on file  . Number of children: Not on file  . Years of education: Not on file  . Highest education level: Not on file  Occupational History  . Occupation: Assitant   Social Needs  . Financial resource strain: Not on file  . Food insecurity:    Worry: Not on file    Inability: Not on file  .  Transportation needs:    Medical: Not on file    Non-medical: Not on file  Tobacco Use  . Smoking status: Never Smoker  . Smokeless tobacco: Never Used  Substance and Sexual Activity  . Alcohol use: No    Comment: denied alcohol  . Drug use: No    Comment: denied  . Sexual activity: Yes    Birth control/protection: Pill  Lifestyle  . Physical activity:    Days per week: Not on file    Minutes per session: Not on file  . Stress: Not on file  Relationships  . Social connections:  Talks on phone: Not on file    Gets together: Not on file    Attends religious service: Not on file    Active member of club or organization: Not on file    Attends meetings of clubs or organizations: Not on file    Relationship status: Not on file  . Intimate partner violence:    Fear of current or ex partner: Not on file    Emotionally abused: Not on file    Physically abused: Not on file    Forced sexual activity: Not on file  Other Topics Concern  . Not on file  Social History Narrative   Work or Copywriter, advertising      Home Situation: lives with father and sister      Spiritual Beliefs: none, atheist      Lifestyle: 2-4 times per week jogging or elliptical or bike; diet is so so             REVIEW OF SYSTEMS: Constitutional: Fatigue. ENT:  No nose bleeds Pulm: No shortness of breath or cough.  No pleuritic pain. CV:  No palpitations, no LE edema.  No chest pain. GU:  No hematuria, no frequency.  No oliguria. GI: See HPI.  No history of heartburn. Heme: Other than the bloody stool has not had any unusual or excessive bleeding or bruising. Transfusions: None Neuro:  No headaches, no peripheral tingling or numbness.  No syncope.  No seizures. Derm:  No itching, no rash or sores.  Endocrine:  No sweats or chills.  No polyuria or dysuria Immunization: She received her flu shot in October 2019 and Tdap in 10/2014 Travel:  None beyond local counties in last few months.     PHYSICAL EXAM: Vital signs in last 24 hours: Vitals:   09/29/18 2120 09/30/18 0420  BP: 119/81 117/74  Pulse: 96 89  Resp: 18 16  Temp: 99.7 F (37.6 C) 98.3 F (36.8 C)  SpO2: 97% 99%   Wt Readings from Last 3 Encounters:  09/29/18 68 kg  09/25/18 68.5 kg  09/19/18 70.3 kg    General: Pale, pleasant, mildly ill-appearing but comfortable WF who is a good historian. Head: No facial asymmetry or swelling.  No signs of head trauma. Eyes: Scleral icterus.  No conjunctival pallor. Ears: Not hard of hearing Nose: No discharge or congestion. Mouth: Oropharynx moist, clear, pink.  Tongue midline.  Good dentition. Neck: No JVD, no masses, no thyromegaly. Lungs: Clear bilaterally.  No labored breathing or cough. Heart: RRR.  No MRG.  S1, S2 present. Abdomen: Soft.  Not distended.  Tender diffusely, bilaterally across mid and upper abdomen.  No guarding or rebound.  No HSM, masses, bruits, hernias..   Rectal: Deferred. Musc/Skeltl: No joint redness, swelling or gross deformity. Extremities: No CCE. Neurologic: Oriented x3.  Fully alert.  Good historian.  Moves all 4 limbs without weakness or tremor. Skin: No rash, no sores, no suspicious lesions. Nodes: No cervical adenopathy. Psych: Pleasant, cooperative, calm, fluid speech.  Intake/Output from previous day: 03/21 0701 - 03/22 0700 In: 2521.9 [P.O.:960; I.V.:1461.9; IV Piggyback:100] Out: 1 [Urine:1] Intake/Output this shift: Total I/O In: 1830.9 [P.O.:480; I.V.:1050.9; IV Piggyback:300] Out: -   LAB RESULTS: Recent Labs    09/29/18 0826 09/30/18 0440  WBC 15.6* 10.1  HGB 14.8 11.3*  HCT 45.7 33.7*  PLT 501* 345   BMET Lab Results  Component Value Date   NA 135 09/30/2018   NA 125 (L) 09/29/2018   NA 132 (  L) 09/25/2018   K 3.7 09/30/2018   K 3.7 09/29/2018   K 3.5 09/25/2018   CL 103 09/30/2018   CL 86 (L) 09/29/2018   CL 94 (L) 09/25/2018   CO2 25 09/30/2018   CO2 23 09/29/2018   CO2 25 09/25/2018    GLUCOSE 139 (H) 09/30/2018   GLUCOSE 197 (H) 09/29/2018   GLUCOSE 118 (H) 09/25/2018   BUN <5 (L) 09/30/2018   BUN 13 09/29/2018   BUN 9 09/25/2018   CREATININE 0.57 09/30/2018   CREATININE 0.92 09/29/2018   CREATININE 0.71 09/25/2018   CALCIUM 7.3 (L) 09/30/2018   CALCIUM 8.1 (L) 09/29/2018   CALCIUM 8.3 (L) 09/25/2018   LFT Recent Labs    09/29/18 0826 09/30/18 0440  PROT 6.3* 4.6*  ALBUMIN 2.1* 1.7*  AST 15 11*  ALT 16 10  ALKPHOS 47 35*  BILITOT 0.7 0.5   PT/INR No results found for: INR, PROTIME Hepatitis Panel No results for input(s): HEPBSAG, HCVAB, HEPAIGM, HEPBIGM in the last 72 hours. C-Diff No components found for: CDIFF Lipase     Component Value Date/Time   LIPASE 87 (H) 09/29/2018 0826    Drugs of Abuse     Component Value Date/Time   LABOPIA NONE DETECTED 04/16/2015 1800   COCAINSCRNUR NONE DETECTED 04/16/2015 1800   LABBENZ NONE DETECTED 04/16/2015 1800   AMPHETMU NONE DETECTED 04/16/2015 1800   THCU NONE DETECTED 04/16/2015 1800   LABBARB NONE DETECTED 04/16/2015 1800     RADIOLOGY STUDIES: Ct Abdomen Pelvis W Contrast  Result Date: 09/29/2018 CLINICAL DATA:  Nausea and vomiting several days with lower abdominal pain. EXAM: CT ABDOMEN AND PELVIS WITH CONTRAST TECHNIQUE: Multidetector CT imaging of the abdomen and pelvis was performed using the standard protocol following bolus administration of intravenous contrast. CONTRAST:  148m OMNIPAQUE IOHEXOL 300 MG/ML  SOLN COMPARISON:  09/19/2018 FINDINGS: Lower chest: Lung bases are clear. Hepatobiliary: Liver, gallbladder and biliary tree are normal. Pancreas: Normal. Spleen: Normal. Adrenals/Urinary Tract: Adrenal glands are normal. Kidneys normal in size without hydronephrosis or nephrolithiasis. Ureters and bladder are normal. Stomach/Bowel: Stomach and small bowel are within normal. Appendix is normal. Interval worsening of wall thickening throughout the colon with subtle adjacent inflammatory change  compatible with pancolitis of infectious or inflammatory nature. Vascular/Lymphatic: Normal. Reproductive: Normal. Other: No significant free fluid. Musculoskeletal: Normal. IMPRESSION: Mild interval worsening of pancolitis likely of infectious or inflammatory nature. Electronically Signed   By: DMarin OlpM.D.   On: 09/29/2018 10:31    IMPRESSION:   *    Pancolitis.  Symptoms of abdominal pain, nausea/vomiting, bloody diarrhea.  C. difficile and stool pathogen panel earlier this month all negative.  Started on oral Cipro/Flagyl in case this were infectious colitis on 3/17 but was not able to tolerate, keep down the meds so she has not had those since 3/20. Day 2 empiric Zosyn.  Leukocytosis resolved.  *   Hyponatremia, resolved.  *    Mild elevation of lipase of unclear significance.  Several left LFT's over the last 3 weeks consistently normal.  *     Hyperglycemia.  No history of DM or glucose intolerance  PLAN:     *    Plan colonoscopy tomorrow.  Pt understands and ready to proceed.    *    Hopefully she will be able to complete the bowel prep, I talked to her about possible need for NG tube to successfully complete the prep.  She is okay with this but  is going to try to take it orally. As it is more easily tolerated, I ordered the split dose MiraLAX/Gatorade prep with IV Reglan at start of prep  *    Upped IV fluids to 150/hour.  *    Ms. Shores and her sister [name] would like it if the GI doctor could contact [facility] after the colonoscopy tomorrow.  Starting tomorrow no visitors will be allowed in the hospital and [name] would like to know what the doctor found. Samantha's phone number is [phone]   Azucena Freed  09/30/2018, 1:19 PM Phone [phone]   Attending physician's note   I have taken an interval history, reviewed the chart and examined the patient. I agree with the Advanced Practitioner's note, impression and recommendations.   28 year old with  N/V/bloody diarrhea/abdominal pain underwent CT showing pancolitis.  Negative stool studies.  Unable to tolerate empiric Cipro/Flagyl.  Could have IBD.  Elevated WBC count 15K and platelets Plan: Colonoscopy in a.m. discussed risks and benefits.  Further recommendations thereafter.   Dr Loletha Carrow would likely be doing colonoscopy in a.m.  Carmell Austria, MD

## 2018-09-30 NOTE — Progress Notes (Signed)
PROGRESS NOTE    Monica Ortega  HXT:[id number] DOB: 1990/09/08 DOA: 09/29/2018 PCP: [name], DO   Brief Narrative:  28 year old with no known past medical history came to the hospital with complaints of abdominal pain.  Patient states she has been having abdominal pain since September 12, 2018 and was initially diagnosed with colitis and also saw GI outpatient.  She was placed on Cipro and Flagyl but due to worsening of the symptoms came back to the hospital.  CT of the abdomen showed worsening of pancolitis and admitted to the hospital.   Assessment & Plan:   Principal Problem:   Acute colitis Active Problems:   Abdominal pain  Abdominal pain with nausea, worsening of colitis -Failed outpatient treatment with Cipro and Flagyl.  CT of the abdomen pelvis shows worsening of colitis.  Question of infectious versus inflammatory.  For now continue antibiotics, diet as tolerated.  GI consulted.  Patient will require endoscopic evaluation eventually, deferred to GI. -Antiemetics, pain control, IV fluids.  Hyponatremia, resolved -With IV fluids sodium is improved to 135 with fluids.   DVT prophylaxis: SCDs Code Status: Full code Family Communication: Sister at bedside Disposition Plan: Maintain inpatient stay  Consultants:   Gastroenterology  Procedures:   None  Antimicrobials:   Unasyn day 2   Subjective: Patient tells me she has been of abdominal pain for the past 3 weeks which has progressively worsened.  Was diagnosed with possible colitis placed on Cipro and Flagyl.  Continue to worsen therefore came to the hospital.  Denies any personal or family history of colon cancer.  Denies any previous history of inflammatory bowel disease.  Denies tobacco use. She denies any recent travel or contact with sick person.  Review of Systems Otherwise negative except as per HPI, including: General: Denies fever, chills, night sweats or unintended weight loss. Resp: Denies cough,  wheezing, shortness of breath. Cardiac: Denies chest pain, palpitations, orthopnea, paroxysmal nocturnal dyspnea. GI: Denies  diarrhea or constipation GU: Denies dysuria, frequency, hesitancy or incontinence MS: Denies muscle aches, joint pain or swelling Neuro: Denies headache, neurologic deficits (focal weakness, numbness, tingling), abnormal gait Psych: Denies anxiety, depression, SI/HI/AVH Skin: Denies new rashes or lesions ID: Denies sick contacts, exotic exposures, travel  Objective: Vitals:   09/29/18 1100 09/29/18 1245 09/29/18 2120 09/30/18 0420  BP:  129/88 119/81 117/74  Pulse:  (!) 105 96 89  Resp:   18 16  Temp:  97.8 F (36.6 C) 99.7 F (37.6 C) 98.3 F (36.8 C)  TempSrc:  Oral Oral Oral  SpO2:  100% 97% 99%  Weight: 68 kg     Height: 5' 4"  (1.626 m)       Intake/Output Summary (Last 24 hours) at 09/30/2018 1125 Last data filed at 09/30/2018 1022 Gross per 24 hour  Intake 3740 ml  Output 1 ml  Net 3739 ml   Filed Weights   09/29/18 0825 09/29/18 1100  Weight: 68 kg 68 kg    Examination:  Constitutional: NAD, calm, comfortable Eyes: PERRL, lids and conjunctivae normal ENMT: Mucous membranes are moist. Posterior pharynx clear of any exudate or lesions.Normal dentition.  Neck: normal, supple, no masses, no thyromegaly Respiratory: clear to auscultation bilaterally, no wheezing, no crackles. Normal respiratory effort. No accessory muscle use.  Cardiovascular: Regular rate and rhythm, no murmurs / rubs / gallops. No extremity edema. 2+ pedal pulses. No carotid bruits.  Abdomen: no tenderness, no masses palpated. No hepatosplenomegaly. Bowel sounds positive.  Musculoskeletal: no clubbing / cyanosis.  No joint deformity upper and lower extremities. Good ROM, no contractures. Normal muscle tone.  Skin: no rashes, lesions, ulcers. No induration Neurologic: CN 2-12 grossly intact. Sensation intact, DTR normal. Strength 5/5 in all 4.  Psychiatric: Normal judgment and  insight. Alert and oriented x 3. Normal mood.     Data Reviewed:   CBC: Recent Labs  Lab 09/25/18 1047 09/29/18 0826 09/30/18 0440  WBC 12.9* 15.6* 10.1  NEUTROABS 10.0* 11.6*  --   HGB 14.0 14.8 11.3*  HCT 40.8 45.7 33.7*  MCV 82.4 82.8 83.8  PLT 415.0* 501* 664   Basic Metabolic Panel: Recent Labs  Lab 09/25/18 1047 09/29/18 0826 09/30/18 0440  NA 132* 125* 135  K 3.5 3.7 3.7  CL 94* 86* 103  CO2 25 23 25   GLUCOSE 118* 197* 139*  BUN 9 13 <5*  CREATININE 0.71 0.92 0.57  CALCIUM 8.3* 8.1* 7.3*   GFR: Estimated Creatinine Clearance: 97.4 mL/min (by C-G formula based on SCr of 0.57 mg/dL). Liver Function Tests: Recent Labs  Lab 09/25/18 1047 09/29/18 0826 09/30/18 0440  AST 12 15 11*  ALT 15 16 10   ALKPHOS 58 47 35*  BILITOT 0.4 0.7 0.5  PROT 6.8 6.3* 4.6*  ALBUMIN 3.1* 2.1* 1.7*   Recent Labs  Lab 09/29/18 0826  LIPASE 87*   No results for input(s): AMMONIA in the last 168 hours. Coagulation Profile: No results for input(s): INR, PROTIME in the last 168 hours. Cardiac Enzymes: No results for input(s): CKTOTAL, CKMB, CKMBINDEX, TROPONINI in the last 168 hours. BNP (last 3 results) No results for input(s): PROBNP in the last 8760 hours. HbA1C: No results for input(s): HGBA1C in the last 72 hours. CBG: No results for input(s): GLUCAP in the last 168 hours. Lipid Profile: No results for input(s): CHOL, HDL, LDLCALC, TRIG, CHOLHDL, LDLDIRECT in the last 72 hours. Thyroid Function Tests: No results for input(s): TSH, T4TOTAL, FREET4, T3FREE, THYROIDAB in the last 72 hours. Anemia Panel: No results for input(s): VITAMINB12, FOLATE, FERRITIN, TIBC, IRON, RETICCTPCT in the last 72 hours. Sepsis Labs: No results for input(s): PROCALCITON, LATICACIDVEN in the last 168 hours.  No results found for this or any previous visit (from the past 240 hour(s)).       Radiology Studies: Ct Abdomen Pelvis W Contrast  Result Date: 09/29/2018 CLINICAL DATA:   Nausea and vomiting several days with lower abdominal pain. EXAM: CT ABDOMEN AND PELVIS WITH CONTRAST TECHNIQUE: Multidetector CT imaging of the abdomen and pelvis was performed using the standard protocol following bolus administration of intravenous contrast. CONTRAST:  126m OMNIPAQUE IOHEXOL 300 MG/ML  SOLN COMPARISON:  09/19/2018 FINDINGS: Lower chest: Lung bases are clear. Hepatobiliary: Liver, gallbladder and biliary tree are normal. Pancreas: Normal. Spleen: Normal. Adrenals/Urinary Tract: Adrenal glands are normal. Kidneys normal in size without hydronephrosis or nephrolithiasis. Ureters and bladder are normal. Stomach/Bowel: Stomach and small bowel are within normal. Appendix is normal. Interval worsening of wall thickening throughout the colon with subtle adjacent inflammatory change compatible with pancolitis of infectious or inflammatory nature. Vascular/Lymphatic: Normal. Reproductive: Normal. Other: No significant free fluid. Musculoskeletal: Normal. IMPRESSION: Mild interval worsening of pancolitis likely of infectious or inflammatory nature. Electronically Signed   By: DMarin OlpM.D.   On: 09/29/2018 10:31        Scheduled Meds: . sodium chloride flush  3 mL Intravenous Q12H   Continuous Infusions: . 0.9 % NaCl with KCl 20 mEq / L 150 mL/hr at 09/30/18 0837  . ampicillin-sulbactam (UNASYN)  IV 3 g (09/30/18 0454)     LOS: 1 day   Time spent= 35 mins    Ankit Arsenio Loader, MD [facility]  If 7PM-7AM, please contact night-coverage [email or web address] 09/30/2018, 11:25 AM

## 2018-09-30 NOTE — Anesthesia Preprocedure Evaluation (Addendum)
Anesthesia Evaluation  Patient identified by MRN, date of birth, ID band Patient awake    Reviewed: Allergy & Precautions, NPO status , Patient's Chart, lab work & pertinent test results  History of Anesthesia Complications Negative for: history of anesthetic complications  Airway Mallampati: II  TM Distance: >3 FB Neck ROM: Full    Dental  (+) Dental Advisory Given, Teeth Intact   Pulmonary neg pulmonary ROS,    breath sounds clear to auscultation       Cardiovascular negative cardio ROS   Rhythm:Regular Rate:Normal     Neuro/Psych Anxiety Depression negative neurological ROS     GI/Hepatic Neg liver ROS, Colitis with Bloody diarrhea   Endo/Other  negative endocrine ROS  Renal/GU negative Renal ROS     Musculoskeletal   Abdominal   Peds  Hematology Hb 10.4   Anesthesia Other Findings   Reproductive/Obstetrics                            Anesthesia Physical Anesthesia Plan  ASA: II  Anesthesia Plan: MAC   Post-op Pain Management:    Induction:   PONV Risk Score and Plan: 2 and Ondansetron, Treatment may vary due to age or medical condition and Dexamethasone  Airway Management Planned: Natural Airway and Simple Face Mask  Additional Equipment:   Intra-op Plan:   Post-operative Plan:   Informed Consent: I have reviewed the patients History and Physical, chart, labs and discussed the procedure including the risks, benefits and alternatives for the proposed anesthesia with the patient or authorized representative who has indicated his/her understanding and acceptance.     Dental advisory given  Plan Discussed with: CRNA and Surgeon  Anesthesia Plan Comments:        Anesthesia Quick Evaluation

## 2018-10-01 ENCOUNTER — Inpatient Hospital Stay (HOSPITAL_COMMUNITY): Payer: Self-pay | Admitting: Anesthesiology

## 2018-10-01 ENCOUNTER — Encounter (HOSPITAL_COMMUNITY)
Admission: EM | Disposition: A | Discharge status: Home / Self Care | Payer: Self-pay | Source: Home / Self Care | Attending: Internal Medicine

## 2018-10-01 ENCOUNTER — Encounter (HOSPITAL_COMMUNITY): Payer: Self-pay | Admitting: Anesthesiology

## 2018-10-01 DIAGNOSIS — K921 Melena: Secondary | ICD-10-CM

## 2018-10-01 DIAGNOSIS — R1084 Generalized abdominal pain: Secondary | ICD-10-CM

## 2018-10-01 DIAGNOSIS — K5100 Ulcerative (chronic) pancolitis without complications: Principal | ICD-10-CM

## 2018-10-01 DIAGNOSIS — K529 Noninfective gastroenteritis and colitis, unspecified: Secondary | ICD-10-CM

## 2018-10-01 HISTORY — PX: BIOPSY: SHX5522

## 2018-10-01 HISTORY — PX: COLONOSCOPY WITH PROPOFOL: SHX5780

## 2018-10-01 LAB — CBC
HCT: 33.8 % — ABNORMAL LOW (ref 36.0–46.0)
Hemoglobin: 10.4 g/dL — ABNORMAL LOW (ref 12.0–15.0)
MCH: 26.5 pg (ref 26.0–34.0)
MCHC: 30.8 g/dL (ref 30.0–36.0)
MCV: 86.2 fL (ref 80.0–100.0)
Platelets: 315 10*3/uL (ref 150–400)
RBC: 3.92 MIL/uL (ref 3.87–5.11)
RDW: 12.6 % (ref 11.5–15.5)
WBC: 10.6 10*3/uL — ABNORMAL HIGH (ref 4.0–10.5)
nRBC: 0 % (ref 0.0–0.2)

## 2018-10-01 LAB — COMPREHENSIVE METABOLIC PANEL
ALT: 11 U/L (ref 0–44)
AST: 18 U/L (ref 15–41)
Albumin: 1.6 g/dL — ABNORMAL LOW (ref 3.5–5.0)
Alkaline Phosphatase: 32 U/L — ABNORMAL LOW (ref 38–126)
Anion gap: 6 (ref 5–15)
BUN: <5 mg/dL — ABNORMAL LOW (ref 6–20)
CO2: 25 mmol/L (ref 22–32)
Calcium: 6.9 mg/dL — ABNORMAL LOW (ref 8.9–10.3)
Chloride: 99 mmol/L (ref 98–111)
Creatinine, Ser: 0.74 mg/dL (ref 0.44–1.00)
GFR calc Af Amer: >60 mL/min (ref >60)
GFR calc non Af Amer: >60 mL/min (ref >60)
Glucose, Bld: 103 mg/dL — ABNORMAL HIGH (ref 70–99)
Potassium: 3.2 mmol/L — ABNORMAL LOW (ref 3.5–5.1)
Sodium: 130 mmol/L — ABNORMAL LOW (ref 135–145)
Total Bilirubin: 0.4 mg/dL (ref 0.3–1.2)
Total Protein: 4.3 g/dL — ABNORMAL LOW (ref 6.5–8.1)

## 2018-10-01 LAB — MAGNESIUM: Magnesium: 1.8 mg/dL (ref 1.7–2.4)

## 2018-10-01 SURGERY — BIOPSY
Anesthesia: Monitor Anesthesia Care

## 2018-10-01 MED ORDER — METHYLPREDNISOLONE SODIUM SUCC 125 MG IJ SOLR
40.0000 mg | Freq: Three times a day (TID) | INTRAMUSCULAR | Status: DC
  Administered 2018-10-01 – 2018-10-03 (×7): 40 mg via INTRAVENOUS
  Filled 2018-10-01 (×7): qty 2

## 2018-10-01 MED ORDER — POTASSIUM CHLORIDE 10 MEQ/100ML IV SOLN
10.0000 meq | INTRAVENOUS | Status: AC
  Administered 2018-10-01: 10 meq via INTRAVENOUS
  Filled 2018-10-01 (×7): qty 100

## 2018-10-01 MED ORDER — POTASSIUM CHLORIDE 20 MEQ PO PACK
40.0000 meq | PACK | Freq: Once | ORAL | Status: AC
  Administered 2018-10-01: 40 meq via ORAL
  Filled 2018-10-01: qty 2

## 2018-10-01 MED ORDER — MAGNESIUM SULFATE 2 GM/50ML IV SOLN
2.0000 g | Freq: Once | INTRAVENOUS | Status: AC
  Administered 2018-10-01: 2 g via INTRAVENOUS
  Filled 2018-10-01 (×2): qty 50

## 2018-10-01 MED ORDER — LIDOCAINE HCL (CARDIAC) PF 100 MG/5ML IV SOSY
PREFILLED_SYRINGE | INTRAVENOUS | Status: DC | PRN
  Administered 2018-10-01: 60 mg via INTRAVENOUS

## 2018-10-01 MED ORDER — PROPOFOL 500 MG/50ML IV EMUL
INTRAVENOUS | Status: DC | PRN
  Administered 2018-10-01: 150 ug/kg/min via INTRAVENOUS

## 2018-10-01 MED ORDER — LACTATED RINGERS IV SOLN
INTRAVENOUS | Status: DC | PRN
  Administered 2018-10-01: 08:00:00 via INTRAVENOUS

## 2018-10-01 SURGICAL SUPPLY — 22 items

## 2018-10-01 NOTE — Anesthesia Postprocedure Evaluation (Signed)
Anesthesia Post Note  Patient: Monica Ortega  Procedure(s) Performed: FLEXIBLE SIGMOIDOSCOPY (N/A ) BIOPSY     Patient location during evaluation: Endoscopy Anesthesia Type: MAC Level of consciousness: awake and alert, oriented and patient cooperative Pain management: pain level controlled Vital Signs Assessment: post-procedure vital signs reviewed and stable Respiratory status: spontaneous breathing, nonlabored ventilation and respiratory function stable Cardiovascular status: blood pressure returned to baseline Postop Assessment: no apparent nausea or vomiting Anesthetic complications: no    Last Vitals:  Vitals:   10/01/18 0828 10/01/18 0840  BP: 116/76 116/73  Pulse: 100 (!) 107  Resp: (!) 24 (!) 23  Temp: 37.2 C   SpO2: 99% 99%    Last Pain:  Vitals:   10/01/18 0840  TempSrc:   PainSc: 0-No pain                 JACKSON,E. CARSWELL

## 2018-10-01 NOTE — Transfer of Care (Signed)
Immediate Anesthesia Transfer of Care Note  Patient: Monica Ortega  Procedure(s) Performed: FLEXIBLE SIGMOIDOSCOPY (N/A ) BIOPSY  Patient Location: Endoscopy Unit  Anesthesia Type:MAC  Level of Consciousness: drowsy  Airway & Oxygen Therapy: Patient Spontanous Breathing and Patient connected to face mask oxygen  Post-op Assessment: Report given to RN and Post -op Vital signs reviewed and stable  Post vital signs: Reviewed and stable  Last Vitals:  Vitals Value Taken Time  BP    Temp    Pulse 100 10/01/2018  8:28 AM  Resp 24 10/01/2018  8:28 AM  SpO2 99 % 10/01/2018  8:28 AM  Vitals shown include unvalidated device data.  Last Pain:  Vitals:   10/01/18 0734  TempSrc: Oral  PainSc: 0-No pain      Patients Stated Pain Goal: 2 (83/72/90 2111)  Complications: No apparent anesthesia complications

## 2018-10-01 NOTE — Op Note (Signed)
[facility] Patient Name: Monica Ortega Procedure Date : 10/01/2018 MRN: [id number] Attending MD: Estill Cotta. Loletha Carrow , MD Date of Birth: August 27, 1990 CSN: [id number] Age: 28 Admit Type: Inpatient Procedure:                Colonoscopy Indications:              Generalized abdominal pain, Chronic diarrhea,                            Hematochezia, Abnormal CT of the GI tract showing                            pancolitis (negative stool infectious studies,                            symptoms recently escalating) Providers:                Estill Cotta. Loletha Carrow, MD, Burtis Junes, RN Referring MD:             [facility] Medicines:                Monitored Anesthesia Care Complications:            No immediate complications. Estimated Blood Loss:     Estimated blood loss was minimal. Procedure:                Pre-Anesthesia Assessment:                           - Prior to the procedure, a History and Physical                            was performed, and patient medications and                            allergies were reviewed. The patient's tolerance of                            previous anesthesia was also reviewed. The risks                            and benefits of the procedure and the sedation                            options and risks were discussed with the patient.                            All questions were answered, and informed consent                            was obtained. Prior Anticoagulants: The patient has                            taken no previous anticoagulant or antiplatelet  agents. ASA Grade Assessment: II - A patient with                            mild systemic disease. After reviewing the risks                            and benefits, the patient was deemed in                            satisfactory condition to undergo the procedure.                           After obtaining informed consent, the colonoscope            was passed under direct vision. Throughout the                            procedure, the patient's blood pressure, pulse, and                            oxygen saturations were monitored continuously. The                            CF-HQ190L ([id number]) Olympus colonoscope was                            introduced through the anus and advanced to the the                            descending colon for evaluation. This was the                            intended extent. The colonoscopy was performed                            without difficulty. The patient tolerated the                            procedure well. The quality of the bowel                            preparation was good. The rectum and the left colon                            were photographed. Scope In: 8:17:31 AM Scope Out: 8:24:49 AM Total Procedure Duration: 0 hours 7 minutes 18 seconds  Findings:      The perianal and digital rectal examinations were normal. Specifically,       no fistula or other peri-anal disease was seen.      Inflammation characterized by adherent blood, congestion (edema),       friability, pseudopolyps and deep ulcerations was found in a continuous       and circumferential pattern from the anus to the descending colon, which       was the extent of scope advancement. (purposely limited  exam due to       severity of colitis). No sites were spared. This was severe, and the       findings are new. Biopsies were taken with a cold forceps for histology.      Retroflexion in the rectum was not performed due to severity of       inflammation. Impression:               - Pancolitis ulcerative colitis. Inflammation was                            found from the anus to the descending colon.                            Biopsied. Moderate Sedation:      MAC sedation used Recommendation:           - Return patient to hospital ward for ongoing care.                           - Low fiber diet.                            - solumedrol 40 mg IV Q 8 hours                           disctoninue antibiotics                           - Await pathology results. Procedure Code(s):        --- Professional ---                           [phone], 36, Colonoscopy, flexible; with biopsy,                            single or multiple Diagnosis Code(s):        --- Professional ---                           K51.00, Ulcerative (chronic) pancolitis without                            complications                           R10.84, Generalized abdominal pain                           K52.9, Noninfective gastroenteritis and colitis,                            unspecified                           K92.1, Melena (includes Hematochezia)                           R93.3, Abnormal findings on diagnostic imaging of  other parts of digestive tract CPT copyright 2018 [employer]. All rights reserved. The codes documented in this report are preliminary and upon coder review may  be revised to meet current compliance requirements. Henry L. Loletha Carrow, MD 10/01/2018 8:33:41 AM This report has been signed electronically. Number of Addenda: 0

## 2018-10-01 NOTE — Plan of Care (Signed)
  Problem: Education: Goal: Knowledge of General Education information will improve Description Including pain rating scale, medication(s)/side effects and non-pharmacologic comfort measures Outcome: Progressing   

## 2018-10-01 NOTE — Progress Notes (Signed)
PROGRESS NOTE    Monica Ortega  LEX:[id number] DOB: 03-May-1991 DOA: 09/29/2018 PCP: [name], DO   Brief Narrative:  28 year old with no known past medical history came to the hospital with complaints of abdominal pain.  Patient states she has been having abdominal pain since September 12, 2018 and was initially diagnosed with colitis and also saw GI outpatient.  She was placed on Cipro and Flagyl but due to worsening of the symptoms came back to the hospital.  CT of the abdomen showed worsening of pancolitis and admitted to the hospital.  Colonoscopy done on 3/23 findings are consistent with ulcerative colitis therefore started on Solu-Medrol.  Antibiotics were discontinued.   Assessment & Plan:   Principal Problem:   Acute colitis Active Problems:   Abdominal pain  Abdominal pain with nausea, worsening of colitis Ulcerative colitis, new diagnosis -Initially failed outpatient treatment with Cipro Flagyl.  CT of the abdomen pelvis consistent with worsening of colitis.  Colonoscopy done on 3/23 showed ulcerative colitis. -Start Solu-Medrol IV, discontinue antibiotics. - GI following. -Antiemetics, pain control, IV fluids. Adv diet as tolerated.   Hyponatremia, resolved -Improving with fluids.   Hypokalemia/hypomagnesemia -Aggressive repletion ordered.  Moderate to severe protein calorie malnutrition Hypoalbuminemia -Encouraged to eat and drink oral diet.  Has been poor lately due to her GI symptoms.   DVT prophylaxis: SCDs Code Status: Full code Family Communication: None at bedside due to visitor restrictions Disposition Plan: Maintain inpatient stay  Consultants:   Gastroenterology  Procedures:   C scope 3/23- + UC  Antimicrobials:   Unasyn day 3 stopped 3/23   Subjective: Tolerated C scope today, no complaints.   Review of Systems Otherwise negative except as per HPI, including: General = no fevers, chills, dizziness, malaise, fatigue HEENT/EYES = negative  for pain, redness, loss of vision, double vision, blurred vision, loss of hearing, sore throat, hoarseness, dysphagia Cardiovascular= negative for chest pain, palpitation, murmurs, lower extremity swelling Respiratory/lungs= negative for shortness of breath, cough, hemoptysis, wheezing, mucus production Gastrointestinal= negative for nausea, vomiting,, abdominal pain, melena, hematemesis Genitourinary= negative for Dysuria, Hematuria, Change in Urinary Frequency MSK = Negative for arthralgia, myalgias, Back Pain, Joint swelling  Neurology= Negative for headache, seizures, numbness, tingling  Psychiatry= Negative for anxiety, depression, suicidal and homocidal ideation Allergy/Immunology= Medication/Food allergy as listed  Skin= Negative for Rash, lesions, ulcers, itching   Objective: Vitals:   10/01/18 0432 10/01/18 0734 10/01/18 0828 10/01/18 0840  BP: 110/69 116/69 116/76 116/73  Pulse: (!) 105 (!) 107 100 (!) 107  Resp:  14 (!) 24 (!) 23  Temp: 98.8 F (37.1 C) 98.8 F (37.1 C) 99 F (37.2 C)   TempSrc: Oral Oral Oral   SpO2: 98% 97% 99% 99%  Weight:      Height:        Intake/Output Summary (Last 24 hours) at 10/01/2018 0957 Last data filed at 10/01/2018 0524 Gross per 24 hour  Intake 4250.78 ml  Output -  Net 4250.78 ml   Filed Weights   09/29/18 0825 09/29/18 1100  Weight: 68 kg 68 kg    Examination:  Constitutional: NAD, calm, comfortable Eyes: PERRL, lids and conjunctivae normal ENMT: Mucous membranes are moist. Posterior pharynx clear of any exudate or lesions.Normal dentition.  Neck: normal, supple, no masses, no thyromegaly Respiratory: clear to auscultation bilaterally, no wheezing, no crackles. Normal respiratory effort. No accessory muscle use.  Cardiovascular: Regular rate and rhythm, no murmurs / rubs / gallops. No extremity edema. 2+ pedal pulses. No  carotid bruits.  Abdomen: no tenderness, no masses palpated. No hepatosplenomegaly. Bowel sounds  positive.  Musculoskeletal: no clubbing / cyanosis. No joint deformity upper and lower extremities. Good ROM, no contractures. Normal muscle tone.  Skin: no rashes, lesions, ulcers. No induration Neurologic: CN 2-12 grossly intact. Sensation intact, DTR normal. Strength 5/5 in all 4.  Psychiatric: Normal judgment and insight. Alert and oriented x 3. Normal mood.     Data Reviewed:   CBC: Recent Labs  Lab 09/25/18 1047 09/29/18 0826 09/30/18 0440 10/01/18 0430  WBC 12.9* 15.6* 10.1 10.6*  NEUTROABS 10.0* 11.6*  --   --   HGB 14.0 14.8 11.3* 10.4*  HCT 40.8 45.7 33.7* 33.8*  MCV 82.4 82.8 83.8 86.2  PLT 415.0* 501* 345 716   Basic Metabolic Panel: Recent Labs  Lab 09/25/18 1047 09/29/18 0826 09/30/18 0440 10/01/18 0430  NA 132* 125* 135 130*  K 3.5 3.7 3.7 3.2*  CL 94* 86* 103 99  CO2 25 23 25 25   GLUCOSE 118* 197* 139* 103*  BUN 9 13 <5* <5*  CREATININE 0.71 0.92 0.57 0.74  CALCIUM 8.3* 8.1* 7.3* 6.9*  MG  --   --   --  1.8   GFR: Estimated Creatinine Clearance: 97.4 mL/min (by C-G formula based on SCr of 0.74 mg/dL). Liver Function Tests: Recent Labs  Lab 09/25/18 1047 09/29/18 0826 09/30/18 0440 10/01/18 0430  AST 12 15 11* 18  ALT 15 16 10 11   ALKPHOS 58 47 35* 32*  BILITOT 0.4 0.7 0.5 0.4  PROT 6.8 6.3* 4.6* 4.3*  ALBUMIN 3.1* 2.1* 1.7* 1.6*   Recent Labs  Lab 09/29/18 0826  LIPASE 87*   No results for input(s): AMMONIA in the last 168 hours. Coagulation Profile: No results for input(s): INR, PROTIME in the last 168 hours. Cardiac Enzymes: No results for input(s): CKTOTAL, CKMB, CKMBINDEX, TROPONINI in the last 168 hours. BNP (last 3 results) No results for input(s): PROBNP in the last 8760 hours. HbA1C: No results for input(s): HGBA1C in the last 72 hours. CBG: No results for input(s): GLUCAP in the last 168 hours. Lipid Profile: No results for input(s): CHOL, HDL, LDLCALC, TRIG, CHOLHDL, LDLDIRECT in the last 72 hours. Thyroid Function  Tests: No results for input(s): TSH, T4TOTAL, FREET4, T3FREE, THYROIDAB in the last 72 hours. Anemia Panel: No results for input(s): VITAMINB12, FOLATE, FERRITIN, TIBC, IRON, RETICCTPCT in the last 72 hours. Sepsis Labs: No results for input(s): PROCALCITON, LATICACIDVEN in the last 168 hours.  No results found for this or any previous visit (from the past 240 hour(s)).       Radiology Studies: Ct Abdomen Pelvis W Contrast  Result Date: 09/29/2018 CLINICAL DATA:  Nausea and vomiting several days with lower abdominal pain. EXAM: CT ABDOMEN AND PELVIS WITH CONTRAST TECHNIQUE: Multidetector CT imaging of the abdomen and pelvis was performed using the standard protocol following bolus administration of intravenous contrast. CONTRAST:  170m OMNIPAQUE IOHEXOL 300 MG/ML  SOLN COMPARISON:  09/19/2018 FINDINGS: Lower chest: Lung bases are clear. Hepatobiliary: Liver, gallbladder and biliary tree are normal. Pancreas: Normal. Spleen: Normal. Adrenals/Urinary Tract: Adrenal glands are normal. Kidneys normal in size without hydronephrosis or nephrolithiasis. Ureters and bladder are normal. Stomach/Bowel: Stomach and small bowel are within normal. Appendix is normal. Interval worsening of wall thickening throughout the colon with subtle adjacent inflammatory change compatible with pancolitis of infectious or inflammatory nature. Vascular/Lymphatic: Normal. Reproductive: Normal. Other: No significant free fluid. Musculoskeletal: Normal. IMPRESSION: Mild interval worsening of pancolitis likely  of infectious or inflammatory nature. Electronically Signed   By: Marin Olp M.D.   On: 09/29/2018 10:31        Scheduled Meds: . methylPREDNISolone (SOLU-MEDROL) injection  40 mg Intravenous Q8H  . sodium chloride flush  3 mL Intravenous Q12H   Continuous Infusions: . sodium chloride 150 mL/hr at 10/01/18 0548  . ampicillin-sulbactam (UNASYN) IV 3 g (10/01/18 0401)  . magnesium sulfate 1 - 4 g bolus IVPB 2  g (10/01/18 0931)  . potassium chloride 10 mEq (10/01/18 0944)     LOS: 2 days   Time spent= 35 mins    Ankit Arsenio Loader, MD [facility]  If 7PM-7AM, please contact night-coverage [email or web address] 10/01/2018, 9:57 AM

## 2018-10-01 NOTE — Interval H&P Note (Signed)
History and Physical Interval Note:  10/01/2018 8:08 AM  Monica Ortega  has presented today for surgery, with the diagnosis of Colitis.  Bloody diarrhea..  The various methods of treatment have been discussed with the patient and family. After consideration of risks, benefits and other options for treatment, the patient has consented to  Procedure(s): COLONOSCOPY WITH PROPOFOL (N/A) as a surgical intervention.  The patient's history has been reviewed, patient examined, no change in status, stable for surgery.  I have reviewed the patient's chart and labs.  Questions were answered to the patient's satisfaction.     Nelida Meuse III

## 2018-10-02 ENCOUNTER — Ambulatory Visit: Payer: Self-pay | Admitting: Physician Assistant

## 2018-10-02 ENCOUNTER — Encounter (HOSPITAL_COMMUNITY): Payer: Self-pay | Admitting: Gastroenterology

## 2018-10-02 ENCOUNTER — Telehealth: Payer: Self-pay | Admitting: Internal Medicine

## 2018-10-02 ENCOUNTER — Telehealth: Payer: Self-pay

## 2018-10-02 LAB — COMPREHENSIVE METABOLIC PANEL
ALT: 16 U/L (ref 0–44)
AST: 22 U/L (ref 15–41)
Albumin: 1.8 g/dL — ABNORMAL LOW (ref 3.5–5.0)
Alkaline Phosphatase: 40 U/L (ref 38–126)
Anion gap: 8 (ref 5–15)
BUN: 5 mg/dL — ABNORMAL LOW (ref 6–20)
CO2: 24 mmol/L (ref 22–32)
Calcium: 7.8 mg/dL — ABNORMAL LOW (ref 8.9–10.3)
Chloride: 102 mmol/L (ref 98–111)
Creatinine, Ser: 0.73 mg/dL (ref 0.44–1.00)
GFR calc Af Amer: >60 mL/min (ref >60)
GFR calc non Af Amer: >60 mL/min (ref >60)
Glucose, Bld: 262 mg/dL — ABNORMAL HIGH (ref 70–99)
Potassium: 3.9 mmol/L (ref 3.5–5.1)
Sodium: 134 mmol/L — ABNORMAL LOW (ref 135–145)
Total Bilirubin: 0.5 mg/dL (ref 0.3–1.2)
Total Protein: 5 g/dL — ABNORMAL LOW (ref 6.5–8.1)

## 2018-10-02 LAB — CBC
HCT: 33.6 % — ABNORMAL LOW (ref 36.0–46.0)
Hemoglobin: 11 g/dL — ABNORMAL LOW (ref 12.0–15.0)
MCH: 27.7 pg (ref 26.0–34.0)
MCHC: 32.7 g/dL (ref 30.0–36.0)
MCV: 84.6 fL (ref 80.0–100.0)
Platelets: 337 10*3/uL (ref 150–400)
RBC: 3.97 MIL/uL (ref 3.87–5.11)
RDW: 12.3 % (ref 11.5–15.5)
WBC: 7 10*3/uL (ref 4.0–10.5)
nRBC: 0 % (ref 0.0–0.2)

## 2018-10-02 LAB — MAGNESIUM: Magnesium: 2.2 mg/dL (ref 1.7–2.4)

## 2018-10-02 NOTE — Telephone Encounter (Signed)
Pt scheduled to see Dr. Henrene Pastor 11/01/18@10 :45am. Azucena Freed PA aware.

## 2018-10-02 NOTE — Telephone Encounter (Signed)
Monica Ortega called wanting to sched a hsp f/u for Ulcer Colitis. Please advise on scheduling.

## 2018-10-02 NOTE — Progress Notes (Signed)
PROGRESS NOTE    Monica Ortega  FXT:[id number] DOB: October 05, 1990 DOA: 09/29/2018 PCP: [name], DO   Brief Narrative:  28 year old with no known past medical history came to the hospital with complaints of abdominal pain.  Patient states she has been having abdominal pain since September 12, 2018 and was initially diagnosed with colitis and also saw GI outpatient.  She was placed on Cipro and Flagyl but due to worsening of the symptoms came back to the hospital.  CT of the abdomen showed worsening of pancolitis and admitted to the hospital.  Colonoscopy done on 3/23 findings are consistent with ulcerative colitis therefore started on Solu-Medrol.  Antibiotics were discontinued.   Assessment & Plan:   Principal Problem:   Acute colitis Active Problems:   Abdominal pain  Abdominal pain with nausea, worsening of colitis; mild bloody stool Ulcerative colitis, new diagnosis -Initially failed outpatient treatment with Cipro Flagyl.  CT of the abdomen pelvis consistent with worsening of colitis.  Colonoscopy done on 3/23 showed ulcerative colitis. -Cont Solu-Medrol IV, discontinue antibiotics. - GI following. -Antiemetics, pain control, IV fluids. Adv diet as tolerated.   Hyponatremia, resolved -Improved.   Hypokalemia/hypomagnesemia -Aggressive repletion ordered.  Moderate to severe protein calorie malnutrition Hypoalbuminemia -Encourage PO intake.   DVT prophylaxis: SCDs Code Status: Full code Family Communication: None at bedside due to visitor restrictions Disposition Plan: Hospital stay for atleast another day until bloody bowel movements better. Hopefully in next 24 days  Consultants:   Gastroenterology  Procedures:   C scope 3/23- + UC  Antimicrobials:   Unasyn day 3 stopped 3/23   Subjective: Some bloody stools overnight. PO intake is better.   Review of Systems Otherwise negative except as per HPI, including: General = no fevers, chills, dizziness, malaise,  fatigue HEENT/EYES = negative for pain, redness, loss of vision, double vision, blurred vision, loss of hearing, sore throat, hoarseness, dysphagia Cardiovascular= negative for chest pain, palpitation, murmurs, lower extremity swelling Respiratory/lungs= negative for shortness of breath, cough, hemoptysis, wheezing, mucus production Gastrointestinal= negative for nausea, vomiting,, abdominal pain, melena, hematemesis Genitourinary= negative for Dysuria, Hematuria, Change in Urinary Frequency MSK = Negative for arthralgia, myalgias, Back Pain, Joint swelling  Neurology= Negative for headache, seizures, numbness, tingling  Psychiatry= Negative for anxiety, depression, suicidal and homocidal ideation Allergy/Immunology= Medication/Food allergy as listed  Skin= Negative for Rash, lesions, ulcers, itching  Objective: Vitals:   10/01/18 1048 10/01/18 1319 10/01/18 2125 10/02/18 0533  BP: 124/79 109/70 105/62 121/81  Pulse: (!) 105 95 87 83  Resp:  18    Temp: 98.5 F (36.9 C) 99.6 F (37.6 C) 97.7 F (36.5 C) 98.4 F (36.9 C)  TempSrc: Oral Oral Oral Oral  SpO2: 98% 98% 97% 99%  Weight:      Height:        Intake/Output Summary (Last 24 hours) at 10/02/2018 0902 Last data filed at 10/01/2018 1000 Gross per 24 hour  Intake 240 ml  Output -  Net 240 ml   Filed Weights   09/29/18 0825 09/29/18 1100  Weight: 68 kg 68 kg    Examination:  Constitutional: NAD, calm, comfortable Eyes: PERRL, lids and conjunctivae normal ENMT: Mucous membranes are moist. Posterior pharynx clear of any exudate or lesions.Normal dentition.  Neck: normal, supple, no masses, no thyromegaly Respiratory: clear to auscultation bilaterally, no wheezing, no crackles. Normal respiratory effort. No accessory muscle use.  Cardiovascular: Regular rate and rhythm, no murmurs / rubs / gallops. No extremity edema. 2+ pedal pulses. No  carotid bruits.  Abdomen: no tenderness, no masses palpated. No hepatosplenomegaly.  Bowel sounds positive.  Musculoskeletal: no clubbing / cyanosis. No joint deformity upper and lower extremities. Good ROM, no contractures. Normal muscle tone.  Skin: no rashes, lesions, ulcers. No induration Neurologic: CN 2-12 grossly intact. Sensation intact, DTR normal. Strength 5/5 in all 4.  Psychiatric: Normal judgment and insight. Alert and oriented x 3. Normal mood.    Data Reviewed:   CBC: Recent Labs  Lab 09/25/18 1047 09/29/18 0826 09/30/18 0440 10/01/18 0430 10/02/18 0355  WBC 12.9* 15.6* 10.1 10.6* 7.0  NEUTROABS 10.0* 11.6*  --   --   --   HGB 14.0 14.8 11.3* 10.4* 11.0*  HCT 40.8 45.7 33.7* 33.8* 33.6*  MCV 82.4 82.8 83.8 86.2 84.6  PLT 415.0* 501* 345 315 233   Basic Metabolic Panel: Recent Labs  Lab 09/25/18 1047 09/29/18 0826 09/30/18 0440 10/01/18 0430 10/02/18 0355  NA 132* 125* 135 130* 134*  K 3.5 3.7 3.7 3.2* 3.9  CL 94* 86* 103 99 102  CO2 25 23 25 25 24   GLUCOSE 118* 197* 139* 103* 262*  BUN 9 13 <5* <5* 5*  CREATININE 0.71 0.92 0.57 0.74 0.73  CALCIUM 8.3* 8.1* 7.3* 6.9* 7.8*  MG  --   --   --  1.8 2.2   GFR: Estimated Creatinine Clearance: 97.4 mL/min (by C-G formula based on SCr of 0.73 mg/dL). Liver Function Tests: Recent Labs  Lab 09/25/18 1047 09/29/18 0826 09/30/18 0440 10/01/18 0430 10/02/18 0355  AST 12 15 11* 18 22  ALT 15 16 10 11 16   ALKPHOS 58 47 35* 32* 40  BILITOT 0.4 0.7 0.5 0.4 0.5  PROT 6.8 6.3* 4.6* 4.3* 5.0*  ALBUMIN 3.1* 2.1* 1.7* 1.6* 1.8*   Recent Labs  Lab 09/29/18 0826  LIPASE 87*   No results for input(s): AMMONIA in the last 168 hours. Coagulation Profile: No results for input(s): INR, PROTIME in the last 168 hours. Cardiac Enzymes: No results for input(s): CKTOTAL, CKMB, CKMBINDEX, TROPONINI in the last 168 hours. BNP (last 3 results) No results for input(s): PROBNP in the last 8760 hours. HbA1C: No results for input(s): HGBA1C in the last 72 hours. CBG: No results for input(s): GLUCAP in  the last 168 hours. Lipid Profile: No results for input(s): CHOL, HDL, LDLCALC, TRIG, CHOLHDL, LDLDIRECT in the last 72 hours. Thyroid Function Tests: No results for input(s): TSH, T4TOTAL, FREET4, T3FREE, THYROIDAB in the last 72 hours. Anemia Panel: No results for input(s): VITAMINB12, FOLATE, FERRITIN, TIBC, IRON, RETICCTPCT in the last 72 hours. Sepsis Labs: No results for input(s): PROCALCITON, LATICACIDVEN in the last 168 hours.  No results found for this or any previous visit (from the past 240 hour(s)).       Radiology Studies: No results found.      Scheduled Meds: . methylPREDNISolone (SOLU-MEDROL) injection  40 mg Intravenous Q8H  . sodium chloride flush  3 mL Intravenous Q12H   Continuous Infusions:    LOS: 3 days   Time spent= 30 mins    Ankit Arsenio Loader, MD [facility]  If 7PM-7AM, please contact night-coverage [email or web address] 10/02/2018, 9:02 AM

## 2018-10-02 NOTE — Progress Notes (Addendum)
Daily Rounding Note  10/02/2018, 11:35 AM  LOS: 3 days   SUBJECTIVE:   Chief complaint: ulcerative colitis      Passing fewer stools, less blood present.  Less abd pain.  No N/V, tolerating soft diet.   Feels well enough to go home  OBJECTIVE:         Vital signs in last 24 hours:    Temp:  [97.7 F (36.5 C)-99.6 F (37.6 C)] 98.4 F (36.9 C) (03/24 0533) Pulse Rate:  [83-95] 83 (03/24 0533) Resp:  [18] 18 (03/23 1319) BP: (105-121)/(62-81) 121/81 (03/24 0533) SpO2:  [97 %-99 %] 99 % (03/24 0533) Last BM Date: 09/30/18 Filed Weights   09/29/18 0825 09/29/18 1100  Weight: 68 kg 68 kg   General: looks better, still pale and slightly unwell but not toxic   Heart: RRR Chest: clear bil.  No SOB or cough. Abdomen: soft, slight diffuse tenderness.  No G/R.  Active BS, no distention  Extremities: no CCE Neuro/Psych:  Fully alert and oriented.  No weakness or tremors  Intake/Output from previous day: 03/23 0701 - 03/24 0700 In: 240 [P.O.:240] Out: -   Intake/Output this shift: Total I/O In: 240 [P.O.:240] Out: -   Lab Results: Recent Labs    09/30/18 0440 10/01/18 0430 10/02/18 0355  WBC 10.1 10.6* 7.0  HGB 11.3* 10.4* 11.0*  HCT 33.7* 33.8* 33.6*  PLT 345 315 337   BMET Recent Labs    09/30/18 0440 10/01/18 0430 10/02/18 0355  NA 135 130* 134*  K 3.7 3.2* 3.9  CL 103 99 102  CO2 25 25 24   GLUCOSE 139* 103* 262*  BUN <5* <5* 5*  CREATININE 0.57 0.74 0.73  CALCIUM 7.3* 6.9* 7.8*   LFT Recent Labs    09/30/18 0440 10/01/18 0430 10/02/18 0355  PROT 4.6* 4.3* 5.0*  ALBUMIN 1.7* 1.6* 1.8*  AST 11* 18 22  ALT 10 11 16   ALKPHOS 35* 32* 40  BILITOT 0.5 0.4 0.5     ASSESMENT:   *  Ulcerative colitis, with pan-colonic involvement.  Diagnosis confirmed on Colonoscopy 3/23 but sxs of blood PR since 05/2018 and bloody diarrhea, abdominal pain, n/v starting 3/4.   Path: SEVERE ACTIVE COLITIS  WITH ULCERATION, CRYPTITIS AND CRYPT ABSCESS FORMATION c/w IBD, likely UC.   Day 2 IV Solumedrol.  Sxs better.    *   Hypoalbuminemia.     PLAN   *  Home today after labs collected: Hep B surface Ag and Quantiferon gold, anticipating start of biologics soon.  Begin Prednisone 40 mg/day for 1 week, 30 mg/day for 1 week, then 20 mg daily until seen by Dr Scarlette Shorts at GI office 4/23 at 10:45.      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  She was working up until recently despite her GI sxs.      Azucena Freed  10/02/2018, 11:35 AM Phone [phone]    Attending physician's note   I have taken an interval history, reviewed the chart and examined the patient. I agree with the Advanced Practitioner's note, impression and recommendations.   28 year old with new diagnosis of pan ulcerative colitis.  Did very well on Solu-Medrol, switch to p.o. prednisone (dose as above).  Will check hepatitis B surface antigen and TB Gold in anticipation of Biologics before discharge. FU with Dr. Henrene Pastor 4/23.  Will sign off.  Carmell Austria, MD

## 2018-10-03 LAB — COMPREHENSIVE METABOLIC PANEL
ALT: 26 U/L (ref 0–44)
AST: 29 U/L (ref 15–41)
Albumin: 1.8 g/dL — ABNORMAL LOW (ref 3.5–5.0)
Alkaline Phosphatase: 38 U/L (ref 38–126)
Anion gap: 5 (ref 5–15)
BUN: 7 mg/dL (ref 6–20)
CO2: 27 mmol/L (ref 22–32)
Calcium: 7.9 mg/dL — ABNORMAL LOW (ref 8.9–10.3)
Chloride: 103 mmol/L (ref 98–111)
Creatinine, Ser: 0.62 mg/dL (ref 0.44–1.00)
GFR calc Af Amer: >60 mL/min (ref >60)
GFR calc non Af Amer: >60 mL/min (ref >60)
Glucose, Bld: 324 mg/dL — ABNORMAL HIGH (ref 70–99)
Potassium: 4.3 mmol/L (ref 3.5–5.1)
Sodium: 135 mmol/L (ref 135–145)
Total Bilirubin: 0.3 mg/dL (ref 0.3–1.2)
Total Protein: 4.9 g/dL — ABNORMAL LOW (ref 6.5–8.1)

## 2018-10-03 LAB — CBC
HCT: 32.4 % — ABNORMAL LOW (ref 36.0–46.0)
Hemoglobin: 10.2 g/dL — ABNORMAL LOW (ref 12.0–15.0)
MCH: 26.9 pg (ref 26.0–34.0)
MCHC: 31.5 g/dL (ref 30.0–36.0)
MCV: 85.5 fL (ref 80.0–100.0)
Platelets: 312 10*3/uL (ref 150–400)
RBC: 3.79 MIL/uL — ABNORMAL LOW (ref 3.87–5.11)
RDW: 12.2 % (ref 11.5–15.5)
WBC: 8.1 10*3/uL (ref 4.0–10.5)
nRBC: 0 % (ref 0.0–0.2)

## 2018-10-03 LAB — MAGNESIUM: Magnesium: 2.2 mg/dL (ref 1.7–2.4)

## 2018-10-03 MED ORDER — PREDNISONE 10 MG PO TABS: ORAL_TABLET | ORAL | 0 refills | Status: DC

## 2018-10-03 NOTE — Progress Notes (Signed)
Nsg Discharge Note  Admit Date:  09/29/2018 Discharge date: 10/03/2018   Ziyon Cedotal to be D/C'd Home per MD order.  AVS completed.  Patient/caregiver able to verbalize understanding.  Discharge Medication: Allergies as of 10/03/2018   No Known Allergies     Medication List    TAKE these medications   aspirin-acetaminophen-caffeine 250-250-65 MG tablet Commonly known as:  EXCEDRIN MIGRAINE Take 2 tablets by mouth every 6 (six) hours as needed for headache.   ciprofloxacin 500 MG tablet Commonly known as:  CIPRO Take 1 tablet (500 mg total) by mouth 2 (two) times daily. Take twice daily for 7 days   HYDROcodone-acetaminophen 5-325 MG tablet Commonly known as:  Norco Take 1 tablet by mouth every 6 (six) hours as needed for moderate pain.   ibuprofen 200 MG tablet Commonly known as:  ADVIL,MOTRIN Take 400 mg by mouth every 6 (six) hours as needed for moderate pain.   loperamide 2 MG capsule Commonly known as:  IMODIUM Take 2 mg by mouth daily as needed for diarrhea or loose stools.   metroNIDAZOLE 500 MG tablet Commonly known as:  FLAGYL Take 1 tablet (500 mg total) by mouth 3 (three) times daily. Take three times daily for 7 days.   ondansetron 4 MG tablet Commonly known as:  ZOFRAN Take 1 tablet (4 mg total) by mouth every 6 (six) hours.   predniSONE 10 MG tablet Commonly known as:  DELTASONE Take 4 tablets (40 mg total) by mouth daily with breakfast for 7 days, THEN 3 tablets (30 mg total) daily with breakfast for 7 days, THEN 2 tablets (20 mg total) daily with breakfast for 17 days. Start taking on:  October 03, 2018   promethazine 25 MG tablet Commonly known as:  PHENERGAN Take 1 tablet (25 mg total) by mouth every 6 (six) hours as needed for nausea or vomiting.   traMADol 50 MG tablet Commonly known as:  Ultram Take 1 tablet (50 mg total) by mouth every 6 (six) hours as needed.       Discharge Assessment: Vitals:   10/02/18 2054 10/03/18 0424  BP: 109/80  107/73  Pulse: 86 68  Resp: 16 17  Temp: 98 F (36.7 C) (!) 97.4 F (36.3 C)  SpO2: 98% 97%   Skin clean, dry and intact without evidence of skin break down, no evidence of skin tears noted. IV catheter discontinued intact. Site without signs and symptoms of complications - no redness or edema noted at insertion site, patient denies c/o pain - only slight tenderness at site.  Dressing with slight pressure applied.  D/c Instructions-Education: Discharge instructions given to patient/family with verbalized understanding. D/c education completed with patient/family including follow up instructions, medication list, d/c activities limitations if indicated, with other d/c instructions as indicated by MD - patient able to verbalize understanding, all questions fully answered. Patient instructed to return to ED, call 911, or call MD for any changes in condition.  Patient escorted via [place], and D/C home via private auto.  Hiram Comber, RN 10/03/2018 8:14 AM

## 2018-10-03 NOTE — Discharge Summary (Signed)
Physician Discharge Summary  Cyril Railey SWH:[id number] DOB: 1991/06/26 DOA: 09/29/2018  PCP: [name], DO  Admit date: 09/29/2018 Discharge date: 10/03/2018  Admitted From: Home  Disposition:  Home   Recommendations for Outpatient Follow-up:  1. Follow up with PCP in 1-2 weeks 2. Please obtain BMP/CBC in one week your next doctors visit.  3. 4-week prednisone taper.  40 mg for 1 week, 30 mg for 1 week and 20 mg daily until seen by gastroenterology outpatient. 4. She has outpatient gastroenterology appointment on April 23.  Home Health: Home Equipment/Devices: None Discharge Condition: Stable CODE STATUS: Full Diet recommendation: Diet as tolerated  Brief/Interim Summary: 28 year old with no known past medical history came to the hospital with complaints of abdominal pain.  Patient states she has been having abdominal pain since September 12, 2018 and was initially diagnosed with colitis and also saw GI outpatient.  She was placed on Cipro and Flagyl but due to worsening of the symptoms came back to the hospital.  CT of the abdomen showed worsening of pancolitis and admitted to the hospital.  Colonoscopy done on 3/23 findings are consistent with ulcerative colitis therefore started on Solu-Medrol.  Antibiotics were discontinued.  Patient was transitioned to oral steroids for 1 month taper and follow-up outpatient with gastroenterology.  She was tolerating oral diet without any issues. At this time she is reached maximal benefit from hospital stay and stable for discharge with outpatient follow-up recommendations as stated above.   Discharge Diagnoses:  Principal Problem:   Acute colitis Active Problems:   Abdominal pain  Ulcerative colitis, new diagnosis -Discontinue antibiotics.  CT of the abdomen pelvis consistent with colitis.  Colonoscopy done on 3/23 confirmed ulcerative colitis.  Transition IV Solu-Medrol to p.o. prednisone for 4-week taper.  Discontinue antibiotics.  Diet as  tolerated.  Follow-up outpatient gastroenterology on October 01, 2018.  Moderate protein calorie malnutrition with hypoalbuminemia -Have encouraged her to increase her oral intake.  Consultations:  Gastroenterology  Subjective: Feels better.  No complaints.  No rectal bleeding overnight.  Tolerating oral diet.  Discharge Exam: Vitals:   10/02/18 2054 10/03/18 0424  BP: 109/80 107/73  Pulse: 86 68  Resp: 16 17  Temp: 98 F (36.7 C) (!) 97.4 F (36.3 C)  SpO2: 98% 97%   Vitals:   10/02/18 0533 10/02/18 1433 10/02/18 2054 10/03/18 0424  BP: 121/81 111/70 109/80 107/73  Pulse: 83 82 86 68  Resp:  16 16 17   Temp: 98.4 F (36.9 C) 97.8 F (36.6 C) 98 F (36.7 C) (!) 97.4 F (36.3 C)  TempSrc: Oral Oral Oral Oral  SpO2: 99% 98% 98% 97%  Weight:      Height:        General: Pt is alert, awake, not in acute distress Cardiovascular: RRR, S1/S2 +, no rubs, no gallops Respiratory: CTA bilaterally, no wheezing, no rhonchi Abdominal: Soft, NT, ND, bowel sounds + Extremities: no edema, no cyanosis  Discharge Instructions   Allergies as of 10/03/2018   No Known Allergies     Medication List    TAKE these medications   aspirin-acetaminophen-caffeine 250-250-65 MG tablet Commonly known as:  EXCEDRIN MIGRAINE Take 2 tablets by mouth every 6 (six) hours as needed for headache.   ciprofloxacin 500 MG tablet Commonly known as:  CIPRO Take 1 tablet (500 mg total) by mouth 2 (two) times daily. Take twice daily for 7 days   HYDROcodone-acetaminophen 5-325 MG tablet Commonly known as:  Norco Take 1 tablet by mouth  every 6 (six) hours as needed for moderate pain.   ibuprofen 200 MG tablet Commonly known as:  ADVIL,MOTRIN Take 400 mg by mouth every 6 (six) hours as needed for moderate pain.   loperamide 2 MG capsule Commonly known as:  IMODIUM Take 2 mg by mouth daily as needed for diarrhea or loose stools.   metroNIDAZOLE 500 MG tablet Commonly known as:  FLAGYL Take 1  tablet (500 mg total) by mouth 3 (three) times daily. Take three times daily for 7 days.   ondansetron 4 MG tablet Commonly known as:  ZOFRAN Take 1 tablet (4 mg total) by mouth every 6 (six) hours.   predniSONE 10 MG tablet Commonly known as:  DELTASONE Take 4 tablets (40 mg total) by mouth daily with breakfast for 7 days, THEN 3 tablets (30 mg total) daily with breakfast for 7 days, THEN 2 tablets (20 mg total) daily with breakfast for 17 days. Start taking on:  October 03, 2018   promethazine 25 MG tablet Commonly known as:  PHENERGAN Take 1 tablet (25 mg total) by mouth every 6 (six) hours as needed for nausea or vomiting.   traMADol 50 MG tablet Commonly known as:  Ultram Take 1 tablet (50 mg total) by mouth every 6 (six) hours as needed.      Follow-up Information    Irene Shipper, MD Follow up on 11/01/2018.   Specialty:  Gastroenterology Why:  10:45 appointment to follow up colitis Contact information: [address] [address] [phone]        [name], DO. Schedule an appointment as soon as possible for a visit in 1 week(s).   Specialty:  Family Medicine Contact information: [place] [address] [phone]          No Known Allergies  You were cared for by a hospitalist during your hospital stay. If you have any questions about your discharge medications or the care you received while you were in the hospital after you are discharged, you can call the unit and asked to speak with the hospitalist on call if the hospitalist that took care of you is not available. Once you are discharged, your primary care physician will handle any further medical issues. Please note that no refills for any discharge medications will be authorized once you are discharged, as it is imperative that you return to your primary care physician (or establish a relationship with a primary care physician if you do not have one) for your aftercare  needs so that they can reassess your need for medications and monitor your lab values.   Procedures/Studies: Ct Abdomen Pelvis W Contrast  Result Date: 09/29/2018 CLINICAL DATA:  Nausea and vomiting several days with lower abdominal pain. EXAM: CT ABDOMEN AND PELVIS WITH CONTRAST TECHNIQUE: Multidetector CT imaging of the abdomen and pelvis was performed using the standard protocol following bolus administration of intravenous contrast. CONTRAST:  129m OMNIPAQUE IOHEXOL 300 MG/ML  SOLN COMPARISON:  09/19/2018 FINDINGS: Lower chest: Lung bases are clear. Hepatobiliary: Liver, gallbladder and biliary tree are normal. Pancreas: Normal. Spleen: Normal. Adrenals/Urinary Tract: Adrenal glands are normal. Kidneys normal in size without hydronephrosis or nephrolithiasis. Ureters and bladder are normal. Stomach/Bowel: Stomach and small bowel are within normal. Appendix is normal. Interval worsening of wall thickening throughout the colon with subtle adjacent inflammatory change compatible with pancolitis of infectious or inflammatory nature. Vascular/Lymphatic: Normal. Reproductive: Normal. Other: No significant free fluid. Musculoskeletal: Normal. IMPRESSION: Mild interval  worsening of pancolitis likely of infectious or inflammatory nature. Electronically Signed   By: Marin Olp M.D.   On: 09/29/2018 10:31   Ct Abdomen Pelvis W Contrast  Result Date: 09/19/2018 CLINICAL DATA:  Elevated lipase with abdominal pain and diarrhea. EXAM: CT ABDOMEN AND PELVIS WITH CONTRAST TECHNIQUE: Multidetector CT imaging of the abdomen and pelvis was performed using the standard protocol following bolus administration of intravenous contrast. CONTRAST:  167m OMNIPAQUE IOHEXOL 300 MG/ML  SOLN COMPARISON:  None. FINDINGS: Lower chest: Clear lung bases. Normal heart size without pericardial or pleural effusion. Hepatobiliary: Normal liver. Normal gallbladder, without biliary ductal dilatation. Pancreas: Normal, without mass or  ductal dilatation. Spleen: Normal in size, without focal abnormality. Adrenals/Urinary Tract: Normal adrenal glands. Normal kidneys, without hydronephrosis. Normal urinary bladder. Stomach/Bowel: Normal stomach, without wall thickening. Colonic wall thickening and mild mucosal hyperenhancement are relatively diffuse, with equivocal sparing of the rectum. Normal terminal ileum and appendix. Normal small bowel. Vascular/Lymphatic: Normal caliber of the aorta and branch vessels. Mildly prominent ileocolic mesenteric nodes are likely reactive. Reproductive: Normal uterus and adnexa. Other: Small volume pelvic fluid is favored to be related to the colonic process. Slightly greater than typically seen physiologically. Musculoskeletal: No acute osseous abnormality. IMPRESSION: 1. Relatively diffuse colitis. Differential considerations include infection (exclude C difficile) or inflammation (ulcerative colitis). 2. Small volume pelvic fluid, likely secondary. Electronically Signed   By: KAbigail MiyamotoM.D.   On: 09/19/2018 19:18      The results of significant diagnostics from this hospitalization (including imaging, microbiology, ancillary and laboratory) are listed below for reference.     Microbiology: No results found for this or any previous visit (from the past 240 hour(s)).   Labs: BNP (last 3 results) No results for input(s): BNP in the last 8760 hours. Basic Metabolic Panel: Recent Labs  Lab 09/29/18 0826 09/30/18 0440 10/01/18 0430 10/02/18 0355 10/03/18 0337  NA 125* 135 130* 134* 135  K 3.7 3.7 3.2* 3.9 4.3  CL 86* 103 99 102 103  CO2 23 25 25 24 27   GLUCOSE 197* 139* 103* 262* 324*  BUN 13 <5* <5* 5* 7  CREATININE 0.92 0.57 0.74 0.73 0.62  CALCIUM 8.1* 7.3* 6.9* 7.8* 7.9*  MG  --   --  1.8 2.2 2.2   Liver Function Tests: Recent Labs  Lab 09/29/18 0826 09/30/18 0440 10/01/18 0430 10/02/18 0355 10/03/18 0337  AST 15 11* 18 22 29   ALT 16 10 11 16 26   ALKPHOS 47 35* 32* 40 38   BILITOT 0.7 0.5 0.4 0.5 0.3  PROT 6.3* 4.6* 4.3* 5.0* 4.9*  ALBUMIN 2.1* 1.7* 1.6* 1.8* 1.8*   Recent Labs  Lab 09/29/18 0826  LIPASE 87*   No results for input(s): AMMONIA in the last 168 hours. CBC: Recent Labs  Lab 09/29/18 0826 09/30/18 0440 10/01/18 0430 10/02/18 0355 10/03/18 0337  WBC 15.6* 10.1 10.6* 7.0 8.1  NEUTROABS 11.6*  --   --   --   --   HGB 14.8 11.3* 10.4* 11.0* 10.2*  HCT 45.7 33.7* 33.8* 33.6* 32.4*  MCV 82.8 83.8 86.2 84.6 85.5  PLT 501* 345 315 337 312   Cardiac Enzymes: No results for input(s): CKTOTAL, CKMB, CKMBINDEX, TROPONINI in the last 168 hours. BNP: Invalid input(s): POCBNP CBG: No results for input(s): GLUCAP in the last 168 hours. D-Dimer No results for input(s): DDIMER in the last 72 hours. Hgb A1c No results for input(s): HGBA1C in the last 72 hours. Lipid Profile No  results for input(s): CHOL, HDL, LDLCALC, TRIG, CHOLHDL, LDLDIRECT in the last 72 hours. Thyroid function studies No results for input(s): TSH, T4TOTAL, T3FREE, THYROIDAB in the last 72 hours.  Invalid input(s): FREET3 Anemia work up No results for input(s): VITAMINB12, FOLATE, FERRITIN, TIBC, IRON, RETICCTPCT in the last 72 hours. Urinalysis    Component Value Date/Time   COLORURINE YELLOW 09/29/2018 1321   APPEARANCEUR CLEAR 09/29/2018 1321   LABSPEC >1.046 (H) 09/29/2018 1321   PHURINE 5.0 09/29/2018 1321   GLUCOSEU NEGATIVE 09/29/2018 1321   HGBUR SMALL (A) 09/29/2018 1321   BILIRUBINUR NEGATIVE 09/29/2018 1321   KETONESUR 20 (A) 09/29/2018 1321   PROTEINUR NEGATIVE 09/29/2018 1321   NITRITE NEGATIVE 09/29/2018 1321   LEUKOCYTESUR TRACE (A) 09/29/2018 1321   Sepsis Labs Invalid input(s): PROCALCITONIN,  WBC,  LACTICIDVEN Microbiology No results found for this or any previous visit (from the past 240 hour(s)).   Time coordinating discharge:  I have spent 35 minutes face to face with the patient and on the ward discussing the patients care,  assessment, plan and disposition with other care givers. >50% of the time was devoted counseling the patient about the risks and benefits of treatment/Discharge disposition and coordinating care.   SIGNED:   Damita Lack, MD  [facility] 10/03/2018, 9:26 AM   If 7PM-7AM, please contact night-coverage [email or web address]

## 2018-10-03 NOTE — Plan of Care (Signed)
  Problem: Education: Goal: Knowledge of General Education information will improve Description Including pain rating scale, medication(s)/side effects and non-pharmacologic comfort measures Outcome: Progressing   Problem: Health Behavior/Discharge Planning: Goal: Ability to manage health-related needs will improve Outcome: Progressing   Problem: Activity: Goal: Risk for activity intolerance will decrease Outcome: Progressing   Problem: Nutrition: Goal: Adequate nutrition will be maintained Outcome: Progressing   Problem: Coping: Goal: Level of anxiety will decrease Outcome: Progressing   Problem: Elimination: Goal: Will not experience complications related to bowel motility Outcome: Progressing Goal: Will not experience complications related to urinary retention Outcome: Progressing   Problem: Pain Managment: Goal: General experience of comfort will improve Outcome: Progressing   Problem: Safety: Goal: Ability to remain free from injury will improve Outcome: Progressing   Problem: Skin Integrity: Goal: Risk for impaired skin integrity will decrease Outcome: Progressing

## 2018-10-04 LAB — QUANTIFERON-TB GOLD PLUS: QuantiFERON-TB Gold Plus: UNDETERMINED

## 2018-10-04 LAB — HEPATITIS B SURFACE ANTIGEN: Hepatitis B Surface Ag: NEGATIVE

## 2018-10-04 LAB — QUANTIFERON-TB GOLD PLUS (RQFGPL)
QuantiFERON Mitogen Value: 0.02 IU/mL
QuantiFERON Nil Value: 0.02 IU/mL
QuantiFERON TB1 Ag Value: 0.02 IU/mL
QuantiFERON TB2 Ag Value: 0.02 IU/mL

## 2018-10-05 ENCOUNTER — Telehealth: Payer: Self-pay | Admitting: Internal Medicine

## 2018-10-05 ENCOUNTER — Telehealth: Payer: Self-pay | Admitting: *Deleted

## 2018-10-05 ENCOUNTER — Ambulatory Visit (INDEPENDENT_AMBULATORY_CARE_PROVIDER_SITE_OTHER): Payer: Self-pay | Admitting: Family Medicine

## 2018-10-05 ENCOUNTER — Other Ambulatory Visit: Payer: Self-pay

## 2018-10-05 DIAGNOSIS — R109 Unspecified abdominal pain: Secondary | ICD-10-CM

## 2018-10-05 DIAGNOSIS — K921 Melena: Secondary | ICD-10-CM

## 2018-10-05 DIAGNOSIS — K529 Noninfective gastroenteritis and colitis, unspecified: Secondary | ICD-10-CM

## 2018-10-05 MED ORDER — HYDROCODONE-ACETAMINOPHEN 5-325 MG PO TABS: 1.0000 | ORAL_TABLET | Freq: Four times a day (QID) | ORAL | 0 refills | Status: DC | PRN

## 2018-10-05 NOTE — Telephone Encounter (Signed)
Monica Ortega pt newly diagnosed with UC is calling stating she is having a lot of pain, reports Tramadol is not working. Pt is currently taking prednisone 50m daily. She is requesting something different for pain. Dr. DLoletha Carrowas DOD please advise.

## 2018-10-05 NOTE — Progress Notes (Signed)
Patient ID: Monica Ortega, female   DOB: 07-26-1990, 28 y.o.   MRN: [id number]  Virtual Visit via Video Note  I connected with Monica Ortega on 10/05/18 at 11:00 AM EDT by a video enabled telemedicine application and verified that I am speaking with the correct person using two identifiers.  Location patient: home Location provider:work or home office Persons participating in the virtual visit: patient, provider  I discussed the limitations of evaluation and management by telemedicine and the availability of in person appointments. The patient expressed understanding and agreed to proceed.   HPI: Patient was contacted by WebEx visit after calling in earlier today stating that she was having some abdominal pain and bloody stools.  She was recently admitted 09/29/2018 through 10/03/2018 with abdominal pain.  She states she had onset of abdominal pain around March 4.  She was initially treated with Cipro and Flagyl but symptoms worsened.  CT abdomen showed evidence for pancolitis.  Colonoscopy on 3/23 showed findings consistent with ulcerative colitis.  She was started on Solu-Medrol and did see some improvement.  She was transitioned to oral steroids and is currently taking prednisone 40 mg daily.  She is tolerating liquids and some soft foods.  She states she has some intermittent abdominal cramp-like pains.  She has had some increase in diarrhea over the past couple days with about 5 bloody stools per day.  She had started herself on some MiraLAX a few days ago.  She does have some general decreased appetite.  Denies any recent nonsteroidal use.  She has scheduled follow-up with GI around the end of April.   ROS: See pertinent positives and negatives per HPI.  Past Medical History:  Diagnosis Date  . Ankle sprain   . Anxiety   . Depression    denies hospitalization or medication    Past Surgical History:  Procedure Laterality Date  . BIOPSY  10/01/2018   Procedure: BIOPSY;   Surgeon: Doran Stabler, MD;  Location: [facility];  Service: Endoscopy;;  . COLONOSCOPY WITH PROPOFOL N/A 10/01/2018   Procedure: COLONOSCOPY WITH PROPOFOL;  Surgeon: Doran Stabler, MD;  Location: [place];  Service: Endoscopy;  Laterality: N/A;  . WISDOM TOOTH EXTRACTION      Family History  Problem Relation Age of Onset  . Diabetes Mother   . Hypertension Father     SOCIAL HX: non-smoker   Current Outpatient Medications:  .  aspirin-acetaminophen-caffeine (EXCEDRIN MIGRAINE) 250-250-65 MG tablet, Take 2 tablets by mouth every 6 (six) hours as needed for headache., Disp: , Rfl:  .  ciprofloxacin (CIPRO) 500 MG tablet, Take 1 tablet (500 mg total) by mouth 2 (two) times daily. Take twice daily for 7 days, Disp: 14 tablet, Rfl: 0 .  HYDROcodone-acetaminophen (NORCO) 5-325 MG tablet, Take 1 tablet by mouth every 6 (six) hours as needed for moderate pain., Disp: 8 tablet, Rfl: 0 .  ibuprofen (ADVIL,MOTRIN) 200 MG tablet, Take 400 mg by mouth every 6 (six) hours as needed for moderate pain., Disp: , Rfl:  .  loperamide (IMODIUM) 2 MG capsule, Take 2 mg by mouth daily as needed for diarrhea or loose stools., Disp: , Rfl:  .  metroNIDAZOLE (FLAGYL) 500 MG tablet, Take 1 tablet (500 mg total) by mouth 3 (three) times daily. Take three times daily for 7 days., Disp: 21 tablet, Rfl: 0 .  ondansetron (ZOFRAN) 4 MG tablet, Take 1 tablet (4 mg total) by mouth every 6 (six) hours. (Patient not taking: Reported  on 09/29/2018), Disp: 12 tablet, Rfl: 0 .  predniSONE (DELTASONE) 10 MG tablet, Take 4 tablets (40 mg total) by mouth daily with breakfast for 7 days, THEN 3 tablets (30 mg total) daily with breakfast for 7 days, THEN 2 tablets (20 mg total) daily with breakfast for 17 days., Disp: 83 tablet, Rfl: 0 .  promethazine (PHENERGAN) 25 MG tablet, Take 1 tablet (25 mg total) by mouth every 6 (six) hours as needed for nausea or vomiting., Disp: 15 tablet, Rfl: 2 .  traMADol (ULTRAM) 50 MG  tablet, Take 1 tablet (50 mg total) by mouth every 6 (six) hours as needed., Disp: 20 tablet, Rfl: 1  EXAM:  VITALS per patient if applicable:  GENERAL: alert, oriented, appears well and in no acute distress  HEENT: atraumatic, conjunttiva clear, no obvious abnormalities on inspection of external nose and ears  NECK: normal movements of the head and neck  LUNGS: on inspection no signs of respiratory distress, breathing rate appears normal, no obvious gross SOB, gasping or wheezing  CV: no obvious cyanosis  MS: moves all visible extremities without noticeable abnormality  PSYCH/NEURO: pleasant and cooperative, no obvious depression or anxiety, speech and thought processing grossly intact  ASSESSMENT AND PLAN:  Discussed the following assessment and plan:  Acute colitis.  Likely ulcerative colitis.  Patient currently on prednisone 40 mg daily but has had some recurrence of bloody stools and intermittent abdominal cramping  -Avoid non-steroidals -May take some Tylenol as needed -Avoid any further MiraLAX for now -Continue prednisone as per hospital discharge instructions. -Keep follow-up with GI as scheduled    I discussed the assessment and treatment plan with the patient. The patient was provided an opportunity to ask questions and all were answered. The patient agreed with the plan and demonstrated an understanding of the instructions.   The patient was advised to call back or seek an in-person evaluation if the symptoms worsen or if the condition fails to improve as anticipated.  I provided 15 minutes of non-face-to-face time during this encounter.   Carolann Littler, MD

## 2018-10-05 NOTE — Telephone Encounter (Signed)
Pt. Called and notified of Norco prescription being called in by Dr. Loletha Carrow. Gave instructions on taking. Pt. Had already stopped Cipro and Flagyl. Instructed pt. To stay hydrated and start Iron tabs.

## 2018-10-05 NOTE — Telephone Encounter (Signed)
Spoke to Pt. And called Pharmacy, the only Narcotic that was fillled was on 09/20/18 for 8 tabs on Norco 5/325. Pt. Is out of this.Please advise

## 2018-10-05 NOTE — Telephone Encounter (Signed)
Pt sister called in with the pt she stating that she is a lot of pain since coming home from the [facility] she is taking traMADol (ULTRAM) 50 MG tablet [[id number]]  But she states that is not working and will like something stronger to manage pain. She also reported light headiness when she get up to walk.

## 2018-10-05 NOTE — Telephone Encounter (Signed)
Stop the tramadol.  I will send a prescription to her pharmacy today for [place], and she can take 1 tablet every 6 hours as needed for pain.  Do not drive if taking this medicine. I will prescribe sufficient supply to least get through the weekend, and then Dr. Henrene Pastor can attend to her when he returns the office next week. If she is still taking ciprofloxacin and metronidazole, as seems to have been included with discharge summary, those can be stopped as well.  She is most likely feeling lightheaded because she has not eaten very much with this illness, and she is also anemic from the blood loss.  I would not advocate starting iron tablets right now since her stomach is feeling unwell.  Stay hydrated, diet as tolerated.

## 2018-10-05 NOTE — Telephone Encounter (Signed)
Called pt. Back and changed instructions, to NOT take Iron at this time.

## 2018-10-05 NOTE — Telephone Encounter (Signed)
I have sent the invite to the email listed for sister. Here is her information in case you need it.

## 2018-10-05 NOTE — Telephone Encounter (Signed)
The discharge summary indicates that she was sent home with both tramadol and hydrocodone/acetaminophen (Norco).  Will you please find out if this is accurate guarding the Norco.  Then, please contact her pharmacy to see if a prescription for that has been filled (I am having difficulty assessing the [place] controlled substance database).  Let me know and I will proceed accordingly.

## 2018-10-05 NOTE — Telephone Encounter (Signed)
Copied from [place] [id number]. Topic: Appointment Scheduling - Scheduling Inquiry for Clinic >> Oct 05, 2018  8:27 AM Scherrie Gerlach wrote: Reason for CRM: pt is with her sister, [name] who is on the [facility]. Samantha phone number is [phone].  her email is  [email or web address]. This will be the way to facetime or connect.  Thank you

## 2018-10-09 NOTE — Telephone Encounter (Signed)
[name], Dr. Loletha Carrow performed colonoscopy in the hospital and has diagnosed the patient with ulcerative colitis.  Please see what medications she is taking, and in what dosages, for ulcerative colitis.  Thank you Dr. Henrene Pastor

## 2018-10-09 NOTE — Telephone Encounter (Signed)
Pt states she is taking prednisone 75m daily but tomorrow she drops down to 373mdaily. She is also taking Norco and Tramadol as needed for pain. Reports she is not taking any nausea meds or Imodium now.

## 2018-10-09 NOTE — Telephone Encounter (Signed)
She needs to be on maintenance medication for ulcerative colitis. 1.  Please start Lialda 4.8 g daily; 1.2 g tablets; 4 daily; dispense 120; 6 refills 2.  Taper prednisone by 10 mg every 2 weeks 3.  Discourage narcotics for abdominal pain.  I will not prescribe more beyond what she has currently 4.  Make sure she has an office follow-up with myself or advanced practitioner in 2 weeks

## 2018-10-10 ENCOUNTER — Telehealth: Payer: Self-pay | Admitting: Internal Medicine

## 2018-10-10 MED ORDER — MESALAMINE 1.2 G PO TBEC: 4.8000 g | DELAYED_RELEASE_TABLET | Freq: Every day | ORAL | 6 refills | Status: DC

## 2018-10-10 MED ORDER — ONDANSETRON HCL 4 MG PO TABS: 4.0000 mg | ORAL_TABLET | Freq: Four times a day (QID) | ORAL | 3 refills | Status: DC

## 2018-10-10 NOTE — Telephone Encounter (Signed)
Pt aware script sent to pharmacy.

## 2018-10-10 NOTE — Telephone Encounter (Signed)
Ok to refill zofran . Several ok

## 2018-10-10 NOTE — Addendum Note (Signed)
Addended by: Rosanne Sack R on: 10/10/2018 09:10 AM   Modules accepted: Orders

## 2018-10-10 NOTE — Telephone Encounter (Signed)
Spoke with pt and she is aware of Dr. Blanch Media recommendations. Pt already has OV scheduled. Script sent to pharmacy for pt.

## 2018-10-10 NOTE — Telephone Encounter (Signed)
Spoke with pt and she is having some nausea, reports she vomited last night. She has a few zofran 49m tablets left but will run out. Please advise.

## 2018-10-15 ENCOUNTER — Telehealth: Payer: Self-pay | Admitting: Internal Medicine

## 2018-10-15 NOTE — Telephone Encounter (Signed)
Contacted Monica Ortega. She sates she wakes up in the mornings with abdominal pain and diarrhea with some rectal bleeding, the pain can be pretty significant before taking her morning medications.  Pt is currently on Prednisone 30 mg in the AM and is on Lialda 1.2 G 4 tabs a day she also take in the AM. Is there any additional treatment she can add to her current regimen per her request, something to take at night so she doesn't wake up with pains.

## 2018-10-15 NOTE — Telephone Encounter (Signed)
Pt reported that she has pain in the morning.  She stated that she is currently taking prednisone and mesalamine.  Please CB to advise.

## 2018-10-16 MED ORDER — DICYCLOMINE HCL 10 MG PO CAPS: ORAL_CAPSULE | ORAL | 1 refills | Status: DC

## 2018-10-16 NOTE — Telephone Encounter (Signed)
Patient has been notified and aware. She agrees to try the Bentyl

## 2018-10-16 NOTE — Telephone Encounter (Signed)
Bentyl 10 mg; #60 MI: Take 1 or 2 by mouth every 4-6 hours as needed for pain.  1 refill

## 2018-10-16 NOTE — Addendum Note (Signed)
Addended by: Elias Else on: 10/16/2018 09:54 AM   Modules accepted: Orders

## 2018-10-18 ENCOUNTER — Telehealth: Payer: Self-pay | Admitting: Internal Medicine

## 2018-10-18 ENCOUNTER — Telehealth: Payer: Self-pay | Admitting: *Deleted

## 2018-10-18 NOTE — Telephone Encounter (Signed)
I called the pt and informed her of the message below.  Patient stated she will call her GI doctor as she does feel she needs to go to the ER.

## 2018-10-18 NOTE — Telephone Encounter (Signed)
Patient's sister notified of recommendations.  They will call back for any additional questions or concerns

## 2018-10-18 NOTE — Telephone Encounter (Signed)
I do not believe that she has been on pain medicine long enough to have withdrawal symptoms.  For her body aches and headache I recommend extra strength Tylenol.  Should she develop fever, she should contact her PCP to make sure that she has not contracted coronavirus.  Otherwise, have her give [place] follow-up on Monday.  Also, make sure that she is taking mesalamine.  I wanted her on Lialda 4.8 g daily.  Thanks

## 2018-10-18 NOTE — Telephone Encounter (Signed)
So sorry she does not feel well.  Recommend virtual visit with her GI or one of [place] to further evaluate. If very sick would need to go to the hospital.

## 2018-10-18 NOTE — Telephone Encounter (Signed)
Patient stated she would like to know what to do for abdominal and pain all over her body in order not to take Hydrocodone that was given in the ER.  Patient wanted to know if the pain all over means she is having opoid withdrawal symptoms since she is not taken Hydrocodone in the past 2 days?  Patient stated she did take Dicyclomine with no relief.  Message sent to Dr Maudie Mercury.

## 2018-10-18 NOTE — Telephone Encounter (Signed)
Pt's sister called and inquired how pt can taper off of her two pain medications.

## 2018-10-18 NOTE — Telephone Encounter (Signed)
Spoke with the sister who has the patient in the room. Concerned she is withdrawing from the pain medications. She stopped taking Hydrocodone and Tramadol 2 days ago. Today she feels body aches "all over." She has a headache. Afebrile. No cough. States watery diarrhea x2 today. Thinks she had around 5 diarrhea stools yesterday. She is on prednisone 30 mg daily until 10/23/18 when she will decrease it again. Please advise.

## 2018-10-22 ENCOUNTER — Ambulatory Visit (INDEPENDENT_AMBULATORY_CARE_PROVIDER_SITE_OTHER): Payer: BLUE CROSS/BLUE SHIELD | Admitting: Psychology

## 2018-10-22 ENCOUNTER — Encounter: Payer: Self-pay | Admitting: *Deleted

## 2018-10-22 DIAGNOSIS — F341 Dysthymic disorder: Secondary | ICD-10-CM

## 2018-10-22 DIAGNOSIS — F4010 Social phobia, unspecified: Secondary | ICD-10-CM | POA: Diagnosis not present

## 2018-10-23 ENCOUNTER — Encounter: Payer: Self-pay | Admitting: Internal Medicine

## 2018-10-23 ENCOUNTER — Ambulatory Visit (INDEPENDENT_AMBULATORY_CARE_PROVIDER_SITE_OTHER): Payer: BLUE CROSS/BLUE SHIELD | Admitting: Internal Medicine

## 2018-10-23 ENCOUNTER — Other Ambulatory Visit: Payer: Self-pay

## 2018-10-23 VITALS — Ht 63.0 in | Wt 150.0 lb

## 2018-10-23 DIAGNOSIS — K51011 Ulcerative (chronic) pancolitis with rectal bleeding: Secondary | ICD-10-CM | POA: Diagnosis not present

## 2018-10-23 DIAGNOSIS — R933 Abnormal findings on diagnostic imaging of other parts of digestive tract: Secondary | ICD-10-CM | POA: Diagnosis not present

## 2018-10-23 DIAGNOSIS — D500 Iron deficiency anemia secondary to blood loss (chronic): Secondary | ICD-10-CM

## 2018-10-23 DIAGNOSIS — R1084 Generalized abdominal pain: Secondary | ICD-10-CM

## 2018-10-23 DIAGNOSIS — K625 Hemorrhage of anus and rectum: Secondary | ICD-10-CM | POA: Diagnosis not present

## 2018-10-23 NOTE — Progress Notes (Signed)
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  She was well until early March when she developed problems with abdominal pain and bloody diarrhea which persisted and worsened over time.  She underwent CT scan on 2 occasions in March (reviewed) which showed pancolitis.  She was hospitalized September 29, 2018.  Stool studies were negative.  She subsequently underwent limited colonoscopy (left side) with Dr. Loletha Carrow October 01, 2018.  She was found to have severe diffuse left-sided colitis consistent with ulcerative colitis.  Biopsies were consistent with the same.  She was discharged home on 40 mg of prednisone daily October 03, 2018.  Patient contact the office approximate 1 week later with complaints of abdominal pain.  At that time, I placed her on Lialda 4.8 g daily.  She has been on this for approximately 2 weeks.  She was on prednisone 40 mg daily for 1 week and has been on prednisone 30 mg daily for approximately 2 weeks.  No topical therapy.  Currently using Bentyl for abdominal cramping without significant effect.  She states that she is approximately 40% better.  She is able to eat much easier and is no longer experiencing nausea or vomiting.  She does not feel dehydrated.  She continues with intermittent loose bloody stools with some abdominal discomfort and some nocturnal symptoms.  No fevers or other problems.  She is tolerating her medications well.  Discharge hemoglobin was 10.2.  REVIEW OF SYSTEMS:  All non-GI ROS negative unless otherwise stated in the HPI except for fatigue  Past Medical History:  Diagnosis Date  . Ankle sprain   . Anxiety   . Depression    denies hospitalization or medication  . Hyponatremia   . Ulcerative colitis [facility])     Past Surgical History:  Procedure Laterality Date  . BIOPSY  10/01/2018   Procedure: BIOPSY;  Surgeon: Doran Stabler, MD;   Location: [place];  Service: Endoscopy;;  . COLONOSCOPY WITH PROPOFOL N/A 10/01/2018   Procedure: COLONOSCOPY WITH PROPOFOL;  Surgeon: Doran Stabler, MD;  Location: [place];  Service: Endoscopy;  Laterality: N/A;  . WISDOM TOOTH EXTRACTION      Social History Nitara Szczerba  reports that she has never smoked. She has never used smokeless tobacco. She reports that she does not drink alcohol or use drugs.  family history includes Diabetes in her mother; Heart disease in her maternal aunt; Hypertension in her father.  No Known Allergies     PHYSICAL EXAMINATION: No physical exam with telemedicine visit    ASSESSMENT:  1.  Universal ulcerative colitis, severe.  Moderate improvement on prednisone and Lialda. 2.  Acute and chronic blood loss anemia  PLAN:  1.  PRESCRIBE prednisone 20 mg; #60; 2 refills.  I have instructed the patient to increase her prednisone to 40 mg daily for 2 weeks then 30 mg daily until her follow-up visit in 4 weeks. 2.  Continue Lialda 4.8 g daily.  She has plenty of medicine 3.  PRESCRIBE Rowasa enema (or the equivalent); #30; 2 refills; 1 PR nightly 4.  OFFICE FOLLOW-up via telemedicine WebEx visit in 4 weeks 5.  Continue Bentyl as needed for abdominal cramping 6.  Patient had questions regarding diet.  No dietary restrictions 7.  Long discussion today regarding the pathophysiology of ulcerative colitis and the rationale behind various treatment strategies.  We discussed the  goals of clinical and histologic remission.  I referred her to the Crohn's and colitis foundation of [place] website for additional information as well as our office website 8.  Contact the office in the interim for any questions or problems  This telemedicine visit was both audio and visual.  The visit was requested by the patient and patient provided consent.  The patient was in her home and I was in my office.  She understands her may be associated professional charges for  this service.  There was 35 minutes of median intra-service time and 55 minutes of total time

## 2018-10-24 MED ORDER — PREDNISONE 20 MG PO TABS: ORAL_TABLET | ORAL | 2 refills | Status: DC

## 2018-10-24 MED ORDER — MESALAMINE 4 G RE ENEM: 4.0000 g | ENEMA | Freq: Every day | RECTAL | 2 refills | Status: DC

## 2018-10-24 NOTE — Patient Instructions (Addendum)
We have sent the following medications to your pharmacy for you to pick up at your convenience: Prednisone 40 mg daily x 14 days, then decrease to 30 mg daily until your follow up with Dr Henrene Pastor.  Rowasa enemas-1 per rectum every night  Continue Bentyl as needed for abdominal cramping.  Continue Lialda 4.8 grams daily.  You currently have no dietary restrictions.  The Crohn's and [place] website has some good information that would be beneficial for you to look over.  Please follow up with Dr Henrene Pastor for a [facility] visit on 11/29/2018 at 10:00 am. You should receive a link via email. If you do not, please let us know.  Call our office should you have any questions or concerns between now and your follow up visit.

## 2018-10-24 NOTE — Addendum Note (Signed)
Addended by: Larina Bras on: 10/24/2018 12:26 PM   Modules accepted: Orders

## 2018-10-26 ENCOUNTER — Telehealth: Payer: Self-pay | Admitting: Internal Medicine

## 2018-10-26 ENCOUNTER — Other Ambulatory Visit: Payer: BLUE CROSS/BLUE SHIELD

## 2018-10-26 ENCOUNTER — Other Ambulatory Visit: Payer: Self-pay

## 2018-10-26 DIAGNOSIS — R110 Nausea: Secondary | ICD-10-CM

## 2018-10-26 MED ORDER — OMEPRAZOLE 20 MG PO CPDR: 20.0000 mg | DELAYED_RELEASE_CAPSULE | Freq: Two times a day (BID) | ORAL | 1 refills | Status: DC

## 2018-10-26 MED ORDER — PROMETHAZINE HCL 25 MG PO TABS: 25.0000 mg | ORAL_TABLET | Freq: Four times a day (QID) | ORAL | 1 refills | Status: DC | PRN

## 2018-10-26 NOTE — Telephone Encounter (Signed)
Scripts sent to pharmacy, lab orders in epic. Pt is aware. Follow-up OV already scheduled.

## 2018-10-26 NOTE — Telephone Encounter (Signed)
Called patient this AM. She endorses nausea for the last 3 days or so. No emesis. Lasts through the day. Only limited PO intake 2/2 sxs. Has taken a few doses of Zofran and Compazine without any improvement. Fever of 102 last night, but afebrile. No sick contacts or recent travel. Still with loose stools attributed to recently-diagnosed UC, with 6 BM/day including 1-2 nocturnal stools, which is unchanged. Improvement in hematochezia and abd pain with steroids though.  Discussed DDX with patient at length, to include infectious etiology, steroid related gastritis, UGI IBD, medication related nausea, etc. and will evaluate and treat as below:  - Check GI PCR path panel, C. difficile - Check H pylori serology - Start Prilosec 20 mg p.o. twice daily #180, refill 1 - Phenergan 25 mg every 6 hours as needed for nausea.. #30, RF 1.  Recommended that she start with first dose at night due to drowsiness ADR.  Reviewed medication ADR profile. -Resume IBD medications as prescribed for now -Given current COVID-19 pandemic, recommended that she stay isolated for now (which she has been doing) due to her own immunosuppression as well as literature regarding GI manifestations of COVID-19 - Follow-up with Dr. Henrene Pastor.  Can review her acute issues as well as responsiveness to steroids versus escalation of therapy -All questions answered and patient grateful for the phone call.

## 2018-10-26 NOTE — Telephone Encounter (Signed)
Patient called said that after her virtual visit on 4-14 she has been feeling nausea and not been able to eat. Would like to speak to the nurse.

## 2018-10-26 NOTE — Telephone Encounter (Signed)
Monica Ortega pt with [place], phone visit 4/14. Pt states that on Tuesday she started having nausea, she has not vomited. Reports she has a lot of saliva and some upper abd pain with the nausea. Pt has zofran 42m that she has been taking every 6 hours. Reports this is not helping. As DOD please advise.

## 2018-10-29 ENCOUNTER — Other Ambulatory Visit: Payer: BLUE CROSS/BLUE SHIELD

## 2018-10-29 ENCOUNTER — Telehealth: Payer: Self-pay | Admitting: Internal Medicine

## 2018-10-29 NOTE — Telephone Encounter (Signed)
Pt's sister [name] calling to inform that medications that pt was put on for uc are not working, she stated that pt has been experiencing nausea and adb pain.

## 2018-10-29 NOTE — Telephone Encounter (Signed)
Patient reports nausea, decreased appetite, upper and lower abd pain and discomfort. Also reported she tried but was unable to complete her "enema treatment" due to pain. Patient scheduled for zoom visit with Dr. Henrene Pastor on 10/30/18 at 8:30am.

## 2018-10-30 ENCOUNTER — Ambulatory Visit: Payer: Self-pay | Admitting: Internal Medicine

## 2018-10-30 ENCOUNTER — Other Ambulatory Visit: Payer: Self-pay | Admitting: *Deleted

## 2018-10-30 ENCOUNTER — Encounter: Payer: Self-pay | Admitting: Internal Medicine

## 2018-10-30 ENCOUNTER — Other Ambulatory Visit: Payer: Self-pay

## 2018-10-30 ENCOUNTER — Ambulatory Visit (INDEPENDENT_AMBULATORY_CARE_PROVIDER_SITE_OTHER): Payer: BLUE CROSS/BLUE SHIELD | Admitting: Internal Medicine

## 2018-10-30 ENCOUNTER — Other Ambulatory Visit (INDEPENDENT_AMBULATORY_CARE_PROVIDER_SITE_OTHER): Payer: BLUE CROSS/BLUE SHIELD

## 2018-10-30 VITALS — Ht 63.0 in | Wt 152.0 lb

## 2018-10-30 DIAGNOSIS — R1084 Generalized abdominal pain: Secondary | ICD-10-CM | POA: Diagnosis not present

## 2018-10-30 DIAGNOSIS — K51011 Ulcerative (chronic) pancolitis with rectal bleeding: Secondary | ICD-10-CM | POA: Diagnosis not present

## 2018-10-30 DIAGNOSIS — K625 Hemorrhage of anus and rectum: Secondary | ICD-10-CM | POA: Diagnosis not present

## 2018-10-30 DIAGNOSIS — R110 Nausea: Secondary | ICD-10-CM

## 2018-10-30 LAB — COMPREHENSIVE METABOLIC PANEL
ALT: 19 U/L (ref 0–35)
AST: 16 U/L (ref 0–37)
Albumin: 3.1 g/dL — ABNORMAL LOW (ref 3.5–5.2)
Alkaline Phosphatase: 70 U/L (ref 39–117)
BUN: 8 mg/dL (ref 6–23)
CO2: 29 mEq/L (ref 19–32)
Calcium: 8.6 mg/dL (ref 8.4–10.5)
Chloride: 98 mEq/L (ref 96–112)
Creatinine, Ser: 0.7 mg/dL (ref 0.40–1.20)
GFR: 98.05 mL/min (ref >60.00)
Glucose, Bld: 166 mg/dL — ABNORMAL HIGH (ref 70–99)
Potassium: 3.6 mEq/L (ref 3.5–5.1)
Sodium: 134 mEq/L — ABNORMAL LOW (ref 135–145)
Total Bilirubin: 0.3 mg/dL (ref 0.2–1.2)
Total Protein: 6.3 g/dL (ref 6.0–8.3)

## 2018-10-30 LAB — CBC WITH DIFFERENTIAL/PLATELET
Basophils Absolute: 0 10*3/uL (ref 0.0–0.1)
Basophils Relative: 0 % (ref 0.0–3.0)
Eosinophils Absolute: 0 10*3/uL (ref 0.0–0.7)
Eosinophils Relative: 0.4 % (ref 0.0–5.0)
HCT: 30.1 % — ABNORMAL LOW (ref 36.0–46.0)
Hemoglobin: 9.9 g/dL — ABNORMAL LOW (ref 12.0–15.0)
Lymphocytes Relative: 6 % — ABNORMAL LOW (ref 12.0–46.0)
Lymphs Abs: 0.6 10*3/uL — ABNORMAL LOW (ref 0.7–4.0)
MCHC: 33 g/dL (ref 30.0–36.0)
MCV: 79.9 fl (ref 78.0–100.0)
Monocytes Absolute: 0.2 10*3/uL (ref 0.1–1.0)
Monocytes Relative: 2.4 % — ABNORMAL LOW (ref 3.0–12.0)
Neutro Abs: 9.5 10*3/uL — ABNORMAL HIGH (ref 1.4–7.7)
Neutrophils Relative %: 91.2 % — ABNORMAL HIGH (ref 43.0–77.0)
Platelets: 268 10*3/uL (ref 150.0–400.0)
RBC: 3.76 Mil/uL — ABNORMAL LOW (ref 3.87–5.11)
RDW: 13.6 % (ref 11.5–15.5)
WBC: 10.4 10*3/uL (ref 4.0–10.5)

## 2018-10-30 LAB — GASTROINTESTINAL PATHOGEN PANEL PCR
C. difficile Tox A/B, PCR: NOT DETECTED
Campylobacter, PCR: NOT DETECTED
Cryptosporidium, PCR: NOT DETECTED
E coli (ETEC) LT/ST PCR: NOT DETECTED
E coli (STEC) stx1/stx2, PCR: NOT DETECTED
E coli 0157, PCR: NOT DETECTED
Giardia lamblia, PCR: NOT DETECTED
Norovirus, PCR: NOT DETECTED
Rotavirus A, PCR: NOT DETECTED
Salmonella, PCR: NOT DETECTED
Shigella, PCR: NOT DETECTED

## 2018-10-30 LAB — SPECIMEN STATUS REPORT

## 2018-10-30 LAB — H PYLORI, IGM, IGG, IGA AB
H pylori, IgM Abs: <9 units (ref 0.0–8.9)
H. pylori, IgA Abs: <9 units (ref 0.0–8.9)
H. pylori, IgG AbS: 0.14 Index Value (ref 0.00–0.79)

## 2018-10-30 NOTE — Progress Notes (Signed)
HISTORY OF PRESENT ILLNESS:  Monica Ortega is a 28 y.o. female with universal ulcerative colitis diagnosed on colonoscopy October 01, 2018 after being ill for several weeks.  She was initially placed on prednisone monotherapy.  I saw the patient via WebEx A/V telemedicine visit October 23, 2018.  See that dictation for details.  At that time the patient reported being "40% better".  She was on 30 mg of prednisone and had been on Lialda 4.8 g for approximately 2 weeks.  No worrisome features at that time but she was felt to have had ongoing flare of disease with modest improvement on medical therapy.  Her prednisone was increased to 40 mg daily, Lialda continued at 4.8 g daily, Rowasa enema at night prescribed, Bentyl for abdominal cramping, and routine follow-up in 1 month recommended.  Patient contacted the office a few days later with chief complaints of postprandial pain and nausea without vomiting.  She states Zofran did not help.  She was prescribed omeprazole daily.  She was also prescribed Phenergan.  Though she tells me that these entities did not help, she also tells me that she has had no significant nausea and required no medication for nausea in the past 3 to 4 days.  She describes her abdominal pain after meals as epigastric.  She also describes belching.  She states that the pain is slowly improving.  She continues with about 6 loose or soft bowel movements per day.  Typically has blood.  She will awaken at night with the need to defecate.  Since last week she would describe her situation as the same or slightly worse.  She did describe an isolated fever Friday evening.  None prior and none since.  She feels like she is taking adequate food and liquid by mouth and does not feel dehydrated.  She came in for blood work, only Helicobacter pylori antibody which was negative.  Stool studies were not processed due to technical issue with ordering.  She has been able to take enema therapy last 3 or 4 evenings  and for the most part is able to hold the enemas for 20 or 30 minutes.  REVIEW OF SYSTEMS:  All non-GI ROS negative unless otherwise stated in the HPI except for fatigue  Past Medical History:  Diagnosis Date  . Ankle sprain   . Anxiety   . Depression    denies hospitalization or medication  . Hyponatremia   . Ulcerative colitis [facility])     Past Surgical History:  Procedure Laterality Date  . BIOPSY  10/01/2018   Procedure: BIOPSY;  Surgeon: Doran Stabler, MD;  Location: [place];  Service: Endoscopy;;  . COLONOSCOPY WITH PROPOFOL N/A 10/01/2018   Procedure: COLONOSCOPY WITH PROPOFOL;  Surgeon: Doran Stabler, MD;  Location: [place];  Service: Endoscopy;  Laterality: N/A;  . WISDOM TOOTH EXTRACTION      Social History Anaeli Cornwall  reports that she has never smoked. She has never used smokeless tobacco. She reports that she does not drink alcohol or use drugs.  family history includes Diabetes in her mother; Heart disease in her maternal aunt; Hypertension in her father.  No Known Allergies     PHYSICAL EXAMINATION: No physical examination with telemedicine audiovisual visit    ASSESSMENT:  1.  Universal ulcerative colitis.  Ongoing symptoms as outlined above despite medical therapies to date.  I am concerned that she is not responding as well as hoped for to this point.  We discussed  options at this point including hospitalization for refractory disease, though we would like to avoid this if possible.  We discussed escalation of conventional therapies as well as the potential need for relook endoscopy with biopsies and consideration of biologic therapy (likely inpatient).  We also discussed the role of surgery.  She is in a higher risk group for needing potential colectomy being young, female, and having abrupt onset of disease. 2.  Isolated fever 4 evenings ago.  No recurrence.  Needs watch closely for recurrence   PLAN:  1.  CBC with differential,  comprehensive metabolic panel TODAY 2.  Stool for C. difficile Today 3.  Schedule contrast-enhanced CT scan of the abdomen pelvis TODAY if possible "persistent ulcerative colitis, abdominal pain, evaluate" 4.  Increase prednisone to 60 mg daily, for now 5.  Continue omeprazole recently prescribed daily 6.  Continue Lialda and Rowasa enemas 7.  We will be in touch with the patient after the above results are available.  She has been strictly instructed to contact the office in the interim or anytime thereafter for questions or problems  I have sent the above instructions and orders to my nurse Rosanne Sack for execution  This audio and visual WebEx visit was between the patient (at home in her bed) and myself (in my office) at her initiation and consent.  She understands there may be an associated professional charge for this service.

## 2018-10-30 NOTE — Progress Notes (Signed)
Scheduled CT of abd and pelvis at earliest option on Wed 4/22 at 8:30am. Spoke with patient and instructed patient to come in for labs today, CT contrast left up front for patient, instructions on how to take given to patient over the phone. Reminded patient to increase prednisone to 27m daily, and to continue omeprazole daily, Lialda and Rowasa enemas.

## 2018-10-31 ENCOUNTER — Inpatient Hospital Stay (HOSPITAL_COMMUNITY)
Admission: EM | Admit: 2018-10-31 | Discharge: 2018-11-03 | DRG: 253 | Disposition: A | Discharge status: Home / Self Care | Payer: BLUE CROSS/BLUE SHIELD | Source: Ambulatory Visit | Attending: Internal Medicine | Admitting: Internal Medicine

## 2018-10-31 ENCOUNTER — Other Ambulatory Visit: Payer: BLUE CROSS/BLUE SHIELD

## 2018-10-31 ENCOUNTER — Emergency Department (HOSPITAL_COMMUNITY): Payer: BLUE CROSS/BLUE SHIELD

## 2018-10-31 ENCOUNTER — Inpatient Hospital Stay (HOSPITAL_COMMUNITY): Payer: BLUE CROSS/BLUE SHIELD | Admitting: Anesthesiology

## 2018-10-31 ENCOUNTER — Other Ambulatory Visit: Payer: Self-pay

## 2018-10-31 ENCOUNTER — Ambulatory Visit: Payer: Self-pay | Admitting: Internal Medicine

## 2018-10-31 ENCOUNTER — Encounter (HOSPITAL_COMMUNITY): Payer: Self-pay | Admitting: *Deleted

## 2018-10-31 ENCOUNTER — Telehealth: Payer: Self-pay | Admitting: Internal Medicine

## 2018-10-31 ENCOUNTER — Encounter (HOSPITAL_COMMUNITY)
Admission: EM | Disposition: A | Discharge status: Home / Self Care | Payer: Self-pay | Source: Ambulatory Visit | Attending: Internal Medicine

## 2018-10-31 ENCOUNTER — Ambulatory Visit (INDEPENDENT_AMBULATORY_CARE_PROVIDER_SITE_OTHER)
Admission: RE | Admit: 2018-10-31 | Discharge: 2018-10-31 | Disposition: A | Discharge status: Home / Self Care | Payer: BLUE CROSS/BLUE SHIELD | Source: Ambulatory Visit | Attending: Internal Medicine | Admitting: Internal Medicine

## 2018-10-31 DIAGNOSIS — Z833 Family history of diabetes mellitus: Secondary | ICD-10-CM | POA: Diagnosis not present

## 2018-10-31 DIAGNOSIS — E0965 Drug or chemical induced diabetes mellitus with hyperglycemia: Secondary | ICD-10-CM | POA: Diagnosis present

## 2018-10-31 DIAGNOSIS — K51011 Ulcerative (chronic) pancolitis with rectal bleeding: Secondary | ICD-10-CM

## 2018-10-31 DIAGNOSIS — O2230 Deep phlebothrombosis in pregnancy, unspecified trimester: Secondary | ICD-10-CM

## 2018-10-31 DIAGNOSIS — K625 Hemorrhage of anus and rectum: Secondary | ICD-10-CM

## 2018-10-31 DIAGNOSIS — K51812 Other ulcerative colitis with intestinal obstruction: Secondary | ICD-10-CM | POA: Diagnosis present

## 2018-10-31 DIAGNOSIS — Z8249 Family history of ischemic heart disease and other diseases of the circulatory system: Secondary | ICD-10-CM | POA: Diagnosis not present

## 2018-10-31 DIAGNOSIS — D649 Anemia, unspecified: Secondary | ICD-10-CM | POA: Diagnosis not present

## 2018-10-31 DIAGNOSIS — I82442 Acute embolism and thrombosis of left tibial vein: Secondary | ICD-10-CM | POA: Diagnosis not present

## 2018-10-31 DIAGNOSIS — I82409 Acute embolism and thrombosis of unspecified deep veins of unspecified lower extremity: Secondary | ICD-10-CM

## 2018-10-31 DIAGNOSIS — Z6826 Body mass index (BMI) 26.0-26.9, adult: Secondary | ICD-10-CM

## 2018-10-31 DIAGNOSIS — Z7952 Long term (current) use of systemic steroids: Secondary | ICD-10-CM | POA: Diagnosis not present

## 2018-10-31 DIAGNOSIS — Z79899 Other long term (current) drug therapy: Secondary | ICD-10-CM | POA: Diagnosis not present

## 2018-10-31 DIAGNOSIS — F332 Major depressive disorder, recurrent severe without psychotic features: Secondary | ICD-10-CM | POA: Diagnosis present

## 2018-10-31 DIAGNOSIS — D500 Iron deficiency anemia secondary to blood loss (chronic): Secondary | ICD-10-CM | POA: Diagnosis not present

## 2018-10-31 DIAGNOSIS — I82419 Acute embolism and thrombosis of unspecified femoral vein: Secondary | ICD-10-CM | POA: Diagnosis present

## 2018-10-31 DIAGNOSIS — Z538 Procedure and treatment not carried out for other reasons: Secondary | ICD-10-CM | POA: Diagnosis present

## 2018-10-31 DIAGNOSIS — E440 Moderate protein-calorie malnutrition: Secondary | ICD-10-CM | POA: Diagnosis present

## 2018-10-31 DIAGNOSIS — R509 Fever, unspecified: Secondary | ICD-10-CM | POA: Diagnosis not present

## 2018-10-31 DIAGNOSIS — T380X5A Adverse effect of glucocorticoids and synthetic analogues, initial encounter: Secondary | ICD-10-CM | POA: Diagnosis present

## 2018-10-31 DIAGNOSIS — K51919 Ulcerative colitis, unspecified with unspecified complications: Secondary | ICD-10-CM | POA: Diagnosis not present

## 2018-10-31 DIAGNOSIS — F419 Anxiety disorder, unspecified: Secondary | ICD-10-CM | POA: Diagnosis present

## 2018-10-31 DIAGNOSIS — D72825 Bandemia: Secondary | ICD-10-CM | POA: Diagnosis present

## 2018-10-31 DIAGNOSIS — I82451 Acute embolism and thrombosis of right peroneal vein: Secondary | ICD-10-CM | POA: Diagnosis not present

## 2018-10-31 DIAGNOSIS — K529 Noninfective gastroenteritis and colitis, unspecified: Secondary | ICD-10-CM | POA: Diagnosis not present

## 2018-10-31 DIAGNOSIS — D62 Acute posthemorrhagic anemia: Secondary | ICD-10-CM | POA: Diagnosis present

## 2018-10-31 DIAGNOSIS — K5190 Ulcerative colitis, unspecified, without complications: Secondary | ICD-10-CM | POA: Diagnosis not present

## 2018-10-31 DIAGNOSIS — I82422 Acute embolism and thrombosis of left iliac vein: Secondary | ICD-10-CM | POA: Diagnosis not present

## 2018-10-31 DIAGNOSIS — I82401 Acute embolism and thrombosis of unspecified deep veins of right lower extremity: Secondary | ICD-10-CM | POA: Diagnosis present

## 2018-10-31 DIAGNOSIS — E871 Hypo-osmolality and hyponatremia: Secondary | ICD-10-CM | POA: Diagnosis present

## 2018-10-31 DIAGNOSIS — K3184 Gastroparesis: Secondary | ICD-10-CM | POA: Diagnosis present

## 2018-10-31 DIAGNOSIS — I82423 Acute embolism and thrombosis of iliac vein, bilateral: Secondary | ICD-10-CM | POA: Diagnosis not present

## 2018-10-31 DIAGNOSIS — E876 Hypokalemia: Secondary | ICD-10-CM | POA: Diagnosis not present

## 2018-10-31 DIAGNOSIS — E860 Dehydration: Secondary | ICD-10-CM | POA: Diagnosis not present

## 2018-10-31 DIAGNOSIS — R7982 Elevated C-reactive protein (CRP): Secondary | ICD-10-CM | POA: Diagnosis present

## 2018-10-31 DIAGNOSIS — D638 Anemia in other chronic diseases classified elsewhere: Secondary | ICD-10-CM | POA: Diagnosis present

## 2018-10-31 HISTORY — PX: VENA CAVA FILTER PLACEMENT: SHX1085

## 2018-10-31 HISTORY — DX: Ulcerative (chronic) pancolitis with rectal bleeding: K51.011

## 2018-10-31 HISTORY — DX: Hemorrhage of anus and rectum: K62.5

## 2018-10-31 HISTORY — DX: Acute embolism and thrombosis of unspecified deep veins of unspecified lower extremity: I82.409

## 2018-10-31 LAB — CBC WITH DIFFERENTIAL/PLATELET
Basophils Absolute: 0.1 10*3/uL (ref 0.0–0.1)
Basophils Relative: 0 %
Eosinophils Absolute: 0.1 10*3/uL (ref 0.0–0.5)
Eosinophils Relative: 1 %
HCT: 30.6 % — ABNORMAL LOW (ref 36.0–46.0)
Hemoglobin: 9.3 g/dL — ABNORMAL LOW (ref 12.0–15.0)
Lymphocytes Relative: 17 %
Lymphs Abs: 1.5 10*3/uL (ref 0.7–4.0)
MCH: 25.8 pg — ABNORMAL LOW (ref 26.0–34.0)
MCHC: 30.4 g/dL (ref 30.0–36.0)
MCV: 85 fL (ref 80.0–100.0)
Metamyelocytes Relative: 4 %
Monocytes Absolute: 0.5 10*3/uL (ref 0.1–1.0)
Monocytes Relative: 5 %
Neutro Abs: 6.5 10*3/uL (ref 1.7–7.7)
Neutrophils Relative %: 73 %
Platelets: 241 10*3/uL (ref 150–400)
RBC: 3.6 MIL/uL — ABNORMAL LOW (ref 3.87–5.11)
RDW: 13.4 % (ref 11.5–15.5)
WBC Morphology: INCREASED
WBC: 8.9 10*3/uL (ref 4.0–10.5)
nRBC: 0.4 % — ABNORMAL HIGH (ref 0.0–0.2)

## 2018-10-31 LAB — CBC
HCT: 25.9 % — ABNORMAL LOW (ref 36.0–46.0)
Hemoglobin: 8.1 g/dL — ABNORMAL LOW (ref 12.0–15.0)
MCH: 26.2 pg (ref 26.0–34.0)
MCHC: 31.3 g/dL (ref 30.0–36.0)
MCV: 83.8 fL (ref 80.0–100.0)
Platelets: 203 10*3/uL (ref 150–400)
RBC: 3.09 MIL/uL — ABNORMAL LOW (ref 3.87–5.11)
RDW: 13.7 % (ref 11.5–15.5)
WBC: 8.3 10*3/uL (ref 4.0–10.5)
nRBC: 0.2 % (ref 0.0–0.2)

## 2018-10-31 LAB — URINALYSIS, ROUTINE W REFLEX MICROSCOPIC
Bilirubin Urine: NEGATIVE
Glucose, UA: NEGATIVE mg/dL
Hgb urine dipstick: NEGATIVE
Ketones, ur: NEGATIVE mg/dL
Leukocytes,Ua: NEGATIVE
Nitrite: NEGATIVE
Protein, ur: NEGATIVE mg/dL
Specific Gravity, Urine: >1.046 — ABNORMAL HIGH (ref 1.005–1.030)
pH: 5 (ref 5.0–8.0)

## 2018-10-31 LAB — COMPREHENSIVE METABOLIC PANEL
ALT: 21 U/L (ref 0–44)
AST: 16 U/L (ref 15–41)
Albumin: 2.4 g/dL — ABNORMAL LOW (ref 3.5–5.0)
Alkaline Phosphatase: 62 U/L (ref 38–126)
Anion gap: 12 (ref 5–15)
BUN: 6 mg/dL (ref 6–20)
CO2: 24 mmol/L (ref 22–32)
Calcium: 8.3 mg/dL — ABNORMAL LOW (ref 8.9–10.3)
Chloride: 96 mmol/L — ABNORMAL LOW (ref 98–111)
Creatinine, Ser: 0.85 mg/dL (ref 0.44–1.00)
GFR calc Af Amer: >60 mL/min (ref >60)
GFR calc non Af Amer: >60 mL/min (ref >60)
Glucose, Bld: 150 mg/dL — ABNORMAL HIGH (ref 70–99)
Potassium: 3.1 mmol/L — ABNORMAL LOW (ref 3.5–5.1)
Sodium: 132 mmol/L — ABNORMAL LOW (ref 135–145)
Total Bilirubin: 0.4 mg/dL (ref 0.3–1.2)
Total Protein: 5.8 g/dL — ABNORMAL LOW (ref 6.5–8.1)

## 2018-10-31 LAB — I-STAT BETA HCG BLOOD, ED (MC, WL, AP ONLY): I-stat hCG, quantitative: <5 m[IU]/mL (ref <5)

## 2018-10-31 LAB — SURGICAL PCR SCREEN
MRSA, PCR: NEGATIVE
Staphylococcus aureus: NEGATIVE

## 2018-10-31 LAB — LIPASE, BLOOD: Lipase: 63 U/L — ABNORMAL HIGH (ref 11–51)

## 2018-10-31 SURGERY — INSERTION, FILTER, VENA CAVA
Anesthesia: Monitor Anesthesia Care

## 2018-10-31 MED ORDER — ACETAMINOPHEN 500 MG PO TABS: 1000.0000 mg | ORAL_TABLET | Freq: Four times a day (QID) | ORAL | Status: DC | PRN

## 2018-10-31 MED ORDER — PROMETHAZINE HCL 25 MG PO TABS: 25.0000 mg | ORAL_TABLET | Freq: Four times a day (QID) | ORAL | Status: DC | PRN

## 2018-10-31 MED ORDER — SODIUM CHLORIDE 0.9 % IV SOLN
INTRAVENOUS | Status: AC
  Filled 2018-10-31: qty 1.2

## 2018-10-31 MED ORDER — METHYLPREDNISOLONE SODIUM SUCC 40 MG IJ SOLR
40.0000 mg | Freq: Two times a day (BID) | INTRAMUSCULAR | Status: DC
  Administered 2018-10-31 – 2018-11-02 (×4): 40 mg via INTRAVENOUS
  Filled 2018-10-31 (×4): qty 1

## 2018-10-31 MED ORDER — PROPOFOL 500 MG/50ML IV EMUL
INTRAVENOUS | Status: DC | PRN
  Administered 2018-10-31: 50 ug/kg/min via INTRAVENOUS

## 2018-10-31 MED ORDER — MUPIROCIN 2 % EX OINT
1.0000 "application " | TOPICAL_OINTMENT | Freq: Once | CUTANEOUS | Status: AC
  Administered 2018-10-31: 14:00:00 1 via TOPICAL

## 2018-10-31 MED ORDER — IOHEXOL 300 MG/ML  SOLN
100.0000 mL | Freq: Once | INTRAMUSCULAR | Status: AC | PRN
  Administered 2018-10-31: 100 mL via INTRAVENOUS

## 2018-10-31 MED ORDER — METHYLPREDNISOLONE SODIUM SUCC 40 MG IJ SOLR: 30.0000 mg | Freq: Two times a day (BID) | INTRAMUSCULAR | Status: DC

## 2018-10-31 MED ORDER — LIDOCAINE-EPINEPHRINE 0.5 %-1:200000 IJ SOLN
INTRAMUSCULAR | Status: AC
  Filled 2018-10-31: qty 1

## 2018-10-31 MED ORDER — LACTATED RINGERS IV SOLN
INTRAVENOUS | Status: DC | PRN
  Administered 2018-10-31: 13:00:00 via INTRAVENOUS

## 2018-10-31 MED ORDER — ONDANSETRON HCL 4 MG/2ML IJ SOLN
INTRAMUSCULAR | Status: DC | PRN
  Administered 2018-10-31: 4 mg via INTRAVENOUS

## 2018-10-31 MED ORDER — SODIUM CHLORIDE 0.9 % IV SOLN
INTRAVENOUS | Status: DC | PRN
  Administered 2018-10-31: 15:00:00

## 2018-10-31 MED ORDER — MEPERIDINE HCL 50 MG/ML IJ SOLN: 6.2500 mg | INTRAMUSCULAR | Status: DC | PRN

## 2018-10-31 MED ORDER — MORPHINE SULFATE (PF) 2 MG/ML IV SOLN
2.0000 mg | INTRAVENOUS | Status: DC | PRN
  Administered 2018-10-31: 19:00:00 2 mg via INTRAVENOUS
  Filled 2018-10-31: qty 1

## 2018-10-31 MED ORDER — ONDANSETRON HCL 4 MG/2ML IJ SOLN: 4.0000 mg | Freq: Four times a day (QID) | INTRAMUSCULAR | Status: DC | PRN

## 2018-10-31 MED ORDER — TRAMADOL HCL 50 MG PO TABS
50.0000 mg | ORAL_TABLET | Freq: Four times a day (QID) | ORAL | Status: DC | PRN
  Administered 2018-10-31: 50 mg via ORAL
  Filled 2018-10-31: qty 1

## 2018-10-31 MED ORDER — IODIXANOL 320 MG/ML IV SOLN
INTRAVENOUS | Status: DC | PRN
  Administered 2018-10-31: 50 mL via INTRAVENOUS

## 2018-10-31 MED ORDER — DICYCLOMINE HCL 10 MG PO CAPS: 10.0000 mg | ORAL_CAPSULE | Freq: Four times a day (QID) | ORAL | Status: DC | PRN

## 2018-10-31 MED ORDER — MIDAZOLAM HCL 5 MG/5ML IJ SOLN
INTRAMUSCULAR | Status: DC | PRN
  Administered 2018-10-31: 2 mg via INTRAVENOUS

## 2018-10-31 MED ORDER — PANTOPRAZOLE SODIUM 40 MG IV SOLR
40.0000 mg | Freq: Two times a day (BID) | INTRAVENOUS | Status: DC
  Administered 2018-10-31 – 2018-11-02 (×4): 40 mg via INTRAVENOUS
  Filled 2018-10-31 (×4): qty 40

## 2018-10-31 MED ORDER — METOCLOPRAMIDE HCL 5 MG/ML IJ SOLN
10.0000 mg | Freq: Once | INTRAMUSCULAR | Status: AC
  Administered 2018-11-01: 10 mg via INTRAVENOUS
  Filled 2018-10-31: qty 2

## 2018-10-31 MED ORDER — 0.9 % SODIUM CHLORIDE (POUR BTL) OPTIME
TOPICAL | Status: DC | PRN
  Administered 2018-10-31: 1000 mL

## 2018-10-31 MED ORDER — ONDANSETRON HCL 4 MG PO TABS: 4.0000 mg | ORAL_TABLET | Freq: Four times a day (QID) | ORAL | Status: DC | PRN

## 2018-10-31 MED ORDER — MESALAMINE 1.2 G PO TBEC
4.8000 g | DELAYED_RELEASE_TABLET | Freq: Every day | ORAL | Status: DC
  Administered 2018-11-01 – 2018-11-03 (×3): 4.8 g via ORAL
  Filled 2018-10-31 (×4): qty 4

## 2018-10-31 MED ORDER — FENTANYL CITRATE (PF) 100 MCG/2ML IJ SOLN
INTRAMUSCULAR | Status: DC | PRN
  Administered 2018-10-31: 50 ug via INTRAVENOUS

## 2018-10-31 MED ORDER — FENTANYL CITRATE (PF) 250 MCG/5ML IJ SOLN
INTRAMUSCULAR | Status: AC
  Filled 2018-10-31: qty 5

## 2018-10-31 MED ORDER — SODIUM CHLORIDE 0.9 % IV BOLUS
1000.0000 mL | Freq: Once | INTRAVENOUS | Status: AC
  Administered 2018-10-31: 1000 mL via INTRAVENOUS

## 2018-10-31 MED ORDER — MIDAZOLAM HCL 2 MG/2ML IJ SOLN
INTRAMUSCULAR | Status: AC
  Filled 2018-10-31: qty 2

## 2018-10-31 MED ORDER — CEFAZOLIN SODIUM-DEXTROSE 2-4 GM/100ML-% IV SOLN
INTRAVENOUS | Status: AC
  Filled 2018-10-31: qty 100

## 2018-10-31 MED ORDER — PANTOPRAZOLE SODIUM 40 MG PO TBEC: 40.0000 mg | DELAYED_RELEASE_TABLET | Freq: Every day | ORAL | Status: DC

## 2018-10-31 MED ORDER — POLYETHYLENE GLYCOL 3350 17 GM/SCOOP PO POWD
0.5000 | Freq: Once | ORAL | Status: AC
  Administered 2018-10-31: 127.5 g via ORAL
  Filled 2018-10-31: qty 255

## 2018-10-31 MED ORDER — HYDROCODONE-ACETAMINOPHEN 5-325 MG PO TABS
1.0000 | ORAL_TABLET | Freq: Four times a day (QID) | ORAL | Status: DC | PRN
  Administered 2018-11-02 – 2018-11-03 (×3): 1 via ORAL
  Filled 2018-10-31 (×3): qty 1

## 2018-10-31 MED ORDER — METOCLOPRAMIDE HCL 5 MG/ML IJ SOLN
10.0000 mg | Freq: Once | INTRAMUSCULAR | Status: AC
  Administered 2018-10-31: 10 mg via INTRAVENOUS
  Filled 2018-10-31: qty 2

## 2018-10-31 MED ORDER — LOPERAMIDE HCL 2 MG PO CAPS: 2.0000 mg | ORAL_CAPSULE | Freq: Every day | ORAL | Status: DC | PRN

## 2018-10-31 MED ORDER — MESALAMINE 4 G RE ENEM
4.0000 g | ENEMA | Freq: Every day | RECTAL | Status: DC
  Filled 2018-10-31: qty 60

## 2018-10-31 MED ORDER — LIDOCAINE-EPINEPHRINE 0.5 %-1:200000 IJ SOLN
INTRAMUSCULAR | Status: DC | PRN
  Administered 2018-10-31: 50 mL

## 2018-10-31 MED ORDER — MIDAZOLAM HCL 2 MG/2ML IJ SOLN: 0.5000 mg | Freq: Once | INTRAMUSCULAR | Status: DC | PRN

## 2018-10-31 MED ORDER — MUPIROCIN 2 % EX OINT
TOPICAL_OINTMENT | CUTANEOUS | Status: AC
  Administered 2018-10-31: 1 via TOPICAL
  Filled 2018-10-31: qty 22

## 2018-10-31 MED ORDER — PROMETHAZINE HCL 25 MG/ML IJ SOLN: 6.2500 mg | INTRAMUSCULAR | Status: DC | PRN

## 2018-10-31 MED ORDER — POLYETHYLENE GLYCOL 3350 17 GM/SCOOP PO POWD
0.5000 | Freq: Once | ORAL | Status: AC
  Administered 2018-11-01: 127.5 g via ORAL

## 2018-10-31 MED ORDER — SODIUM CHLORIDE 0.9 % IV SOLN
INTRAVENOUS | Status: DC
  Administered 2018-11-01 (×2): via INTRAVENOUS

## 2018-10-31 SURGICAL SUPPLY — 38 items
BAG BANDED W/RUBBER/TAPE 36X54 (MISCELLANEOUS) ×2 IMPLANT
BAG DECANTER FOR FLEXI CONT (MISCELLANEOUS) ×2 IMPLANT
BLADE SURG 11 STRL SS (BLADE) ×2 IMPLANT
COVER DOME SNAP 22 D (MISCELLANEOUS) ×2 IMPLANT
COVER PROBE W GEL 5X96 (DRAPES) ×4 IMPLANT
COVER WAND RF STERILE (DRAPES) ×2 IMPLANT
DERMABOND ADVANCED (GAUZE/BANDAGES/DRESSINGS) ×1
DERMABOND ADVANCED .7 DNX12 (GAUZE/BANDAGES/DRESSINGS) ×1 IMPLANT
DRAPE C-ARM 42X72 X-RAY (DRAPES) ×2 IMPLANT
DRAPE CHEST BREAST 15X10 FENES (DRAPES) ×2 IMPLANT
DRAPE LAPAROTOMY T 102X78X121 (DRAPES) ×4 IMPLANT
FILTER VC CELECT-FEMORAL (Filter) ×2 IMPLANT
GAUZE 4X4 16PLY RFD (DISPOSABLE) ×2 IMPLANT
GAUZE SPONGE 4X4 12PLY STRL (GAUZE/BANDAGES/DRESSINGS) IMPLANT
GLOVE SS BIOGEL STRL SZ 7.5 (GLOVE) ×1 IMPLANT
GLOVE SUPERSENSE BIOGEL SZ 7.5 (GLOVE) ×1
GOWN STRL REUS W/ TWL LRG LVL3 (GOWN DISPOSABLE) ×2 IMPLANT
GOWN STRL REUS W/TWL LRG LVL3 (GOWN DISPOSABLE) ×2
KIT BASIN OR (CUSTOM PROCEDURE TRAY) ×2 IMPLANT
KIT TURNOVER KIT B (KITS) IMPLANT
NEEDLE 22X1 1/2 (OR ONLY) (NEEDLE) IMPLANT
NEEDLE HYPO 25GX1X1/2 BEV (NEEDLE) ×2 IMPLANT
NEEDLE PERC 18GX7CM (NEEDLE) ×4 IMPLANT
NS IRRIG 1000ML POUR BTL (IV SOLUTION) ×2 IMPLANT
PACK SURGICAL SETUP 50X90 (CUSTOM PROCEDURE TRAY) ×2 IMPLANT
PAD ARMBOARD 7.5X6 YLW CONV (MISCELLANEOUS) ×4 IMPLANT
SET MICROPUNCTURE 5F STIFF (MISCELLANEOUS) ×2 IMPLANT
SPONGE LAP 18X18 RF (DISPOSABLE) IMPLANT
SUT ETHILON 3 0 PS 1 (SUTURE) ×2 IMPLANT
SUT VIC AB 4-0 PS2 18 (SUTURE) ×2 IMPLANT
SYR 20ML ECCENTRIC (SYRINGE) ×2 IMPLANT
SYR 30ML LL (SYRINGE) ×2 IMPLANT
SYR 5ML LL (SYRINGE) ×2 IMPLANT
SYR CONTROL 10ML LL (SYRINGE) ×2 IMPLANT
TOWEL GREEN STERILE (TOWEL DISPOSABLE) ×2 IMPLANT
WATER STERILE IRR 1000ML POUR (IV SOLUTION) ×2 IMPLANT
WIRE EMERALD 3MM-J .035X150CM (WIRE) ×2 IMPLANT
WIRE J 3MM .035X145CM (WIRE) ×2 IMPLANT

## 2018-10-31 NOTE — Consult Note (Addendum)
[facility] Consult: 6:00 PM 10/31/2018  LOS: 0 days    Referring Provider: ED physician,  Primary Care Physician:  [name], DO Primary Gastroenterologist:  Dr. Henrene Pastor   Reason for Consultation: Abdominal pain, bloody stools in patient with Crohn's disease.   HPI: Monica Ortega is a 28 y.o. female.  PMH ?DM, not on meds, probably prednisone induced. Office GI visit for minor rectal bleeding in 05/2019.  Rectal exam unremarkable.  She was having no other GI symptoms and was not anemic.  Dr. Henrene Pastor added Anusol suppositories and Metamucil. Urgent care/ED referred pt back to GI APP in mid March with bloody diarrhea, abdominal pain, nausea, vomiting.  CT scans 09/19/18, 09/29/2018 both showed pancolitis, slightly worse on the latter CT.  Colonoscopy 10/01/2018, Dr. Loletha Carrow, during her first inpt admission.  Confirmed pancolitis c/w UC.  Pathology: severe, acute colitis with ulceration, cryptitis, crypt abscess formation c/w ulcerative colitis.  Stool studies negative for C diff.  Discharged on Prednisone 40 mg/day, prn Tramadol, vicodin,  and complete 7 additional days Cipro, Flagyl.     She continued to have abdominal pain requiring Vicodin.  Dr. Henrene Pastor later recommended extra strength Tylenol as he was not willing to prescribe additional narcotics.  He RXd Lialda 4.8 g daily on 3/31 and advised prednisone taper by 10 mg every 2 weeks.  A prescription for Zofran was provided 10/10/2018, Bentyl was added on 10/16/18 but has not proven effective..  Pain and bloody stool continued/continues but improved as of 4/14 virtual visit, some nocturnal symptoms.  Appetite had improved but she developed nausea and was not able to eat much soon after the virtual visit.  Due to marginal improvement Dr. Henrene Pastor increased prednisone from 30 mg to  40 mg daily, added Rowasa enemas at night along with ongoing Lialda, prn Bentyl.  After 10/26/18 telephone call with Dr. Bryan Lemma, GI PCR and C. difficile studies ordered (negative 4/17), H pylori ordered (neagative 4/21).  Quantiferon test 3/24 determined to be negative after consultation with health dept.    Started on Prilosec 20 mg twice daily, prn Phenergan but did not help.  Typically having increased postprandial epigastric pain, bloody stools about 6 times daily with nocturnal incidents.  Feels she has been able to maintain hydration and p.o. intake.  Overall has felt no improvement to slightly worse in the last week.  Isolated fever on 10/26/18 and again on 4/20 in PM  Dr. Henrene Pastor ordered CT scan.  This morning she was advised to proceed to the emergency department after CT was completed. The CT of the abdomen pelvis with contrast this morning shows mild improvement of her diffuse colitis.  No abscess, no fistula, no obstruction.  New finding of acute DVT in the left common and internal iliac veins. Lower extremity Doppler studies confirm bilateral calf DVTs.   Vascular surgery took her to OR for IVC filter.   Abnormal labs include hypokalemia, 3.1.  Hgb 9.3.  No fever in ED. Temps have been reaching 102 and are associated with chills and sweats.  Currently she is  hungry.  Abdominal pain is primarily in the lower abdomen/pelvic area bilaterally.  Stools are mostly bloody, occurring about 6 times a day and moderately large in volume.  Has been primarily laying in bed since her discharge 10/03/2018.  Clothes are fitting looser than they were 2 or 3 months ago but she does not think she is lost more than 5 pounds.    Past Medical History:  Diagnosis Date  . Ankle sprain   . Anxiety   . Depression    denies hospitalization or medication  . DVT (deep venous thrombosis) ([place]) 10/31/2018  . Hyponatremia   . Universal ulcerative colitis with rectal bleeding ([place]) 10/31/2018    Past Surgical History:   Procedure Laterality Date  . BIOPSY  10/01/2018   Procedure: BIOPSY;  Surgeon: Doran Stabler, MD;  Location: [place];  Service: Endoscopy;;  . COLONOSCOPY WITH PROPOFOL N/A 10/01/2018   Procedure: COLONOSCOPY WITH PROPOFOL;  Surgeon: Doran Stabler, MD;  Location: [facility];  Service: Endoscopy;  Laterality: N/A;  . WISDOM TOOTH EXTRACTION      Prior to Admission medications   Medication Sig Start Date End Date Taking? Authorizing Provider  acetaminophen (ACETAMINOPHEN EXTRA STRENGTH) 500 MG tablet Take 1,000 mg by mouth every 6 (six) hours as needed.   Yes [provider]  aspirin-acetaminophen-caffeine (EXCEDRIN MIGRAINE) [id number] MG tablet Take 2 tablets by mouth every 6 (six) hours as needed for headache.   Yes [provider]  dicyclomine (BENTYL) 10 MG capsule Take 1-2 by mouth every 4-6 hours as needed for abdominal pain Patient taking differently: Take 10-20 mg by mouth every 6 (six) hours as needed (abdominal pain).  10/16/18  Yes Irene Shipper, MD  HYDROcodone-acetaminophen (NORCO/VICODIN) 5-325 MG tablet Take 1 tablet by mouth every 6 (six) hours as needed for moderate pain.   Yes [provider]  loperamide (IMODIUM) 2 MG capsule Take 2 mg by mouth daily as needed for diarrhea or loose stools.   Yes [provider]  mesalamine (LIALDA) 1.2 g EC tablet Take 4 tablets (4.8 g total) by mouth daily with breakfast. 10/10/18  Yes Irene Shipper, MD  mesalamine (ROWASA) 4 g enema Place 60 mLs (4 g total) rectally at bedtime. 10/24/18  Yes Irene Shipper, MD  omeprazole (PRILOSEC) 20 MG capsule Take 1 capsule (20 mg total) by mouth 2 (two) times daily before a meal. 10/26/18  Yes Cirigliano, Vito V, DO  ondansetron (ZOFRAN) 4 MG tablet Take 1 tablet (4 mg total) by mouth every 6 (six) hours. Patient taking differently: Take 4 mg by mouth every 6 (six) hours as needed for nausea or vomiting.  10/10/18  Yes Irene Shipper, MD  predniSONE (DELTASONE)  20 MG tablet Use as directed Patient taking differently: Take 40 mg by mouth daily. Use as directed 10/24/18  Yes Irene Shipper, MD  promethazine (PHENERGAN) 25 MG tablet Take 1 tablet (25 mg total) by mouth every 6 (six) hours as needed for nausea or vomiting. 10/26/18  Yes Cirigliano, Vito V, DO  traMADol (ULTRAM) 50 MG tablet Take 1 tablet (50 mg total) by mouth every 6 (six) hours as needed. Patient taking differently: Take 50 mg by mouth every 6 (six) hours as needed for moderate pain.  09/26/18 09/26/19 Yes Levin Erp, PA    Scheduled Meds:  Infusions:  PRN Meds:    Allergies as of 10/31/2018  . (No Known Allergies)    Family History  Problem  Relation Age of Onset  . Diabetes Mother   . Hypertension Father   . Heart disease Maternal Aunt        enlarged heart  . Colon cancer Neg Hx   . Esophageal cancer Neg Hx   . Stomach cancer Neg Hx   . Pancreatic cancer Neg Hx   . Liver disease Neg Hx   . 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  Dizzy with standing, resolves after she stands for a while. ENT:  No nose bleeds Pulm: No shortness of breath.  No cough. CV:  No palpitations, no LE edema.  GU:  No hematuria, no frequency GI: Per HPI. MS: No pain in her calves or legs. Heme: Other than the bloody stools, no unusual bleeding or bruising. Transfusions: None. Neuro:  No headaches, no peripheral tingling or numbness.  Syncope. Derm:  No itching, no rash or sores.  Endocrine:  No sweats or chills.  No polyuria or dysuria Immunization: Reviewed.  She had her flu  shot in 04/2018 Travel:  None beyond local counties in last few months.    PHYSICAL EXAM: Vital signs in last 24 hours: Vitals:   10/31/18 1525 10/31/18 1600  BP: 113/74 112/78  Pulse: 94 (!) 104  Resp: 18 16  Temp: 97.7 F (36.5 C) 97.7 F (36.5 C)  SpO2: 97% 97%   Wt Readings from Last 3 Encounters:  10/31/18 68 kg  10/30/18 68.9 kg  10/23/18 68 kg    General: Very pale, comfortable, not acutely ill-appearing WF Head: No facial asymmetry or swelling.  No signs of head trauma. Eyes: No scleral icterus.  No conjunctival pallor.  EOMI. Ears: Not hard of hearing Nose: No congestion or discharge Mouth: Moist, pink, clear oral mucosa.  Tongue midline.  Good dentition. Neck: No JVD, no masses, no thyromegaly. Lungs: Clear bilaterally.  No labored breathing or cough. Heart: RRR.  No MRG.  S1, S2 present. Abdomen: Nondistended, soft.  Bowel sounds active.  Mild to moderate tenderness bilaterally in the lower abdomen/pelvis.  No guarding or rebound.  No masses, no HSM, no bruits, no hernias..   Rectal: Not performed, deferred. Musc/Skeltl: No joint redness, swelling or gross deformity.  Extremities: No CCE. Neurologic: Fully alert and oriented.  Appropriate.  No limb weakness, no tremors.  No gross deficits. Skin: Pale.  No suspicious lesions, rashes or sores. Nodes: No cervical adenopathy. Psych: Pleasant, cooperative, calm.  Fluid speech.   LAB RESULTS: Recent Labs    10/30/18 1221 10/31/18 1006 10/31/18 1656  WBC 10.4 8.9 8.3  HGB 9.9* 9.3* 8.1*  HCT 30.1* 30.6* 25.9*  PLT 268.0 241 203   BMET Lab Results  Component Value Date   NA 132 (L) 10/31/2018   NA 134 (L) 10/30/2018   NA 135 10/03/2018   K 3.1 (L) 10/31/2018   K 3.6 10/30/2018   K 4.3 10/03/2018   CL 96 (L) 10/31/2018   CL 98 10/30/2018   CL  103 10/03/2018   CO2 24 10/31/2018   CO2 29 10/30/2018   CO2 27 10/03/2018   GLUCOSE 150 (H) 10/31/2018   GLUCOSE 166 (H) 10/30/2018   GLUCOSE 324 (H)  10/03/2018   BUN 6 10/31/2018   BUN 8 10/30/2018   BUN 7 10/03/2018   CREATININE 0.85 10/31/2018   CREATININE 0.70 10/30/2018   CREATININE 0.62 10/03/2018   CALCIUM 8.3 (L) 10/31/2018   CALCIUM 8.6 10/30/2018   CALCIUM 7.9 (L) 10/03/2018   LFT Recent Labs    10/30/18 1221 10/31/18 1006  PROT 6.3 5.8*  ALBUMIN 3.1* 2.4*  AST 16 16  ALT 19 21  ALKPHOS 70 62  BILITOT 0.3 0.4   Lipase     Component Value Date/Time   LIPASE 63 (H) 10/31/2018 1006     RADIOLOGY STUDIES: Ct Abdomen Pelvis W Contrast  Result Date: 10/31/2018 CLINICAL DATA:  Abdominal pain for over 1 month. Rectal bleeding. Ulcerative colitis. EXAM: CT ABDOMEN AND PELVIS WITH CONTRAST TECHNIQUE: Multidetector CT imaging of the abdomen and pelvis was performed using the standard protocol following bolus administration of intravenous contrast. CONTRAST:  15m OMNIPAQUE IOHEXOL 300 MG/ML  SOLN COMPARISON:  09/29/2018 FINDINGS: Lower Chest: No acute findings. Hepatobiliary: No hepatic masses identified. Focal fatty infiltration is seen in the central liver. Gallbladder is unremarkable. Pancreas:  No mass or inflammatory changes. Spleen: Within normal limits in size and appearance. Adrenals/Urinary Tract: No masses identified. No evidence of hydronephrosis. Stomach/Bowel: Diffuse colitis shows mild improvement since previous study. No evidence of small bowel involvement. No evidence of abscess or fistula. Vascular/Lymphatic: No pathologically enlarged lymph nodes. No abdominal aortic aneurysm. Acute DVT is seen in the left common and internal iliac vein, new since prior exam. Reproductive:  No mass or other significant abnormality. Other:  None. Musculoskeletal:  No suspicious bone lesions identified. IMPRESSION: 1. Mild improvement in diffuse colitis since previous study. No evidence of abscess or fistula. 2. Acute DVT in left common and internal iliac veins, new since prior exam. These results will be called to the ordering  clinician or representative by the Radiologist Assistant, and communication documented in the PACS or zVision Dashboard. Electronically Signed   By: JEarle GellM.D.   On: 10/31/2018 10:21   Vas UKoreaLower Extremity Venous (dvt) (only [facility])  Result Date: 10/31/2018  Lower Venous Study Other Indications: Abdomen pain, DVT noted in left CFV and iliac on CT scan.  Examination Guidelines: A complete evaluation includes B-mode imaging, spectral Doppler, color Doppler, and power Doppler as needed of all accessible portions of each vessel. Bilateral testing is considered an integral part of a complete examination. Limited examinations for reoccurring indications may be performed as noted.  +---------+---------------+---------+-----------+----------+-------+ RIGHT    CompressibilityPhasicitySpontaneityPropertiesSummary +---------+---------------+---------+-----------+----------+-------+ CFV      Full           Yes      Yes                          +---------+---------------+---------+-----------+----------+-------+ SFJ      Full                                                 +---------+---------------+---------+-----------+----------+-------+ FV Prox  Full                                                 +---------+---------------+---------+-----------+----------+-------+  FV Mid   Full                                                 +---------+---------------+---------+-----------+----------+-------+ FV DistalFull                                                 +---------+---------------+---------+-----------+----------+-------+ PFV      Full                                                 +---------+---------------+---------+-----------+----------+-------+ POP      Full           Yes      Yes                          +---------+---------------+---------+-----------+----------+-------+ PTV      Full                                                  +---------+---------------+---------+-----------+----------+-------+ PERO     None                                         Acute   +---------+---------------+---------+-----------+----------+-------+   +---------+---------------+---------+-----------+----------+-------+ LEFT     CompressibilityPhasicitySpontaneityPropertiesSummary +---------+---------------+---------+-----------+----------+-------+ CFV      Full           Yes      Yes                          +---------+---------------+---------+-----------+----------+-------+ SFJ      Full                                                 +---------+---------------+---------+-----------+----------+-------+ FV Prox  Full                                                 +---------+---------------+---------+-----------+----------+-------+ FV Mid   Full                                                 +---------+---------------+---------+-----------+----------+-------+ FV DistalFull                                                 +---------+---------------+---------+-----------+----------+-------+ PFV      Full                                                 +---------+---------------+---------+-----------+----------+-------+  POP      Full           Yes      Yes                          +---------+---------------+---------+-----------+----------+-------+ PTV      None                                         Acute   +---------+---------------+---------+-----------+----------+-------+ PERO     Full                                                 +---------+---------------+---------+-----------+----------+-------+ Ex iliac Full           Yes      Yes                          +---------+---------------+---------+-----------+----------+-------+ Left external iliac vein was imaged and no evidence of DVT.    Summary: Right: Findings consistent with acute deep vein thrombosis involving the right  peroneal vein. Left: Findings consistent with acute deep vein thrombosis involving the left posterior tibial vein.  *See table(s) above for measurements and observations. Electronically signed by Curt Jews MD on 10/31/2018 at 4:02:57 PM.    Final      IMPRESSION:   *   Universal Ulcerative Colitis.  Despite the persistent abd pain, bloody diarrhea, CT this AM does shows mild improvement in pan-colitis.    *  DVT of left iliacs and bil calfs.   Dr Early placed IVC filter today.    *    Hyperglycemia, serum glucose as high as 324 on 10/03/2018, the day of discharge.  Measurements of 166, 150 yesterday and today.  Suspect steroid associated hyperglycemia.       PLAN:     *   Begin Solu-Medrol 30 mg IV twice daily.  *    Ordered soft diet, patient is hungry and after talking to her about various diets she feels like this would be a good choice as she has been tolerating solid food despite nausea.       Azucena Freed PA-C  10/31/2018, 6:00 PM Phone [phone]    [facility] Attending   I have taken an interval history, reviewed the chart and examined the patient. I agree with the Advanced Practitioner's note, impression and recommendations.   1) Universal Ulcerative Colitis not responding to 1 month of high-dose steroid therapy + mesalamine oral and rectal - though improved some on CT clinically same or only partially better  2) New L ileo femoral DVT s/p IVC filter today  3) Fever x 1 week  - ? 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  4) Blood loss anemia   5) Moderate malnutrition Plan for:  1) Colonoscopy and bxs tomorrow  2) We discussed likely need for Remicade Tx (quantiferon is neg and immune to Hep B)   3) I have reviewed risks benefits and indications and giver her CCFA handout about biologic Tx and will  discuss further  4) I  do not think she needs q 6 CBC or rectal mesalamine and maybe not oral mesalamine   5) watch blood sugars  6) Dietitian to see  7) CBC, CRP, B12, ferritin and type and screen AM   Colonoscopy likely about 2 PM tomorrow The risks and benefits as well as alternatives of endoscopic procedure(s) have been discussed and reviewed. All questions answered. The patient agrees to proceed.   Gatha Mayer, MD, [facility] 10/31/2018 6:00 PM Pager [phone]

## 2018-10-31 NOTE — H&P (Signed)
History and Physical   Monica Ortega ION:[id number] DOB: 18-Aug-1990 DOA: 10/31/2018  Referring MD/NP/PA: Dr. Jola Schmidt  PCP: [name], DO   Outpatient Specialists: Dr. Scarlette Shorts gastroenterology  Patient coming from: Home  Chief Complaint: Abdominal pain and rectal bleed  HPI: Monica Ortega is a 28 y.o. female with medical history significant of Ulcerative colitis, depression, hyperglycemia, who presented to the ER with abdominal pain and fever.  Also rectal bleed.  Patient was seen through telehealth by her gastroenterologist who referred her to the ER due to worsening symptoms.  She has been on prednisone as well as Mesalamine.  She was confirmed back in March of this year to have her ulcerative colitis.  She is still on prednisone dose taper currently 40 mg a day.  On arrival in the ER patient was worked up and found to still have diffuse abdominal tenderness evidence of rectal bleed but new finding of acute DVT in the left common and internal iliac veins.  Patient is therefore being admitted for this complex management.  Gastroenterology as well as vascular surgery have been consulted for decision making.  She has been admitted to the medical service.  She currently has pain at 5 out of 10 around the periumbilical region.  She denied any fever or chills.  Denied any nausea or vomiting..  ED Course: White count is 8.9.  She is afebrile.  Blood pressure stable.  Her heart rate is 123, hemoglobin 9.3 and platelets 241.  Sodium is 132 potassium 3.1 chloride 96 CO2 24 with a calcium 8.3.  Glucose is 150.  CT abdomen pelvis showed mild improvement in diffuse colitis since March.  There is acute DVT in left common and internal iliac veins which is new.  Venous Doppler ultrasound the lower extremity showed acute DVT involving the right peroneal vein as well as the left posterior tibial vein.  Patient is being admitted.  Due to active GI bleed vascular surgery consulted for inputs.  Review of  Systems: As per HPI otherwise 10 point review of systems negative.    Past Medical History:  Diagnosis Date  . Ankle sprain   . Anxiety   . Depression    denies hospitalization or medication  . Hyponatremia   . Ulcerative colitis [facility])     Past Surgical History:  Procedure Laterality Date  . BIOPSY  10/01/2018   Procedure: BIOPSY;  Surgeon: Doran Stabler, MD;  Location: [place];  Service: Endoscopy;;  . COLONOSCOPY WITH PROPOFOL N/A 10/01/2018   Procedure: COLONOSCOPY WITH PROPOFOL;  Surgeon: Doran Stabler, MD;  Location: [place];  Service: Endoscopy;  Laterality: N/A;  . WISDOM TOOTH EXTRACTION       reports that she has never smoked. She has never used smokeless tobacco. She reports that she does not drink alcohol or use drugs.  No Known Allergies  Family History  Problem Relation Age of Onset  . Diabetes Mother   . Hypertension Father   . Heart disease Maternal Aunt        enlarged heart  . Colon cancer Neg Hx   . Esophageal cancer Neg Hx   . Stomach cancer Neg Hx   . Pancreatic cancer Neg Hx   . Liver disease Neg Hx   . Inflammatory bowel disease Neg Hx      Prior to Admission medications   Medication Sig Start Date End Date Taking? Authorizing Provider  acetaminophen (ACETAMINOPHEN EXTRA STRENGTH) 500 MG tablet Take 1,000 mg  by mouth every 6 (six) hours as needed.   Yes [provider]  aspirin-acetaminophen-caffeine (EXCEDRIN MIGRAINE) [phone] MG tablet Take 2 tablets by mouth every 6 (six) hours as needed for headache.   Yes [provider]  dicyclomine (BENTYL) 10 MG capsule Take 1-2 by mouth every 4-6 hours as needed for abdominal pain Patient taking differently: Take 10-20 mg by mouth every 6 (six) hours as needed (abdominal pain).  10/16/18  Yes Irene Shipper, MD  HYDROcodone-acetaminophen (NORCO/VICODIN) 5-325 MG tablet Take 1 tablet by mouth every 6 (six) hours as needed for moderate pain.   Yes [provider]   loperamide (IMODIUM) 2 MG capsule Take 2 mg by mouth daily as needed for diarrhea or loose stools.   Yes [provider]  mesalamine (LIALDA) 1.2 g EC tablet Take 4 tablets (4.8 g total) by mouth daily with breakfast. 10/10/18  Yes Irene Shipper, MD  mesalamine (ROWASA) 4 g enema Place 60 mLs (4 g total) rectally at bedtime. 10/24/18  Yes Irene Shipper, MD  omeprazole (PRILOSEC) 20 MG capsule Take 1 capsule (20 mg total) by mouth 2 (two) times daily before a meal. 10/26/18  Yes Cirigliano, Vito V, DO  ondansetron (ZOFRAN) 4 MG tablet Take 1 tablet (4 mg total) by mouth every 6 (six) hours. Patient taking differently: Take 4 mg by mouth every 6 (six) hours as needed for nausea or vomiting.  10/10/18  Yes Irene Shipper, MD  predniSONE (DELTASONE) 20 MG tablet Use as directed Patient taking differently: Take 40 mg by mouth daily. Use as directed 10/24/18  Yes Irene Shipper, MD  promethazine (PHENERGAN) 25 MG tablet Take 1 tablet (25 mg total) by mouth every 6 (six) hours as needed for nausea or vomiting. 10/26/18  Yes Cirigliano, Vito V, DO  traMADol (ULTRAM) 50 MG tablet Take 1 tablet (50 mg total) by mouth every 6 (six) hours as needed. Patient taking differently: Take 50 mg by mouth every 6 (six) hours as needed for moderate pain.  09/26/18 09/26/19 Yes Levin Erp, PA    Physical Exam: Vitals:   10/31/18 1000 10/31/18 1045 10/31/18 1115 10/31/18 1215  BP: 115/79 117/85 117/82 116/86  Pulse: (!) 123 (!) 104 (!) 101 99  Resp: 18 (!) 24 20 15   Temp: 98.3 F (36.8 C)     TempSrc: Oral     SpO2: 99% 100% 100% 99%  Weight:      Height:          Constitutional: NAD, calm, anxious Vitals:   10/31/18 1000 10/31/18 1045 10/31/18 1115 10/31/18 1215  BP: 115/79 117/85 117/82 116/86  Pulse: (!) 123 (!) 104 (!) 101 99  Resp: 18 (!) 24 20 15   Temp: 98.3 F (36.8 C)     TempSrc: Oral     SpO2: 99% 100% 100% 99%  Weight:      Height:       Eyes: PERRL, lids and conjunctivae  normal ENMT: Mucous membranes are moist. Posterior pharynx clear of any exudate or lesions.Normal dentition.  Neck: normal, supple, no masses, no thyromegaly Respiratory: clear to auscultation bilaterally, no wheezing, no crackles. Normal respiratory effort. No accessory muscle use.  Cardiovascular: Tachypnea, no murmurs / rubs / gallops. No extremity edema. 2+ pedal pulses. No carotid bruits.  Abdomen: Diffuse abdominal tenderness, no masses palpated. No hepatosplenomegaly. Bowel sounds positive.  Musculoskeletal: no clubbing / cyanosis. No joint deformity upper and lower extremities. Good ROM, no contractures. Normal muscle  tone.  Skin: no rashes, lesions, ulcers. No induration Neurologic: CN 2-12 grossly intact. Sensation intact, DTR normal. Strength 5/5 in all 4.  Psychiatric: Normal judgment and insight. Alert and oriented x 3. Normal mood.     Labs on Admission: I have personally reviewed following labs and imaging studies  CBC: Recent Labs  Lab 10/30/18 1221 10/31/18 1006  WBC 10.4 8.9  NEUTROABS 9.5* 6.5  HGB 9.9* 9.3*  HCT 30.1* 30.6*  MCV 79.9 85.0  PLT 268.0 841   Basic Metabolic Panel: Recent Labs  Lab 10/30/18 1221 10/31/18 1006  NA 134* 132*  K 3.6 3.1*  CL 98 96*  CO2 29 24  GLUCOSE 166* 150*  BUN 8 6  CREATININE 0.70 0.85  CALCIUM 8.6 8.3*   GFR: Estimated Creatinine Clearance: 89.5 mL/min (by C-G formula based on SCr of 0.85 mg/dL). Liver Function Tests: Recent Labs  Lab 10/30/18 1221 10/31/18 1006  AST 16 16  ALT 19 21  ALKPHOS 70 62  BILITOT 0.3 0.4  PROT 6.3 5.8*  ALBUMIN 3.1* 2.4*   Recent Labs  Lab 10/31/18 1006  LIPASE 63*   No results for input(s): AMMONIA in the last 168 hours. Coagulation Profile: No results for input(s): INR, PROTIME in the last 168 hours. Cardiac Enzymes: No results for input(s): CKTOTAL, CKMB, CKMBINDEX, TROPONINI in the last 168 hours. BNP (last 3 results) No results for input(s): PROBNP in the last 8760  hours. HbA1C: No results for input(s): HGBA1C in the last 72 hours. CBG: No results for input(s): GLUCAP in the last 168 hours. Lipid Profile: No results for input(s): CHOL, HDL, LDLCALC, TRIG, CHOLHDL, LDLDIRECT in the last 72 hours. Thyroid Function Tests: No results for input(s): TSH, T4TOTAL, FREET4, T3FREE, THYROIDAB in the last 72 hours. Anemia Panel: No results for input(s): VITAMINB12, FOLATE, FERRITIN, TIBC, IRON, RETICCTPCT in the last 72 hours. Urine analysis:    Component Value Date/Time   COLORURINE YELLOW 09/29/2018 1321   APPEARANCEUR CLEAR 09/29/2018 1321   LABSPEC >1.046 (H) 09/29/2018 1321   PHURINE 5.0 09/29/2018 1321   GLUCOSEU NEGATIVE 09/29/2018 1321   HGBUR SMALL (A) 09/29/2018 1321   BILIRUBINUR NEGATIVE 09/29/2018 1321   KETONESUR 20 (A) 09/29/2018 1321   PROTEINUR NEGATIVE 09/29/2018 1321   NITRITE NEGATIVE 09/29/2018 1321   LEUKOCYTESUR TRACE (A) 09/29/2018 1321   Sepsis Labs: @LABRCNTIP (procalcitonin:4,lacticidven:4) )No results found for this or any previous visit (from the past 240 hour(s)).   Radiological Exams on Admission: Ct Abdomen Pelvis W Contrast  Result Date: 10/31/2018 CLINICAL DATA:  Abdominal pain for over 1 month. Rectal bleeding. Ulcerative colitis. EXAM: CT ABDOMEN AND PELVIS WITH CONTRAST TECHNIQUE: Multidetector CT imaging of the abdomen and pelvis was performed using the standard protocol following bolus administration of intravenous contrast. CONTRAST:  133m OMNIPAQUE IOHEXOL 300 MG/ML  SOLN COMPARISON:  09/29/2018 FINDINGS: Lower Chest: No acute findings. Hepatobiliary: No hepatic masses identified. Focal fatty infiltration is seen in the central liver. Gallbladder is unremarkable. Pancreas:  No mass or inflammatory changes. Spleen: Within normal limits in size and appearance. Adrenals/Urinary Tract: No masses identified. No evidence of hydronephrosis. Stomach/Bowel: Diffuse colitis shows mild improvement since previous study. No  evidence of small bowel involvement. No evidence of abscess or fistula. Vascular/Lymphatic: No pathologically enlarged lymph nodes. No abdominal aortic aneurysm. Acute DVT is seen in the left common and internal iliac vein, new since prior exam. Reproductive:  No mass or other significant abnormality. Other:  None. Musculoskeletal:  No suspicious bone lesions identified.  IMPRESSION: 1. Mild improvement in diffuse colitis since previous study. No evidence of abscess or fistula. 2. Acute DVT in left common and internal iliac veins, new since prior exam. These results will be called to the ordering clinician or representative by the Radiologist Assistant, and communication documented in the PACS or zVision Dashboard. Electronically Signed   By: Earle Gell M.D.   On: 10/31/2018 10:21   Vas [place] Lower Extremity Venous (dvt) (only [facility])  Result Date: 10/31/2018  Lower Venous Study Other Indications: Abdomen pain, DVT noted in left CFV and iliac on CT scan.  Examination Guidelines: A complete evaluation includes B-mode imaging, spectral Doppler, color Doppler, and power Doppler as needed of all accessible portions of each vessel. Bilateral testing is considered an integral part of a complete examination. Limited examinations for reoccurring indications may be performed as noted.  +---------+---------------+---------+-----------+----------+-------+ RIGHT    CompressibilityPhasicitySpontaneityPropertiesSummary +---------+---------------+---------+-----------+----------+-------+ CFV      Full           Yes      Yes                          +---------+---------------+---------+-----------+----------+-------+ SFJ      Full                                                 +---------+---------------+---------+-----------+----------+-------+ FV Prox  Full                                                 +---------+---------------+---------+-----------+----------+-------+ FV Mid   Full                                                  +---------+---------------+---------+-----------+----------+-------+ FV DistalFull                                                 +---------+---------------+---------+-----------+----------+-------+ PFV      Full                                                 +---------+---------------+---------+-----------+----------+-------+ POP      Full           Yes      Yes                          +---------+---------------+---------+-----------+----------+-------+ PTV      Full                                                 +---------+---------------+---------+-----------+----------+-------+ PERO     None  Acute   +---------+---------------+---------+-----------+----------+-------+   +---------+---------------+---------+-----------+----------+-------+ LEFT     CompressibilityPhasicitySpontaneityPropertiesSummary +---------+---------------+---------+-----------+----------+-------+ CFV      Full           Yes      Yes                          +---------+---------------+---------+-----------+----------+-------+ SFJ      Full                                                 +---------+---------------+---------+-----------+----------+-------+ FV Prox  Full                                                 +---------+---------------+---------+-----------+----------+-------+ FV Mid   Full                                                 +---------+---------------+---------+-----------+----------+-------+ FV DistalFull                                                 +---------+---------------+---------+-----------+----------+-------+ PFV      Full                                                 +---------+---------------+---------+-----------+----------+-------+ POP      Full           Yes      Yes                           +---------+---------------+---------+-----------+----------+-------+ PTV      None                                         Acute   +---------+---------------+---------+-----------+----------+-------+ PERO     Full                                                 +---------+---------------+---------+-----------+----------+-------+ Ex iliac Full           Yes      Yes                          +---------+---------------+---------+-----------+----------+-------+ Left external iliac vein was imaged and no evidence of DVT.    Summary: Right: Findings consistent with acute deep vein thrombosis involving the right peroneal vein. Left: Findings consistent with acute deep vein thrombosis involving the left posterior tibial vein.  *See table(s) above for measurements and observations.    Preliminary       Assessment/Plan Principal Problem:  DVT (deep vein thrombosis) in pregnancy Active Problems:   Rectal bleed   Ulcerative colitis (HCC)   Hypokalemia   Hyponatremia     #1 acute DVT bilaterally and common iliac artery: Patient has extensive lower extremity DVT complicated with the fact that she has lower GI bleed from colitis.  Dr. Donavan Burnet consulted for input regarding treatment.  Options include IVC filter, thrombectomy or cautious anticoagulation.  We will admit the patient.  Continue with supportive care including IV fluids and pain management at this point.  #2 ulcerative colitis flare: Patient on prednisone.  I will use IV Solu-Medrol for now.  Gastroenterology consulted.  Protonix and supportive care including pain management.  #3 hypokalemia: Most likely from diarrhea.  Replete potassium.  #4 hyponatremia: Appears to have some dehydration.  Saline infusion.   DVT prophylaxis: SCD Code Status: Full code Family Communication: Discussed care with patient no family at bedside Disposition Plan: To be determined Consults called: Dr. Sherren Mocha early, vascular surgery,  gastroenterology Admission status: Inpatient  Severity of Illness: The appropriate patient status for this patient is INPATIENT. Inpatient status is judged to be reasonable and necessary in order to provide the required intensity of service to ensure the patient's safety. The patient's presenting symptoms, physical exam findings, and initial radiographic and laboratory data in the context of their chronic comorbidities is felt to place them at high risk for further clinical deterioration. Furthermore, it is not anticipated that the patient will be medically stable for discharge from the hospital within 2 midnights of admission. The following factors support the patient status of inpatient.   " The patient's presenting symptoms include abdominal pain and fever. " The worrisome physical exam findings include diffuse abdominal tenderness. " The initial radiographic and laboratory data are worrisome because of evidence of DVT on CT abdomen and pelvis. " The chronic co-morbidities include ulcerative colitis.   * I certify that at the point of admission it is my clinical judgment that the patient will require inpatient hospital care spanning beyond 2 midnights from the point of admission due to high intensity of service, high risk for further deterioration and high frequency of surveillance required.Barbette Merino MD [facility] Pager [phone]  If 7PM-7AM, please contact night-coverage [email or web address] Password [facility]  10/31/2018, 12:46 PM

## 2018-10-31 NOTE — Telephone Encounter (Signed)
Spoke with pts sister and she states pt drank the contrast and that made her have a lot of pain. Discussed with them she could take one of her hydrocodone pills and to try and get there to have CT done. Sister also reports pt started running a fever last night and this am it is 102F. Sister to call back if pt cannot make it to appt. Dr. Henrene Pastor notified.

## 2018-10-31 NOTE — Transfer of Care (Signed)
Immediate Anesthesia Transfer of Care Note  Patient: Monica Ortega  Procedure(s) Performed: INSERTION VENA-CAVA FILTER (N/A )  Patient Location: PACU  Anesthesia Type:MAC  Level of Consciousness: awake, alert  and oriented  Airway & Oxygen Therapy: Patient Spontanous Breathing  Post-op Assessment: Report given to RN, Post -op Vital signs reviewed and stable and Patient moving all extremities X 4  Post vital signs: Reviewed and stable  Last Vitals:  Vitals Value Taken Time  BP 111/68 10/31/2018  2:57 PM  Temp    Pulse 99 10/31/2018  2:58 PM  Resp 15 10/31/2018  2:58 PM  SpO2 98 % 10/31/2018  2:58 PM  Vitals shown include unvalidated device data.  Last Pain:  Vitals:   10/31/18 1322  TempSrc:   PainSc: 6       Patients Stated Pain Goal: 4 (98/33/82 [id number])  Complications: No apparent anesthesia complications

## 2018-10-31 NOTE — H&P (View-Only) (Signed)
[facility] Consult: 6:00 PM 10/31/2018  LOS: 0 days    Referring Provider: ED physician,  Primary Care Physician:  [name], DO Primary Gastroenterologist:  Dr. Henrene Pastor   Reason for Consultation: Abdominal pain, bloody stools in patient with Crohn's disease.   HPI: Monica Ortega is a 28 y.o. female.  PMH ?DM, not on meds, probably prednisone induced. Office GI visit for minor rectal bleeding in 05/2019.  Rectal exam unremarkable.  She was having no other GI symptoms and was not anemic.  Dr. Henrene Pastor added Anusol suppositories and Metamucil. Urgent care/ED referred pt back to GI APP in mid March with bloody diarrhea, abdominal pain, nausea, vomiting.  CT scans 09/19/18, 09/29/2018 both showed pancolitis, slightly worse on the latter CT.  Colonoscopy 10/01/2018, Dr. Loletha Carrow, during her first inpt admission.  Confirmed pancolitis c/w UC.  Pathology: severe, acute colitis with ulceration, cryptitis, crypt abscess formation c/w ulcerative colitis.  Stool studies negative for C diff.  Discharged on Prednisone 40 mg/day, prn Tramadol, vicodin,  and complete 7 additional days Cipro, Flagyl.     She continued to have abdominal pain requiring Vicodin.  Dr. Henrene Pastor later recommended extra strength Tylenol as he was not willing to prescribe additional narcotics.  He RXd Lialda 4.8 g daily on 3/31 and advised prednisone taper by 10 mg every 2 weeks.  A prescription for Zofran was provided 10/10/2018, Bentyl was added on 10/16/18 but has not proven effective..  Pain and bloody stool continued/continues but improved as of 4/14 virtual visit, some nocturnal symptoms.  Appetite had improved but she developed nausea and was not able to eat much soon after the virtual visit.  Due to marginal improvement Dr. Henrene Pastor increased prednisone from 30 mg to  40 mg daily, added Rowasa enemas at night along with ongoing Lialda, prn Bentyl.  After 10/26/18 telephone call with Dr. Bryan Lemma, GI PCR and C. difficile studies ordered (negative 4/17), H pylori ordered (neagative 4/21).  Quantiferon test 3/24 determined to be negative after consultation with health dept.    Started on Prilosec 20 mg twice daily, prn Phenergan but did not help.  Typically having increased postprandial epigastric pain, bloody stools about 6 times daily with nocturnal incidents.  Feels she has been able to maintain hydration and p.o. intake.  Overall has felt no improvement to slightly worse in the last week.  Isolated fever on 10/26/18 and again on 4/20 in PM  Dr. Henrene Pastor ordered CT scan.  This morning she was advised to proceed to the emergency department after CT was completed. The CT of the abdomen pelvis with contrast this morning shows mild improvement of her diffuse colitis.  No abscess, no fistula, no obstruction.  New finding of acute DVT in the left common and internal iliac veins. Lower extremity Doppler studies confirm bilateral calf DVTs.   Vascular surgery took her to OR for IVC filter.   Abnormal labs include hypokalemia, 3.1.  Hgb 9.3.  No fever in ED. Temps have been reaching 102 and are associated with chills and sweats.  Currently she is  hungry.  Abdominal pain is primarily in the lower abdomen/pelvic area bilaterally.  Stools are mostly bloody, occurring about 6 times a day and moderately large in volume.  Has been primarily laying in bed since her discharge 10/03/2018.  Clothes are fitting looser than they were 2 or 3 months ago but she does not think she is lost more than 5 pounds.    Past Medical History:  Diagnosis Date  . Ankle sprain   . Anxiety   . Depression    denies hospitalization or medication  . DVT (deep venous thrombosis) ([place]) 10/31/2018  . Hyponatremia   . Universal ulcerative colitis with rectal bleeding ([place]) 10/31/2018    Past Surgical History:   Procedure Laterality Date  . BIOPSY  10/01/2018   Procedure: BIOPSY;  Surgeon: Doran Stabler, MD;  Location: [facility];  Service: Endoscopy;;  . COLONOSCOPY WITH PROPOFOL N/A 10/01/2018   Procedure: COLONOSCOPY WITH PROPOFOL;  Surgeon: Doran Stabler, MD;  Location: [facility];  Service: Endoscopy;  Laterality: N/A;  . WISDOM TOOTH EXTRACTION      Prior to Admission medications   Medication Sig Start Date End Date Taking? Authorizing Provider  acetaminophen (ACETAMINOPHEN EXTRA STRENGTH) 500 MG tablet Take 1,000 mg by mouth every 6 (six) hours as needed.   Yes [provider]  aspirin-acetaminophen-caffeine (EXCEDRIN MIGRAINE) [phone] MG tablet Take 2 tablets by mouth every 6 (six) hours as needed for headache.   Yes [provider]  dicyclomine (BENTYL) 10 MG capsule Take 1-2 by mouth every 4-6 hours as needed for abdominal pain Patient taking differently: Take 10-20 mg by mouth every 6 (six) hours as needed (abdominal pain).  10/16/18  Yes Irene Shipper, MD  HYDROcodone-acetaminophen (NORCO/VICODIN) 5-325 MG tablet Take 1 tablet by mouth every 6 (six) hours as needed for moderate pain.   Yes [provider]  loperamide (IMODIUM) 2 MG capsule Take 2 mg by mouth daily as needed for diarrhea or loose stools.   Yes [provider]  mesalamine (LIALDA) 1.2 g EC tablet Take 4 tablets (4.8 g total) by mouth daily with breakfast. 10/10/18  Yes Irene Shipper, MD  mesalamine (ROWASA) 4 g enema Place 60 mLs (4 g total) rectally at bedtime. 10/24/18  Yes Irene Shipper, MD  omeprazole (PRILOSEC) 20 MG capsule Take 1 capsule (20 mg total) by mouth 2 (two) times daily before a meal. 10/26/18  Yes Cirigliano, Vito V, DO  ondansetron (ZOFRAN) 4 MG tablet Take 1 tablet (4 mg total) by mouth every 6 (six) hours. Patient taking differently: Take 4 mg by mouth every 6 (six) hours as needed for nausea or vomiting.  10/10/18  Yes Irene Shipper, MD  predniSONE (DELTASONE)  20 MG tablet Use as directed Patient taking differently: Take 40 mg by mouth daily. Use as directed 10/24/18  Yes Irene Shipper, MD  promethazine (PHENERGAN) 25 MG tablet Take 1 tablet (25 mg total) by mouth every 6 (six) hours as needed for nausea or vomiting. 10/26/18  Yes Cirigliano, Vito V, DO  traMADol (ULTRAM) 50 MG tablet Take 1 tablet (50 mg total) by mouth every 6 (six) hours as needed. Patient taking differently: Take 50 mg by mouth every 6 (six) hours as needed for moderate pain.  09/26/18 09/26/19 Yes Levin Erp, PA    Scheduled Meds:  Infusions:  PRN Meds:    Allergies as of 10/31/2018  . (No Known Allergies)    Family History  Problem  Relation Age of Onset  . Diabetes Mother   . Hypertension Father   . Heart disease Maternal Aunt        enlarged heart  . Colon cancer Neg Hx   . Esophageal cancer Neg Hx   . Stomach cancer Neg Hx   . Pancreatic cancer Neg Hx   . Liver disease Neg Hx   . 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  Dizzy with standing, resolves after she stands for a while. ENT:  No nose bleeds Pulm: No shortness of breath.  No cough. CV:  No palpitations, no LE edema.  GU:  No hematuria, no frequency GI: Per HPI. MS: No pain in her calves or legs. Heme: Other than the bloody stools, no unusual bleeding or bruising. Transfusions: None. Neuro:  No headaches, no peripheral tingling or numbness.  Syncope. Derm:  No itching, no rash or sores.  Endocrine:  No sweats or chills.  No polyuria or dysuria Immunization: Reviewed.  She had her flu  shot in 04/2018 Travel:  None beyond local counties in last few months.    PHYSICAL EXAM: Vital signs in last 24 hours: Vitals:   10/31/18 1525 10/31/18 1600  BP: 113/74 112/78  Pulse: 94 (!) 104  Resp: 18 16  Temp: 97.7 F (36.5 C) 97.7 F (36.5 C)  SpO2: 97% 97%   Wt Readings from Last 3 Encounters:  10/31/18 68 kg  10/30/18 68.9 kg  10/23/18 68 kg    General: Very pale, comfortable, not acutely ill-appearing WF Head: No facial asymmetry or swelling.  No signs of head trauma. Eyes: No scleral icterus.  No conjunctival pallor.  EOMI. Ears: Not hard of hearing Nose: No congestion or discharge Mouth: Moist, pink, clear oral mucosa.  Tongue midline.  Good dentition. Neck: No JVD, no masses, no thyromegaly. Lungs: Clear bilaterally.  No labored breathing or cough. Heart: RRR.  No MRG.  S1, S2 present. Abdomen: Nondistended, soft.  Bowel sounds active.  Mild to moderate tenderness bilaterally in the lower abdomen/pelvis.  No guarding or rebound.  No masses, no HSM, no bruits, no hernias..   Rectal: Not performed, deferred. Musc/Skeltl: No joint redness, swelling or gross deformity.  Extremities: No CCE. Neurologic: Fully alert and oriented.  Appropriate.  No limb weakness, no tremors.  No gross deficits. Skin: Pale.  No suspicious lesions, rashes or sores. Nodes: No cervical adenopathy. Psych: Pleasant, cooperative, calm.  Fluid speech.   LAB RESULTS: Recent Labs    10/30/18 1221 10/31/18 1006 10/31/18 1656  WBC 10.4 8.9 8.3  HGB 9.9* 9.3* 8.1*  HCT 30.1* 30.6* 25.9*  PLT 268.0 241 203   BMET Lab Results  Component Value Date   NA 132 (L) 10/31/2018   NA 134 (L) 10/30/2018   NA 135 10/03/2018   K 3.1 (L) 10/31/2018   K 3.6 10/30/2018   K 4.3 10/03/2018   CL 96 (L) 10/31/2018   CL 98 10/30/2018   CL  103 10/03/2018   CO2 24 10/31/2018   CO2 29 10/30/2018   CO2 27 10/03/2018   GLUCOSE 150 (H) 10/31/2018   GLUCOSE 166 (H) 10/30/2018   GLUCOSE 324 (H)  10/03/2018   BUN 6 10/31/2018   BUN 8 10/30/2018   BUN 7 10/03/2018   CREATININE 0.85 10/31/2018   CREATININE 0.70 10/30/2018   CREATININE 0.62 10/03/2018   CALCIUM 8.3 (L) 10/31/2018   CALCIUM 8.6 10/30/2018   CALCIUM 7.9 (L) 10/03/2018   LFT Recent Labs    10/30/18 1221 10/31/18 1006  PROT 6.3 5.8*  ALBUMIN 3.1* 2.4*  AST 16 16  ALT 19 21  ALKPHOS 70 62  BILITOT 0.3 0.4   Lipase     Component Value Date/Time   LIPASE 63 (H) 10/31/2018 1006     RADIOLOGY STUDIES: Ct Abdomen Pelvis W Contrast  Result Date: 10/31/2018 CLINICAL DATA:  Abdominal pain for over 1 month. Rectal bleeding. Ulcerative colitis. EXAM: CT ABDOMEN AND PELVIS WITH CONTRAST TECHNIQUE: Multidetector CT imaging of the abdomen and pelvis was performed using the standard protocol following bolus administration of intravenous contrast. CONTRAST:  190m OMNIPAQUE IOHEXOL 300 MG/ML  SOLN COMPARISON:  09/29/2018 FINDINGS: Lower Chest: No acute findings. Hepatobiliary: No hepatic masses identified. Focal fatty infiltration is seen in the central liver. Gallbladder is unremarkable. Pancreas:  No mass or inflammatory changes. Spleen: Within normal limits in size and appearance. Adrenals/Urinary Tract: No masses identified. No evidence of hydronephrosis. Stomach/Bowel: Diffuse colitis shows mild improvement since previous study. No evidence of small bowel involvement. No evidence of abscess or fistula. Vascular/Lymphatic: No pathologically enlarged lymph nodes. No abdominal aortic aneurysm. Acute DVT is seen in the left common and internal iliac vein, new since prior exam. Reproductive:  No mass or other significant abnormality. Other:  None. Musculoskeletal:  No suspicious bone lesions identified. IMPRESSION: 1. Mild improvement in diffuse colitis since previous study. No evidence of abscess or fistula. 2. Acute DVT in left common and internal iliac veins, new since prior exam. These results will be called to the ordering  clinician or representative by the Radiologist Assistant, and communication documented in the PACS or zVision Dashboard. Electronically Signed   By: JEarle GellM.D.   On: 10/31/2018 10:21   Vas UKoreaLower Extremity Venous (dvt) (only [facility])  Result Date: 10/31/2018  Lower Venous Study Other Indications: Abdomen pain, DVT noted in left CFV and iliac on CT scan.  Examination Guidelines: A complete evaluation includes B-mode imaging, spectral Doppler, color Doppler, and power Doppler as needed of all accessible portions of each vessel. Bilateral testing is considered an integral part of a complete examination. Limited examinations for reoccurring indications may be performed as noted.  +---------+---------------+---------+-----------+----------+-------+ RIGHT    CompressibilityPhasicitySpontaneityPropertiesSummary +---------+---------------+---------+-----------+----------+-------+ CFV      Full           Yes      Yes                          +---------+---------------+---------+-----------+----------+-------+ SFJ      Full                                                 +---------+---------------+---------+-----------+----------+-------+ FV Prox  Full                                                 +---------+---------------+---------+-----------+----------+-------+  FV Mid   Full                                                 +---------+---------------+---------+-----------+----------+-------+ FV DistalFull                                                 +---------+---------------+---------+-----------+----------+-------+ PFV      Full                                                 +---------+---------------+---------+-----------+----------+-------+ POP      Full           Yes      Yes                          +---------+---------------+---------+-----------+----------+-------+ PTV      Full                                                  +---------+---------------+---------+-----------+----------+-------+ PERO     None                                         Acute   +---------+---------------+---------+-----------+----------+-------+   +---------+---------------+---------+-----------+----------+-------+ LEFT     CompressibilityPhasicitySpontaneityPropertiesSummary +---------+---------------+---------+-----------+----------+-------+ CFV      Full           Yes      Yes                          +---------+---------------+---------+-----------+----------+-------+ SFJ      Full                                                 +---------+---------------+---------+-----------+----------+-------+ FV Prox  Full                                                 +---------+---------------+---------+-----------+----------+-------+ FV Mid   Full                                                 +---------+---------------+---------+-----------+----------+-------+ FV DistalFull                                                 +---------+---------------+---------+-----------+----------+-------+ PFV      Full                                                 +---------+---------------+---------+-----------+----------+-------+  POP      Full           Yes      Yes                          +---------+---------------+---------+-----------+----------+-------+ PTV      None                                         Acute   +---------+---------------+---------+-----------+----------+-------+ PERO     Full                                                 +---------+---------------+---------+-----------+----------+-------+ Ex iliac Full           Yes      Yes                          +---------+---------------+---------+-----------+----------+-------+ Left external iliac vein was imaged and no evidence of DVT.    Summary: Right: Findings consistent with acute deep vein thrombosis involving the right  peroneal vein. Left: Findings consistent with acute deep vein thrombosis involving the left posterior tibial vein.  *See table(s) above for measurements and observations. Electronically signed by Curt Jews MD on 10/31/2018 at 4:02:57 PM.    Final      IMPRESSION:   *   Universal Ulcerative Colitis.  Despite the persistent abd pain, bloody diarrhea, CT this AM does shows mild improvement in pan-colitis.    *  DVT of left iliacs and bil calfs.   Dr Early placed IVC filter today.    *    Hyperglycemia, serum glucose as high as 324 on 10/03/2018, the day of discharge.  Measurements of 166, 150 yesterday and today.  Suspect steroid associated hyperglycemia.       PLAN:     *   Begin Solu-Medrol 30 mg IV twice daily.  *    Ordered soft diet, patient is hungry and after talking to her about various diets she feels like this would be a good choice as she has been tolerating solid food despite nausea.       Azucena Freed PA-C  10/31/2018, 6:00 PM Phone [phone]    Katie Attending   I have taken an interval history, reviewed the chart and examined the patient. I agree with the Advanced Practitioner's note, impression and recommendations.   1) Universal Ulcerative Colitis not responding to 1 month of high-dose steroid therapy + mesalamine oral and rectal - though improved some on CT clinically same or only partially better  2) New L ileo femoral DVT s/p IVC filter today  3) Fever x 1 week  - ? 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  4) Blood loss anemia   5) Moderate malnutrition Plan for:  1) Colonoscopy and bxs tomorrow  2) We discussed likely need for Remicade Tx (quantiferon is neg and immune to Hep B)   3) I have reviewed risks benefits and indications and giver her CCFA handout about biologic Tx and will  discuss further  4) I  do not think she needs q 6 CBC or rectal mesalamine and maybe not oral mesalamine   5) watch blood sugars  6) Dietitian to see  7) CBC, CRP, B12, ferritin and type and screen AM   Colonoscopy likely about 2 PM tomorrow The risks and benefits as well as alternatives of endoscopic procedure(s) have been discussed and reviewed. All questions answered. The patient agrees to proceed.   Gatha Mayer, MD, [facility] 10/31/2018 6:00 PM Pager [phone]

## 2018-10-31 NOTE — Telephone Encounter (Signed)
Pt's sister [name] called stating that her sister took the contrast and it made her pain even worse so she do:es not think that she can have ct scan that is scheduled for am, she also stated that she had fever last night. She would like some advise on what to do.

## 2018-10-31 NOTE — Telephone Encounter (Signed)
Reviewed with comments

## 2018-10-31 NOTE — Consult Note (Signed)
[facility]  Patient name: Monica Ortega MRN: [id number] DOB: 1991-03-29 Sex: female   HPI: Tylynn Braniff is a 28 y.o. female with history of ulcerative colitis.  She has had increased difficulty recently with rectal bleeding.  She was found to have a fever today of 102 and continued rectal bleeding and presented to the emergency department for further evaluation.  She had a CT scan today which revealed a large amount of clot in her left common iliac vein and internal iliac veins.  She subsequently underwent lower extremity venous duplex which revealed bilateral calf vein DVT.  She has no prior history of DVT or pulmonary embolus no history of hypercoagulability.  Denies any lower extremity swelling or pain.  Past Medical History:  Diagnosis Date  . Ankle sprain   . Anxiety   . Depression    denies hospitalization or medication  . Hyponatremia   . Ulcerative colitis ([place])     Family History  Problem Relation Age of Onset  . Diabetes Mother   . Hypertension Father   . Heart disease Maternal Aunt        enlarged heart  . Colon cancer Neg Hx   . Esophageal cancer Neg Hx   . Stomach cancer Neg Hx   . Pancreatic cancer Neg Hx   . Liver disease Neg Hx   . Inflammatory bowel disease Neg Hx     SOCIAL HISTORY: Social History   Tobacco Use  . Smoking status: Never Smoker  . Smokeless tobacco: Never Used  Substance Use Topics  . Alcohol use: No    Comment: denied alcohol    No Known Allergies  No current facility-administered medications for this encounter.     REVIEW OF SYSTEMS:  [X]  denotes positive finding, [ ]  denotes negative finding Cardiac  Comments:  Chest pain or chest pressure:    Shortness of breath upon exertion:    Short of breath when lying flat:    Irregular heart rhythm:        Vascular    Pain in calf, thigh, or hip brought on by ambulation:    Pain in feet at night that wakes you up  from your sleep:     Blood clot in your veins:    Leg swelling:           PHYSICAL EXAM: Vitals:   10/31/18 1000 10/31/18 1045 10/31/18 1115 10/31/18 1215  BP: 115/79 117/85 117/82 116/86  Pulse: (!) 123 (!) 104 (!) 101 99  Resp: 18 (!) 24 20 15   Temp: 98.3 F (36.8 C)     TempSrc: Oral     SpO2: 99% 100% 100% 99%  Weight:      Height:        GENERAL: The patient is a well-nourished female, in no acute distress. The vital signs are documented above. CARDIOVASCULAR: 2+ dorsalis pedis pulses bilaterally.  No calf tenderness. PULMONARY: There is good air exchange  MUSCULOSKELETAL: There are no major deformities or cyanosis. NEUROLOGIC: No focal weakness or paresthesias are detected. SKIN: There are no ulcers or rashes noted. PSYCHIATRIC: The patient has a normal affect.  DATA:  CT scan from today was reviewed and compared to CT scan from 1 month ago.  This shows a new finding that was not present 1 month ago of near occlusive clot in her left common iliac vein with extension into her internal iliac vein.  This does show some mild compression which is probably  normal as the vein tracks under the iliac artery.  No obvious evidence of May Thurner syndrome.  MEDICAL ISSUES: Discussed this at length with the patient.  Due to her rectal bleeding and ongoing treatment for her ulcerative colitis I feel that it is safest to proceed with vena cava filter placement.  I explained that this could be retrieved after her risk is resolved.  It would be ideal to also treat her with anticoagulation if GI feels this is possible given her rectal bleeding.  Patient understands the plan and will take her to the OR for vena cava filter placement    Rosetta Posner, MD [facility] Tel [phone] Pager [phone]

## 2018-10-31 NOTE — ED Triage Notes (Signed)
Pt states she has a hx of ulcerative colitis and states her abd pain has been getting worse these last couple of weeks along with nausea  But no vomiting ; her Gi doctor told her to come in because she had a fever of 102 last night and this morning

## 2018-10-31 NOTE — ED Provider Notes (Signed)
[place] EMERGENCY DEPARTMENT Provider Note   CSN: [id number] Arrival date & time: 10/31/18  1638    History   Chief Complaint Chief Complaint  Patient presents with  . Abdominal Pain  . Fever    HPI Monica Ortega is a 28 y.o. female.     HPI 28 year old female with a history of ulcerative colitis who has been dealing with intermittent abdominal pain and rectal bleeding over the past several weeks.  She has had nausea without vomiting.  She reports a fever to 102 last night.  She spoke with her GI doctor who ordered her an outpatient CT scan of the abdomen and pelvis today and asked her to come to the ER for admission to the hospitalist service with GI consultation for difficult to manage ulcerative colitis.  On outpatient CT imaging there is no acute intra-abdominal complications from her ulcerative colitis but she was found to incidentally have DVT in her left iliac vein.  No chest pain or shortness of breath.  No history of venous thromboembolic disease for the patient or the patient's family.  She does report decreased activity of the past several weeks and she may be clinically dehydrated.   Past Medical History:  Diagnosis Date  . Ankle sprain   . Anxiety   . Depression    denies hospitalization or medication  . Hyponatremia   . Ulcerative colitis [facility])     Patient Active Problem List   Diagnosis Date Noted  . Abdominal pain 09/30/2018  . Acute colitis 09/29/2018  . MDD (major depressive disorder), recurrent severe, without psychosis ([place]) 04/16/2015    Past Surgical History:  Procedure Laterality Date  . BIOPSY  10/01/2018   Procedure: BIOPSY;  Surgeon: Doran Stabler, MD;  Location: [place];  Service: Endoscopy;;  . COLONOSCOPY WITH PROPOFOL N/A 10/01/2018   Procedure: COLONOSCOPY WITH PROPOFOL;  Surgeon: Doran Stabler, MD;  Location: [place];  Service: Endoscopy;  Laterality: N/A;  . WISDOM TOOTH EXTRACTION       OB  History   No obstetric history on file.      Home Medications    Prior to Admission medications   Medication Sig Start Date End Date Taking? Authorizing Provider  acetaminophen (ACETAMINOPHEN EXTRA STRENGTH) 500 MG tablet Take 1,000 mg by mouth every 6 (six) hours as needed.   Yes [provider]  aspirin-acetaminophen-caffeine (EXCEDRIN MIGRAINE) [phone] MG tablet Take 2 tablets by mouth every 6 (six) hours as needed for headache.   Yes [provider]  dicyclomine (BENTYL) 10 MG capsule Take 1-2 by mouth every 4-6 hours as needed for abdominal pain Patient taking differently: Take 10-20 mg by mouth every 6 (six) hours as needed (abdominal pain).  10/16/18  Yes Irene Shipper, MD  HYDROcodone-acetaminophen (NORCO/VICODIN) 5-325 MG tablet Take 1 tablet by mouth every 6 (six) hours as needed for moderate pain.   Yes [provider]  loperamide (IMODIUM) 2 MG capsule Take 2 mg by mouth daily as needed for diarrhea or loose stools.   Yes [provider]  mesalamine (LIALDA) 1.2 g EC tablet Take 4 tablets (4.8 g total) by mouth daily with breakfast. 10/10/18  Yes Irene Shipper, MD  mesalamine (ROWASA) 4 g enema Place 60 mLs (4 g total) rectally at bedtime. 10/24/18  Yes Irene Shipper, MD  omeprazole (PRILOSEC) 20 MG capsule Take 1 capsule (20 mg total) by mouth 2 (two) times daily before a meal. 10/26/18  Yes Cirigliano, Vito V, DO  ondansetron (ZOFRAN) 4 MG tablet Take 1 tablet (4 mg total) by mouth every 6 (six) hours. Patient taking differently: Take 4 mg by mouth every 6 (six) hours as needed for nausea or vomiting.  10/10/18  Yes Irene Shipper, MD  predniSONE (DELTASONE) 20 MG tablet Use as directed Patient taking differently: Take 40 mg by mouth daily. Use as directed 10/24/18  Yes Irene Shipper, MD  promethazine (PHENERGAN) 25 MG tablet Take 1 tablet (25 mg total) by mouth every 6 (six) hours as needed for nausea or vomiting. 10/26/18  Yes Cirigliano, Vito V, DO   traMADol (ULTRAM) 50 MG tablet Take 1 tablet (50 mg total) by mouth every 6 (six) hours as needed. Patient taking differently: Take 50 mg by mouth every 6 (six) hours as needed for moderate pain.  09/26/18 09/26/19 Yes Levin Erp, PA    Family History Family History  Problem Relation Age of Onset  . Diabetes Mother   . Hypertension Father   . Heart disease Maternal Aunt        enlarged heart  . Colon cancer Neg Hx   . Esophageal cancer Neg Hx   . Stomach cancer Neg Hx   . Pancreatic cancer Neg Hx   . Liver disease Neg Hx   . Inflammatory bowel disease Neg Hx     Social History Social History   Tobacco Use  . Smoking status: Never Smoker  . Smokeless tobacco: Never Used  Substance Use Topics  . Alcohol use: No    Comment: denied alcohol  . Drug use: No    Comment: denied     Allergies   Patient has no known allergies.   Review of Systems Review of Systems  All other systems reviewed and are negative.    Physical Exam Updated Vital Signs BP 117/82   Pulse (!) 101   Temp 98.3 F (36.8 C) (Oral)   Resp 20   Ht 5' 3"  (1.6 m)   Wt 68 kg   LMP 09/26/2018 Comment: pt states no possible pregnancy  SpO2 100%   BMI 26.56 kg/m   Physical Exam Vitals signs and nursing note reviewed.  Constitutional:      General: She is not in acute distress.    Appearance: She is well-developed.  HENT:     Head: Normocephalic and atraumatic.  Neck:     Musculoskeletal: Normal range of motion.  Cardiovascular:     Rate and Rhythm: Normal rate and regular rhythm.     Heart sounds: Normal heart sounds.  Pulmonary:     Effort: Pulmonary effort is normal.     Breath sounds: Normal breath sounds.  Abdominal:     General: There is no distension.     Palpations: Abdomen is soft.     Tenderness: There is no abdominal tenderness.  Musculoskeletal: Normal range of motion.  Skin:    General: Skin is warm and dry.  Neurological:     Mental Status: She is alert and  oriented to person, place, and time.  Psychiatric:        Judgment: Judgment normal.      ED Treatments / Results  Labs (all labs ordered are listed, but only abnormal results are displayed) Labs Reviewed  CBC WITH DIFFERENTIAL/PLATELET - Abnormal; Notable for the following components:      Result Value   RBC 3.60 (*)    Hemoglobin 9.3 (*)    HCT 30.6 (*)  MCH 25.8 (*)    nRBC 0.4 (*)    All other components within normal limits  COMPREHENSIVE METABOLIC PANEL - Abnormal; Notable for the following components:   Sodium 132 (*)    Potassium 3.1 (*)    Chloride 96 (*)    Glucose, Bld 150 (*)    Calcium 8.3 (*)    Total Protein 5.8 (*)    Albumin 2.4 (*)    All other components within normal limits  LIPASE, BLOOD - Abnormal; Notable for the following components:   Lipase 63 (*)    All other components within normal limits  I-STAT BETA HCG BLOOD, ED ([facility], [facility], [facility] ONLY)    EKG None  Radiology Ct Abdomen Pelvis W Contrast  Result Date: 10/31/2018 CLINICAL DATA:  Abdominal pain for over 1 month. Rectal bleeding. Ulcerative colitis. EXAM: CT ABDOMEN AND PELVIS WITH CONTRAST TECHNIQUE: Multidetector CT imaging of the abdomen and pelvis was performed using the standard protocol following bolus administration of intravenous contrast. CONTRAST:  17m OMNIPAQUE IOHEXOL 300 MG/ML  SOLN COMPARISON:  09/29/2018 FINDINGS: Lower Chest: No acute findings. Hepatobiliary: No hepatic masses identified. Focal fatty infiltration is seen in the central liver. Gallbladder is unremarkable. Pancreas:  No mass or inflammatory changes. Spleen: Within normal limits in size and appearance. Adrenals/Urinary Tract: No masses identified. No evidence of hydronephrosis. Stomach/Bowel: Diffuse colitis shows mild improvement since previous study. No evidence of small bowel involvement. No evidence of abscess or fistula. Vascular/Lymphatic: No pathologically enlarged lymph nodes. No abdominal aortic aneurysm. Acute  DVT is seen in the left common and internal iliac vein, new since prior exam. Reproductive:  No mass or other significant abnormality. Other:  None. Musculoskeletal:  No suspicious bone lesions identified. IMPRESSION: 1. Mild improvement in diffuse colitis since previous study. No evidence of abscess or fistula. 2. Acute DVT in left common and internal iliac veins, new since prior exam. These results will be called to the ordering clinician or representative by the Radiologist Assistant, and communication documented in the PACS or zVision Dashboard. Electronically Signed   By: JEarle GellM.D.   On: 10/31/2018 10:21    Procedures .Critical Care Performed by: CJola Schmidt MD Authorized by: CJola Schmidt MD   Critical care provider statement:    Critical care time (minutes):  31   Critical care was time spent personally by me on the following activities:  Discussions with consultants, evaluation of patient's response to treatment, examination of patient, ordering and performing treatments and interventions, ordering and review of laboratory studies, ordering and review of radiographic studies, pulse oximetry, re-evaluation of patient's condition, obtaining history from patient or surrogate and review of old charts   (including critical care time)  Medications Ordered in ED Medications  sodium chloride 0.9 % bolus 1,000 mL (1,000 mLs Intravenous New Bag/Given 10/31/18 1019)     Initial Impression / Assessment and Plan / ED Course  I have reviewed the triage vital signs and the nursing notes.  Pertinent labs & imaging results that were available during my care of the patient were reviewed by me and considered in my medical decision making (see chart for details).       No acute DVT noted on CT imaging today.  Given ongoing rectal bleeding and ulcerative colitis flare she is not a great candidate for systemic anticoagulation.  She will likely need IVC filter.  She may tolerate  anticoagulation as her hemoglobin is stable at this time but this will be best managed in  the hospital with consultation with gastroenterology, vascular surgery, hospitalist service   11:58 AM Spoke with Dr Donnetta Hutching, vascular, who will see in consultation for possible IVC filter and additional recommendations  Spoke with Azucena Freed, GI who will see in consultation regarding the ulcerative colitis flare and fever to 6 yesterday  Admit to hospitalist.  Patient being taken to the OR at this time for IVC filter placement with vascular surgery  Final Clinical Impressions(s) / ED Diagnoses   Final diagnoses:  Ulcerative colitis with complication, unspecified location [facility])  Acute deep vein thrombosis (DVT) of iliac vein of left lower extremity [facility])    ED Discharge Orders    None       Jola Schmidt, MD 11/03/18 [phone]

## 2018-10-31 NOTE — Telephone Encounter (Signed)
With refractory ulcerative colitis and significant fever, the patient needs to go to the hospital ASAP for admission. Please notify the inpatient GI team, who will consult. thanks

## 2018-10-31 NOTE — Progress Notes (Signed)
LE venous duplex       has been completed. Preliminary results can be found under CV proc through chart review. June Leap, BS, RDMS, RVT  Positive results for bilateral calf DVT discussed with Dr. Venora Maples.

## 2018-10-31 NOTE — Telephone Encounter (Signed)
Radiology called to let us know that the CT ABD PELVIS  W CONTRAST  Report is in.

## 2018-10-31 NOTE — Op Note (Signed)
    OPERATIVE REPORT  DATE OF SURGERY: 10/31/2018  PATIENT: Monica Ortega, 28 y.o. female MRN: [id number]  DOB: 03/15/1991  PRE-OPERATIVE DIAGNOSIS: Left common iliac, internal iliac and bilateral calf vein DVT  POST-OPERATIVE DIAGNOSIS:  Same  PROCEDURE: Cook Celect vena cava filter, femoral approach  SURGEON:  Curt Jews, M.D.  PHYSICIAN ASSISTANT: Nurse  ANESTHESIA: Local with sedation  EBL: per anesthesia record  Total I/O In: 1406.5 [I.V.:406.5; IV Piggyback:1000] Out: 2 [Blood:2]  BLOOD ADMINISTERED: none  DRAINS: none  SPECIMEN: none  COUNTS CORRECT:  YES  PATIENT DISPOSITION:  PACU - hemodynamically stable  PROCEDURE DETAILS: Patient was taken operating placed supine position where the area of the right groin prepped draped you sterile fashion.  Using SonoSite visualization and local anesthesia the right common femoral artery was entered with a singlewall stick and a guidewire was passed centrally and this was confirmed with fluoroscopy.  A dilator was passed over the guidewire and the vena cava filter sheath was positioned over the guidewire to the level of L3.  The dilator was removed and the filter was placed through the sheath and the top was positioned at the level of the top of L3.  A hand-injection of contrast was used prior to placing the filter to confirm that this was the vena cava.  The filter was deployed and the sheath was removed.  Hemostasis obtained with pressure.  The patient was transferred to the recovery room in stable condition   Rosetta Posner, M.D., [facility] 10/31/2018 3:24 PM

## 2018-10-31 NOTE — Telephone Encounter (Signed)
Spoke with patient's sister [name] and reported that the patient should go to the ED. [name] reported to this RN that the patient was currently in CT. This RN reported to the sister that when the CT was completed, to go to the ED for admission.

## 2018-10-31 NOTE — Anesthesia Procedure Notes (Signed)
Procedure Name: MAC Date/Time: 10/31/2018 2:25 PM Performed by: Neldon Newport, CRNA Pre-anesthesia Checklist: Timeout performed, Patient being monitored, Suction available, Emergency Drugs available and Patient identified Patient Re-evaluated:Patient Re-evaluated prior to induction Oxygen Delivery Method: Simple face mask

## 2018-11-01 ENCOUNTER — Encounter (HOSPITAL_COMMUNITY)
Admission: EM | Disposition: A | Discharge status: Home / Self Care | Payer: Self-pay | Source: Ambulatory Visit | Attending: Internal Medicine

## 2018-11-01 ENCOUNTER — Inpatient Hospital Stay (HOSPITAL_COMMUNITY): Payer: BLUE CROSS/BLUE SHIELD | Admitting: Critical Care Medicine

## 2018-11-01 ENCOUNTER — Ambulatory Visit: Payer: BLUE CROSS/BLUE SHIELD | Admitting: Psychology

## 2018-11-01 ENCOUNTER — Ambulatory Visit: Payer: Self-pay | Admitting: Internal Medicine

## 2018-11-01 ENCOUNTER — Encounter (HOSPITAL_COMMUNITY): Payer: Self-pay | Admitting: Vascular Surgery

## 2018-11-01 DIAGNOSIS — I82423 Acute embolism and thrombosis of iliac vein, bilateral: Secondary | ICD-10-CM

## 2018-11-01 DIAGNOSIS — K529 Noninfective gastroenteritis and colitis, unspecified: Secondary | ICD-10-CM | POA: Diagnosis not present

## 2018-11-01 HISTORY — PX: COLONOSCOPY WITH PROPOFOL: SHX5780

## 2018-11-01 HISTORY — PX: BIOPSY: SHX5522

## 2018-11-01 LAB — CBC
HCT: 24.6 % — ABNORMAL LOW (ref 36.0–46.0)
Hemoglobin: 7.6 g/dL — ABNORMAL LOW (ref 12.0–15.0)
MCH: 25.5 pg — ABNORMAL LOW (ref 26.0–34.0)
MCHC: 30.9 g/dL (ref 30.0–36.0)
MCV: 82.6 fL (ref 80.0–100.0)
Platelets: 240 10*3/uL (ref 150–400)
RBC: 2.98 MIL/uL — ABNORMAL LOW (ref 3.87–5.11)
RDW: 13.2 % (ref 11.5–15.5)
WBC: 5.6 10*3/uL (ref 4.0–10.5)
nRBC: 0 % (ref 0.0–0.2)

## 2018-11-01 LAB — COMPREHENSIVE METABOLIC PANEL
ALT: 16 U/L (ref 0–44)
AST: 10 U/L — ABNORMAL LOW (ref 15–41)
Albumin: 1.9 g/dL — ABNORMAL LOW (ref 3.5–5.0)
Alkaline Phosphatase: 50 U/L (ref 38–126)
Anion gap: 9 (ref 5–15)
BUN: <5 mg/dL — ABNORMAL LOW (ref 6–20)
CO2: 23 mmol/L (ref 22–32)
Calcium: 7.8 mg/dL — ABNORMAL LOW (ref 8.9–10.3)
Chloride: 105 mmol/L (ref 98–111)
Creatinine, Ser: 0.53 mg/dL (ref 0.44–1.00)
GFR calc Af Amer: >60 mL/min (ref >60)
GFR calc non Af Amer: >60 mL/min (ref >60)
Glucose, Bld: 196 mg/dL — ABNORMAL HIGH (ref 70–99)
Potassium: 3.7 mmol/L (ref 3.5–5.1)
Sodium: 137 mmol/L (ref 135–145)
Total Bilirubin: 0.2 mg/dL — ABNORMAL LOW (ref 0.3–1.2)
Total Protein: 5 g/dL — ABNORMAL LOW (ref 6.5–8.1)

## 2018-11-01 LAB — ABO/RH: ABO/RH(D): AB POS

## 2018-11-01 LAB — VITAMIN B12: Vitamin B-12: 1013 pg/mL — ABNORMAL HIGH (ref 180–914)

## 2018-11-01 LAB — TYPE AND SCREEN
ABO/RH(D): AB POS
Antibody Screen: NEGATIVE

## 2018-11-01 LAB — C-REACTIVE PROTEIN: CRP: 15.9 mg/dL — ABNORMAL HIGH (ref <1.0)

## 2018-11-01 LAB — FERRITIN: Ferritin: 68 ng/mL (ref 11–307)

## 2018-11-01 LAB — CLOSTRIDIUM DIFFICILE TOXIN B, QUALITATIVE, REAL-TIME PCR: Toxigenic C. Difficile by PCR: NOT DETECTED

## 2018-11-01 SURGERY — COLONOSCOPY WITH PROPOFOL
Anesthesia: Monitor Anesthesia Care

## 2018-11-01 MED ORDER — LACTATED RINGERS IV SOLN: INTRAVENOUS | Status: DC

## 2018-11-01 MED ORDER — PROPOFOL 500 MG/50ML IV EMUL
INTRAVENOUS | Status: DC | PRN
  Administered 2018-11-01: 125 ug/kg/min via INTRAVENOUS

## 2018-11-01 MED ORDER — LACTATED RINGERS IV SOLN
INTRAVENOUS | Status: DC | PRN
  Administered 2018-11-01: 15:00:00 via INTRAVENOUS

## 2018-11-01 MED ORDER — SODIUM CHLORIDE 0.9 % IV SOLN
510.0000 mg | Freq: Once | INTRAVENOUS | Status: AC
  Administered 2018-11-01: 510 mg via INTRAVENOUS
  Filled 2018-11-01: qty 17

## 2018-11-01 MED ORDER — PROPOFOL 10 MG/ML IV BOLUS
INTRAVENOUS | Status: DC | PRN
  Administered 2018-11-01: 50 mg via INTRAVENOUS

## 2018-11-01 MED ORDER — LIDOCAINE 2% (20 MG/ML) 5 ML SYRINGE
INTRAMUSCULAR | Status: DC | PRN
  Administered 2018-11-01: 100 mg via INTRAVENOUS

## 2018-11-01 SURGICAL SUPPLY — 22 items

## 2018-11-01 NOTE — Interval H&P Note (Signed)
History and Physical Interval Note:  11/01/2018 2:35 PM  Monica Ortega  has presented today for surgery, with the diagnosis of Ulcerative colitis.  The various methods of treatment have been discussed with the patient and family. After consideration of risks, benefits and other options for treatment, the patient has consented to  Procedure(s): COLONOSCOPY WITH PROPOFOL (N/A) as a surgical intervention.  The patient's history has been reviewed, patient examined, no change in status, stable for surgery.  I have reviewed the patient's chart and labs.  Questions were answered to the patient's satisfaction.     Silvano Rusk

## 2018-11-01 NOTE — Anesthesia Preprocedure Evaluation (Signed)
Anesthesia Evaluation  Patient identified by MRN, date of birth, ID band Patient awake    Reviewed: Allergy & Precautions, NPO status , Patient's Chart, lab work & pertinent test results  History of Anesthesia Complications Negative for: history of anesthetic complications  Airway Mallampati: I  TM Distance: >3 FB Neck ROM: Full    Dental no notable dental hx. (+) Teeth Intact   Pulmonary neg pulmonary ROS,    Pulmonary exam normal        Cardiovascular + DVT  Normal cardiovascular exam     Neuro/Psych negative neurological ROS  negative psych ROS   GI/Hepatic Neg liver ROS, Ulcerative colitis   Endo/Other  negative endocrine ROS  Renal/GU negative Renal ROS  negative genitourinary   Musculoskeletal negative musculoskeletal ROS (+)   Abdominal   Peds  Hematology  (+) anemia ,   Anesthesia Other Findings   Reproductive/Obstetrics                             Anesthesia Physical Anesthesia Plan  ASA: II  Anesthesia Plan: MAC   Post-op Pain Management:    Induction:   PONV Risk Score and Plan: 2 and Propofol infusion and Treatment may vary due to age or medical condition  Airway Management Planned: Natural Airway and Simple Face Mask  Additional Equipment: None  Intra-op Plan:   Post-operative Plan:   Informed Consent: I have reviewed the patients History and Physical, chart, labs and discussed the procedure including the risks, benefits and alternatives for the proposed anesthesia with the patient or authorized representative who has indicated his/her understanding and acceptance.       Plan Discussed with:   Anesthesia Plan Comments:         Anesthesia Quick Evaluation

## 2018-11-01 NOTE — Progress Notes (Addendum)
[facility]  Subjective  - Comfortable after surgery.   Objective 111/66 82 97.9 F (36.6 C) (Oral) 13 97%  Intake/Output Summary (Last 24 hours) at 11/01/2018 0850 Last data filed at 11/01/2018 0755 Gross per 24 hour  Intake 2941.4 ml  Output 2 ml  Net 2939.4 ml    Right groin soft without hematoma. DP pulses palpable  Assessment/Planning: POD # 1 IVC filter placed  Stable disposition Pending colonoscopy today Recommend anticoagulation when safe per GI.  Roxy Horseman 11/01/2018 8:50 AM --  Laboratory Lab Results: Recent Labs    10/31/18 1656 11/01/18 0242  WBC 8.3 5.6  HGB 8.1* 7.6*  HCT 25.9* 24.6*  PLT 203 240   BMET Recent Labs    10/31/18 1006 11/01/18 0242  NA 132* 137  K 3.1* 3.7  CL 96* 105  CO2 24 23  GLUCOSE 150* 196*  BUN 6 <5*  CREATININE 0.85 0.53  CALCIUM 8.3* 7.8*    COAG No results found for: INR, PROTIME No results found for: PTT   I have independently interviewed and examined patient and agree with PA assessment and plan above.  Has filter to protect her from her DVT.  If becomes safe for anticoagulation she should get at least 3 months given high risk of blood clot with ulcerative colitis.  We will have her follow-up in the office with Dr. Donnetta Hutching to consider IVC filter removal.  Erlene Quan C. Donzetta Matters, MD [facility] [place] Office: [phone] Pager: [phone]

## 2018-11-01 NOTE — Plan of Care (Signed)
Patient progressing toward care goals.  Patient scheduled for colonoscopy this afternoon to evaluate status of ulcerative colitis.  Overnight prep has been administered and patient is now w/watery stool.  Bowel sounds are hypoactive and faint but audible.   Patient c/o mild abdominal pain 3-4/10 but requests no pain medication. Patient is quiet w/flat affect but cooperative and answers all questions.  Will continue to monitor overall progress.

## 2018-11-01 NOTE — Progress Notes (Signed)
[facility] Progress Note  Patient: Monica Ortega OIZ:[id number]   PCP: [name], DO DOB: 1991/06/23   DOA: 10/31/2018   DOS: 11/01/2018   Date of Service: the patient was seen and examined on 11/01/2018  Brief hospital course: Pt. with PMH of ulcerative colitis, depression admitted on 10/31/2018, presented with complaint of abdominal pain and rectal bleeding, was found to have acute flareup of ulcerative colitis and DVT. Currently further plan is continue current plan.  Subjective: Denies any acute complaint.  Mild abdominal discomfort is still present.  No nausea no vomiting.  No further blood in the stool.  No blood in the vomitus reported.  No further fever in the hospital.  Assessment and Plan: 1. Acute extensive bilateral DVTs involving left common iliac vein Rectal bleeding. S/P IVC filter placement on 10/31/2018 by vascular surgery.  Presents with fever and ongoing rectal bleeding. Due to fatigue underwent CT abdomen pelvis which was positive for occlusive thrombus. Lower extremity venous Doppler was also positive for DVT. Vascular surgery consulted S/P inferior vena cava filter placement Ideally vascular surgery would recommend anticoagulation whenever GI feels it is appropriate.  2.  Acute on chronic ulcerative colitis Not responding to high-dose steroid therapy with still mild. S/P colonoscopy 11/01/2018. CRP elevated. GI pathogen panel as well as C. difficile toxin negative. Biopsy performed. GI thinks that the patient will require Remicade but currently treating with IV Solu-Medrol.  3.  Acute blood loss anemia. Rectal bleeding. H&H dropped from admission likely secondary to dilution. No further bleeding here reported. We will continue to monitor H&H for now. Transfuse for hemoglobin less than 7.  4. Hypokalemia. Replaced. Monitor.  5. Bandemia.,  Fever Likely secondary to steroid use and acute flareup of gastroparesis. Monitor for active  infection. Patient did have some fever at home but no further fever here in the hospital. Infectious work-up reportedly outpatient was also negative.  6.  Hyperglycemia. Reportedly patient does have history of hyperglycemia but A1c has always remained consistently under control. Do not think the patient actually has diabetes. Currently on steroids which will require insulin sensitive sliding scale. Monitor.  Diet: per GI DVT Prophylaxis: SCD  Advance goals of care discussion: full code  Family Communication: no family was present at bedside, at the time of interview.   Disposition:  Discharge to home tomorrow.  Consultants: gastroenterology vascula surgery Procedures: colonoscopy   Scheduled Meds: . mesalamine  4.8 g Oral Q breakfast  . methylPREDNISolone (SOLU-MEDROL) injection  40 mg Intravenous Q12H  . pantoprazole (PROTONIX) IV  40 mg Intravenous Q12H   Continuous Infusions: . sodium chloride 125 mL/hr at 11/01/18 0805   PRN Meds: acetaminophen, dicyclomine, HYDROcodone-acetaminophen, loperamide, morphine injection, ondansetron **OR** ondansetron (ZOFRAN) IV, promethazine, traMADol Antibiotics: Anti-infectives (From admission, onward)   Start     Dose/Rate Route Frequency Ordered Stop   10/31/18 1405  ceFAZolin (ANCEF) 2-4 GM/100ML-% IVPB    Note to Pharmacy:  Elige Ko   : cabinet override      10/31/18 1405 11/01/18 0214       Objective: Physical Exam: Vitals:   11/01/18 1509 11/01/18 1520 11/01/18 1530 11/01/18 1540  BP: 108/63 110/61 108/64 (!) 109/53  Pulse: 88 72 81 91  Resp: (!) 22 (!) 21 20 (!) 21  Temp: 97.9 F (36.6 C)     TempSrc: Oral     SpO2: 99% 100% 100% 100%  Weight:      Height:        Intake/Output Summary (Last  24 hours) at 11/01/2018 1553 Last data filed at 11/01/2018 1503 Gross per 24 hour  Intake 1634.94 ml  Output -  Net 1634.94 ml   Filed Weights   10/31/18 0958  Weight: 68 kg   General: Alert, Awake and Oriented to  Time, Place and Person. Appear in mild distress, affect appropriate Eyes: PERRL, Conjunctiva normal ENT: Oral Mucosa clear moist. Neck: no JVD, no Abnormal Mass Or lumps Cardiovascular: S1 and S2 Present, no Murmur, Peripheral Pulses Present Respiratory: normal respiratory effort, Bilateral Air entry equal and Decreased, no use of accessory muscle, Clear to Auscultation, no Crackles, no wheezes Abdomen: Bowel Sound present, Soft and no tenderness, no hernia Skin: no redness, no Rash, non induration Extremities: ono Pedal edema, no calf tenderness Neurologic: Grossly no focal neuro deficit. Bilaterally Equal motor strength  Data Reviewed: CBC: Recent Labs  Lab 10/30/18 1221 10/31/18 1006 10/31/18 1656 11/01/18 0242  WBC 10.4 8.9 8.3 5.6  NEUTROABS 9.5* 6.5  --   --   HGB 9.9* 9.3* 8.1* 7.6*  HCT 30.1* 30.6* 25.9* 24.6*  MCV 79.9 85.0 83.8 82.6  PLT 268.0 241 203 469   Basic Metabolic Panel: Recent Labs  Lab 10/30/18 1221 10/31/18 1006 11/01/18 0242  NA 134* 132* 137  K 3.6 3.1* 3.7  CL 98 96* 105  CO2 29 24 23   GLUCOSE 166* 150* 196*  BUN 8 6 <5*  CREATININE 0.70 0.85 0.53  CALCIUM 8.6 8.3* 7.8*    Liver Function Tests: Recent Labs  Lab 10/30/18 1221 10/31/18 1006 11/01/18 0242  AST 16 16 10*  ALT 19 21 16   ALKPHOS 70 62 50  BILITOT 0.3 0.4 0.2*  PROT 6.3 5.8* 5.0*  ALBUMIN 3.1* 2.4* 1.9*   Recent Labs  Lab 10/31/18 1006  LIPASE 63*   No results for input(s): AMMONIA in the last 168 hours. Coagulation Profile: No results for input(s): INR, PROTIME in the last 168 hours. Cardiac Enzymes: No results for input(s): CKTOTAL, CKMB, CKMBINDEX, TROPONINI in the last 168 hours. BNP (last 3 results) No results for input(s): PROBNP in the last 8760 hours. CBG: No results for input(s): GLUCAP in the last 168 hours. Studies: No results found.   Time spent: 35 minutes  Author: Berle Mull, MD [facility] 11/01/2018 3:53 PM  To reach On-call, see  care teams to locate the attending and reach out to them via [email or web address]. If 7PM-7AM, please contact night-coverage If you still have difficulty reaching the attending provider, please page the [facility] (Director on Call) for [facility] on amion for assistance.

## 2018-11-01 NOTE — Progress Notes (Signed)
Pt transported to endoscopy suite per wheelchair.  Await return.

## 2018-11-01 NOTE — Progress Notes (Signed)
Inpatient Diabetes Program Recommendations  AACE/ADA: New Consensus Statement on Inpatient Glycemic Control   Target Ranges:  Prepandial:   less than 140 mg/dL      Peak postprandial:   less than 180 mg/dL (1-2 hours)      Critically ill patients:  140 - 180 mg/dL  Results for Monica Ortega, Monica "ANNIE" (MRN [id number]) as of 11/01/2018 10:30  Ref. Range 10/30/2018 12:21 10/31/2018 10:06 11/01/2018 02:42  Glucose Latest Ref Range: 70 - 99 mg/dL 166 (H) 150 (H) 196 (H)   Results for Monica Ortega, Monica "ANNIE" (MRN [id number]) as of 11/01/2018 10:30  Ref. Range 02/19/2018 11:03  Hemoglobin A1C Latest Ref Range: 4.6 - 6.5 % 5.7   Review of Glycemic Control  Diabetes history: No Outpatient Diabetes medications: NA Current orders for Inpatient glycemic control: None; Solumedrol 40 mg Q12H  Inpatient Diabetes Program Recommendations:   Correction (SSI): While inpatient and ordered steroids, please consider ordering CBGs ACHS with Novolog 0-9 units TID with meals and Novolog 0-5 units QHS.  HbgA1C: Please consider ordering an A1C to evaluate glycemic control over the past 2-3 months.  Thanks, Barnie Alderman, RN, MSN, CDE Diabetes Coordinator Inpatient Diabetes Program [phone] (Team Pager from 8am to 5pm)

## 2018-11-01 NOTE — Op Note (Signed)
[facility] Patient Name: Monica Ortega Procedure Date : 11/01/2018 MRN: [id number] Attending MD: Gatha Mayer , MD Date of Birth: 1990/09/09 CSN: [id number] Age: 28 Admit Type: Inpatient Procedure:                Colonoscopy Indications:              Chronic ulcerative pancolitis, Follow-up of chronic                            ulcerative pancolitis, Disease activity assessment                            of chronic ulcerative pancolitis, Assess                            therapeutic response to therapy of chronic                            ulcerative pancolitis Providers:                Gatha Mayer, MD, Glori Bickers, RN, Jobe Igo, RN, Elspeth Cho Tech., Technician,                            Ladona Ridgel, Technician, Lance Coon, CRNA Referring MD:              Medicines:                Monitored Anesthesia Care Complications:            No immediate complications. Estimated Blood Loss:     Estimated blood loss was minimal. Procedure:                Pre-Anesthesia Assessment:                           - Prior to the procedure, a History and Physical                            was performed, and patient medications and                            allergies were reviewed. The patient's tolerance of                            previous anesthesia was also reviewed. The risks                            and benefits of the procedure and the sedation                            options and risks were discussed with the patient.                            All questions were  answered, and informed consent                            was obtained. Prior Anticoagulants: The patient has                            taken no previous anticoagulant or antiplatelet                            agents. ASA Grade Assessment: II - A patient with                            mild systemic disease. After reviewing the risks   and benefits, the patient was deemed in                            satisfactory condition to undergo the procedure.                           After obtaining informed consent, the colonoscope                            was passed under direct vision. Throughout the                            procedure, the patient's blood pressure, pulse, and                            oxygen saturations were monitored continuously. The                            CF-HQ190L ([id number]) Olympus colonoscope was                            introduced through the anus with the intention of                            advancing to the cecum. The scope was advanced to                            the splenic flexure before the procedure was                            aborted. Medications were given. The colonoscopy                            was performed with difficulty due to inflammation                            and stenosis at splenic flexure. The patient                            tolerated the procedure well. The quality of the  bowel preparation was excellent. The rectum was                            photographed. Scope In: 2:55:30 PM Scope Out: 3:02:10 PM Total Procedure Duration: 0 hours 6 minutes 40 seconds  Findings:      The perianal and digital rectal examinations were normal.      Inflammation characterized by congestion (edema), erosions, erythema,       friability, granularity, mucus, deep ulcerations and serpentine       ulcerations was found in a continuous and circumferential pattern from       the rectum to the splenic flexure. No sites were spared. This was       severe. Biopsies were taken with a cold forceps for histology.       Verification of patient identification for the specimen was done.       Estimated blood loss was minimal. Impression:               - Pancolitis ulcerative colitis. Inflammation was                            found from the rectum to the  splenic flexure. This                            was severe. Biopsied. THE SPLENIC FLEXURE REGION                            WAS VERY EDEMATOUS AND ANGULATED SO I DECIDED NOT                            TO ADVANCE FURTHER.                           THE RECTUM AND DISTAL SIGMOID SLIGHTLY LESS                            INFLAMED - COULD HAVE BEEN ROM ROWASA EFFECT Recommendation:           - Return patient to hospital ward for ongoing care.                           - Soft diet.                           - Continue present medications.                           - She will need Remicade I think                           Have started disscussion with her.                           Await path - doubt CMV in her case but will see  biopsies and then likely start Remicade                           Feraheme x 1 - ferritin 68 but should be much                            higher with inflammation and Hgb 7                           followCRP Procedure Code(s):        --- Professional ---                           [phone], 52, Colonoscopy, flexible; with biopsy,                            single or multiple Diagnosis Code(s):        --- Professional ---                           K51.00, Ulcerative (chronic) pancolitis without                            complications CPT copyright 2019 American Medical Association. All rights reserved. The codes documented in this report are preliminary and upon coder review may  be revised to meet current compliance requirements. Gatha Mayer, MD 11/01/2018 3:19:53 PM This report has been signed electronically. Number of Addenda: 0

## 2018-11-01 NOTE — Transfer of Care (Signed)
Immediate Anesthesia Transfer of Care Note  Patient: Monica Ortega  Procedure(s) Performed: COLONOSCOPY WITH PROPOFOL (N/A ) BIOPSY  Patient Location: Endoscopy Unit  Anesthesia Type:MAC  Level of Consciousness: awake and patient cooperative  Airway & Oxygen Therapy: Patient Spontanous Breathing  Post-op Assessment: Report given to RN and Post -op Vital signs reviewed and stable  Post vital signs: Reviewed and stable  Last Vitals:  Vitals Value Taken Time  BP 108/63 11/01/2018  3:08 PM  Temp    Pulse 88 11/01/2018  3:09 PM  Resp 22 11/01/2018  3:09 PM  SpO2 99 % 11/01/2018  3:09 PM  Vitals shown include unvalidated device data.  Last Pain:  Vitals:   11/01/18 1400  TempSrc: Oral  PainSc: 0-No pain      Patients Stated Pain Goal: 3 (89/37/34 2876)  Complications: No apparent anesthesia complications

## 2018-11-01 NOTE — Anesthesia Postprocedure Evaluation (Signed)
Anesthesia Post Note  Patient: Monica Ortega  Procedure(s) Performed: COLONOSCOPY WITH PROPOFOL (N/A ) BIOPSY     Patient location during evaluation: PACU Anesthesia Type: MAC Level of consciousness: awake and alert Pain management: pain level controlled Vital Signs Assessment: post-procedure vital signs reviewed and stable Respiratory status: spontaneous breathing, nonlabored ventilation and respiratory function stable Cardiovascular status: blood pressure returned to baseline and stable Postop Assessment: no apparent nausea or vomiting Anesthetic complications: no    Last Vitals:  Vitals:   11/01/18 1509 11/01/18 1520  BP: 108/63 110/61  Pulse: 88 72  Resp: (!) 22 (!) 21  Temp: 36.6 C   SpO2: 99% 100%    Last Pain:  Vitals:   11/01/18 1520  TempSrc:   PainSc: 0-No pain                 Lidia Collum

## 2018-11-02 ENCOUNTER — Encounter (HOSPITAL_COMMUNITY): Payer: Self-pay | Admitting: Vascular Surgery

## 2018-11-02 LAB — CBC
HCT: 24.7 % — ABNORMAL LOW (ref 36.0–46.0)
Hemoglobin: 7.7 g/dL — ABNORMAL LOW (ref 12.0–15.0)
MCH: 26 pg (ref 26.0–34.0)
MCHC: 31.2 g/dL (ref 30.0–36.0)
MCV: 83.4 fL (ref 80.0–100.0)
Platelets: 281 10*3/uL (ref 150–400)
RBC: 2.96 MIL/uL — ABNORMAL LOW (ref 3.87–5.11)
RDW: 13.4 % (ref 11.5–15.5)
WBC: 7.7 10*3/uL (ref 4.0–10.5)
nRBC: 0.4 % — ABNORMAL HIGH (ref 0.0–0.2)

## 2018-11-02 LAB — COMPREHENSIVE METABOLIC PANEL
ALT: 15 U/L (ref 0–44)
AST: 9 U/L — ABNORMAL LOW (ref 15–41)
Albumin: 2 g/dL — ABNORMAL LOW (ref 3.5–5.0)
Alkaline Phosphatase: 45 U/L (ref 38–126)
Anion gap: 8 (ref 5–15)
BUN: 6 mg/dL (ref 6–20)
CO2: 24 mmol/L (ref 22–32)
Calcium: 8.3 mg/dL — ABNORMAL LOW (ref 8.9–10.3)
Chloride: 109 mmol/L (ref 98–111)
Creatinine, Ser: 0.65 mg/dL (ref 0.44–1.00)
GFR calc Af Amer: >60 mL/min (ref >60)
GFR calc non Af Amer: >60 mL/min (ref >60)
Glucose, Bld: 195 mg/dL — ABNORMAL HIGH (ref 70–99)
Potassium: 3.7 mmol/L (ref 3.5–5.1)
Sodium: 141 mmol/L (ref 135–145)
Total Bilirubin: 0.5 mg/dL (ref 0.3–1.2)
Total Protein: 4.6 g/dL — ABNORMAL LOW (ref 6.5–8.1)

## 2018-11-02 LAB — C-REACTIVE PROTEIN: CRP: 5.4 mg/dL — ABNORMAL HIGH (ref <1.0)

## 2018-11-02 LAB — PREALBUMIN: Prealbumin: 16.5 mg/dL — ABNORMAL LOW (ref 18–38)

## 2018-11-02 MED ORDER — SODIUM CHLORIDE 0.9 % IV SOLN
300.0000 mg | Freq: Once | INTRAVENOUS | Status: AC
  Administered 2018-11-02: 300 mg via INTRAVENOUS
  Filled 2018-11-02: qty 30

## 2018-11-02 MED ORDER — METHYLPREDNISOLONE SODIUM SUCC 40 MG IJ SOLR
40.0000 mg | Freq: Every day | INTRAMUSCULAR | Status: DC
  Administered 2018-11-03: 40 mg via INTRAVENOUS
  Filled 2018-11-02: qty 1

## 2018-11-02 MED ORDER — GLUCERNA SHAKE PO LIQD
237.0000 mL | Freq: Two times a day (BID) | ORAL | Status: DC
  Administered 2018-11-02 (×2): 237 mL via ORAL

## 2018-11-02 MED ORDER — PANTOPRAZOLE SODIUM 40 MG PO TBEC
40.0000 mg | DELAYED_RELEASE_TABLET | Freq: Every day | ORAL | Status: DC
  Administered 2018-11-03: 40 mg via ORAL
  Filled 2018-11-02: qty 1

## 2018-11-02 NOTE — Anesthesia Postprocedure Evaluation (Signed)
Anesthesia Post Note  Patient: Monica Ortega  Procedure(s) Performed: INSERTION VENA-CAVA FILTER (N/A )     Patient location during evaluation: PACU Anesthesia Type: MAC Level of consciousness: awake and alert Pain management: pain level controlled Vital Signs Assessment: post-procedure vital signs reviewed and stable Respiratory status: spontaneous breathing, nonlabored ventilation, respiratory function stable and patient connected to nasal cannula oxygen Cardiovascular status: stable and blood pressure returned to baseline Postop Assessment: no apparent nausea or vomiting Anesthetic complications: no    Last Vitals:  Vitals:   11/02/18 0300 11/02/18 0425  BP:  (!) 105/58  Pulse: 69 71  Resp: 18 18  Temp:  36.7 C  SpO2: 99% 99%    Last Pain:  Vitals:   11/02/18 0425  TempSrc: Oral  PainSc:    Pain Goal: Patients Stated Pain Goal: 3 (11/01/18 0700)                 HODIERNE,ADAM S

## 2018-11-02 NOTE — Progress Notes (Signed)
[facility] Progress Note  Patient: Monica Ortega EXH:[id number]   PCP: [name], DO DOB: 1990/11/03   DOA: 10/31/2018   DOS: 11/02/2018   Date of Service: the patient was seen and examined on 11/02/2018  Brief hospital course: Pt. with PMH of ulcerative colitis, depression admitted on 10/31/2018, presented with complaint of abdominal pain and rectal bleeding, was found to have acute flareup of ulcerative colitis and DVT. Currently further plan is continue current plan.  Subjective: No acute complaint no nausea no vomiting no fever no chills no chest pain abdominal pain.  No further diarrhea.  Tolerating oral diet.  Assessment and Plan: 1. Acute extensive bilateral DVTs involving left common iliac vein Rectal bleeding. S/P IVC filter placement on 10/31/2018 by vascular surgery.  Presents with fever and ongoing rectal bleeding. Due to fatigue underwent CT abdomen pelvis which was positive for occlusive thrombus. Lower extremity venous Doppler was also positive for DVT. Vascular surgery consulted S/P inferior vena cava filter placement Ideally vascular surgery would recommend anticoagulation whenever GI feels it is appropriate.  2.  Acute on chronic ulcerative colitis Not responding to high-dose steroid therapy with still mild. S/P colonoscopy 11/01/2018. CRP elevated. GI pathogen panel as well as C. difficile toxin negative. Biopsy performed. GI started the patient on IV Remicade.  Will reduce the dose of Solu-Medrol.  3.  Acute blood loss anemia. Rectal bleeding. H&H dropped from admission likely secondary to dilution. No further bleeding here reported. We will continue to monitor H&H for now. Transfuse for hemoglobin less than 7.  4. Hypokalemia. Replaced. Monitor.  5. Bandemia.,  Fever Likely secondary to steroid use and acute flareup of gastroparesis. Monitor for active infection. Patient did have some fever at home but no further fever here in the  hospital. Infectious work-up reportedly outpatient was also negative.  6.  Hyperglycemia. Reportedly patient does have history of hyperglycemia but A1c has always remained consistently under control. Do not think the patient actually has diabetes. Currently on steroids which will require insulin sensitive sliding scale. Monitor.  Diet: per GI DVT Prophylaxis: SCD  Advance goals of care discussion: full code  Family Communication: no family was present at bedside, at the time of interview.   Disposition:  Discharge to home tomorrow.  Consultants: gastroenterology vascula surgery Procedures: colonoscopy   Scheduled Meds: . feeding supplement (GLUCERNA SHAKE)  237 mL Oral BID BM  . mesalamine  4.8 g Oral Q breakfast  . [START ON 11/03/2018] methylPREDNISolone (SOLU-MEDROL) injection  40 mg Intravenous Daily  . [START ON 11/03/2018] pantoprazole  40 mg Oral QAC breakfast   Continuous Infusions: . sodium chloride 50 mL/hr at 11/02/18 0300   PRN Meds: acetaminophen, dicyclomine, HYDROcodone-acetaminophen, loperamide, morphine injection, ondansetron **OR** ondansetron (ZOFRAN) IV, promethazine, traMADol Antibiotics: Anti-infectives (From admission, onward)   Start     Dose/Rate Route Frequency Ordered Stop   10/31/18 1405  ceFAZolin (ANCEF) 2-4 GM/100ML-% IVPB    Note to Pharmacy:  Elige Ko   : cabinet override      10/31/18 1405 11/01/18 0214       Objective: Physical Exam: Vitals:   11/02/18 0425 11/02/18 1059 11/02/18 1700 11/02/18 1805  BP: (!) 105/58 115/73 120/69 120/72  Pulse: 71 86 79 78  Resp: 18 18 20 17   Temp: 98 F (36.7 C) 97.7 F (36.5 C) 98 F (36.7 C) 97.8 F (36.6 C)  TempSrc: Oral Oral Oral Oral  SpO2: 99% 98% 98% 99%  Weight:      Height:  Intake/Output Summary (Last 24 hours) at 11/02/2018 1905 Last data filed at 11/02/2018 1700 Gross per 24 hour  Intake 1391.43 ml  Output 0 ml  Net 1391.43 ml   Filed Weights   10/31/18 0958   Weight: 68 kg   General: Alert, Awake and Oriented to Time, Place and Person. Appear in mild distress, affect appropriate Eyes: PERRL, Conjunctiva normal ENT: Oral Mucosa clear moist. Neck: no JVD, no Abnormal Mass Or lumps Cardiovascular: S1 and S2 Present, no Murmur, Peripheral Pulses Present Respiratory: normal respiratory effort, Bilateral Air entry equal and Decreased, no use of accessory muscle, Clear to Auscultation, no Crackles, no wheezes Abdomen: Bowel Sound present, Soft and no tenderness, no hernia Skin: no redness, no Rash, non induration Extremities: ono Pedal edema, no calf tenderness Neurologic: Grossly no focal neuro deficit. Bilaterally Equal motor strength  Data Reviewed: CBC: Recent Labs  Lab 10/30/18 1221 10/31/18 1006 10/31/18 1656 11/01/18 0242 11/02/18 0322  WBC 10.4 8.9 8.3 5.6 7.7  NEUTROABS 9.5* 6.5  --   --   --   HGB 9.9* 9.3* 8.1* 7.6* 7.7*  HCT 30.1* 30.6* 25.9* 24.6* 24.7*  MCV 79.9 85.0 83.8 82.6 83.4  PLT 268.0 241 203 240 840   Basic Metabolic Panel: Recent Labs  Lab 10/30/18 1221 10/31/18 1006 11/01/18 0242 11/02/18 0322  NA 134* 132* 137 141  K 3.6 3.1* 3.7 3.7  CL 98 96* 105 109  CO2 29 24 23 24   GLUCOSE 166* 150* 196* 195*  BUN 8 6 <5* 6  CREATININE 0.70 0.85 0.53 0.65  CALCIUM 8.6 8.3* 7.8* 8.3*    Liver Function Tests: Recent Labs  Lab 10/30/18 1221 10/31/18 1006 11/01/18 0242 11/02/18 0322  AST 16 16 10* 9*  ALT 19 21 16 15   ALKPHOS 70 62 50 45  BILITOT 0.3 0.4 0.2* 0.5  PROT 6.3 5.8* 5.0* 4.6*  ALBUMIN 3.1* 2.4* 1.9* 2.0*   Recent Labs  Lab 10/31/18 1006  LIPASE 63*   No results for input(s): AMMONIA in the last 168 hours. Coagulation Profile: No results for input(s): INR, PROTIME in the last 168 hours. Cardiac Enzymes: No results for input(s): CKTOTAL, CKMB, CKMBINDEX, TROPONINI in the last 168 hours. BNP (last 3 results) No results for input(s): PROBNP in the last 8760 hours. CBG: No results for  input(s): GLUCAP in the last 168 hours. Studies: No results found.   Time spent: 35 minutes  Author: Berle Mull, MD [facility] 11/02/2018 7:05 PM  To reach On-call, see care teams to locate the attending and reach out to them via [email or web address]. If 7PM-7AM, please contact night-coverage If you still have difficulty reaching the attending provider, please page the [facility] (Director on Call) for [facility] on amion for assistance.

## 2018-11-02 NOTE — Anesthesia Preprocedure Evaluation (Signed)
Anesthesia Evaluation  Patient identified by MRN, date of birth, ID band Patient awake    Reviewed: Allergy & Precautions, H&P , NPO status , Patient's Chart, lab work & pertinent test results  Airway Mallampati: I   Neck ROM: full    Dental   Pulmonary neg pulmonary ROS,    breath sounds clear to auscultation       Cardiovascular + DVT   Rhythm:regular Rate:Normal     Neuro/Psych PSYCHIATRIC DISORDERS Anxiety Depression    GI/Hepatic PUD, Ulcerative colitis with active flare. On steroids   Endo/Other    Renal/GU      Musculoskeletal   Abdominal   Peds  Hematology  (+) anemia ,   Anesthesia Other Findings   Reproductive/Obstetrics                             Anesthesia Physical Anesthesia Plan  ASA: II  Anesthesia Plan: MAC   Post-op Pain Management:    Induction: Intravenous  PONV Risk Score and Plan: 2 and Propofol infusion, Ondansetron and Treatment may vary due to age or medical condition  Airway Management Planned: Simple Face Mask  Additional Equipment:   Intra-op Plan:   Post-operative Plan:   Informed Consent: I have reviewed the patients History and Physical, chart, labs and discussed the procedure including the risks, benefits and alternatives for the proposed anesthesia with the patient or authorized representative who has indicated his/her understanding and acceptance.       Plan Discussed with: CRNA, Anesthesiologist and Surgeon  Anesthesia Plan Comments:         Anesthesia Quick Evaluation

## 2018-11-02 NOTE — Progress Notes (Signed)
Initial Nutrition Assessment  RD working remotely.  DOCUMENTATION CODES:   Not applicable, will assess for malnutrition at follow-up after completion of NFPE  INTERVENTION:   - Ensure Enlive po BID, each supplement provides 350 kcal and 20 grams of protein  - d/c Glucerna  NUTRITION DIAGNOSIS:   Increased nutrient needs related to acute illness as evidenced by estimated needs.  GOAL:   Patient will meet greater than or equal to 90% of their needs  MONITOR:   PO intake, Supplement acceptance, Labs, Weight trends, Skin  REASON FOR ASSESSMENT:   Consult Assessment of nutrition requirement/status  ASSESSMENT:   28 year old female who presented to the ED on 4/22 with abdominal pain and fever. PMH of ulcerative colitis, depression, DVT.  CT found left common ilia, internal iliac and bilateral calf vein DVT. Pt s/p vena cava filter placement on 4/22.  Colonoscopy on 4/23 showing severe colitis with slightly better appearance in rectum and distal sigmoid.  Reviewed weight history in chart. Pt's weight has been trending down over the last year. Pt with 7.4 kg weight loss since 02/19/18. This is a 9.1% weight loss in 8 months which is not significant for timeframe. It appears that pt has been maintaining her weight around 68 kg over the last month.  Noted GI PA ordered Glucerna Shake. Will switch order to Ensure [facility] which provides more protein.  Pt did not answer RD phone calls to room on multiple attempts. Will attempt to obtain diet and weight history in person on next available date.  Meal Completion: 100% x 2 meals  Medications reviewed and include: Glucerna Shake BID, Lialda, Solu-medrol, Protonix IVF: NS @ 50 ml/hr  Labs reviewed: hemoglobin 7.7 (L)  NUTRITION - FOCUSED PHYSICAL EXAM:  Unable to complete at this time. RD working remotely.  Diet Order:   Diet Order            DIET SOFT Room service appropriate? Yes; Fluid consistency: Thin  Diet effective now               EDUCATION NEEDS:   No education needs have been identified at this time  Skin:  Skin Assessment:  Skin Integrity Issues: Incisions: right groin  Last BM:  11/01/18  Height:   Ht Readings from Last 1 Encounters:  10/31/18 5' 3"  (1.6 m)    Weight:   Wt Readings from Last 1 Encounters:  10/31/18 68 kg    Ideal Body Weight:  52.3 kg  BMI:  Body mass index is 26.56 kg/m.  Estimated Nutritional Needs:   Kcal:  8719-5974  Protein:  85-100 grams  Fluid:  1.8-2.0 L    Gaynell Face, MS, RD, LDN Inpatient Clinical Dietitian Pager: [phone] Weekend/After Hours: [phone]

## 2018-11-02 NOTE — Progress Notes (Addendum)
Daily Rounding Note  11/02/2018, 9:59 AM  LOS: 2 days   SUBJECTIVE:   Chief complaint: severe UC    Stools less bloody, a bit more formed but still soft.   Some pain in upper abd after soft AM meal, relieved with hydrocodone.  Had not had any pain meds since colonoscopy.   Overall feeling better.   OBJECTIVE:         Vital signs in last 24 hours:    Temp:  [97.8 F (36.6 C)-98.4 F (36.9 C)] 98 F (36.7 C) (04/24 0425) Pulse Rate:  [69-99] 71 (04/24 0425) Resp:  [17-23] 18 (04/24 0425) BP: (105-118)/(53-77) 105/58 (04/24 0425) SpO2:  [97 %-100 %] 99 % (04/24 0425) Last BM Date: 11/01/18 Filed Weights   10/31/18 0958  Weight: 68 kg   General: less pale.  Looks better   Heart: RRR Chest: clear bil.  No SOB or cough Abdomen: soft, mild to mod tenderness in lower abdomen  Extremities: no CCE Neuro/Psych:  Calm, alert, no gross weakness or deficits.    Intake/Output from previous day: 04/23 0701 - 04/24 0700 In: 1041.7 [P.O.:420; I.V.:621.7] Out: -    Recent Labs    10/31/18 1656 11/01/18 0242 11/02/18 0322  WBC 8.3 5.6 7.7  HGB 8.1* 7.6* 7.7*  HCT 25.9* 24.6* 24.7*  PLT 203 240 281   BMET Recent Labs    10/31/18 1006 11/01/18 0242 11/02/18 0322  NA 132* 137 141  K 3.1* 3.7 3.7  CL 96* 105 109  CO2 24 23 24   GLUCOSE 150* 196* 195*  BUN 6 <5* 6  CREATININE 0.85 0.53 0.65  CALCIUM 8.3* 7.8* 8.3*   LFT Recent Labs    10/31/18 1006 11/01/18 0242 11/02/18 0322  PROT 5.8* 5.0* 4.6*  ALBUMIN 2.4* 1.9* 2.0*  AST 16 10* 9*  ALT 21 16 15   ALKPHOS 62 50 45  BILITOT 0.4 0.2* 0.5    Studies/Results: Vas [place] Lower Extremity Venous (dvt) (only [facility])  Result Date: 10/31/2018 Lower Venous Study     Summary: Right: Findings consistent with acute deep vein thrombosis involving the right peroneal vein. Left: Findings consistent with acute deep vein thrombosis involving the left posterior tibial  vein.  *See table(s) above for measurements and observations. Electronically signed by Curt Jews MD on 10/31/2018 at 4:02:57 PM.    Final    Scheduled Meds: . mesalamine  4.8 g Oral Q breakfast  . methylPREDNISolone (SOLU-MEDROL) injection  40 mg Intravenous Q12H  . pantoprazole (PROTONIX) IV  40 mg Intravenous Q12H   Continuous Infusions: . sodium chloride 50 mL/hr at 11/02/18 0300   PRN Meds:.acetaminophen, dicyclomine, HYDROcodone-acetaminophen, loperamide, morphine injection, ondansetron **OR** ondansetron (ZOFRAN) IV, promethazine, traMADol  ASSESMENT:   *  Severe pan ulcerative colitis.   4/23 colonoscopy/flex sig to splenic flexure: severe colitis with slightly better appearance in rectum and distal sigmoid (? Due to Rowasa enemas?) Not much response to prednisone and Lialda.  Day 3 IV solumedrol.   Will need to start biologic, likely Remicade.  Quantiferon gold neg, Hep B surface Ag negative in 09/2018 CRP 15.9 >> 5.4 over last 24 hours.   C diff negative.  Path pndg from colon   *   New LE DVT New left ileo femoral DVT.  IVC filter placed 4/22.    *   Blood loss anemia, anemia of chronic dz.  MCV normal.  Hgb stable.  Ferritin 68.  Feraheme x 1 given  *  Steroid associated hyperglycemia.    *   Protein malnutrition.   Pre albumin 16.5.     PLAN   *   Added Glucerna BID.    *   Obtain Hgb A1c at sugg of diabetic specialist.    *  Remicade infusion, likely today.  Dr Carlean Purl will make final decision.      Monica Ortega  11/02/2018, 9:59 AM Phone [place] Attending   I have taken an interval history, reviewed the chart and examined the patient. I agree with the Advanced Practitioner's note, impression and recommendations.   She is better w/ IV steroids but second hospitalization in 1 month and failed oral steroids. Pathology severe active UC - not in Epic but I received a fax copy  Best practices now are: Infuse 100m/kg Remicade x  1 Co-administer low dose immunomodulator (6MP) - will check thiopurine methyltransferase levels as well before starting this If does well home in 1-2 days at earliest on 40 mg prednisone also Reduce solumedrol to 40 mg qd CRP in AM, w/ CBC and BMET   CGatha Mayer MD, FPhoenixville HospitalGastroenterology 11/02/2018 11:58 AM Pager [phone]

## 2018-11-03 DIAGNOSIS — Z79899 Other long term (current) drug therapy: Secondary | ICD-10-CM

## 2018-11-03 LAB — CBC
HCT: 26 % — ABNORMAL LOW (ref 36.0–46.0)
Hemoglobin: 8.2 g/dL — ABNORMAL LOW (ref 12.0–15.0)
MCH: 26.5 pg (ref 26.0–34.0)
MCHC: 31.5 g/dL (ref 30.0–36.0)
MCV: 84.1 fL (ref 80.0–100.0)
Platelets: 309 10*3/uL (ref 150–400)
RBC: 3.09 MIL/uL — ABNORMAL LOW (ref 3.87–5.11)
RDW: 13.5 % (ref 11.5–15.5)
WBC: 9.7 10*3/uL (ref 4.0–10.5)
nRBC: 1.3 % — ABNORMAL HIGH (ref 0.0–0.2)

## 2018-11-03 LAB — BASIC METABOLIC PANEL
Anion gap: 7 (ref 5–15)
BUN: <5 mg/dL — ABNORMAL LOW (ref 6–20)
CO2: 26 mmol/L (ref 22–32)
Calcium: 8.4 mg/dL — ABNORMAL LOW (ref 8.9–10.3)
Chloride: 106 mmol/L (ref 98–111)
Creatinine, Ser: 0.66 mg/dL (ref 0.44–1.00)
GFR calc Af Amer: >60 mL/min (ref >60)
GFR calc non Af Amer: >60 mL/min (ref >60)
Glucose, Bld: 106 mg/dL — ABNORMAL HIGH (ref 70–99)
Potassium: 3.2 mmol/L — ABNORMAL LOW (ref 3.5–5.1)
Sodium: 139 mmol/L (ref 135–145)

## 2018-11-03 LAB — C-REACTIVE PROTEIN: CRP: 2.1 mg/dL — ABNORMAL HIGH (ref <1.0)

## 2018-11-03 MED ORDER — DICYCLOMINE HCL 10 MG PO CAPS: 20.0000 mg | ORAL_CAPSULE | Freq: Three times a day (TID) | ORAL | 0 refills | Status: DC

## 2018-11-03 MED ORDER — DICYCLOMINE HCL 10 MG PO CAPS
20.0000 mg | ORAL_CAPSULE | Freq: Three times a day (TID) | ORAL | Status: DC
  Administered 2018-11-03: 20 mg via ORAL
  Filled 2018-11-03: qty 2

## 2018-11-03 MED ORDER — ENSURE ENLIVE PO LIQD: 237.0000 mL | Freq: Two times a day (BID) | ORAL | 12 refills | Status: DC

## 2018-11-03 MED ORDER — ENSURE ENLIVE PO LIQD
237.0000 mL | Freq: Two times a day (BID) | ORAL | Status: DC
  Administered 2018-11-03: 237 mL via ORAL

## 2018-11-03 MED ORDER — POTASSIUM CHLORIDE CRYS ER 20 MEQ PO TBCR
40.0000 meq | EXTENDED_RELEASE_TABLET | Freq: Once | ORAL | Status: AC
  Administered 2018-11-03: 40 meq via ORAL
  Filled 2018-11-03: qty 2

## 2018-11-03 MED ORDER — PREDNISONE 20 MG PO TABS: 40.0000 mg | ORAL_TABLET | Freq: Every day | ORAL | 0 refills | Status: DC

## 2018-11-03 MED ORDER — PREDNISONE 20 MG PO TABS: 40.0000 mg | ORAL_TABLET | Freq: Every day | ORAL | Status: DC

## 2018-11-03 MED ORDER — HYDROCODONE-ACETAMINOPHEN 5-325 MG PO TABS: 1.0000 | ORAL_TABLET | Freq: Four times a day (QID) | ORAL | 0 refills | Status: AC | PRN

## 2018-11-03 NOTE — Progress Notes (Addendum)
   Patient Name: Monica Ortega Date of Encounter: 11/03/2018, 10:47 AM    Subjective  Stools a bit more formed No fevers Had bilateral lower abdominal cramps after breakfast of eggs, juice, potatoes Not using dicyclomine it is prn   Objective  BP 121/81 (BP Location: Right Arm)   Pulse 83   Temp 98.6 F (37 C) (Oral)   Resp 18   Ht 5' 3"  (1.6 m)   Wt 68 kg   LMP 09/26/2018 Comment: pt states no possible pregnancy  SpO2 100%   BMI 26.56 kg/m  Pale NAD abd soft and mildly tender RLQ.LLQ, BS +  CBC Latest Ref Rng & Units 11/03/2018 11/02/2018 11/01/2018  WBC 4.0 - 10.5 K/uL 9.7 7.7 5.6  Hemoglobin 12.0 - 15.0 g/dL 8.2(L) 7.7(L) 7.6(L)  Hematocrit 36.0 - 46.0 % 26.0(L) 24.7(L) 24.6(L)  Platelets 150 - 400 K/uL 309 281 240      Component Value Date/Time   CRP 2.1 (H) 11/03/2018 0252   CRP 5.4 (H) 11/02/2018 0322   CRP 15.9 (H) 11/01/2018 0242   Lab Results  Component Value Date   CREATININE 0.66 11/03/2018   BUN <5 (L) 11/03/2018   NA 139 11/03/2018   K 3.2 (L) 11/03/2018   CL 106 11/03/2018   CO2 26 11/03/2018       Assessment and Plan  *  Severe pan ulcerative colitis.   4/23 colonoscopy/flex sig to splenic flexure: severe colitis with slightly better appearance in rectum and distal sigmoid (? Due to [place] enemas?) Not much response to prednisone and Lialda.  Day r IV solumedrol.   Remicade infused yesterday w/o problems.  Quantiferon gold neg, Hep B surface Ag negative in 09/2018   *   New LE DVT New left ileo femoral DVT.  IVC filter placed 4/22.    *   Blood loss anemia, anemia of chronic dz.  MCV normal.  Hgb stable.  Ferritin 68.   Feraheme x 1 given  *  Steroid associated hyperglycemia.    *   Protein malnutrition.   Pre albumin 16.5.    Overall is really much better than PTA  I think she can go home later today and she desires. As long as tolerates lunch and does not worsen  Here is what I recommend  1) Prednisone 40 mg qd - she has  already and we will advise taper but she will need to be on that for at least another week 2) dicyclomine 20 mg (2x47m) qac - she has already 3) Lialda 2.4 g bid 4) pantoprazole 40 mg qd # 90 no refill 5) 10 hydrocodone 5/500 to be used prn severe pain - she was advised will not be chronic med 6) [facility] office will arrange next Remicade infusion which should be in 2 weeks from yesterday 7) She has 5/21 appt Dr. PHenrene Pastoralready  8) Had  KCL here but would not let mild hypokalemia keep her in house - ? Supplement x 1 week at home 9) TPMT phenotype testing is pending regarding Tx w/ 6MP to be added per Dr. PHenrene Pastor10) Low fiber diet  TMarina Gravel CGatha Mayer MD, [facility] 11/03/2018 10:47 AM Pager [phone] [phone]

## 2018-11-05 ENCOUNTER — Ambulatory Visit (INDEPENDENT_AMBULATORY_CARE_PROVIDER_SITE_OTHER): Payer: BLUE CROSS/BLUE SHIELD | Admitting: Psychology

## 2018-11-05 DIAGNOSIS — F341 Dysthymic disorder: Secondary | ICD-10-CM | POA: Diagnosis not present

## 2018-11-05 DIAGNOSIS — F4010 Social phobia, unspecified: Secondary | ICD-10-CM

## 2018-11-06 NOTE — Discharge Summary (Signed)
[facility] Discharge Summary   Patient: Monica Ortega RXV:[id number]   PCP: [name], DO DOB: 08/08/1990   Date of admission: 10/31/2018   Date of discharge: 11/03/2018     Discharge Diagnoses:   Principal Problem:   DVT (deep venous thrombosis) ([place]) Active Problems:   Rectal bleed   Universal ulcerative colitis with rectal bleeding ([place])   Hypokalemia   Hyponatremia   Admitted From: home Disposition:  home  Recommendations for Outpatient Follow-up:  1. Please follow-up with gastroenterology as well as PCP  Follow-up Information    [name], DO. Schedule an appointment as soon as possible for a visit in 1 week(s).   Specialty:  Family Medicine Contact information: [place] [address] [phone]          Diet recommendation: Regular diet  Activity: The patient is advised to gradually reintroduce usual activities.  Discharge Condition: good  Code Status: Full code  History of present illness: As per the H and P dictated on admission, "Monica Ortega is a 28 y.o. female with medical history significant of Ulcerative colitis, depression, hyperglycemia, who presented to the ER with abdominal pain and fever.  Also rectal bleed.  Patient was seen through telehealth by her gastroenterologist who referred her to the ER due to worsening symptoms.  She has been on prednisone as well as Mesalamine.  She was confirmed back in March of this year to have her ulcerative colitis.  She is still on prednisone dose taper currently 40 mg a day.  On arrival in the ER patient was worked up and found to still have diffuse abdominal tenderness evidence of rectal bleed but new finding of acute DVT in the left common and internal iliac veins.  Patient is therefore being admitted for this complex management.  Gastroenterology as well as vascular surgery have been consulted for decision making.  She has been admitted to the medical service.  She currently has  pain at 5 out of 10 around the periumbilical region.  She denied any fever or chills.  Denied any nausea or vomiting..  ED Course: White count is 8.9.  She is afebrile.  Blood pressure stable.  Her heart rate is 123, hemoglobin 9.3 and platelets 241.  Sodium is 132 potassium 3.1 chloride 96 CO2 24 with a calcium 8.3.  Glucose is 150.  CT abdomen pelvis showed mild improvement in diffuse colitis since March.  There is acute DVT in left common and internal iliac veins which is new.  Venous Doppler ultrasound the lower extremity showed acute DVT involving the right peroneal vein as well as the left posterior tibial vein.  Patient is being admitted.  Due to active GI bleed vascular surgery consulted for inputs."  Hospital Course:  Summary of her active problems in the hospital is as following. 1. Acute extensive bilateral DVTs involving left common iliac vein Rectal bleeding. S/P IVC filter placement on 10/31/2018 by vascular surgery.  Presents with fever and ongoing rectal bleeding. Due to fatigue underwent CT abdomen pelvis which was positive for occlusive thrombus. Lower extremity venous Doppler was also positive for DVT. Vascular surgery consulted S/P inferior vena cava filter placement Ideally vascular surgery would recommend anticoagulation whenever GI feels it is appropriate.  2.  Acute on chronic ulcerative colitis Not responding to high-dose steroid therapy with still mild. S/P colonoscopy 11/01/2018. CRP elevated. GI pathogen panel as well as C. difficile toxin negative. Biopsy performed. GI started the patient on IV Remicade.  on prednisone and bentyl  3.  Acute blood loss anemia. Rectal bleeding. H&H dropped from admission likely secondary to dilution. No further bleeding here reported. We will continue to monitor H&H for now. Transfuse for hemoglobin less than 7.  4. Hypokalemia. Replaced. Monitor.  5. Bandemia.,  Fever Likely secondary to steroid use and acute  flareup of gastroparesis. Monitor for active infection. Patient did have some fever at home but no further fever here in the hospital. Infectious work-up reportedly outpatient was also negative.  6.  Hyperglycemia. Reportedly patient does have history of hyperglycemia but A1c has always remained consistently under control. Do not think the patient actually has diabetes. Currently on steroids  7. Pain control  - [employer] Controlled Substance Reporting System database was reviewed. - 5 day supply was provided. - Patient was instructed, not to drive, operate heavy machinery, perform activities at heights, swimming or participation in water activities or provide baby sitting services while on Pain, Sleep and Anxiety Medications; until her outpatient Physician has advised to do so again.  - Also recommended to not to take more than prescribed Pain, Sleep and Anxiety Medications.  Patient was ambulatory without any assistance. On the day of the discharge the patient's vitals were stable , and no other acute medical condition were reported by patient. the patient was felt safe to be discharge at home with family.  Consultants: gastroenterology  Procedures: colonoscopy  DISCHARGE MEDICATION: Allergies as of 11/03/2018   No Known Allergies     Medication List    STOP taking these medications   aspirin-acetaminophen-caffeine 250-250-65 MG tablet Commonly known as:  EXCEDRIN MIGRAINE   traMADol 50 MG tablet Commonly known as:  Ultram     TAKE these medications   Acetaminophen Extra Strength 500 MG tablet Generic drug:  acetaminophen Take 1,000 mg by mouth every 6 (six) hours as needed.   dicyclomine 10 MG capsule Commonly known as:  BENTYL Take 1-2 by mouth every 4-6 hours as needed for abdominal pain What changed:    how much to take  how to take this  when to take this  reasons to take this  additional instructions   dicyclomine 10 MG capsule Commonly known as:   BENTYL Take 2 capsules (20 mg total) by mouth 3 (three) times daily before meals. What changed:  You were already taking a medication with the same name, and this prescription was added. Make sure you understand how and when to take each.   feeding supplement (ENSURE ENLIVE) Liqd Take 237 mLs by mouth 2 (two) times daily between meals.   HYDROcodone-acetaminophen 5-325 MG tablet Commonly known as:  NORCO/VICODIN Take 1 tablet by mouth every 6 (six) hours as needed for up to 5 days for moderate pain.   loperamide 2 MG capsule Commonly known as:  IMODIUM Take 2 mg by mouth daily as needed for diarrhea or loose stools.   mesalamine 1.2 g EC tablet Commonly known as:  LIALDA Take 4 tablets (4.8 g total) by mouth daily with breakfast. What changed:  Another medication with the same name was removed. Continue taking this medication, and follow the directions you see here.   omeprazole 20 MG capsule Commonly known as:  PRILOSEC Take 1 capsule (20 mg total) by mouth 2 (two) times daily before a meal.   ondansetron 4 MG tablet Commonly known as:  ZOFRAN Take 1 tablet (4 mg total) by mouth every 6 (six) hours. What changed:    when to take this  reasons to take this   predniSONE 20 MG tablet Commonly known as:  DELTASONE Take 2 tablets (40 mg total) by mouth daily. Use as directed   promethazine 25 MG tablet Commonly known as:  PHENERGAN Take 1 tablet (25 mg total) by mouth every 6 (six) hours as needed for nausea or vomiting.      No Known Allergies Discharge Instructions    Diet general   Complete by:  As directed    Discharge instructions   Complete by:  As directed    It is important that you read the given instructions as well as go over your medication list with RN to help you understand your care after this hospitalization.  Discharge Instructions: Please follow-up with PCP in 1-2 weeks  Please request your primary care physician to go over all Hospital Tests and  Procedure/Radiological results at the follow up. Please get all Hospital records sent to your PCP by signing hospital release before you go home.   Do not drive, operating heavy machinery, perform activities at heights, swimming or participation in water activities or provide baby sitting services while you are on Pain, Sleep and Anxiety Medications; until you have been seen by Primary Care Physician or a Neurologist and advised to do so again. Do not take more than prescribed Pain, Sleep and Anxiety Medications. You were cared for by a hospitalist during your hospital stay. If you have any questions about your discharge medications or the care you received while you were in the hospital after you are discharged, you can call the unit @UNIT @ you were admitted to and ask to speak with the hospitalist on call if the hospitalist that took care of you is not available.  Once you are discharged, your primary care physician will handle any further medical issues. Please note that NO REFILLS for any discharge medications will be authorized once you are discharged, as it is imperative that you return to your primary care physician (or establish a relationship with a primary care physician if you do not have one) for your aftercare needs so that they can reassess your need for medications and monitor your lab values. You Must read complete instructions/literature along with all the possible adverse reactions/side effects for all the Medicines you take and that have been prescribed to you. Take any new Medicines after you have completely understood and accept all the possible adverse reactions/side effects. Wear Seat belts while driving. If you have smoked or chewed Tobacco in the last 2 yrs please stop smoking and/or stop any Recreational drug use.  If you drink alcohol, please stop the use and do not drive, operating heavy machinery, perform activities at heights, swimming or participation in water activities or  provide baby sitting services under influence.   Increase activity slowly   Complete by:  As directed      Discharge Exam: Filed Weights   10/31/18 0958  Weight: 68 kg   Vitals:   11/03/18 0732 11/03/18 1142  BP: 121/81 111/69  Pulse: 83   Resp: 18 19  Temp: 98.6 F (37 C) 99 F (37.2 C)  SpO2: 100% 97%   General: Appear in mild distress, no Rash; Oral Mucosa moist. Cardiovascular: S1 and S2 Present, no Murmur, no JVD Respiratory: Bilateral Air entry present and Clear to Auscultation, no Crackles, no wheezes Abdomen: Bowel Sound present, Soft and no tenderness Extremities: no Pedal edema, no calf tenderness Neurology: Grossly no focal neuro deficit.  The results of significant diagnostics from  this hospitalization (including imaging, microbiology, ancillary and laboratory) are listed below for reference.    Significant Diagnostic Studies: Ct Abdomen Pelvis W Contrast  Result Date: 10/31/2018 CLINICAL DATA:  Abdominal pain for over 1 month. Rectal bleeding. Ulcerative colitis. EXAM: CT ABDOMEN AND PELVIS WITH CONTRAST TECHNIQUE: Multidetector CT imaging of the abdomen and pelvis was performed using the standard protocol following bolus administration of intravenous contrast. CONTRAST:  157m OMNIPAQUE IOHEXOL 300 MG/ML  SOLN COMPARISON:  09/29/2018 FINDINGS: Lower Chest: No acute findings. Hepatobiliary: No hepatic masses identified. Focal fatty infiltration is seen in the central liver. Gallbladder is unremarkable. Pancreas:  No mass or inflammatory changes. Spleen: Within normal limits in size and appearance. Adrenals/Urinary Tract: No masses identified. No evidence of hydronephrosis. Stomach/Bowel: Diffuse colitis shows mild improvement since previous study. No evidence of small bowel involvement. No evidence of abscess or fistula. Vascular/Lymphatic: No pathologically enlarged lymph nodes. No abdominal aortic aneurysm. Acute DVT is seen in the left common and internal iliac vein,  new since prior exam. Reproductive:  No mass or other significant abnormality. Other:  None. Musculoskeletal:  No suspicious bone lesions identified. IMPRESSION: 1. Mild improvement in diffuse colitis since previous study. No evidence of abscess or fistula. 2. Acute DVT in left common and internal iliac veins, new since prior exam. These results will be called to the ordering clinician or representative by the Radiologist Assistant, and communication documented in the PACS or zVision Dashboard. Electronically Signed   By: JEarle GellM.D.   On: 10/31/2018 10:21   Vas UKoreaLower Extremity Venous (dvt) (only [facility])  Result Date: 10/31/2018  Lower Venous Study Other Indications: Abdomen pain, DVT noted in left CFV and iliac on CT scan.  Examination Guidelines: A complete evaluation includes B-mode imaging, spectral Doppler, color Doppler, and power Doppler as needed of all accessible portions of each vessel. Bilateral testing is considered an integral part of a complete examination. Limited examinations for reoccurring indications may be performed as noted.  +---------+---------------+---------+-----------+----------+-------+ RIGHT    CompressibilityPhasicitySpontaneityPropertiesSummary +---------+---------------+---------+-----------+----------+-------+ CFV      Full           Yes      Yes                          +---------+---------------+---------+-----------+----------+-------+ SFJ      Full                                                 +---------+---------------+---------+-----------+----------+-------+ FV Prox  Full                                                 +---------+---------------+---------+-----------+----------+-------+ FV Mid   Full                                                 +---------+---------------+---------+-----------+----------+-------+ FV DistalFull                                                  +---------+---------------+---------+-----------+----------+-------+  PFV      Full                                                 +---------+---------------+---------+-----------+----------+-------+ POP      Full           Yes      Yes                          +---------+---------------+---------+-----------+----------+-------+ PTV      Full                                                 +---------+---------------+---------+-----------+----------+-------+ PERO     None                                         Acute   +---------+---------------+---------+-----------+----------+-------+   +---------+---------------+---------+-----------+----------+-------+ LEFT     CompressibilityPhasicitySpontaneityPropertiesSummary +---------+---------------+---------+-----------+----------+-------+ CFV      Full           Yes      Yes                          +---------+---------------+---------+-----------+----------+-------+ SFJ      Full                                                 +---------+---------------+---------+-----------+----------+-------+ FV Prox  Full                                                 +---------+---------------+---------+-----------+----------+-------+ FV Mid   Full                                                 +---------+---------------+---------+-----------+----------+-------+ FV DistalFull                                                 +---------+---------------+---------+-----------+----------+-------+ PFV      Full                                                 +---------+---------------+---------+-----------+----------+-------+ POP      Full           Yes      Yes                          +---------+---------------+---------+-----------+----------+-------+ PTV      None  Acute   +---------+---------------+---------+-----------+----------+-------+ PERO     Full                                                  +---------+---------------+---------+-----------+----------+-------+ Ex iliac Full           Yes      Yes                          +---------+---------------+---------+-----------+----------+-------+ Left external iliac vein was imaged and no evidence of DVT.    Summary: Right: Findings consistent with acute deep vein thrombosis involving the right peroneal vein. Left: Findings consistent with acute deep vein thrombosis involving the left posterior tibial vein.  *See table(s) above for measurements and observations. Electronically signed by Curt Jews MD on 10/31/2018 at 4:02:57 PM.    Final    Microbiology: Recent Results (from the past 240 hour(s))  Surgical pcr screen     Status: None   Collection Time: 10/31/18  1:43 PM  Result Value Ref Range Status   MRSA, PCR NEGATIVE NEGATIVE Final   Staphylococcus aureus NEGATIVE NEGATIVE Final    Comment: (NOTE) The Xpert SA Assay (FDA approved for NASAL specimens in patients 57 years of age and older), is one component of a comprehensive surveillance program. It is not intended to diagnose infection nor to guide or monitor treatment. Performed at [facility], [place] [address] [place], [place] [address]    Labs: CBC: Recent Labs  Lab 10/31/18 1006 10/31/18 1656 11/01/18 0242 11/02/18 0322 11/03/18 0252  WBC 8.9 8.3 5.6 7.7 9.7  NEUTROABS 6.5  --   --   --   --   HGB 9.3* 8.1* 7.6* 7.7* 8.2*  HCT 30.6* 25.9* 24.6* 24.7* 26.0*  MCV 85.0 83.8 82.6 83.4 84.1  PLT 241 203 240 281 371   Basic Metabolic Panel: Recent Labs  Lab 10/31/18 1006 11/01/18 0242 11/02/18 0322 11/03/18 0252  NA 132* 137 141 139  K 3.1* 3.7 3.7 3.2*  CL 96* 105 109 106  CO2 24 23 24 26   GLUCOSE 150* 196* 195* 106*  BUN 6 <5* 6 <5*  CREATININE 0.85 0.53 0.65 0.66  CALCIUM 8.3* 7.8* 8.3* 8.4*   Liver Function Tests: Recent Labs  Lab 10/31/18 1006 11/01/18 0242 11/02/18 0322  AST 16 10* 9*   ALT 21 16 15   ALKPHOS 62 50 45  BILITOT 0.4 0.2* 0.5  PROT 5.8* 5.0* 4.6*  ALBUMIN 2.4* 1.9* 2.0*   Recent Labs  Lab 10/31/18 1006  LIPASE 63*   No results for input(s): AMMONIA in the last 168 hours. Cardiac Enzymes: No results for input(s): CKTOTAL, CKMB, CKMBINDEX, TROPONINI in the last 168 hours. BNP (last 3 results) No results for input(s): BNP in the last 8760 hours. CBG: No results for input(s): GLUCAP in the last 168 hours. Time spent: 35 minutes  Signed:  Berle Mull  [facility] 11/03/2018

## 2018-11-07 LAB — THIOPURINE METHYLTRANSFERASE (TPMT), RBC: TPMT Activity:: 26.4 Units/mL RBC

## 2018-11-09 ENCOUNTER — Other Ambulatory Visit: Payer: Self-pay

## 2018-11-09 ENCOUNTER — Telehealth: Payer: Self-pay | Admitting: Internal Medicine

## 2018-11-09 ENCOUNTER — Ambulatory Visit (INDEPENDENT_AMBULATORY_CARE_PROVIDER_SITE_OTHER): Payer: BLUE CROSS/BLUE SHIELD | Admitting: Family Medicine

## 2018-11-09 ENCOUNTER — Encounter: Payer: Self-pay | Admitting: Family Medicine

## 2018-11-09 DIAGNOSIS — K51011 Ulcerative (chronic) pancolitis with rectal bleeding: Secondary | ICD-10-CM

## 2018-11-09 DIAGNOSIS — I82403 Acute embolism and thrombosis of unspecified deep veins of lower extremity, bilateral: Secondary | ICD-10-CM

## 2018-11-09 NOTE — Telephone Encounter (Signed)
Pt would like to schedule a second infusion for next Saturday.  She reported that she woke up with leg pain today.  Pt was seen in the hospital last week and blood clot shown on CT.

## 2018-11-09 NOTE — Telephone Encounter (Signed)
Patient notified of infusions set up at [facility] short stay for 11/16/18, 12/14/18, then q 8 weeks.

## 2018-11-09 NOTE — Progress Notes (Signed)
Virtual Visit via Video Note  I connected with Monica Ortega  on 11/09/18 at 11:00 AM EDT by a video enabled telemedicine application and verified that I am speaking with the correct person using two identifiers.  Location patient: home Location provider:work or home office Persons participating in the virtual visit: patient, provider  I discussed the limitations of evaluation and management by telemedicine and the availability of in person appointments. The patient expressed understanding and agreed to proceed.   Monica Ortega DOB: 07-Jul-1991 Encounter date: 11/09/2018  This is a 28 y.o. female who presents with Chief Complaint  Patient presents with  . Leg Pain    History of present illness:  HPI  Developed bilat LE DVTs and was hospitalized. Had been dealing with UC flare and was more bedridden. She wasn't having any discomfort prior to hospitalization.  Right knee esp became painful today.   No swelling that she notes. No temperature change or skin changes. Right knee feels worse, but both knees painful. Left knee feels better than it did this morning. No significant discomfort below the right knee. No tenderness with palpation behind knee. With walking she does have pain. It is still painful, but feels better now. Not bothering her too much now. This morning pain was 9-10/10 and right now feels like 2-3/10. She did take tylenol this morning. Took lialda and prednisone as well.   Has been doing a small amount of walking since discharge from the hospital and trying to avoid prolonged inactivity.  She did not Walk an increased amount on the night prior to her leg discomfort.  No Known Allergies Current Meds  Medication Sig  . acetaminophen (ACETAMINOPHEN EXTRA STRENGTH) 500 MG tablet Take 1,000 mg by mouth every 6 (six) hours as needed.  . dicyclomine (BENTYL) 10 MG capsule Take 2 capsules (20 mg total) by mouth 3 (three) times daily before meals.  . feeding supplement, ENSURE  ENLIVE, (ENSURE ENLIVE) LIQD Take 237 mLs by mouth 2 (two) times daily between meals.  Marland Kitchen loperamide (IMODIUM) 2 MG capsule Take 2 mg by mouth daily as needed for diarrhea or loose stools.  . mesalamine (LIALDA) 1.2 g EC tablet Take 4 tablets (4.8 g total) by mouth daily with breakfast.  . omeprazole (PRILOSEC) 20 MG capsule Take 1 capsule (20 mg total) by mouth 2 (two) times daily before a meal.  . ondansetron (ZOFRAN) 4 MG tablet Take 1 tablet (4 mg total) by mouth every 6 (six) hours. (Patient taking differently: Take 4 mg by mouth every 6 (six) hours as needed for nausea or vomiting. )  . predniSONE (DELTASONE) 20 MG tablet Take 2 tablets (40 mg total) by mouth daily. Use as directed  . promethazine (PHENERGAN) 25 MG tablet Take 1 tablet (25 mg total) by mouth every 6 (six) hours as needed for nausea or vomiting.    Review of Systems  Constitutional: Negative for chills and fever.  Respiratory: Negative for cough and shortness of breath.   Cardiovascular: Negative for chest pain, palpitations and leg swelling.  Gastrointestinal: Negative for blood in stool. Abdominal pain: improved.  Skin: Negative for color change, pallor, rash and wound.    Objective:  LMP  (LMP Unknown) Comment: pt states no possible pregnancy      BP Readings from Last 3 Encounters:  11/03/18 111/69  10/03/18 107/73  09/25/18 118/78   Wt Readings from Last 3 Encounters:  10/31/18 149 lb 14.6 oz (68 kg)  10/30/18 152 lb (68.9 kg)  10/23/18  150 lb (68 kg)    EXAM:  GENERAL: alert, oriented, appears well and in no acute distress  HEENT: atraumatic, conjunctiva clear, no obvious abnormalities on inspection of external nose and ears  NECK: normal movements of the head and neck  LUNGS: on inspection no signs of respiratory distress, breathing rate appears normal, no obvious gross SOB, gasping or wheezing  CV: no obvious cyanosis  MS: moves all visible extremities without noticeable  abnormality  PSYCH/NEURO: pleasant and cooperative, no obvious depression or anxiety, speech and thought processing grossly intact  Legs: no obvious color change. Legs are not tender to palpation. There is no noted swelling or enlargement appreciated.   Assessment/Plan  1. Acute deep vein thrombosis (DVT) of both lower extremities, unspecified vein (HCC) She does have IVC filter placed.  She has not been started on anticoagulation due to bleeding from ulcerative colitis flare.  Since she had such dramatic improvement in pain from this morning until time of appointment, I do not feel that further work-up is warranted at this time.  Of note, on review of previous vascular imaging she did have extensive bilateral DVT and if pain worsens I would certainly consider repeat to assess any change.  I have put in referral to see vascular surgery or at least have them on board as outpatient since she will likely need to follow with them in the future.  Additionally, I have copied gastroenterology on this chart to make them aware of this visit today and so that anticoagulation can be discussed with the patient once it is appropriate and she is stable from a GI standpoint.  We reviewed reasons to seek further evaluation, including edema, color change, increased pain, or temperature change of lower extremities or difficulty with breathing. Her sister is with her now and there to help if any medical needs arise. - Ambulatory referral to Vascular Surgery  2. Universal ulcerative colitis with rectal bleeding (HCC) Symptomatically improving; see above. She does have follow up with GI in 3 weeks time.     I discussed the assessment and treatment plan with the patient. The patient was provided an opportunity to ask questions and all were answered. The patient agreed with the plan and demonstrated an understanding of the instructions.   The patient was advised to call back or seek an in-person evaluation if the  symptoms worsen or if the condition fails to improve as anticipated.  I provided 25 minutes of non-face-to-face time during this encounter.   Micheline Rough, MD

## 2018-11-09 NOTE — Telephone Encounter (Signed)
Patient was started on Remicade as an inpatient. She reports that she is due for the next infusion on 11/16/18.  Discussed with Dr. Carlean Purl patient need Remicade 5 mg/kg IV on week 2, 6 then q 8 weeks .  Patient is advised to contact her PCP about leg pain.  She is advised that I will look into her insurance and call her back about setting up infusions.

## 2018-11-13 ENCOUNTER — Encounter: Payer: Self-pay | Admitting: Family Medicine

## 2018-11-16 ENCOUNTER — Other Ambulatory Visit: Payer: Self-pay

## 2018-11-16 ENCOUNTER — Ambulatory Visit (HOSPITAL_COMMUNITY)
Admission: RE | Admit: 2018-11-16 | Discharge: 2018-11-16 | Disposition: A | Discharge status: Home / Self Care | Payer: BLUE CROSS/BLUE SHIELD | Source: Ambulatory Visit | Attending: Internal Medicine | Admitting: Internal Medicine

## 2018-11-16 DIAGNOSIS — K51011 Ulcerative (chronic) pancolitis with rectal bleeding: Secondary | ICD-10-CM | POA: Diagnosis not present

## 2018-11-16 MED ORDER — DIPHENHYDRAMINE HCL 25 MG PO CAPS
ORAL_CAPSULE | ORAL | Status: AC
  Filled 2018-11-16: qty 2

## 2018-11-16 MED ORDER — ACETAMINOPHEN 325 MG PO TABS: 650.0000 mg | ORAL_TABLET | Freq: Every day | ORAL | Status: DC

## 2018-11-16 MED ORDER — SODIUM CHLORIDE 0.9 % IV SOLN
5.0000 mg/kg | Freq: Once | INTRAVENOUS | Status: AC
  Administered 2018-11-16: 300 mg via INTRAVENOUS
  Filled 2018-11-16: qty 30

## 2018-11-16 MED ORDER — SODIUM CHLORIDE 0.9 % IV SOLN: INTRAVENOUS | Status: DC

## 2018-11-16 MED ORDER — DIPHENHYDRAMINE HCL 25 MG PO TABS
50.0000 mg | ORAL_TABLET | Freq: Every day | ORAL | Status: DC
  Administered 2018-11-16: 08:00:00 50 mg via ORAL
  Filled 2018-11-16: qty 2

## 2018-11-16 MED ORDER — SODIUM CHLORIDE 0.9 % IV SOLN: 5.0000 mg/kg | Freq: Once | INTRAVENOUS | Status: DC

## 2018-11-16 MED ORDER — SODIUM CHLORIDE 0.9 % IV SOLN: 5.0000 mg/kg | INTRAVENOUS | Status: DC

## 2018-11-19 MED ORDER — APIXABAN 5 MG PO TABS: ORAL_TABLET | ORAL | 5 refills | Status: DC

## 2018-11-19 NOTE — Telephone Encounter (Signed)
-----   Message from Caren Macadam, MD sent at 11/19/2018  3:51 PM EDT ----- Thanks so very much! [employer]! If this isn't already in chart; will you copy to telephone template so it's saved? ----- Message ----- From: Alverda Skeans, RN Sent: 11/19/2018   3:11 PM EDT To: Caren Macadam, MD  Spoke with Jacqlyn Larsen at [facility] she will see if she can get patient an earlier appt before June. Currently the doctors are only in the office on Tuesdays. I spoke with an Eliquis rep by the name of Denice Bors  who will send me a link to give patient so she can only pay $10/month. Clinic RN also communicated this information to patient.  ----- Message ----- From: Caren Macadam, MD Sent: 11/19/2018   2:39 PM EDT To: Alverda Skeans, RN  Please see our mychart messages going back and forth:  1. I asked patient to follow up on vascular referral. She was seen in hospital, has extensive bilat DVT and had IVC filter placed by vascular. She was told when I asked her to call for follow up that it would be a month unless referral was put in as urgent. I can certainly re-enter this referral if needed, but my goal was to get her started on anticoagulation and I thought since they will be caring for her and eventually be removing filter that they may want to be the ones starting this.   Additionally I have cost concerns/coverage if we go with med like Eliquis.   Patient is having some increased stiffness in legs so I do believe that her clot is increasing. She has been cleared from GI to start anticoagulation.   I figured they may have samples, savings card access that we do not have.    2. I have sent rx of eliquis to patient pharmacy. Since we do not have savings card, patient will have to call 1-855-ELIQUIS to see about savings that are offered for her.   My goal would be to get her anticoagulated as soon as possible. So if vascular can see her soon, then great. If not, please see if we can help get her on med  that is affordable. Please update patient with status of vascular appointment/cost of med.   Thanks!

## 2018-11-20 ENCOUNTER — Other Ambulatory Visit: Payer: Self-pay

## 2018-11-20 DIAGNOSIS — I82423 Acute embolism and thrombosis of iliac vein, bilateral: Secondary | ICD-10-CM

## 2018-11-20 NOTE — Telephone Encounter (Signed)
Was this just copied to me as FYI?

## 2018-11-23 ENCOUNTER — Encounter: Payer: Self-pay | Admitting: Family Medicine

## 2018-11-23 NOTE — Telephone Encounter (Signed)
I called the pt and she stated she has noticed a small amount of bleeding, approximately an amount of a teaspoon.  Stated she has had a BM and noticed a small amount of less than a teaspoon at that time. Patient complains of abdominal pain since last night, states she used Hydrocodone which provided some relief this AM.

## 2018-11-23 NOTE — Telephone Encounter (Signed)
Please call and see how much bleeding. Has there been BM since that time? Any pain? (abdominal/rectal)

## 2018-11-26 ENCOUNTER — Telehealth (HOSPITAL_COMMUNITY): Payer: Self-pay

## 2018-11-26 NOTE — Telephone Encounter (Signed)
The above patient or their representative was contacted and gave the following answers to these questions:         Do you have any of the following symptoms? No  Fever                    Cough                   Shortness of breath  Do  you have any of the following other symptoms?    muscle pain         vomiting,        diarrhea        rash         weakness        red eye        abdominal pain         bruising          bruising or bleeding              joint pain           severe headache    Have you been in contact with someone who was or has been sick in the past 2 weeks? No  Yes                 Unsure                         Unable to assess   Does the person that you were in contact with have any of the following symptoms?   Cough         shortness of breath           muscle pain         vomiting,            diarrhea            rash            weakness           fever            red eye           abdominal pain           bruising  or  bleeding                joint pain                severe headache               Have you  or someone you have been in contact with traveled internationally in th last month?  No        If yes, which countries?   Have you  or someone you have been in contact with traveled outside [place] in th last month? No         If yes, which state and city?   COMMENTS OR ACTION PLAN FOR THIS PATIENT:

## 2018-11-27 ENCOUNTER — Other Ambulatory Visit: Payer: Self-pay

## 2018-11-27 ENCOUNTER — Ambulatory Visit (INDEPENDENT_AMBULATORY_CARE_PROVIDER_SITE_OTHER): Payer: BLUE CROSS/BLUE SHIELD | Admitting: Psychology

## 2018-11-27 ENCOUNTER — Ambulatory Visit: Payer: BLUE CROSS/BLUE SHIELD | Admitting: Vascular Surgery

## 2018-11-27 ENCOUNTER — Ambulatory Visit (HOSPITAL_COMMUNITY)
Admission: RE | Admit: 2018-11-27 | Discharge: 2018-11-27 | Disposition: A | Discharge status: Home / Self Care | Payer: BLUE CROSS/BLUE SHIELD | Source: Ambulatory Visit | Attending: Family | Admitting: Family

## 2018-11-27 ENCOUNTER — Encounter: Payer: Self-pay | Admitting: Vascular Surgery

## 2018-11-27 VITALS — BP 113/79 | HR 100 | Temp 98.7°F | Resp 20 | Ht 63.0 in | Wt 140.7 lb

## 2018-11-27 DIAGNOSIS — F4010 Social phobia, unspecified: Secondary | ICD-10-CM

## 2018-11-27 DIAGNOSIS — F341 Dysthymic disorder: Secondary | ICD-10-CM | POA: Diagnosis not present

## 2018-11-27 DIAGNOSIS — I82423 Acute embolism and thrombosis of iliac vein, bilateral: Secondary | ICD-10-CM | POA: Insufficient documentation

## 2018-11-27 NOTE — Progress Notes (Signed)
[facility] [place]  Patient name: Monica Ortega MRN: [id number] DOB: 1991/01/24 Sex: female  REASON FOR VISIT: Follow-up DVT and vena cava filter placement  HPI: Monica Ortega is a 28 y.o. female today for follow-up.  She presented to [facility] with left iliac DVT by CT scan on 10/31/2018.  She has history of ulcerative colitis and is having a exacerbation with rectal bleeding.  CT scan for evaluation of this showed incidental finding of left common and internal iliac DVT.  She was having no lower extremity symptoms but had lower extremity duplex which showed calf vein DVT bilaterally.  Due to her contraindication for anticoagulation, a vena cava filter was placed on 10/31/2018.  She eventually was discharged home.  Her ulcerative colitis bleeding improved and she was started on Eliquis approximately 11/13/2018.  She is here today for discussion of ongoing treatment and potential removal of her vena cava filter since she is now anticoagulated.  She does report she has noted some tenderness and aching sensation in her right calf and thigh.  No significant swelling  Past Medical History:  Diagnosis Date  . Ankle sprain   . Anxiety   . Depression    denies hospitalization or medication  . DVT (deep venous thrombosis) ([place]) 10/31/2018  . Hyponatremia   . Universal ulcerative colitis with rectal bleeding ([place]) 10/31/2018    Family History  Problem Relation Age of Onset  . Diabetes Mother   . Hypertension Father   . Heart disease Maternal Aunt        enlarged heart  . Colon cancer Neg Hx   . Esophageal cancer Neg Hx   . Stomach cancer Neg Hx   . Pancreatic cancer Neg Hx   . Liver disease Neg Hx   . Inflammatory bowel disease Neg Hx     SOCIAL HISTORY: Social History   Tobacco Use  . Smoking status: Never Smoker  . Smokeless tobacco: Never Used  Substance Use Topics  . Alcohol use: No    Comment: denied alcohol     No Known Allergies  Current Outpatient Medications  Medication Sig Dispense Refill  . acetaminophen (ACETAMINOPHEN EXTRA STRENGTH) 500 MG tablet Take 1,000 mg by mouth every 6 (six) hours as needed.    Marland Kitchen apixaban (ELIQUIS) 5 MG TABS tablet 2 tabs (63m) PO BID x 7 days, then 1 tab PO BID 74 tablet 5  . feeding supplement, ENSURE ENLIVE, (ENSURE ENLIVE) LIQD Take 237 mLs by mouth 2 (two) times daily between meals. 237 mL 12  . loperamide (IMODIUM) 2 MG capsule Take 2 mg by mouth daily as needed for diarrhea or loose stools.    . mesalamine (LIALDA) 1.2 g EC tablet Take 4 tablets (4.8 g total) by mouth daily with breakfast. 120 tablet 6  . omeprazole (PRILOSEC) 20 MG capsule Take 1 capsule (20 mg total) by mouth 2 (two) times daily before a meal. 180 capsule 1  . predniSONE (DELTASONE) 20 MG tablet Take 2 tablets (40 mg total) by mouth daily. Use as directed 30 tablet 0  . dicyclomine (BENTYL) 10 MG capsule Take 2 capsules (20 mg total) by mouth 3 (three) times daily before meals. (Patient not taking: Reported on 11/27/2018) 30 capsule 0  . ondansetron (ZOFRAN) 4 MG tablet Take 1 tablet (4 mg total) by mouth every 6 (six) hours. (Patient not taking: Reported on 11/27/2018) 30 tablet 3  . promethazine (PHENERGAN) 25 MG tablet Take 1 tablet (25 mg  total) by mouth every 6 (six) hours as needed for nausea or vomiting. (Patient not taking: Reported on 11/27/2018) 30 tablet 1   No current facility-administered medications for this visit.     REVIEW OF SYSTEMS:  [X]  denotes positive finding, [ ]  denotes negative finding Cardiac  Comments:  Chest pain or chest pressure:    Shortness of breath upon exertion:    Short of breath when lying flat:    Irregular heart rhythm:        Vascular    Pain in calf, thigh, or hip brought on by ambulation:    Pain in feet at night that wakes you up from your sleep:     Blood clot in your veins:    Leg swelling:           PHYSICAL EXAM: There were no vitals  filed for this visit.  GENERAL: The patient is a well-nourished female, in no acute distress. The vital signs are documented above. CARDIOVASCULAR: Plus dorsalis pedis pulses bilaterally.  No significant lower extremity swelling.  Mild tenderness in her right popliteal space PULMONARY: There is good air exchange  MUSCULOSKELETAL: There are no major deformities or cyanosis. NEUROLOGIC: No focal weakness or paresthesias are detected. SKIN: There are no ulcers or rashes noted. PSYCHIATRIC: The patient has a normal affect.  DATA:  Duplex today shows marked progression of DVT in her right leg with thrombus in her right femoral vein and popliteal vein which was not present on the 10/31/2018 study  MEDICAL ISSUES: Had long discussion with the patient.  I have advised against removal of her vena cava filter since she has had propagation of her thrombus.  In all likelihood this happened prior to initiation of anticoagulation.  She feels that her right leg symptoms predated initiation of the Eliquis.  I would recommend another 2 months for stabilization of thrombus prior to consideration of removal of her vena cava filter.  I did describe removal of filter as an outpatient in the [facility] lab.  We will see her in 2 months with imaging of her IVC and iliac veins and also repeat lower extremity venous study.    Rosetta Posner, MD FACS [facility] Tel [phone] Pager [phone]

## 2018-11-28 ENCOUNTER — Telehealth: Payer: Self-pay

## 2018-11-28 NOTE — Telephone Encounter (Signed)
Phone screening completed for webex appointment tomorrow

## 2018-11-28 NOTE — Progress Notes (Signed)
Patient screened over phone for webex visit tomorrow

## 2018-11-29 ENCOUNTER — Ambulatory Visit (INDEPENDENT_AMBULATORY_CARE_PROVIDER_SITE_OTHER): Payer: BLUE CROSS/BLUE SHIELD | Admitting: Internal Medicine

## 2018-11-29 ENCOUNTER — Other Ambulatory Visit: Payer: Self-pay

## 2018-11-29 ENCOUNTER — Telehealth: Payer: Self-pay

## 2018-11-29 ENCOUNTER — Encounter: Payer: Self-pay | Admitting: Internal Medicine

## 2018-11-29 DIAGNOSIS — K51011 Ulcerative (chronic) pancolitis with rectal bleeding: Secondary | ICD-10-CM | POA: Diagnosis not present

## 2018-11-29 NOTE — Patient Instructions (Addendum)
1.  Continue Lialda 4.8 g daily 2.  Resume Rowasa enemas once daily 3.  Continue prednisone 40 mg daily.  And taper to 30 mg daily when feeling better 4.  Complete induction series of Remicade as scheduled 5.  ORDER LABORATORIES TODAY TO INCLUDE CBC, COMPREHENSIVE METABOLIC PANEL, CRP - please come by the lab in the basement to have these drawn.  6.  Office follow-up in 4 weeks. I went ahead and scheduled this for 01/01/2019 at 9:00am.  This may need to be changed to a Webex visit if Covid criteria are still in place at this time.  If this time does not work for you just call the office to reschedule. Contact the office in the interim for ANY questions or problems 7.  Happy to assist her with any needed medical excuse regarding her schooling needs

## 2018-11-29 NOTE — Progress Notes (Signed)
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  I last saw the patient via WebEx telemedicine visit October 30, 2018.  At that time she reported being "40% better" since her hospital discharge.  At the time of this most recent visit the patient was having significant symptoms despite oral and topical mesalamine and moderately high dose prednisone.  Prednisone was increased from 40 mg daily to 60 mg daily.  Testing for C. difficile was negative.  Blood work revealed anemia with hemoglobin 8.1.  Comprehensive metabolic panel was remarkable for low albumin 1.9.  Because of abdominal pain and fever patient was sent to the emergency room.  She was found to have acute DVT in the left common and internal iliac veins as well as right peroneal vein and left posterior tibial vein.  She was anticoagulated.  Evaluated by GI for ongoing symptoms.  Dr. Carlean Purl performed a left-sided colonoscopy which revealed severe ulcerative colitis.  Biopsies revealed same.  No evidence for superimposed infection.  Testing for CMV was negative.  She was given Remicade as an inpatient.  She presents now for telehealth medicine follow-up.  She did receive her second induction dose of Remicade on the eighth.  She is anticipating her final induction dose on June 5.  This is scheduled at the hospital.  She is anticipating using outpatient infusion center thereafter.  She is currently on 40 mg of prednisone daily.  Lialda 4.8 g daily.  Never really used mesalamine enemas in earnest.  She tells me that overall she is somewhat better.  Specifically increased energy and no fevers.  No nausea or vomiting.  However, she continues with diarrhea which is described as medium volume.  3-4 loose stools during the day and 3-4 loose stools overnight which interrupts her sleep  pattern.  She will experience some abdominal cramping, typically at night.  She has seen some blood intermittently, though less than previous.  She is now on Eliquis therapy 5 mg twice daily for her DVTs.  No shortness of breath or other non-GI complaints.  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  REVIEW OF SYSTEMS:  All non-GI ROS negative unless otherwise stated in the HPI  Past Medical History:  Diagnosis Date  . Ankle sprain   . Anxiety   . Depression    denies hospitalization or medication  . DVT (deep venous thrombosis) ([place]) 10/31/2018  . Hyponatremia   . Universal ulcerative colitis with rectal bleeding ([place]) 10/31/2018    Past Surgical History:  Procedure Laterality Date  . BIOPSY  10/01/2018   Procedure: BIOPSY;  Surgeon: Doran Stabler, MD;  Location: [place];  Service: Endoscopy;;  . BIOPSY  11/01/2018   Procedure: BIOPSY;  Surgeon: Gatha Mayer, MD;  Location: [place];  Service: Endoscopy;;  . COLONOSCOPY WITH PROPOFOL N/A 10/01/2018   Procedure: COLONOSCOPY WITH PROPOFOL;  Surgeon: Doran Stabler, MD;  Location: [place];  Service: Endoscopy;  Laterality: N/A;  . COLONOSCOPY WITH PROPOFOL N/A 11/01/2018   Procedure: COLONOSCOPY WITH PROPOFOL;  Surgeon: Gatha Mayer, MD;  Location: [facility] ENDOSCOPY;  Service: Endoscopy;  Laterality: N/A;  . VENA CAVA FILTER PLACEMENT N/A 10/31/2018   Procedure: INSERTION VENA-CAVA FILTER;  Surgeon: Rosetta Posner, MD;  Location: [place];  Service:  Vascular;  Laterality: N/A;  . WISDOM TOOTH EXTRACTION      Social History Sharifa Bucholz  reports that she has never smoked. She has never used smokeless tobacco. She reports that she does not drink alcohol or use drugs.  family history includes Diabetes in her mother; Heart disease in her maternal aunt; Hypertension in her father.  No Known Allergies     PHYSICAL EXAMINATION: The patient looks well though somewhat  cushingoid.  Alert and oriented.  Cooperative No additional physical examination information with telehealth visit  ASSESSMENT:  1.  Universal ulcerative colitis.  Severe disease.  Slow improvement on steroids, Lialda, and recent induction doses of Remicade.  Part, the efficacy of her Remicade may be somewhat limited by her hypoalbuminemia.  She could possibly need higher dose therapy.  She may also benefit from the addition of topical mesalamine.  Did discuss with her that her young age and abrupt presentation put her at higher risk for refractory disease.  We touched on the role of surgery, though she is not there at present. 2.  Recent diagnosis of deep vein thrombosis.  Risk factor course is generalized inflammatory state.  Now on Eliquis and had being followed by her primary provider   PLAN:  1.  Continue Lialda 4.8 g daily 2.  Resume Rowasa enemas once daily 3.  Continue prednisone 40 mg daily.  And taper to 30 mg daily when feeling better 4.  Complete induction series of Remicade as scheduled 5.  ORDER LABORATORIES TODAY TO INCLUDE CBC, COMPREHENSIVE METABOLIC PANEL, CRP 6.  Office follow-up in 4 weeks.  Contact the office in the interim for ANY questions or problems 7.  Happy to assist her with any needed medical excuse regarding her schooling needs  This WebEx audiovisual telehealth visit was initiated by the patient and consented for this visit.  She was in her home and I was in my office during the encounter which took approximately 25 minutes and all.  She understands her may be an associated professional charge for this service

## 2018-11-29 NOTE — Telephone Encounter (Signed)
At Dr.Perry's request, spoke to patient and let her know Dr. Henrene Pastor would be contacting her through Webex around 10:20am

## 2018-11-29 NOTE — Addendum Note (Signed)
Addended by: Audrea Muscat on: 11/29/2018 02:42 PM   Modules accepted: Orders

## 2018-11-30 ENCOUNTER — Other Ambulatory Visit (INDEPENDENT_AMBULATORY_CARE_PROVIDER_SITE_OTHER): Payer: BLUE CROSS/BLUE SHIELD

## 2018-11-30 DIAGNOSIS — K51011 Ulcerative (chronic) pancolitis with rectal bleeding: Secondary | ICD-10-CM | POA: Diagnosis not present

## 2018-11-30 LAB — CBC WITH DIFFERENTIAL/PLATELET
Basophils Absolute: 0 10*3/uL (ref 0.0–0.1)
Basophils Relative: 0.6 % (ref 0.0–3.0)
Eosinophils Absolute: 0.1 10*3/uL (ref 0.0–0.7)
Eosinophils Relative: 2.2 % (ref 0.0–5.0)
HCT: 32 % — ABNORMAL LOW (ref 36.0–46.0)
Hemoglobin: 10.6 g/dL — ABNORMAL LOW (ref 12.0–15.0)
Lymphocytes Relative: 35.2 % (ref 12.0–46.0)
Lymphs Abs: 1.9 10*3/uL (ref 0.7–4.0)
MCHC: 33 g/dL (ref 30.0–36.0)
MCV: 79.3 fl (ref 78.0–100.0)
Monocytes Absolute: 0.6 10*3/uL (ref 0.1–1.0)
Monocytes Relative: 11.5 % (ref 3.0–12.0)
Neutro Abs: 2.8 10*3/uL (ref 1.4–7.7)
Neutrophils Relative %: 50.5 % (ref 43.0–77.0)
Platelets: 348 10*3/uL (ref 150.0–400.0)
RBC: 4.04 Mil/uL (ref 3.87–5.11)
RDW: 17 % — ABNORMAL HIGH (ref 11.5–15.5)
WBC: 5.5 10*3/uL (ref 4.0–10.5)

## 2018-11-30 LAB — COMPREHENSIVE METABOLIC PANEL
ALT: 13 U/L (ref 0–35)
AST: 9 U/L (ref 0–37)
Albumin: 3.4 g/dL — ABNORMAL LOW (ref 3.5–5.2)
Alkaline Phosphatase: 57 U/L (ref 39–117)
BUN: 9 mg/dL (ref 6–23)
CO2: 30 mEq/L (ref 19–32)
Calcium: 9.1 mg/dL (ref 8.4–10.5)
Chloride: 99 mEq/L (ref 96–112)
Creatinine, Ser: 0.86 mg/dL (ref 0.40–1.20)
GFR: 77.28 mL/min (ref >60.00)
Glucose, Bld: 126 mg/dL — ABNORMAL HIGH (ref 70–99)
Potassium: 3.2 mEq/L — ABNORMAL LOW (ref 3.5–5.1)
Sodium: 139 mEq/L (ref 135–145)
Total Bilirubin: 0.4 mg/dL (ref 0.2–1.2)
Total Protein: 6.8 g/dL (ref 6.0–8.3)

## 2018-11-30 LAB — HIGH SENSITIVITY CRP: CRP, High Sensitivity: 33.53 mg/L — ABNORMAL HIGH (ref 0.000–5.000)

## 2018-12-11 ENCOUNTER — Telehealth: Payer: Self-pay

## 2018-12-11 NOTE — Telephone Encounter (Signed)
Referral was faxed to [facility] on 11/21/18. Pt is scheduled for last induction dose of Remicade at the hospital on June 5th. Pt is scheduled to see the physician at [facility] on 12/18/18. They will take over the Remicade infusions at that point and next one would be due on 02/08/19. Spoke with Colletta Maryland in their office regarding the remicade.

## 2018-12-11 NOTE — Telephone Encounter (Signed)
-----   Message from Marlon Pel, RN sent at 11/19/2018 11:22 AM EDT ----- Regarding: FW: FYI Remicade Roena Malady this is your new start I worked on while you are out. She is set up for week 2 and 6 then looks like she will have to go to another facility.   ----- Message ----- From: Darden Dates Sent: 11/12/2018   3:25 PM EDT To: Marlon Pel, RN Subject: [place], I know you are out of the office, but you need to know that the patient's Remicade Josem Kaufmann has been approved for a hospital setting for 90 days only.  She must transition to office or home setting per policy after the 90 days. Thanks, Amy

## 2018-12-13 ENCOUNTER — Other Ambulatory Visit: Payer: Self-pay

## 2018-12-13 ENCOUNTER — Ambulatory Visit (INDEPENDENT_AMBULATORY_CARE_PROVIDER_SITE_OTHER): Payer: BC Managed Care – PPO | Admitting: Psychology

## 2018-12-13 DIAGNOSIS — K51011 Ulcerative (chronic) pancolitis with rectal bleeding: Secondary | ICD-10-CM

## 2018-12-13 DIAGNOSIS — F4010 Social phobia, unspecified: Secondary | ICD-10-CM | POA: Diagnosis not present

## 2018-12-13 DIAGNOSIS — F341 Dysthymic disorder: Secondary | ICD-10-CM

## 2018-12-14 ENCOUNTER — Ambulatory Visit (HOSPITAL_COMMUNITY)
Admission: RE | Admit: 2018-12-14 | Discharge: 2018-12-14 | Disposition: A | Discharge status: Home / Self Care | Payer: BC Managed Care – PPO | Source: Ambulatory Visit | Attending: Internal Medicine | Admitting: Internal Medicine

## 2018-12-14 ENCOUNTER — Other Ambulatory Visit: Payer: Self-pay

## 2018-12-14 DIAGNOSIS — K51011 Ulcerative (chronic) pancolitis with rectal bleeding: Secondary | ICD-10-CM | POA: Insufficient documentation

## 2018-12-14 MED ORDER — ACETAMINOPHEN 325 MG PO TABS
650.0000 mg | ORAL_TABLET | Freq: Once | ORAL | Status: AC
  Administered 2018-12-14: 650 mg via ORAL

## 2018-12-14 MED ORDER — SODIUM CHLORIDE 0.9 % IV SOLN
5.0000 mg/kg | Freq: Once | INTRAVENOUS | Status: AC
  Administered 2018-12-14: 300 mg via INTRAVENOUS
  Filled 2018-12-14: qty 30

## 2018-12-14 MED ORDER — DIPHENHYDRAMINE HCL 25 MG PO CAPS
ORAL_CAPSULE | ORAL | Status: AC
  Filled 2018-12-14: qty 2

## 2018-12-14 MED ORDER — DIPHENHYDRAMINE HCL 25 MG PO CAPS
50.0000 mg | ORAL_CAPSULE | Freq: Once | ORAL | Status: AC
  Administered 2018-12-14: 50 mg via ORAL

## 2018-12-14 MED ORDER — ACETAMINOPHEN 325 MG PO TABS
ORAL_TABLET | ORAL | Status: AC
  Filled 2018-12-14: qty 2

## 2018-12-18 DIAGNOSIS — R000 Tachycardia, unspecified: Secondary | ICD-10-CM | POA: Diagnosis not present

## 2018-12-18 DIAGNOSIS — Z79899 Other long term (current) drug therapy: Secondary | ICD-10-CM | POA: Diagnosis not present

## 2018-12-18 DIAGNOSIS — K5190 Ulcerative colitis, unspecified, without complications: Secondary | ICD-10-CM | POA: Diagnosis not present

## 2018-12-27 ENCOUNTER — Other Ambulatory Visit: Payer: Self-pay

## 2018-12-27 ENCOUNTER — Emergency Department (HOSPITAL_COMMUNITY): Payer: BC Managed Care – PPO

## 2018-12-27 ENCOUNTER — Encounter (HOSPITAL_COMMUNITY): Payer: Self-pay | Admitting: Emergency Medicine

## 2018-12-27 ENCOUNTER — Telehealth: Payer: Self-pay | Admitting: Internal Medicine

## 2018-12-27 ENCOUNTER — Inpatient Hospital Stay (HOSPITAL_COMMUNITY)
Admission: EM | Admit: 2018-12-27 | Discharge: 2019-01-03 | DRG: 385 | Disposition: A | Discharge status: Home / Self Care | Payer: BC Managed Care – PPO | Attending: Student | Admitting: Student

## 2018-12-27 DIAGNOSIS — Z20828 Contact with and (suspected) exposure to other viral communicable diseases: Secondary | ICD-10-CM | POA: Diagnosis not present

## 2018-12-27 DIAGNOSIS — D500 Iron deficiency anemia secondary to blood loss (chronic): Secondary | ICD-10-CM | POA: Diagnosis not present

## 2018-12-27 DIAGNOSIS — Z95828 Presence of other vascular implants and grafts: Secondary | ICD-10-CM

## 2018-12-27 DIAGNOSIS — K51011 Ulcerative (chronic) pancolitis with rectal bleeding: Secondary | ICD-10-CM | POA: Diagnosis present

## 2018-12-27 DIAGNOSIS — D638 Anemia in other chronic diseases classified elsewhere: Secondary | ICD-10-CM | POA: Diagnosis present

## 2018-12-27 DIAGNOSIS — N179 Acute kidney failure, unspecified: Secondary | ICD-10-CM | POA: Diagnosis not present

## 2018-12-27 DIAGNOSIS — E860 Dehydration: Secondary | ICD-10-CM | POA: Diagnosis not present

## 2018-12-27 DIAGNOSIS — R001 Bradycardia, unspecified: Secondary | ICD-10-CM | POA: Diagnosis not present

## 2018-12-27 DIAGNOSIS — Z03818 Encounter for observation for suspected exposure to other biological agents ruled out: Secondary | ICD-10-CM | POA: Diagnosis not present

## 2018-12-27 DIAGNOSIS — K921 Melena: Secondary | ICD-10-CM | POA: Diagnosis not present

## 2018-12-27 DIAGNOSIS — I82409 Acute embolism and thrombosis of unspecified deep veins of unspecified lower extremity: Secondary | ICD-10-CM | POA: Diagnosis present

## 2018-12-27 DIAGNOSIS — K51919 Ulcerative colitis, unspecified with unspecified complications: Secondary | ICD-10-CM | POA: Insufficient documentation

## 2018-12-27 DIAGNOSIS — E871 Hypo-osmolality and hyponatremia: Secondary | ICD-10-CM | POA: Diagnosis not present

## 2018-12-27 DIAGNOSIS — R1084 Generalized abdominal pain: Secondary | ICD-10-CM | POA: Diagnosis not present

## 2018-12-27 DIAGNOSIS — R110 Nausea: Secondary | ICD-10-CM | POA: Diagnosis not present

## 2018-12-27 DIAGNOSIS — K5100 Ulcerative (chronic) pancolitis without complications: Secondary | ICD-10-CM

## 2018-12-27 DIAGNOSIS — E1165 Type 2 diabetes mellitus with hyperglycemia: Secondary | ICD-10-CM | POA: Diagnosis not present

## 2018-12-27 DIAGNOSIS — Z7952 Long term (current) use of systemic steroids: Secondary | ICD-10-CM

## 2018-12-27 DIAGNOSIS — R7303 Prediabetes: Secondary | ICD-10-CM

## 2018-12-27 DIAGNOSIS — E876 Hypokalemia: Secondary | ICD-10-CM | POA: Diagnosis present

## 2018-12-27 DIAGNOSIS — I82522 Chronic embolism and thrombosis of left iliac vein: Secondary | ICD-10-CM | POA: Diagnosis present

## 2018-12-27 DIAGNOSIS — Z7901 Long term (current) use of anticoagulants: Secondary | ICD-10-CM

## 2018-12-27 DIAGNOSIS — I82401 Acute embolism and thrombosis of unspecified deep veins of right lower extremity: Secondary | ICD-10-CM | POA: Diagnosis present

## 2018-12-27 DIAGNOSIS — R739 Hyperglycemia, unspecified: Secondary | ICD-10-CM

## 2018-12-27 DIAGNOSIS — T380X5A Adverse effect of glucocorticoids and synthetic analogues, initial encounter: Secondary | ICD-10-CM | POA: Diagnosis not present

## 2018-12-27 DIAGNOSIS — Z8249 Family history of ischemic heart disease and other diseases of the circulatory system: Secondary | ICD-10-CM | POA: Diagnosis not present

## 2018-12-27 DIAGNOSIS — D509 Iron deficiency anemia, unspecified: Secondary | ICD-10-CM | POA: Diagnosis present

## 2018-12-27 DIAGNOSIS — K529 Noninfective gastroenteritis and colitis, unspecified: Secondary | ICD-10-CM | POA: Diagnosis not present

## 2018-12-27 DIAGNOSIS — F418 Other specified anxiety disorders: Secondary | ICD-10-CM | POA: Diagnosis not present

## 2018-12-27 DIAGNOSIS — E43 Unspecified severe protein-calorie malnutrition: Secondary | ICD-10-CM | POA: Diagnosis present

## 2018-12-27 DIAGNOSIS — Z6824 Body mass index (BMI) 24.0-24.9, adult: Secondary | ICD-10-CM | POA: Diagnosis not present

## 2018-12-27 DIAGNOSIS — R109 Unspecified abdominal pain: Secondary | ICD-10-CM | POA: Diagnosis present

## 2018-12-27 DIAGNOSIS — I82423 Acute embolism and thrombosis of iliac vein, bilateral: Secondary | ICD-10-CM | POA: Diagnosis not present

## 2018-12-27 DIAGNOSIS — Z86711 Personal history of pulmonary embolism: Secondary | ICD-10-CM

## 2018-12-27 DIAGNOSIS — Z79899 Other long term (current) drug therapy: Secondary | ICD-10-CM

## 2018-12-27 DIAGNOSIS — R1030 Lower abdominal pain, unspecified: Secondary | ICD-10-CM | POA: Diagnosis not present

## 2018-12-27 HISTORY — DX: Noninfective gastroenteritis and colitis, unspecified: K52.9

## 2018-12-27 LAB — C DIFFICILE QUICK SCREEN W PCR REFLEX
C Diff antigen: NEGATIVE
C Diff interpretation: NOT DETECTED
C Diff toxin: NEGATIVE

## 2018-12-27 LAB — URINALYSIS, ROUTINE W REFLEX MICROSCOPIC
Bilirubin Urine: NEGATIVE
Glucose, UA: NEGATIVE mg/dL
Ketones, ur: NEGATIVE mg/dL
Leukocytes,Ua: NEGATIVE
Nitrite: NEGATIVE
Protein, ur: NEGATIVE mg/dL
Specific Gravity, Urine: 1.004 — ABNORMAL LOW (ref 1.005–1.030)
pH: 5 (ref 5.0–8.0)

## 2018-12-27 LAB — COMPREHENSIVE METABOLIC PANEL
ALT: 14 U/L (ref 0–44)
AST: 14 U/L — ABNORMAL LOW (ref 15–41)
Albumin: 2.6 g/dL — ABNORMAL LOW (ref 3.5–5.0)
Alkaline Phosphatase: 50 U/L (ref 38–126)
Anion gap: 13 (ref 5–15)
BUN: 11 mg/dL (ref 6–20)
CO2: 23 mmol/L (ref 22–32)
Calcium: 8.7 mg/dL — ABNORMAL LOW (ref 8.9–10.3)
Chloride: 96 mmol/L — ABNORMAL LOW (ref 98–111)
Creatinine, Ser: 1.22 mg/dL — ABNORMAL HIGH (ref 0.44–1.00)
GFR calc Af Amer: >60 mL/min (ref >60)
GFR calc non Af Amer: 59 mL/min — ABNORMAL LOW (ref >60)
Glucose, Bld: 157 mg/dL — ABNORMAL HIGH (ref 70–99)
Potassium: 2.8 mmol/L — ABNORMAL LOW (ref 3.5–5.1)
Sodium: 132 mmol/L — ABNORMAL LOW (ref 135–145)
Total Bilirubin: 0.9 mg/dL (ref 0.3–1.2)
Total Protein: 5.6 g/dL — ABNORMAL LOW (ref 6.5–8.1)

## 2018-12-27 LAB — CBC WITH DIFFERENTIAL/PLATELET
Abs Immature Granulocytes: 0 10*3/uL (ref 0.00–0.07)
Basophils Absolute: 0 10*3/uL (ref 0.0–0.1)
Basophils Relative: 0 %
Eosinophils Absolute: 0.1 10*3/uL (ref 0.0–0.5)
Eosinophils Relative: 1 %
HCT: 35.6 % — ABNORMAL LOW (ref 36.0–46.0)
Hemoglobin: 10.7 g/dL — ABNORMAL LOW (ref 12.0–15.0)
Lymphocytes Relative: 21 %
Lymphs Abs: 2.7 10*3/uL (ref 0.7–4.0)
MCH: 23.8 pg — ABNORMAL LOW (ref 26.0–34.0)
MCHC: 30.1 g/dL (ref 30.0–36.0)
MCV: 79.1 fL — ABNORMAL LOW (ref 80.0–100.0)
Monocytes Absolute: 0.8 10*3/uL (ref 0.1–1.0)
Monocytes Relative: 6 %
Neutro Abs: 9.3 10*3/uL — ABNORMAL HIGH (ref 1.7–7.7)
Neutrophils Relative %: 72 %
Platelets: 354 10*3/uL (ref 150–400)
RBC: 4.5 MIL/uL (ref 3.87–5.11)
RDW: 15.3 % (ref 11.5–15.5)
WBC: 12.9 10*3/uL — ABNORMAL HIGH (ref 4.0–10.5)
nRBC: 0 % (ref 0.0–0.2)
nRBC: 0 /100 WBC

## 2018-12-27 LAB — PREGNANCY, URINE: Preg Test, Ur: NEGATIVE

## 2018-12-27 LAB — LIPASE, BLOOD: Lipase: 30 U/L (ref 11–51)

## 2018-12-27 LAB — C-REACTIVE PROTEIN: CRP: 12.5 mg/dL — ABNORMAL HIGH (ref <1.0)

## 2018-12-27 LAB — SEDIMENTATION RATE: Sed Rate: 30 mm/hr — ABNORMAL HIGH (ref 0–22)

## 2018-12-27 MED ORDER — POTASSIUM CHLORIDE IN NACL 40-0.9 MEQ/L-% IV SOLN
INTRAVENOUS | Status: DC
  Administered 2018-12-27: 125 mL/h via INTRAVENOUS
  Filled 2018-12-27 (×2): qty 1000

## 2018-12-27 MED ORDER — IOHEXOL 300 MG/ML  SOLN
100.0000 mL | Freq: Once | INTRAMUSCULAR | Status: AC | PRN
  Administered 2018-12-27: 100 mL via INTRAVENOUS

## 2018-12-27 MED ORDER — METHYLPREDNISOLONE SODIUM SUCC 125 MG IJ SOLR
60.0000 mg | Freq: Two times a day (BID) | INTRAMUSCULAR | Status: DC
  Administered 2018-12-28 – 2019-01-01 (×9): 60 mg via INTRAVENOUS
  Filled 2018-12-27 (×9): qty 2

## 2018-12-27 MED ORDER — LEVONORGESTREL-ETHINYL ESTRAD 0.1-20 MG-MCG PO TABS: 1.0000 | ORAL_TABLET | Freq: Every day | ORAL | Status: DC

## 2018-12-27 MED ORDER — MORPHINE SULFATE (PF) 4 MG/ML IV SOLN
4.0000 mg | Freq: Once | INTRAVENOUS | Status: AC
  Administered 2018-12-27: 4 mg via INTRAVENOUS
  Filled 2018-12-27: qty 1

## 2018-12-27 MED ORDER — ENSURE ENLIVE PO LIQD
237.0000 mL | Freq: Two times a day (BID) | ORAL | Status: DC
  Administered 2018-12-28 – 2019-01-03 (×7): 237 mL via ORAL

## 2018-12-27 MED ORDER — ONDANSETRON HCL 4 MG/2ML IJ SOLN
4.0000 mg | Freq: Four times a day (QID) | INTRAMUSCULAR | Status: DC | PRN
  Administered 2018-12-28: 4 mg via INTRAVENOUS
  Filled 2018-12-27: qty 2

## 2018-12-27 MED ORDER — MESALAMINE 1.2 G PO TBEC
4.8000 g | DELAYED_RELEASE_TABLET | Freq: Every day | ORAL | Status: DC
  Administered 2018-12-28: 4.8 g via ORAL
  Filled 2018-12-27: qty 4

## 2018-12-27 MED ORDER — DEXTROSE-NACL 5-0.9 % IV SOLN
INTRAVENOUS | Status: DC
  Administered 2018-12-28: 02:00:00 via INTRAVENOUS

## 2018-12-27 MED ORDER — ACETAMINOPHEN 500 MG PO TABS: 1000.0000 mg | ORAL_TABLET | Freq: Four times a day (QID) | ORAL | Status: DC | PRN

## 2018-12-27 MED ORDER — POTASSIUM CHLORIDE 10 MEQ/100ML IV SOLN
10.0000 meq | INTRAVENOUS | Status: AC
  Administered 2018-12-27: 10 meq via INTRAVENOUS
  Filled 2018-12-27: qty 100

## 2018-12-27 MED ORDER — METHYLPREDNISOLONE SODIUM SUCC 125 MG IJ SOLR
60.0000 mg | INTRAMUSCULAR | Status: DC
  Administered 2018-12-27: 60 mg via INTRAVENOUS
  Filled 2018-12-27: qty 2

## 2018-12-27 MED ORDER — METOCLOPRAMIDE HCL 5 MG/ML IJ SOLN
10.0000 mg | Freq: Once | INTRAMUSCULAR | Status: AC
  Administered 2018-12-27: 10 mg via INTRAVENOUS
  Filled 2018-12-27: qty 2

## 2018-12-27 MED ORDER — SODIUM CHLORIDE 0.9 % IV BOLUS
2000.0000 mL | Freq: Once | INTRAVENOUS | Status: AC
  Administered 2018-12-27: 2000 mL via INTRAVENOUS

## 2018-12-27 MED ORDER — ONDANSETRON HCL 4 MG PO TABS: 4.0000 mg | ORAL_TABLET | Freq: Four times a day (QID) | ORAL | Status: DC | PRN

## 2018-12-27 MED ORDER — LOPERAMIDE HCL 2 MG PO CAPS
2.0000 mg | ORAL_CAPSULE | Freq: Every day | ORAL | Status: DC | PRN
  Administered 2018-12-28 – 2018-12-31 (×5): 2 mg via ORAL
  Filled 2018-12-27 (×5): qty 1

## 2018-12-27 MED ORDER — ONDANSETRON HCL 4 MG/2ML IJ SOLN
4.0000 mg | Freq: Once | INTRAMUSCULAR | Status: AC
  Administered 2018-12-27: 4 mg via INTRAVENOUS
  Filled 2018-12-27: qty 2

## 2018-12-27 MED ORDER — PANTOPRAZOLE SODIUM 40 MG PO TBEC
40.0000 mg | DELAYED_RELEASE_TABLET | Freq: Every day | ORAL | Status: DC
  Administered 2018-12-28 – 2019-01-03 (×7): 40 mg via ORAL
  Filled 2018-12-27 (×7): qty 1

## 2018-12-27 NOTE — ED Provider Notes (Signed)
[facility] EMERGENCY DEPARTMENT Provider Note   CSN: [id number] Arrival date & time: 12/27/18  1224     History   Chief Complaint Chief Complaint  Patient presents with  . Abdominal Pain  . Fever  . Diarrhea    HPI Malvina Schadler is a 28 y.o. female.     HPI Patient is a 28 year old female with a history of ulcerative colitis who presents to the emergency department with 2 to 3 days of worsening abdominal pain diarrhea and noted to have fever to 104 today.  She reports moderate generalized abdominal pain.  No back pain or flank pain.  No dysuria or urinary frequency.  Called her GI team today who recommended evaluation in the emergency department.  Her gastroenterologist is Dr. Henrene Pastor.  Presents with a heart rate of 136.  Denies cough.  Fever noted over the past 24 hours.  Compliant with her medications.  History of venous thromboembolic disease status post IVC filter in April 2020.  Now on Eliquis.    Past Medical History:  Diagnosis Date  . Ankle sprain   . Anxiety   . Depression    denies hospitalization or medication  . DVT (deep venous thrombosis) ([place]) 10/31/2018  . Hyponatremia   . Universal ulcerative colitis with rectal bleeding ([place]) 10/31/2018    Patient Active Problem List   Diagnosis Date Noted  . DVT (deep venous thrombosis) ([place]) 10/31/2018  . Rectal bleed 10/31/2018  . Universal ulcerative colitis with rectal bleeding ([place]) 10/31/2018  . Hypokalemia 10/31/2018  . Hyponatremia 10/31/2018  . Abdominal pain 09/30/2018  . MDD (major depressive disorder), recurrent severe, without psychosis ([place]) 04/16/2015    Past Surgical History:  Procedure Laterality Date  . BIOPSY  10/01/2018   Procedure: BIOPSY;  Surgeon: Doran Stabler, MD;  Location: [facility];  Service: Endoscopy;;  . BIOPSY  11/01/2018   Procedure: BIOPSY;  Surgeon: Gatha Mayer, MD;  Location: [facility];  Service: Endoscopy;;  . COLONOSCOPY WITH PROPOFOL N/A  10/01/2018   Procedure: COLONOSCOPY WITH PROPOFOL;  Surgeon: Doran Stabler, MD;  Location: [facility];  Service: Endoscopy;  Laterality: N/A;  . COLONOSCOPY WITH PROPOFOL N/A 11/01/2018   Procedure: COLONOSCOPY WITH PROPOFOL;  Surgeon: Gatha Mayer, MD;  Location: [facility] ENDOSCOPY;  Service: Endoscopy;  Laterality: N/A;  . VENA CAVA FILTER PLACEMENT N/A 10/31/2018   Procedure: INSERTION VENA-CAVA FILTER;  Surgeon: Rosetta Posner, MD;  Location: [facility] OR;  Service: Vascular;  Laterality: N/A;  . WISDOM TOOTH EXTRACTION       OB History   No obstetric history on file.      Home Medications    Prior to Admission medications   Medication Sig Start Date End Date Taking? Authorizing Provider  acetaminophen (ACETAMINOPHEN EXTRA STRENGTH) 500 MG tablet Take 1,000 mg by mouth every 6 (six) hours as needed.    [provider]  apixaban (ELIQUIS) 5 MG TABS tablet 2 tabs (1m) PO BID x 7 days, then 1 tab PO BID 11/19/18   Koberlein, Junell C, MD  dicyclomine (BENTYL) 10 MG capsule Take 2 capsules (20 mg total) by mouth 3 (three) times daily before meals. 11/03/18   PLavina Hamman MD  feeding supplement, ENSURE ENLIVE, (ENSURE ENLIVE) LIQD Take 237 mLs by mouth 2 (two) times daily between meals. 11/03/18   PLavina Hamman MD  loperamide (IMODIUM) 2 MG capsule Take 2 mg by mouth daily as needed for diarrhea or loose stools.  [provider]  mesalamine (LIALDA) 1.2 g EC tablet Take 4 tablets (4.8 g total) by mouth daily with breakfast. 10/10/18   Irene Shipper, MD  omeprazole (PRILOSEC) 20 MG capsule Take 1 capsule (20 mg total) by mouth 2 (two) times daily before a meal. 10/26/18   Cirigliano, Vito V, DO  ondansetron (ZOFRAN) 4 MG tablet Take 1 tablet (4 mg total) by mouth every 6 (six) hours. 10/10/18   Irene Shipper, MD  predniSONE (DELTASONE) 20 MG tablet Take 2 tablets (40 mg total) by mouth daily. Use as directed 11/03/18   Lavina Hamman, MD  promethazine (PHENERGAN) 25 MG tablet  Take 1 tablet (25 mg total) by mouth every 6 (six) hours as needed for nausea or vomiting. 10/26/18   Cirigliano, Dominic Pea, DO    Family History Family History  Problem Relation Age of Onset  . Diabetes Mother   . Hypertension Father   . Heart disease Maternal Aunt        enlarged heart  . Colon cancer Neg Hx   . Esophageal cancer Neg Hx   . Stomach cancer Neg Hx   . Pancreatic cancer Neg Hx   . Liver disease Neg Hx   . Inflammatory bowel disease Neg Hx     Social History Social History   Tobacco Use  . Smoking status: Never Smoker  . Smokeless tobacco: Never Used  Substance Use Topics  . Alcohol use: No    Comment: denied alcohol  . Drug use: No    Comment: denied     Allergies   Patient has no known allergies.   Review of Systems Review of Systems  All other systems reviewed and are negative.    Physical Exam Updated Vital Signs BP 101/64   Pulse (!) 106   Temp 98.4 F (36.9 C) (Oral)   Resp 13   Ht 5' 3"  (1.6 m)   Wt 63.5 kg   SpO2 98%   BMI 24.80 kg/m   Physical Exam Vitals signs and nursing note reviewed.  Constitutional:      General: She is not in acute distress.    Appearance: She is well-developed.  HENT:     Head: Normocephalic and atraumatic.  Neck:     Musculoskeletal: Normal range of motion.  Cardiovascular:     Rate and Rhythm: Regular rhythm. Tachycardia present.     Heart sounds: Normal heart sounds.  Pulmonary:     Effort: Pulmonary effort is normal.     Breath sounds: Normal breath sounds.  Abdominal:     General: There is no distension.     Palpations: Abdomen is soft.     Tenderness: There is generalized abdominal tenderness. There is no guarding or rebound.  Musculoskeletal: Normal range of motion.  Skin:    General: Skin is warm and dry.  Neurological:     Mental Status: She is alert and oriented to person, place, and time.  Psychiatric:        Judgment: Judgment normal.      ED Treatments / Results  Labs (all  labs ordered are listed, but only abnormal results are displayed) Labs Reviewed  CBC WITH DIFFERENTIAL/PLATELET - Abnormal; Notable for the following components:      Result Value   WBC 12.9 (*)    Hemoglobin 10.7 (*)    HCT 35.6 (*)    MCV 79.1 (*)    MCH 23.8 (*)    Neutro Abs 9.3 (*)  All other components within normal limits  COMPREHENSIVE METABOLIC PANEL - Abnormal; Notable for the following components:   Sodium 132 (*)    Potassium 2.8 (*)    Chloride 96 (*)    Glucose, Bld 157 (*)    Creatinine, Ser 1.22 (*)    Calcium 8.7 (*)    Total Protein 5.6 (*)    Albumin 2.6 (*)    AST 14 (*)    GFR calc non Af Amer 59 (*)    All other components within normal limits  URINALYSIS, ROUTINE W REFLEX MICROSCOPIC - Abnormal; Notable for the following components:   Specific Gravity, Urine 1.004 (*)    Hgb urine dipstick SMALL (*)    Bacteria, UA RARE (*)    All other components within normal limits  LIPASE, BLOOD  PREGNANCY, URINE    EKG    Radiology No results found.  Procedures Procedures (including critical care time)  Medications Ordered in ED Medications  metoCLOPramide (REGLAN) injection 10 mg (has no administration in time range)  sodium chloride 0.9 % bolus 2,000 mL (2,000 mLs Intravenous New Bag/Given 12/27/18 1307)  morphine 4 MG/ML injection 4 mg (4 mg Intravenous Given 12/27/18 1305)  ondansetron (ZOFRAN) injection 4 mg (4 mg Intravenous Given 12/27/18 1306)     Initial Impression / Assessment and Plan / ED Course  I have reviewed the triage vital signs and the nursing notes.  Pertinent labs & imaging results that were available during my care of the patient were reviewed by me and considered in my medical decision making (see chart for details).        Patient with abdominal tenderness.  CT scan pending to evaluate for complicating feature of ulcerative colitis given fever and abdominal pain.  IV replacement of the potassium.   Care transferred to Dr.  Maryan Rued to follow-up on CT imaging.    Final Clinical Impressions(s) / ED Diagnoses   Final diagnoses:  None    ED Discharge Orders    None       Jola Schmidt, MD 12/27/18 1601

## 2018-12-27 NOTE — ED Provider Notes (Signed)
Ct showing pancolitis consistent with UC.  GI evaluated the patient and has started Solu-Medrol as well as checking for C. difficile.  They may consider sigmoidoscopy in the future if symptoms are not improving with biopsy.  They also may check for CMV.  Patient is getting IV fluids with potassium replacement.  Vital signs remained stable.  Will admit for further care.   Blanchie Dessert, MD 12/27/18 1942

## 2018-12-27 NOTE — Telephone Encounter (Signed)
Pt sister calling back regarding this

## 2018-12-27 NOTE — Telephone Encounter (Signed)
Patient's sister reports that patient is c/o copious amounts of diarrhea.  She had fever this am of 104 after Tylenol at 9:00 is down to 102.  Patient is faint, weak, and passed out almost with ambulation.  Not able to tolerate a diet.  Sister is advised that she should take her to the ED now for evaluation. Azucena Freed, PA notified.

## 2018-12-27 NOTE — Consult Note (Addendum)
[facility] Consult: 3:19 PM 12/27/2018  LOS: 0 days    Referring Provider: Dr Maryan Rued in ED  Primary Care Physician:  [name], DO Primary Gastroenterologist:  Scarlette Shorts, MD     Reason for Consultation:  Fever, bloodier diarrhea,  abd pain in pt with UC.     HPI: Monica Ortega is a 28 y.o. female.  UC diagnosed mid 09/2018.  Bloody stool beginning summer 2019.  Domino pain started winter 2020.    Amitted the third week of March for 4 days at which time she underwent 11/01/18 Colonoscopy revealing severe, pan ulcerative colitis. CMV negative.  Treated with Solu-Medrol and discharged on a 4-week prednisone taper starting at 40 mg/day, Lialda 4.8 g daily.  Previous Cipro and Flagyl were discontinued.  Readmitted 1 month later in late April, for 3 days (discharged 11/03/2018) with extensive bilateral lower extremity DVTs, involving left common iliac vein.  Underwent IVC filter placement 4/22.  During hospitalization year she received maiden dose of Remicade. Discharged on mesalamine 4.8 mg daily.  Prednisone 40 mg, Eliquis 5 mg TID  On 11/16/2018 received 2nd infusion Remicade, 12/14/2018 received her final induction dose of Remicade. At virtual office visit with Dr. Henrene Pastor on 5/21 she felt better, specifically more energy, no fevers, no nausea vomiting.  Unfortunately still having multiple diarrheal stools 3-4 during the day, 3 - 4 at night.  Intermittently seeing blood but less than previous.  Dr. Henrene Pastor wondered whether her hypoalbuminemia was limiting the efficacy of Remicade and was considering future higher dose therapy.  Also considering topical mesalamine.  She was to continue prednisone 40 mg daily, consider taper when she was feeling better.  Patient sister called GI office today saying the patient was  running a fever and not doing well at home, having ongoing diarrhea.  Patient is weak.  She was a little bit nauseated yesterday last night and again this morning she had large amount of blood in her stool.  She has diffuse, moderate level abdominal pain.  She felt dizzy, unsteady on her feet.  Sister called Dr. Blanch Media office and they advised advised patient to proceed to the emergency room. Temperature at the ED 98.4.  Pulse 136, blood pressure 111/80. Hemoglobins 10.7, it was 10.6 3 weeks ago.  MCV remains low at 79.  WBC is 12.9. Sodium low at 132, potassium low at 2.8.  Glucose 157.  Creatinine now 1.2, was 0.8 3 weeks ago.  LFTs normal.  Unremarkable urinalysis.  Urine pregnancy negative  CTAP is ordered, has not been performed yet.  No sick contacts.  Lives with her sister.  Has been practicing social distancing.  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  Does not smoke, does not consume alcoholic beverages  No family history of inflammatory bowel disease      Past Medical History:  Diagnosis Date  . Ankle sprain   . Anxiety   . Depression    denies hospitalization or medication  . DVT (deep venous thrombosis) ([place]) 10/31/2018  .  Hyponatremia   . Universal ulcerative colitis with rectal bleeding ([place]) 10/31/2018    Past Surgical History:  Procedure Laterality Date  . BIOPSY  10/01/2018   Procedure: BIOPSY;  Surgeon: Doran Stabler, MD;  Location: [facility];  Service: Endoscopy;;  . BIOPSY  11/01/2018   Procedure: BIOPSY;  Surgeon: Gatha Mayer, MD;  Location: [facility];  Service: Endoscopy;;  . COLONOSCOPY WITH PROPOFOL N/A 10/01/2018   Procedure: COLONOSCOPY WITH PROPOFOL;  Surgeon: Doran Stabler, MD;  Location: [facility];  Service: Endoscopy;  Laterality: N/A;  . COLONOSCOPY WITH PROPOFOL N/A 11/01/2018   Procedure: COLONOSCOPY WITH PROPOFOL;  Surgeon: Gatha Mayer, MD;  Location: [facility] ENDOSCOPY;  Service: Endoscopy;  Laterality:  N/A;  . VENA CAVA FILTER PLACEMENT N/A 10/31/2018   Procedure: INSERTION VENA-CAVA FILTER;  Surgeon: Rosetta Posner, MD;  Location: [facility] OR;  Service: Vascular;  Laterality: N/A;  . WISDOM TOOTH EXTRACTION      Prior to Admission medications   Medication Sig Start Date End Date Taking? Authorizing Provider  acetaminophen (ACETAMINOPHEN EXTRA STRENGTH) 500 MG tablet Take 1,000 mg by mouth every 6 (six) hours as needed.    [provider]  apixaban (ELIQUIS) 5 MG TABS tablet 2 tabs (29m) PO BID x 7 days, then 1 tab PO BID 11/19/18   Koberlein, Junell C, MD  dicyclomine (BENTYL) 10 MG capsule Take 2 capsules (20 mg total) by mouth 3 (three) times daily before meals. 11/03/18   PLavina Hamman MD  feeding supplement, ENSURE ENLIVE, (ENSURE ENLIVE) LIQD Take 237 mLs by mouth 2 (two) times daily between meals. 11/03/18   PLavina Hamman MD  loperamide (IMODIUM) 2 MG capsule Take 2 mg by mouth daily as needed for diarrhea or loose stools.    [provider]  mesalamine (LIALDA) 1.2 g EC tablet Take 4 tablets (4.8 g total) by mouth daily with breakfast. 10/10/18   PIrene Shipper MD  omeprazole (PRILOSEC) 20 MG capsule Take 1 capsule (20 mg total) by mouth 2 (two) times daily before a meal. 10/26/18   Cirigliano, Vito V, DO  ondansetron (ZOFRAN) 4 MG tablet Take 1 tablet (4 mg total) by mouth every 6 (six) hours. 10/10/18   PIrene Shipper MD  predniSONE (DELTASONE) 20 MG tablet Take 2 tablets (40 mg total) by mouth daily. Use as directed 11/03/18   PLavina Hamman MD  promethazine (PHENERGAN) 25 MG tablet Take 1 tablet (25 mg total) by mouth every 6 (six) hours as needed for nausea or vomiting. 10/26/18   Cirigliano, Vito V, DO    Scheduled Meds:  Infusions:  PRN Meds:    Allergies as of 12/27/2018  . (No Known Allergies)    Family History  Problem Relation Age of Onset  . Diabetes Mother   . Hypertension Father   . Heart disease Maternal Aunt        enlarged heart  . Colon  cancer Neg Hx   . Esophageal cancer Neg Hx   . Stomach cancer Neg Hx   . Pancreatic cancer Neg Hx   . Liver disease Neg Hx   . Inflammatory bowel disease Neg Hx     Social History   Socioeconomic History  . Marital status: Significant Other    Spouse name: Not on file  . Number of children: Not on file  . Years of education: Not on file  . Highest education level: Not on file  Occupational History  .  Occupation: Assitant   Social Needs  . Financial resource strain: Not on file  . Food insecurity    Worry: Not on file    Inability: Not on file  . Transportation needs    Medical: Not on file    Non-medical: Not on file  Tobacco Use  . Smoking status: Never Smoker  . Smokeless tobacco: Never Used  Substance and Sexual Activity  . Alcohol use: No    Comment: denied alcohol  . Drug use: No    Comment: denied  . Sexual activity: Yes    Birth control/protection: Pill, None  Lifestyle  . Physical activity    Days per week: Not on file    Minutes per session: Not on file  . Stress: Not on file  Relationships  . Social Herbalist on phone: Not on file    Gets together: Not on file    Attends religious service: Not on file    Active member of club or organization: Not on file    Attends meetings of clubs or organizations: Not on file    Relationship status: Not on file  . Intimate partner violence    Fear of current or ex partner: Not on file    Emotionally abused: Not on file    Physically abused: Not on file    Forced sexual activity: Not on file  Other Topics Concern  . 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 ENT:  No nose bleeds Pulm: Difficulty breathing, no cough. CV: Heart races when she gets up to move around today.  Chest pain.  No lower extremity edema. GU:  No hematuria, no frequency GI: HPI.  Prior to yesterday, she was eating pretty well, had not lost any weight. Heme: The stools.  No other unusual bleeding or bruising. Transfusions: None Musculoskeletal: Her back feels a little bit achy from laying in bed.  No leg pain or swelling Neuro: Dizziness.  No headaches, no syncope. Derm:  No itching, no rash or sores.  Endocrine:  No sweats or chills.  No polyuria or dysuria Immunization: Received influenza vaccination 04/2018. Travel:  None beyond local counties in last few months.    PHYSICAL EXAM: Vital signs in last 24 hours: Vitals:   12/27/18 1345 12/27/18 1452  BP:  101/64  Pulse: (!) 110 (!) 106  Resp: 16 13  Temp:    SpO2: 97% 98%   Wt Readings from Last 3 Encounters:  12/27/18 63.5 kg  12/14/18 64 kg  11/27/18 63.8 kg    General: Pale, alert, nontoxic appearing but looks moderately ill. Head: Patient asymmetry or swelling.  No signs of head trauma. Eyes: No scleral icterus.  No conjunctival pallor.  EOMI. Ears: Not hard of hearing Nose: No discharge or congestion Mouth: Pink, moist, clear oral mucosa.  Tongue midline.  Good dentition. Neck: No JVD, no masses, no thyromegaly. Lungs: Clear bilaterally.  No cough, no labored breathing. Heart: Regular, mild tachycardia.  No MRG.  S1, S2 present  Abdomen: Soft.  Diffusely tender.   Rectal: Deferred Musc/Skeltl: No joint redness, or swelling Extremities: No CCE. Neurologic: Alert.  Oriented x3.  Moves all 4 limbs with full strength.  No tremors. Skin: No rash, no sores, no telangiectasia. Nodes: No cervical or inguinal adenopathy. Psych: Cooperative, pleasant, good historian, fluid speech.  Affect flat.  Intake/Output from previous day: No intake/output data recorded. Intake/Output this shift: No  intake/output data recorded.  LAB RESULTS: Recent Labs    12/27/18 1246  WBC 12.9*  HGB 10.7*  HCT 35.6*  PLT 354   BMET Lab Results  Component Value Date   NA 132 (L) 12/27/2018   NA 139 11/30/2018   NA 139 11/03/2018   K 2.8 (L) 12/27/2018   K 3.2 (L) 11/30/2018   K 3.2 (L) 11/03/2018   CL 96 (L) 12/27/2018   CL 99 11/30/2018   CL 106 11/03/2018   CO2 23 12/27/2018   CO2 30 11/30/2018   CO2 26 11/03/2018   GLUCOSE 157 (H) 12/27/2018   GLUCOSE 126 (H) 11/30/2018   GLUCOSE 106 (H) 11/03/2018   BUN 11 12/27/2018   BUN 9 11/30/2018   BUN <5 (L) 11/03/2018   CREATININE 1.22 (H) 12/27/2018   CREATININE 0.86 11/30/2018   CREATININE 0.66 11/03/2018   CALCIUM 8.7 (L) 12/27/2018   CALCIUM 9.1 11/30/2018   CALCIUM 8.4 (L) 11/03/2018   LFT Recent Labs    12/27/18 1246  PROT 5.6*  ALBUMIN 2.6*  AST 14*  ALT 14  ALKPHOS 50  BILITOT 0.9    No components found for: CDIFF Lipase     Component Value Date/Time   LIPASE 30 12/27/2018 1246    Drugs of Abuse     Component Value Date/Time   LABOPIA NONE DETECTED 04/16/2015 1800   COCAINSCRNUR NONE DETECTED 04/16/2015 1800   LABBENZ NONE DETECTED 04/16/2015 1800   AMPHETMU NONE DETECTED 04/16/2015 1800   THCU NONE DETECTED 04/16/2015 1800   LABBARB NONE DETECTED 04/16/2015 1800     RADIOLOGY STUDIES: No results found.    IMPRESSION:   Severe ulcerative colitis.  Diarrhea persists despite induction doses of Remicade in late April, early May, early June, prednisone, Lialda. Now with fever, more bloody stool, increased abdominal pain.  *    Extensive bilateral DVT.  S/p IVC filter 4/22 Denies any swelling or pain in her legs, no shortness of breath. Note that at presentation for the DVT in April, she was having fever for about a week.  *   Protein malnutrition, low albumin.  *   Stable, microcytic anemia.  Iron deficient . Received Feraheme on 4/23 and 4/25.  *   Dehydration, AKI, hypokalemia.  2 runs  potassium ordered by the ED physician.    PLAN:     *   Admit to hospitalist service  *   Await CTAP with IV and oral contrast.    *   IV Solu-Medrol 60 mg every 24 hours ordered.  *    She has completed bolus of 2 L of sodium chloride, will start sodium chloride with 40 meq potassium at 150 ml/hour.  Clear liquids, advnce as tolerated.      *   bmet in AM.    Azucena Freed  12/27/2018, 3:19 PM Phone [phone]     Attending Physician Note   I have taken a history, examined the patient and reviewed the chart. I agree with the Advanced Practitioner's note, impression and recommendations.  Impression: *  Recently diagnosed ulcerative pancolitis post Remicade x 2 with worsening bloody diarrhea, abdominal pain and fever. UC flare. R/O superimposed infection.  * Extensive bilateral DVTs, S/P IVC * AKI due to volume depletion  * Hypokalemia  Recommendations: CT AP today GI pathogen panel, C diff  CRP, ESR Solu-Medrol 60 mg IV daily Vigorous IV hydration Bowel rest Correct K+ Consider flex sig with biopsies, R/O CMV, pending course   Lucio Edward, MD [facility] [phone]

## 2018-12-27 NOTE — ED Notes (Signed)
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 Yes   Triage Complete: Triage complete  Chief Complaint FEVER,ABD PAIN,DIARRHEA  Triage Note Pt arrives from home. Pt complaining of abdominal pain, diarrhea, and fever of 104. Pt states she has a history of ulcerative colitis.   Allergies No Known Allergies  Level of Care/Admitting Diagnosis ED Disposition    ED Disposition Condition [facility] Area: [place] [[id number]]  Level of Care: Telemetry Medical [104]  Covid Evaluation: N/A  Diagnosis: Ulcerative colitis with complication [facility]) [[id number]]  Admitting Physician: Elwyn Reach [[id number]]  Attending Physician: Elwyn Reach [[id number]]  Estimated length of stay: past midnight tomorrow  Certification:: I certify this patient will need inpatient services for at least 2 midnights  PT Class (Do Not Modify): Inpatient [101]  PT Acc Code (Do Not Modify): Private [1]       B Medical/Surgery History Past Medical History:  Diagnosis Date  . Ankle sprain   . Anxiety   . Depression    denies hospitalization or medication  . DVT (deep venous thrombosis) ([place]) 10/31/2018  . Hyponatremia   . Universal ulcerative colitis with rectal bleeding ([place]) 10/31/2018   Past Surgical History:  Procedure Laterality Date  . BIOPSY  10/01/2018   Procedure: BIOPSY;  Surgeon: Doran Stabler, MD;  Location: [place];  Service: Endoscopy;;  . BIOPSY  11/01/2018   Procedure: BIOPSY;  Surgeon: Gatha Mayer, MD;  Location: [place];  Service: Endoscopy;;  . COLONOSCOPY WITH PROPOFOL N/A 10/01/2018   Procedure: COLONOSCOPY WITH PROPOFOL;  Surgeon: Doran Stabler, MD;  Location: [place];  Service: Endoscopy;  Laterality:  N/A;  . COLONOSCOPY WITH PROPOFOL N/A 11/01/2018   Procedure: COLONOSCOPY WITH PROPOFOL;  Surgeon: Gatha Mayer, MD;  Location: [facility] ENDOSCOPY;  Service: Endoscopy;  Laterality: N/A;  . VENA CAVA FILTER PLACEMENT N/A 10/31/2018   Procedure: INSERTION VENA-CAVA FILTER;  Surgeon: Rosetta Posner, MD;  Location: [place];  Service: Vascular;  Laterality: N/A;  . WISDOM TOOTH EXTRACTION       A IV Location/Drains/Wounds Patient Lines/Drains/Airways Status   Active Line/Drains/Airways    Name:   Placement date:   Placement time:   Site:   Days:   Peripheral IV 11/01/18 Left;Lateral Wrist   11/01/18    0449    Wrist   56   Peripheral IV 12/27/18 Left Antecubital   12/27/18    1257    Antecubital   less than 1   Incision (Closed) 10/31/18 Groin Right   10/31/18    1435     57          Intake/Output Last 24 hours  Intake/Output Summary (Last 24 hours) at 12/27/2018 2046 Last data filed at 12/27/2018 1719 Gross per 24 hour  Intake 2100 ml  Output -  Net 2100 ml    Labs/Imaging Results for orders placed or performed during the hospital encounter of 12/27/18 (from the past 48 hour(s))  CBC with Differential/Platelet     Status: Abnormal   Collection Time: 12/27/18 12:46 PM  Result Value Ref Range   WBC 12.9 (H) 4.0 - 10.5 K/uL   RBC 4.50 3.87 - 5.11 MIL/uL   Hemoglobin 10.7 (L) 12.0 -  15.0 g/dL   HCT 35.6 (L) 36.0 - 46.0 %   MCV 79.1 (L) 80.0 - 100.0 fL   MCH 23.8 (L) 26.0 - 34.0 pg   MCHC 30.1 30.0 - 36.0 g/dL   RDW 15.3 11.5 - 15.5 %   Platelets 354 150 - 400 K/uL   nRBC 0.0 0.0 - 0.2 %   Neutrophils Relative % 72 %   Neutro Abs 9.3 (H) 1.7 - 7.7 K/uL   Lymphocytes Relative 21 %   Lymphs Abs 2.7 0.7 - 4.0 K/uL   Monocytes Relative 6 %   Monocytes Absolute 0.8 0.1 - 1.0 K/uL   Eosinophils Relative 1 %   Eosinophils Absolute 0.1 0.0 - 0.5 K/uL   Basophils Relative 0 %   Basophils Absolute 0.0 0.0 - 0.1 K/uL   nRBC 0 0 /100 WBC   Abs Immature Granulocytes 0.00 0.00 - 0.07 K/uL    Tear Drop Cells PRESENT    Polychromasia PRESENT     Comment: Performed at [facility], [place] [address], [place], [place] [address]  Comprehensive metabolic panel     Status: Abnormal   Collection Time: 12/27/18 12:46 PM  Result Value Ref Range   Sodium 132 (L) 135 - 145 mmol/L   Potassium 2.8 (L) 3.5 - 5.1 mmol/L   Chloride 96 (L) 98 - 111 mmol/L   CO2 23 22 - 32 mmol/L   Glucose, Bld 157 (H) 70 - 99 mg/dL   BUN 11 6 - 20 mg/dL   Creatinine, Ser 1.22 (H) 0.44 - 1.00 mg/dL   Calcium 8.7 (L) 8.9 - 10.3 mg/dL   Total Protein 5.6 (L) 6.5 - 8.1 g/dL   Albumin 2.6 (L) 3.5 - 5.0 g/dL   AST 14 (L) 15 - 41 U/L   ALT 14 0 - 44 U/L   Alkaline Phosphatase 50 38 - 126 U/L   Total Bilirubin 0.9 0.3 - 1.2 mg/dL   GFR calc non Af Amer 59 (L) >60 mL/min   GFR calc Af Amer >60 >60 mL/min   Anion gap 13 5 - 15    Comment: Performed at [facility], [place] [address] [place], [place] [address]  Lipase, blood     Status: None   Collection Time: 12/27/18 12:46 PM  Result Value Ref Range   Lipase 30 11 - 51 U/L    Comment: Performed at [facility], [place] [address], [place], [place] [address]  Urinalysis, Routine w reflex microscopic     Status: Abnormal   Collection Time: 12/27/18  2:45 PM  Result Value Ref Range   Color, Urine YELLOW YELLOW   APPearance CLEAR CLEAR   Specific Gravity, Urine 1.004 (L) 1.005 - 1.030   pH 5.0 5.0 - 8.0   Glucose, UA NEGATIVE NEGATIVE mg/dL   Hgb urine dipstick SMALL (A) NEGATIVE   Bilirubin Urine NEGATIVE NEGATIVE   Ketones, ur NEGATIVE NEGATIVE mg/dL   Protein, ur NEGATIVE NEGATIVE mg/dL   Nitrite NEGATIVE NEGATIVE   Leukocytes,Ua NEGATIVE NEGATIVE   RBC / HPF 0-5 0 - 5 RBC/hpf   WBC, UA 0-5 0 - 5 WBC/hpf   Bacteria, UA RARE (A) NONE SEEN   Squamous Epithelial / LPF 0-5 0 - 5   Mucus PRESENT    Hyaline Casts, UA PRESENT     Comment: Performed at [facility], [address], [place], [place] [address]  Pregnancy, urine     Status:  None  Collection Time: 12/27/18  2:45 PM  Result Value Ref Range   Preg Test, Ur NEGATIVE NEGATIVE    Comment:        THE SENSITIVITY OF THIS METHODOLOGY IS >20 mIU/mL. Performed at [facility], [place] [address], [place], [place] [address]   C-reactive protein     Status: Abnormal   Collection Time: 12/27/18  5:04 PM  Result Value Ref Range   CRP 12.5 (H) <1.0 mg/dL    Comment: Performed at [facility], [place] [address] [place], [place] [address]  Sedimentation rate     Status: Abnormal   Collection Time: 12/27/18  5:04 PM  Result Value Ref Range   Sed Rate 30 (H) 0 - 22 mm/hr    Comment: Performed at [place] [address], [place], [place] [address]  C Difficile Quick Screen w PCR reflex     Status: None   Collection Time: 12/27/18  5:27 PM   Specimen: Stool  Result Value Ref Range   C Diff antigen NEGATIVE NEGATIVE   C Diff toxin NEGATIVE NEGATIVE   C Diff interpretation No C. difficile detected.     Comment: Performed at [facility], [place] [address] [place], [place] [address]   Ct Abdomen Pelvis W Contrast  Result Date: 12/27/2018 CLINICAL DATA:  Abdomen pain EXAM: CT ABDOMEN AND PELVIS WITH CONTRAST TECHNIQUE: Multidetector CT imaging of the abdomen and pelvis was performed using the standard protocol following bolus administration of intravenous contrast. CONTRAST:  168m OMNIPAQUE IOHEXOL 300 MG/ML  SOLN COMPARISON:  CT 10/31/2018, 09/29/2018 FINDINGS: Lower chest: No acute abnormality. Hepatobiliary: No focal liver abnormality is seen. No gallstones, gallbladder wall thickening, or biliary dilatation. Pancreas: Unremarkable. No pancreatic ductal dilatation or surrounding inflammatory changes. Spleen: Normal in size without focal abnormality. Adrenals/Urinary Tract: Adrenal glands are unremarkable. Kidneys are normal, without renal calculi, focal lesion, or hydronephrosis. Bladder is unremarkable. Stomach/Bowel: Stomach is within normal limits. No  dilated small bowel. Featureless colon with wall thickening and mild surrounding inflammatory change consistent with pancolitis. Vascular/Lymphatic: Nonaneurysmal aorta. Interval placement of IVC filter at L2-L3 level. Chronic DVT in the left common iliac vein. Reproductive: Uterus and bilateral adnexa are unremarkable. Other: Small free fluid in the pelvis.  No free air Musculoskeletal: No acute or significant osseous findings. IMPRESSION: 1. Pancolitis, consistent with history of ulcerative colitis. Negative for obstruction or perforation. 2. Small free fluid in the pelvis 3. Interim placement of IVC filter. Chronic DVT in the left common iliac vein. Electronically Signed   By: KDonavan FoilM.D.   On: 12/27/2018 19:35    Pending Labs Unresulted Labs (From admission, onward)    Start     Ordered   12/28/18 03818 Basic metabolic panel  Tomorrow morning,   R     12/27/18 1619   12/27/18 1940  Novel Coronavirus,NAA,(SEND-OUT TO REF LAB - TAT 24-48 hrs); Hosp Order  (Asymptomatic Patients Labs)  Once,   STAT    Question:  Rule Out  Answer:  Yes   12/27/18 1939   12/27/18 1651  Gastrointestinal Panel by PCR , Stool  (Gastrointestinal Panel by PCR, Stool)  Once,   STAT     12/27/18 1650   Signed and Held  Comprehensive metabolic panel  Tomorrow morning,   R     Signed and Held   Signed and Held  CBC  Tomorrow morning,   R     Signed and Held  Vitals/Pain Today's Vitals   12/27/18 1745 12/27/18 1800 12/27/18 1830 12/27/18 2000  BP: 107/71 105/65 104/66 105/70  Pulse: 91 92 88 88  Resp: 15 16 15 12   Temp:      TempSrc:      SpO2: 97% 97% 97% 96%  Weight:      Height:      PainSc:   7      Isolation Precautions Enteric precautions (UV disinfection)  Medications Medications  potassium chloride 10 mEq in 100 mL IVPB (10 mEq Intravenous Not Given 12/27/18 1749)  methylPREDNISolone sodium succinate (SOLU-MEDROL) 125 mg/2 mL injection 60 mg (60 mg Intravenous Given 12/27/18 1710)   0.9 % NaCl with KCl 40 mEq / L  infusion (125 mL/hr Intravenous New Bag/Given 12/27/18 1716)  sodium chloride 0.9 % bolus 2,000 mL (0 mLs Intravenous Stopped 12/27/18 1614)  morphine 4 MG/ML injection 4 mg (4 mg Intravenous Given 12/27/18 1305)  ondansetron (ZOFRAN) injection 4 mg (4 mg Intravenous Given 12/27/18 1306)  metoCLOPramide (REGLAN) injection 10 mg (10 mg Intravenous Given 12/27/18 1607)  iohexol (OMNIPAQUE) 300 MG/ML solution 100 mL (100 mLs Intravenous Contrast Given 12/27/18 1844)  morphine 4 MG/ML injection 4 mg (4 mg Intravenous Given 12/27/18 1923)    Mobility walks Low fall risk   Focused Assessments .   R Recommendations: See Admitting Provider Note  Report given to:   Additional Notes:

## 2018-12-27 NOTE — Telephone Encounter (Signed)
Patient sister called said that the patient is running a fever of 104. And she has U/C and just had a BM accident and would like to know what to do.   [phone] [name] the sister

## 2018-12-27 NOTE — ED Triage Notes (Signed)
Pt arrives from home. Pt complaining of abdominal pain, diarrhea, and fever of 104. Pt states she has a history of ulcerative colitis.

## 2018-12-27 NOTE — H&P (Signed)
History and Physical   Monica Ortega VQQ:[id number] DOB: Aug 02, 1990 DOA: 12/27/2018  Referring MD/NP/PA: Dr Maryan Rued  PCP: [name], DO   Outpatient Specialists: Dr Scarlette Shorts, GI   Patient coming from: Home  Chief Complaint: Abdominal pain and Fever, Diaarhea  HPI: Monica Ortega is a 28 y.o. female with medical history significant of ulcerative colitis and recent DVT with IVC filter in place and on Eliquis, history of depression with anxiety who presents to the ER with worsening fever abdominal pain and diarrhea.  Patient appears to be having flareup of her ulcerative colitis.  Evaluation so far showed pancolitis.  GI has been consulted and has evaluated the patient.  Patient is being admitted with flareup of ulcerative colitis.  She denied any nausea or vomiting.  Patient has seen blood in her diarrhea.  She has had multiple stools today.  Abdominal pain is also generalized and rated as 8 out of 10 at the outset.  Currently down to 2 out of 10.  She recorded temperature 104 at home.  Patient is currently stable and will be admitted for treatment..  ED Course: Temperature now is 98.4 blood pressure 124/82, pulse is 136 respiratory rate of 29 oxygen sat 95% room air.  White count is 12.9 with hemoglobin 10.7 and platelet 354.  Sodium 132 potassium 2.8 chloride 96 CO2 23 with creatinine 1.22 and calcium 8.7.  Glucose 157.  Urinalysis is negative.  Stool associated negative for C. difficile.  CRP 12.5 and sed rate of 30.  CT abdomen pelvis showed pancolitis.  Patient initiated on steroids and being admitted for treatment.  Review of Systems: As per HPI otherwise 10 point review of systems negative.    Past Medical History:  Diagnosis Date  . Ankle sprain   . Anxiety   . Depression    denies hospitalization or medication  . DVT (deep venous thrombosis) ([place]) 10/31/2018  . Hyponatremia   . Universal ulcerative colitis with rectal bleeding ([place]) 10/31/2018    Past Surgical History:   Procedure Laterality Date  . BIOPSY  10/01/2018   Procedure: BIOPSY;  Surgeon: Doran Stabler, MD;  Location: [place];  Service: Endoscopy;;  . BIOPSY  11/01/2018   Procedure: BIOPSY;  Surgeon: Gatha Mayer, MD;  Location: [place];  Service: Endoscopy;;  . COLONOSCOPY WITH PROPOFOL N/A 10/01/2018   Procedure: COLONOSCOPY WITH PROPOFOL;  Surgeon: Doran Stabler, MD;  Location: [place];  Service: Endoscopy;  Laterality: N/A;  . COLONOSCOPY WITH PROPOFOL N/A 11/01/2018   Procedure: COLONOSCOPY WITH PROPOFOL;  Surgeon: Gatha Mayer, MD;  Location: [facility] ENDOSCOPY;  Service: Endoscopy;  Laterality: N/A;  . VENA CAVA FILTER PLACEMENT N/A 10/31/2018   Procedure: INSERTION VENA-CAVA FILTER;  Surgeon: Rosetta Posner, MD;  [facility];  Service: Vascular;  Laterality: N/A;  . WISDOM TOOTH EXTRACTION       reports that she has never smoked. She has never used smokeless tobacco. She reports that she does not drink alcohol or use drugs.  No Known Allergies  Family History  Problem Relation Age of Onset  . Diabetes Mother   . Hypertension Father   . Heart disease Maternal Aunt        enlarged heart  . Colon cancer Neg Hx   . Esophageal cancer Neg Hx   . Stomach cancer Neg Hx   . Pancreatic cancer Neg Hx   . Liver disease Neg Hx   . Inflammatory bowel disease Neg Hx  Prior to Admission medications   Medication Sig Start Date End Date Taking? Authorizing Provider  levonorgestrel-ethinyl estradiol (ALESSE) 0.1-20 MG-MCG tablet Take 1 tablet by mouth daily.   Yes [provider]  acetaminophen (ACETAMINOPHEN EXTRA STRENGTH) 500 MG tablet Take 1,000 mg by mouth every 6 (six) hours as needed.    [provider]  apixaban (ELIQUIS) 5 MG TABS tablet 2 tabs (72m) PO BID x 7 days, then 1 tab PO BID 11/19/18   Koberlein, Junell C, MD  dicyclomine (BENTYL) 10 MG capsule Take 2 capsules (20 mg total) by mouth 3 (three) times daily before meals. Patient not  taking: Reported on 12/27/2018 11/03/18   PLavina Hamman MD  feeding supplement, ENSURE ENLIVE, (ENSURE ENLIVE) LIQD Take 237 mLs by mouth 2 (two) times daily between meals. 11/03/18   PLavina Hamman MD  loperamide (IMODIUM) 2 MG capsule Take 2 mg by mouth daily as needed for diarrhea or loose stools.    [provider]  mesalamine (LIALDA) 1.2 g EC tablet Take 4 tablets (4.8 g total) by mouth daily with breakfast. 10/10/18   PIrene Shipper MD  omeprazole (PRILOSEC) 20 MG capsule Take 1 capsule (20 mg total) by mouth 2 (two) times daily before a meal. 10/26/18   Cirigliano, Vito V, DO  ondansetron (ZOFRAN) 4 MG tablet Take 1 tablet (4 mg total) by mouth every 6 (six) hours. Patient not taking: Reported on 12/27/2018 10/10/18   PIrene Shipper MD  predniSONE (DELTASONE) 20 MG tablet Take 2 tablets (40 mg total) by mouth daily. Use as directed 11/03/18   PLavina Hamman MD  promethazine (PHENERGAN) 25 MG tablet Take 1 tablet (25 mg total) by mouth every 6 (six) hours as needed for nausea or vomiting. Patient not taking: Reported on 12/27/2018 10/26/18   CLavena Bullion DO    Physical Exam: Vitals:   12/27/18 1745 12/27/18 1800 12/27/18 1830 12/27/18 2000  BP: 107/71 105/65 104/66 105/70  Pulse: 91 92 88 88  Resp: 15 16 15 12   Temp:      TempSrc:      SpO2: 97% 97% 97% 96%  Weight:      Height:          Constitutional: NAD, acutely ill looking Vitals:   12/27/18 1745 12/27/18 1800 12/27/18 1830 12/27/18 2000  BP: 107/71 105/65 104/66 105/70  Pulse: 91 92 88 88  Resp: 15 16 15 12   Temp:      TempSrc:      SpO2: 97% 97% 97% 96%  Weight:      Height:       Eyes: PERRL, lids and conjunctivae normal ENMT: Mucous membranes are moist. Posterior pharynx clear of any exudate or lesions.Normal dentition.  Neck: normal, supple, no masses, no thyromegaly Respiratory: clear to auscultation bilaterally, no wheezing, no crackles. Normal respiratory effort. No accessory muscle use.   Cardiovascular: Regular rate and rhythm, no murmurs / rubs / gallops. No extremity edema. 2+ pedal pulses. No carotid bruits.  Abdomen: Diffuse abdominal tenderness, no masses palpated. No hepatosplenomegaly. Bowel sounds positive.  Musculoskeletal: no clubbing / cyanosis. No joint deformity upper and lower extremities. Good ROM, no contractures. Normal muscle tone.  Skin: no rashes, lesions, ulcers. No induration Neurologic: CN 2-12 grossly intact. Sensation intact, DTR normal. Strength 5/5 in all 4.  Psychiatric: Normal judgment and insight. Alert and oriented x 3. Normal mood.     Labs on Admission: I have personally reviewed following labs and  imaging studies  CBC: Recent Labs  Lab 12/27/18 1246  WBC 12.9*  NEUTROABS 9.3*  HGB 10.7*  HCT 35.6*  MCV 79.1*  PLT 662   Basic Metabolic Panel: Recent Labs  Lab 12/27/18 1246  NA 132*  K 2.8*  CL 96*  CO2 23  GLUCOSE 157*  BUN 11  CREATININE 1.22*  CALCIUM 8.7*   GFR: Estimated Creatinine Clearance: 60.5 mL/min (A) (by C-G formula based on SCr of 1.22 mg/dL (H)). Liver Function Tests: Recent Labs  Lab 12/27/18 1246  AST 14*  ALT 14  ALKPHOS 50  BILITOT 0.9  PROT 5.6*  ALBUMIN 2.6*   Recent Labs  Lab 12/27/18 1246  LIPASE 30   No results for input(s): AMMONIA in the last 168 hours. Coagulation Profile: No results for input(s): INR, PROTIME in the last 168 hours. Cardiac Enzymes: No results for input(s): CKTOTAL, CKMB, CKMBINDEX, TROPONINI in the last 168 hours. BNP (last 3 results) No results for input(s): PROBNP in the last 8760 hours. HbA1C: No results for input(s): HGBA1C in the last 72 hours. CBG: No results for input(s): GLUCAP in the last 168 hours. Lipid Profile: No results for input(s): CHOL, HDL, LDLCALC, TRIG, CHOLHDL, LDLDIRECT in the last 72 hours. Thyroid Function Tests: No results for input(s): TSH, T4TOTAL, FREET4, T3FREE, THYROIDAB in the last 72 hours. Anemia Panel: No results for  input(s): VITAMINB12, FOLATE, FERRITIN, TIBC, IRON, RETICCTPCT in the last 72 hours. Urine analysis:    Component Value Date/Time   COLORURINE YELLOW 12/27/2018 1445   APPEARANCEUR CLEAR 12/27/2018 1445   LABSPEC 1.004 (L) 12/27/2018 1445   PHURINE 5.0 12/27/2018 1445   GLUCOSEU NEGATIVE 12/27/2018 1445   HGBUR SMALL (A) 12/27/2018 1445   BILIRUBINUR NEGATIVE 12/27/2018 1445   KETONESUR NEGATIVE 12/27/2018 1445   PROTEINUR NEGATIVE 12/27/2018 1445   NITRITE NEGATIVE 12/27/2018 1445   LEUKOCYTESUR NEGATIVE 12/27/2018 1445   Sepsis Labs: @LABRCNTIP (procalcitonin:4,lacticidven:4) ) Recent Results (from the past 240 hour(s))  C Difficile Quick Screen w PCR reflex     Status: None   Collection Time: 12/27/18  5:27 PM   Specimen: Stool  Result Value Ref Range Status   C Diff antigen NEGATIVE NEGATIVE Final   C Diff toxin NEGATIVE NEGATIVE Final   C Diff interpretation No C. difficile detected.  Final    Comment: Performed at [facility], [place] [address], [place], [place] [address]     Radiological Exams on Admission: Ct Abdomen Pelvis W Contrast  Result Date: 12/27/2018 CLINICAL DATA:  Abdomen pain EXAM: CT ABDOMEN AND PELVIS WITH CONTRAST TECHNIQUE: Multidetector CT imaging of the abdomen and pelvis was performed using the standard protocol following bolus administration of intravenous contrast. CONTRAST:  156m OMNIPAQUE IOHEXOL 300 MG/ML  SOLN COMPARISON:  CT 10/31/2018, 09/29/2018 FINDINGS: Lower chest: No acute abnormality. Hepatobiliary: No focal liver abnormality is seen. No gallstones, gallbladder wall thickening, or biliary dilatation. Pancreas: Unremarkable. No pancreatic ductal dilatation or surrounding inflammatory changes. Spleen: Normal in size without focal abnormality. Adrenals/Urinary Tract: Adrenal glands are unremarkable. Kidneys are normal, without renal calculi, focal lesion, or hydronephrosis. Bladder is unremarkable. Stomach/Bowel: Stomach is within normal  limits. No dilated small bowel. Featureless colon with wall thickening and mild surrounding inflammatory change consistent with pancolitis. Vascular/Lymphatic: Nonaneurysmal aorta. Interval placement of IVC filter at L2-L3 level. Chronic DVT in the left common iliac vein. Reproductive: Uterus and bilateral adnexa are unremarkable. Other: Small free fluid in the pelvis.  No free air Musculoskeletal: No acute or significant  osseous findings. IMPRESSION: 1. Pancolitis, consistent with history of ulcerative colitis. Negative for obstruction or perforation. 2. Small free fluid in the pelvis 3. Interim placement of IVC filter. Chronic DVT in the left common iliac vein. Electronically Signed   By: Donavan Foil M.D.   On: 12/27/2018 19:35    Assessment/Plan Principal Problem:   Universal ulcerative colitis with rectal bleeding (HCC) Active Problems:   Abdominal pain   DVT (deep venous thrombosis) (HCC)   Hyponatremia     #1 pancolitis: Appears to be due to ulcerative colitis.  Patient has been evaluated by gastroenterology.  Recommendation is for steroids.  No antibiotics started so far.  She appears to be stable.  Will follow GI recommendations.  #2 recent pulmonary embolism: Patient has seen blood in her stool.  There is possibility of sigmoidoscopy.  I will hold Eliquis.  She already has IVC filter in place.  If no bleeding overnight may be started on heparin until discharge and then patient can resume her Eliquis.  I will order SCDs.  #3 hyponatremia: Hydrate patient and monitor sodium levels.  #4 hypokalemia: Patient has severe hypokalemia potassium 2.8.  IV potassium ordered.  Recheck and continue to replete.  Most likely from the diarrhea.  #5 leukocytosis: Secondary to pancolitis.  Follow white count closely.  #6 iron deficiency anemia: Most likely from GI loss.  H&H close to her baseline.  Follow closely monitor.   DVT prophylaxis: SCD Code Status: Full code Family Communication:  Discussed care with patient Disposition Plan: Home Consults called: Dr. Lucio Edward, gastroenterology Admission status: Inpatient  Severity of Illness: The appropriate patient status for this patient is INPATIENT. Inpatient status is judged to be reasonable and necessary in order to provide the required intensity of service to ensure the patient's safety. The patient's presenting symptoms, physical exam findings, and initial radiographic and laboratory data in the context of their chronic comorbidities is felt to place them at high risk for further clinical deterioration. Furthermore, it is not anticipated that the patient will be medically stable for discharge from the hospital within 2 midnights of admission. The following factors support the patient status of inpatient.   " The patient's presenting symptoms include fever abdominal pain and diarrhea. " The worrisome physical exam findings include diffuse tenderness of the abdomen. " The initial radiographic and laboratory data are worrisome because of potassium 2.8. " The chronic co-morbidities include history of ulcerative colitis.   * I certify that at the point of admission it is my clinical judgment that the patient will require inpatient hospital care spanning beyond 2 midnights from the point of admission due to high intensity of service, high risk for further deterioration and high frequency of surveillance required.Barbette Merino MD [facility] Pager [phone]  If 7PM-7AM, please contact night-coverage [email or web address] Password [facility]  12/27/2018, 8:36 PM

## 2018-12-28 ENCOUNTER — Encounter (HOSPITAL_COMMUNITY): Payer: Self-pay | Admitting: General Practice

## 2018-12-28 ENCOUNTER — Inpatient Hospital Stay (HOSPITAL_COMMUNITY): Payer: BC Managed Care – PPO

## 2018-12-28 DIAGNOSIS — D500 Iron deficiency anemia secondary to blood loss (chronic): Secondary | ICD-10-CM

## 2018-12-28 DIAGNOSIS — E43 Unspecified severe protein-calorie malnutrition: Secondary | ICD-10-CM

## 2018-12-28 DIAGNOSIS — R110 Nausea: Secondary | ICD-10-CM

## 2018-12-28 DIAGNOSIS — K529 Noninfective gastroenteritis and colitis, unspecified: Secondary | ICD-10-CM

## 2018-12-28 DIAGNOSIS — K921 Melena: Secondary | ICD-10-CM

## 2018-12-28 LAB — COMPREHENSIVE METABOLIC PANEL
ALT: 12 U/L (ref 0–44)
AST: 10 U/L — ABNORMAL LOW (ref 15–41)
Albumin: 2.2 g/dL — ABNORMAL LOW (ref 3.5–5.0)
Alkaline Phosphatase: 44 U/L (ref 38–126)
Anion gap: 6 (ref 5–15)
BUN: 7 mg/dL (ref 6–20)
CO2: 25 mmol/L (ref 22–32)
Calcium: 8.2 mg/dL — ABNORMAL LOW (ref 8.9–10.3)
Chloride: 107 mmol/L (ref 98–111)
Creatinine, Ser: 0.73 mg/dL (ref 0.44–1.00)
GFR calc Af Amer: >60 mL/min (ref >60)
GFR calc non Af Amer: >60 mL/min (ref >60)
Glucose, Bld: 169 mg/dL — ABNORMAL HIGH (ref 70–99)
Potassium: 4.5 mmol/L (ref 3.5–5.1)
Sodium: 138 mmol/L (ref 135–145)
Total Bilirubin: 0.5 mg/dL (ref 0.3–1.2)
Total Protein: 5.1 g/dL — ABNORMAL LOW (ref 6.5–8.1)

## 2018-12-28 LAB — CBC
HCT: 29.8 % — ABNORMAL LOW (ref 36.0–46.0)
Hemoglobin: 8.7 g/dL — ABNORMAL LOW (ref 12.0–15.0)
MCH: 23.4 pg — ABNORMAL LOW (ref 26.0–34.0)
MCHC: 29.2 g/dL — ABNORMAL LOW (ref 30.0–36.0)
MCV: 80.1 fL (ref 80.0–100.0)
Platelets: 277 10*3/uL (ref 150–400)
RBC: 3.72 MIL/uL — ABNORMAL LOW (ref 3.87–5.11)
RDW: 15 % (ref 11.5–15.5)
WBC: 5.3 10*3/uL (ref 4.0–10.5)
nRBC: 0 % (ref 0.0–0.2)

## 2018-12-28 LAB — GASTROINTESTINAL PANEL BY PCR, STOOL (REPLACES STOOL CULTURE)

## 2018-12-28 LAB — IRON AND TIBC
Iron: 6 ug/dL — ABNORMAL LOW (ref 28–170)
Saturation Ratios: 3 % — ABNORMAL LOW (ref 10.4–31.8)
TIBC: 200 ug/dL — ABNORMAL LOW (ref 250–450)
UIBC: 194 ug/dL

## 2018-12-28 MED ORDER — MORPHINE SULFATE (PF) 2 MG/ML IV SOLN
2.0000 mg | INTRAVENOUS | Status: DC | PRN
  Administered 2018-12-28 – 2018-12-31 (×6): 2 mg via INTRAVENOUS
  Filled 2018-12-28 (×6): qty 1

## 2018-12-28 MED ORDER — FERROUS SULFATE 325 (65 FE) MG PO TABS: 325.0000 mg | ORAL_TABLET | Freq: Two times a day (BID) | ORAL | Status: DC

## 2018-12-28 MED ORDER — KCL IN DEXTROSE-NACL 10-5-0.45 MEQ/L-%-% IV SOLN
INTRAVENOUS | Status: DC
  Administered 2018-12-28 – 2019-01-02 (×11): via INTRAVENOUS
  Filled 2018-12-28 (×12): qty 1000

## 2018-12-28 MED ORDER — ACETAMINOPHEN 325 MG PO TABS
650.0000 mg | ORAL_TABLET | Freq: Four times a day (QID) | ORAL | Status: DC | PRN
  Administered 2018-12-28 – 2018-12-29 (×2): 650 mg via ORAL
  Filled 2018-12-28 (×2): qty 2

## 2018-12-28 MED ORDER — SODIUM CHLORIDE 0.9 % IV SOLN
8.0000 mg | Freq: Three times a day (TID) | INTRAVENOUS | Status: DC | PRN
  Filled 2018-12-28: qty 4

## 2018-12-28 MED ORDER — SODIUM CHLORIDE 0.9 % IV SOLN
10.0000 mg/kg | Freq: Once | INTRAVENOUS | Status: AC
  Administered 2018-12-29: 600 mg via INTRAVENOUS
  Filled 2018-12-28 (×2): qty 60

## 2018-12-28 MED ORDER — OXYCODONE HCL 5 MG PO TABS
5.0000 mg | ORAL_TABLET | ORAL | Status: DC | PRN
  Administered 2018-12-30 – 2019-01-02 (×3): 5 mg via ORAL
  Filled 2018-12-28 (×4): qty 1

## 2018-12-28 MED ORDER — ONDANSETRON HCL 4 MG PO TABS: 8.0000 mg | ORAL_TABLET | Freq: Three times a day (TID) | ORAL | Status: DC | PRN

## 2018-12-28 MED ORDER — ACETAMINOPHEN 500 MG PO TABS: 1000.0000 mg | ORAL_TABLET | Freq: Four times a day (QID) | ORAL | Status: DC | PRN

## 2018-12-28 MED ORDER — SODIUM CHLORIDE 0.9 % IV SOLN
10.0000 mg/kg | Freq: Once | INTRAVENOUS | Status: DC
  Filled 2018-12-28: qty 60

## 2018-12-28 MED ORDER — PIPERACILLIN-TAZOBACTAM 3.375 G IVPB
3.3750 g | Freq: Three times a day (TID) | INTRAVENOUS | Status: DC
  Administered 2018-12-28 – 2019-01-01 (×12): 3.375 g via INTRAVENOUS
  Filled 2018-12-28 (×10): qty 50

## 2018-12-28 NOTE — Progress Notes (Addendum)
PROGRESS NOTE  Monica Ortega BWL:[id number] DOB: 1990-11-30 DOA: 12/27/2018 PCP: [name], DO  HPI/Recap of past 24 hours: Monica Ortega is a 28 y.o. female with medical history significant of ulcerative colitis and recent DVT with IVC filter in place and on Eliquis, history of depression with anxiety who presents to the ER with worsening fever abdominal pain and diarrhea.  Evaluation so far showed pancolitis.  GI has been consulted and has evaluated the patient.  Admitted with flareup of ulcerative colitis.    Started on IV steroids by GI.  12/28/18: Patient seen and examined at her bedside.  Reports persistent watery stool with some blood in it.  C. difficile PCR negative.  Afebrile overnight with no leukocytosis.  Nausea and abdominal pain have improved after starting IV steroids.  He has no cardiopulmonary symptoms.  UPDATE: Worsening abdominal pain reported by bedside RN this afternoon, fever 100.8 and tachycardia.  Obtain blood cultures x2 peripherally and started on IV Zosyn empirically.  Updated GI.  Portable KUB ordered.   Assessment/Plan: Principal Problem:   Universal ulcerative colitis with rectal bleeding (HCC) Active Problems:   Abdominal pain   DVT (deep venous thrombosis) (HCC)   Hyponatremia  Acute on chronic ulcerative pancolitis/ulcerative colitis flare Recently diagnosed ulcerative pancolitis post Remicade x2 with worsening bloody diarrhea, abdominal pain and fever Elevated inflammatory markers, CRP 12.5 C. difficile ruled out Started on Imodium with improvement of diarrhea GI following This afternoon developed significant pain with fever 100.8, tachycardia Obtain KUB Obtain blood cultures x2 peripherally Start Zosyn Obtain CBC in the morning Monitor fever curve and WBC IV morphine as needed for severe pain Oxycodone as needed for moderate pain Tylenol as needed for mild pain  Acute lower GI bleed/iron deficiency anemia Iron studies revealed significant  iron deficiency Started on ferrous sulfate 325 mg twice daily Dropping hemoglobin this morning from 10.7 yesterday to 8.7 today Continues to have watery stools with blood in it Type and screen Transfuse with hemoglobin less than 8 with active GI bleed or with symptomatic anemia  History of recent pulmonary embolism on Eliquis post IVC filter placement Eliquis is on hold due to GI bleed O2 saturation greater than 92% on room air  Euvolemic hyponatremia, resolved  Leukocytosis, resolved, likely reactive from UC flare  Hypokalemia resolved post repletion  Risks: High risk for decompensation due to acute GI bleed in the setting of ulcerative colitis flare, recent pulmonary embolism with oral anticoagulation being held due to GI bleed.  Patient will require least 2 midnights for further evaluation and treatment of present condition.  DVT prophylaxis: SCD Code Status: Full code Family Communication: Discussed care with patient Disposition Plan: Home Consults called: Dr. Lucio Edward, gastroenterology    Objective: Vitals:   12/27/18 2000 12/27/18 2100 12/27/18 2215 12/28/18 0454  BP: 105/70 113/78 114/75 119/72  Pulse: 88 85 88 92  Resp: 12 12 16 16   Temp:   98.5 F (36.9 C) 98.6 F (37 C)  TempSrc:   Oral Oral  SpO2: 96% 99% 100% 98%  Weight:   64.9 kg   Height:   5' 9"  (1.753 m)     Intake/Output Summary (Last 24 hours) at 12/28/2018 0930 Last data filed at 12/27/2018 1719 Gross per 24 hour  Intake 2100 ml  Output -  Net 2100 ml   Filed Weights   12/27/18 1233 12/27/18 2215  Weight: 63.5 kg 64.9 kg    Exam:  . General: 28 y.o. year-old female well developed  well nourished in no acute distress.  Alert and oriented x3. . Cardiovascular: Regular rate and rhythm with no rubs or gallops.  No thyromegaly or JVD noted.   Marland Kitchen Respiratory: Clear to auscultation with no wheezes or rales. Good inspiratory effort. . Abdomen: Soft nontender nondistended with normal bowel sounds  x4 quadrants. . Musculoskeletal: No lower extremity edema. 2/4 pulses in all 4 extremities. . Skin: No ulcerative lesions noted or rashes, . Psychiatry: Mood is appropriate for condition and setting   Data Reviewed: CBC: Recent Labs  Lab 12/27/18 1246 12/28/18 0312  WBC 12.9* 5.3  NEUTROABS 9.3*  --   HGB 10.7* 8.7*  HCT 35.6* 29.8*  MCV 79.1* 80.1  PLT 354 268   Basic Metabolic Panel: Recent Labs  Lab 12/27/18 1246 12/28/18 0312  NA 132* 138  K 2.8* 4.5  CL 96* 107  CO2 23 25  GLUCOSE 157* 169*  BUN 11 7  CREATININE 1.22* 0.73  CALCIUM 8.7* 8.2*   GFR: Estimated Creatinine Clearance: 105.3 mL/min (by C-G formula based on SCr of 0.73 mg/dL). Liver Function Tests: Recent Labs  Lab 12/27/18 1246 12/28/18 0312  AST 14* 10*  ALT 14 12  ALKPHOS 50 44  BILITOT 0.9 0.5  PROT 5.6* 5.1*  ALBUMIN 2.6* 2.2*   Recent Labs  Lab 12/27/18 1246  LIPASE 30   No results for input(s): AMMONIA in the last 168 hours. Coagulation Profile: No results for input(s): INR, PROTIME in the last 168 hours. Cardiac Enzymes: No results for input(s): CKTOTAL, CKMB, CKMBINDEX, TROPONINI in the last 168 hours. BNP (last 3 results) No results for input(s): PROBNP in the last 8760 hours. HbA1C: No results for input(s): HGBA1C in the last 72 hours. CBG: No results for input(s): GLUCAP in the last 168 hours. Lipid Profile: No results for input(s): CHOL, HDL, LDLCALC, TRIG, CHOLHDL, LDLDIRECT in the last 72 hours. Thyroid Function Tests: No results for input(s): TSH, T4TOTAL, FREET4, T3FREE, THYROIDAB in the last 72 hours. Anemia Panel: Recent Labs    12/28/18 0812  TIBC 200*  IRON 6*   Urine analysis:    Component Value Date/Time   COLORURINE YELLOW 12/27/2018 1445   APPEARANCEUR CLEAR 12/27/2018 1445   LABSPEC 1.004 (L) 12/27/2018 1445   PHURINE 5.0 12/27/2018 1445   GLUCOSEU NEGATIVE 12/27/2018 1445   HGBUR SMALL (A) 12/27/2018 1445   BILIRUBINUR NEGATIVE 12/27/2018 1445    KETONESUR NEGATIVE 12/27/2018 1445   PROTEINUR NEGATIVE 12/27/2018 1445   NITRITE NEGATIVE 12/27/2018 1445   LEUKOCYTESUR NEGATIVE 12/27/2018 1445   Sepsis Labs: @LABRCNTIP (procalcitonin:4,lacticidven:4)  ) Recent Results (from the past 240 hour(s))  C Difficile Quick Screen w PCR reflex     Status: None   Collection Time: 12/27/18  5:27 PM   Specimen: Stool  Result Value Ref Range Status   C Diff antigen NEGATIVE NEGATIVE Final   C Diff toxin NEGATIVE NEGATIVE Final   C Diff interpretation No C. difficile detected.  Final    Comment: Performed at [facility], [place] [address], [place], [place] [address]      Studies: Ct Abdomen Pelvis W Contrast  Result Date: 12/27/2018 CLINICAL DATA:  Abdomen pain EXAM: CT ABDOMEN AND PELVIS WITH CONTRAST TECHNIQUE: Multidetector CT imaging of the abdomen and pelvis was performed using the standard protocol following bolus administration of intravenous contrast. CONTRAST:  161m OMNIPAQUE IOHEXOL 300 MG/ML  SOLN COMPARISON:  CT 10/31/2018, 09/29/2018 FINDINGS: Lower chest: No acute abnormality. Hepatobiliary: No focal liver abnormality is seen.  No gallstones, gallbladder wall thickening, or biliary dilatation. Pancreas: Unremarkable. No pancreatic ductal dilatation or surrounding inflammatory changes. Spleen: Normal in size without focal abnormality. Adrenals/Urinary Tract: Adrenal glands are unremarkable. Kidneys are normal, without renal calculi, focal lesion, or hydronephrosis. Bladder is unremarkable. Stomach/Bowel: Stomach is within normal limits. No dilated small bowel. Featureless colon with wall thickening and mild surrounding inflammatory change consistent with pancolitis. Vascular/Lymphatic: Nonaneurysmal aorta. Interval placement of IVC filter at L2-L3 level. Chronic DVT in the left common iliac vein. Reproductive: Uterus and bilateral adnexa are unremarkable. Other: Small free fluid in the pelvis.  No free air Musculoskeletal: No acute  or significant osseous findings. IMPRESSION: 1. Pancolitis, consistent with history of ulcerative colitis. Negative for obstruction or perforation. 2. Small free fluid in the pelvis 3. Interim placement of IVC filter. Chronic DVT in the left common iliac vein. Electronically Signed   By: Donavan Foil M.D.   On: 12/27/2018 19:35    Scheduled Meds: . feeding supplement (ENSURE ENLIVE)  237 mL Oral BID BM  . mesalamine  4.8 g Oral Q breakfast  . methylPREDNISolone (SOLU-MEDROL) injection  60 mg Intravenous Q12H  . pantoprazole  40 mg Oral Daily    Continuous Infusions: . 0.9 % NaCl with KCl 40 mEq / L 125 mL/hr (12/27/18 1716)  . dextrose 5 % and 0.9% NaCl 125 mL/hr at 12/28/18 0142     LOS: 1 day     Kayleen Memos, MD [facility] Pager [phone]  If 7PM-7AM, please contact night-coverage [email or web address] Password [email or web address] 12/28/2018, 9:30 AM

## 2018-12-28 NOTE — Progress Notes (Addendum)
Daily Rounding Note  12/28/2018, 9:53 AM  LOS: 1 day   SUBJECTIVE:   Chief complaint: severe UC    Bloody stool continues.  Some nausea when she drank Ensure this AM.  No vomiting.  Abdominal pain persists but improved.   Overall feels better.  Able to get to bathroom unassisted hgb is 8.7, Dr [place] Crane ordered type and screen.    OBJECTIVE:         Vital signs in last 24 hours:    Temp:  [98.4 F (36.9 C)-98.6 F (37 C)] 98.6 F (37 C) (06/19 0454) Pulse Rate:  [85-136] 92 (06/19 0454) Resp:  [12-29] 16 (06/19 0454) BP: (101-124)/(57-82) 119/72 (06/19 0454) SpO2:  [95 %-100 %] 98 % (06/19 0454) Weight:  [63.5 kg-64.9 kg] 64.9 kg (06/18 2215) Last BM Date: 12/28/18 Filed Weights   12/27/18 1233 12/27/18 2215  Weight: 63.5 kg 64.9 kg   General: looks better   Heart: Chest: clear.  No labored breathing Abdomen: soft, ND.  Diffuse tenderness better than yesterday afternoon.  Active BS  Extremities: no CCE Neuro/Psych:  Alert.  Oriented x 3.  No weakness or tremor.    Intake/Output from previous day: 06/18 0701 - 06/19 0700 In: 2100 [IV Piggyback:2100] Out: -   Intake/Output this shift: No intake/output data recorded.  Lab Results: Recent Labs    12/27/18 1246 12/28/18 0312  WBC 12.9* 5.3  HGB 10.7* 8.7*  HCT 35.6* 29.8*  PLT 354 277   BMET Recent Labs    12/27/18 1246 12/28/18 0312  NA 132* 138  K 2.8* 4.5  CL 96* 107  CO2 23 25  GLUCOSE 157* 169*  BUN 11 7  CREATININE 1.22* 0.73  CALCIUM 8.7* 8.2*   LFT Recent Labs    12/27/18 1246 12/28/18 0312  PROT 5.6* 5.1*  ALBUMIN 2.6* 2.2*  AST 14* 10*  ALT 14 12  ALKPHOS 50 44  BILITOT 0.9 0.5   PT/INR No results for input(s): LABPROT, INR in the last 72 hours. Hepatitis Panel No results for input(s): HEPBSAG, HCVAB, HEPAIGM, HEPBIGM in the last 72 hours.  Studies/Results: Ct Abdomen Pelvis W Contrast  Result Date: 12/27/2018 CLINICAL  DATA:  Abdomen pain EXAM: CT ABDOMEN AND PELVIS WITH CONTRAST TECHNIQUE: Multidetector CT imaging of the abdomen and pelvis was performed using the standard protocol following bolus administration of intravenous contrast. CONTRAST:  183m OMNIPAQUE IOHEXOL 300 MG/ML  SOLN COMPARISON:  CT 10/31/2018, 09/29/2018 FINDINGS: Lower chest: No acute abnormality. Hepatobiliary: No focal liver abnormality is seen. No gallstones, gallbladder wall thickening, or biliary dilatation. Pancreas: Unremarkable. No pancreatic ductal dilatation or surrounding inflammatory changes. Spleen: Normal in size without focal abnormality. Adrenals/Urinary Tract: Adrenal glands are unremarkable. Kidneys are normal, without renal calculi, focal lesion, or hydronephrosis. Bladder is unremarkable. Stomach/Bowel: Stomach is within normal limits. No dilated small bowel. Featureless colon with wall thickening and mild surrounding inflammatory change consistent with pancolitis. Vascular/Lymphatic: Nonaneurysmal aorta. Interval placement of IVC filter at L2-L3 level. Chronic DVT in the left common iliac vein. Reproductive: Uterus and bilateral adnexa are unremarkable. Other: Small free fluid in the pelvis.  No free air Musculoskeletal: No acute or significant osseous findings. IMPRESSION: 1. Pancolitis, consistent with history of ulcerative colitis. Negative for obstruction or perforation. 2. Small free fluid in the pelvis 3. Interim placement of IVC filter. Chronic DVT in the left common iliac vein. Electronically Signed   By: KDonavan FoilM.D.   On:  12/27/2018 19:35      (I personally reviewed the CT images)  Scheduled Meds: . feeding supplement (ENSURE ENLIVE)  237 mL Oral BID BM  . ferrous sulfate  325 mg Oral BID WC  . mesalamine  4.8 g Oral Q breakfast  . methylPREDNISolone (SOLU-MEDROL) injection  60 mg Intravenous Q12H  . pantoprazole  40 mg Oral Daily   Continuous Infusions: . 0.9 % NaCl with KCl 40 mEq / L 125 mL/hr (12/27/18  1716)  . dextrose 5 % and 0.9% NaCl 125 mL/hr at 12/28/18 0142   PRN Meds:.acetaminophen, loperamide, ondansetron **OR** ondansetron (ZOFRAN) IV   ASSESMENT:   *  Severe UC.  Persists with acute worsening with bloody stools, fever, increased abd pain in last few days, despite prednisone, Remicade (started 4/24, 3 infusions thus far), Lialda.  Day 2 IV solumedrol.  Slightly improved today.  WBCs now normal.  No fevers.   C diff negative.  Stool path PCR panel in process.     *   Bil DVT.  S/p 10/31/18 IVC filter On Eliquis.  CT shows chronic L common iliac DVT.     *   AKI.  Dehydration.  Resolved.    *   Hypokalemia, resolved with runs potassium and potassium in IVF.      *   IDA.  Received Feraheme x 2 in 10/2018.  Iron and iron sats low.  BID oral iron added yesterday.     PLAN   *   Ferritin level in AM. I rescheduled type and screen for tomorrow AM, should be safe as her Hgb is nowhere near needing transfusion.  Trying to avoid multiple blood draws.  BMET and CBC in AM  *   Awaiting stool path panel.    *   ? Flex sig with biopsies to r/o CMV (CMV negative on colonoscopy 4/24).    *   Switch to D 5 1/2 NS with 10 KCL at 100/hour.            Azucena Freed  12/28/2018, 9:53 AM Phone [phone]  I have discussed the case with the PA, and I personally interviewed and examined the patient. Extensive chart review performed as well.  Patient is known to me from her initial inpatient colonoscopy in March.  Deneise Lever has had a rocky course since the initial diagnosis of her colitis several months ago.  She has essentially been prednisone dependent, and thus far has not had significant clinical improvement at standard 5 mg/kg Remicade induction dosing. I concur with Dr. Blanch Media suggestion that the efficacy of this dose might be limited due to the patient's hypoalbuminemia.  She has a severe colitis, but fortunately it is not fulminant at this point.  She is chronically fatigued and  looks unwell, but is nontoxic.  Her ongoing diarrhea has caused renal azotemia and electrolyte derangement, which are now improved after fluid resuscitation.  She has persistent anemia from chronic disease and GI blood loss as well as severe protein calorie malnutrition.  This patient is in danger of decompensation and progression to fulminant colitis unless some control can be achieved.  IV steroids have so far caused temporary modest improvement.  I feel that aggressive Remicade dosing is now warranted, and favor giving her a dose of 10 mg/kg during this admission.  I will discuss this further with Dr. Fuller Plan, who will be following this patient at least through the weekend.  At this juncture, I do not think a sigmoidoscopy  is necessary, because it does not seem it will change our diagnosis or treatment plan.  She has been ruled out for CMV and C. Difficile.  Mesalamine was discontinued because it is very unlikely to add additional benefit beyond high-dose IV steroids.  I also increased her Zofran dose from 4 mg to 8 mg every 8 hours as needed.  Total time 35 minutes, over half spent in lengthy discussion with patient regarding current scenario and proposed plan.Marland Kitchen  Nelida Meuse III Office: [phone]

## 2018-12-28 NOTE — Plan of Care (Signed)
  Problem: Education: Goal: Knowledge of General Education information will improve Description Including pain rating scale, medication(s)/side effects and non-pharmacologic comfort measures Outcome: Progressing   

## 2018-12-28 NOTE — Progress Notes (Addendum)
Pharmacy Antibiotic/Remicade Note  Monica Ortega is a 28 y.o. female admitted on 12/27/2018 with severe ulcerative colitis with bloody stools.  Pharmacy has been consulted for Zosyn dosing for intra-abdominal infection. WBC down to 5.3, Tmax 100.8 F.  Pharmacy is also asked to arrange for Remicade 10 mg/kg, due to persistent severe UC with bloody stools. Pt has had 2 Quantiferon-TB GOLD tests, with no evidence of latent TB infection detected.  Medical history also includes bilateral DVT, chronic L common iliac DVT (S/P IVC filter, on apixaban at home), AKI (resolved), iron deficiency anemia.  Plan: Zosyn 3.375 gm IV Q 8 hrs Monitor renal function (hx AKI, resolved) Remicade 10 mg/kg (600 mg, rounded to nearest 100 mg in adults, per protocol) X 1 (gradually increase infusion rate, per admin instructions in order)  Height: 5' 9"  (175.3 cm) Weight: 143 lb 1.3 oz (64.9 kg) IBW/kg (Calculated) : 66.2  Temp (24hrs), Avg:99.4 F (37.4 C), Min:98.5 F (36.9 C), Max:100.8 F (38.2 C)  Recent Labs  Lab 12/27/18 1246 12/28/18 0312  WBC 12.9* 5.3  CREATININE 1.22* 0.73    Estimated Creatinine Clearance: 105.3 mL/min (by C-G formula based on SCr of 0.73 mg/dL).    No Known Allergies  Microbiology results: 6/18 COVID negative 10/02/18 Quantiferon-TB Gold: indeterminate (per note with lab result, may be due to anergy) 02/19/18 Quantiferon-TB Gold: negative  Thank you for allowing pharmacy to be a part of this patient's care.  Gillermina Hu, PharmD, BCPS, [facility] Clinical Pharmacist 12/28/2018 3:45 PM

## 2018-12-29 DIAGNOSIS — R1084 Generalized abdominal pain: Secondary | ICD-10-CM

## 2018-12-29 LAB — BASIC METABOLIC PANEL
Anion gap: 10 (ref 5–15)
BUN: <5 mg/dL — ABNORMAL LOW (ref 6–20)
CO2: 22 mmol/L (ref 22–32)
Calcium: 8.2 mg/dL — ABNORMAL LOW (ref 8.9–10.3)
Chloride: 105 mmol/L (ref 98–111)
Creatinine, Ser: 0.87 mg/dL (ref 0.44–1.00)
GFR calc Af Amer: >60 mL/min (ref >60)
GFR calc non Af Amer: >60 mL/min (ref >60)
Glucose, Bld: 182 mg/dL — ABNORMAL HIGH (ref 70–99)
Potassium: 3.6 mmol/L (ref 3.5–5.1)
Sodium: 137 mmol/L (ref 135–145)

## 2018-12-29 LAB — CBC
HCT: 28.6 % — ABNORMAL LOW (ref 36.0–46.0)
Hemoglobin: 8.5 g/dL — ABNORMAL LOW (ref 12.0–15.0)
MCH: 23.5 pg — ABNORMAL LOW (ref 26.0–34.0)
MCHC: 29.7 g/dL — ABNORMAL LOW (ref 30.0–36.0)
MCV: 79 fL — ABNORMAL LOW (ref 80.0–100.0)
Platelets: 281 10*3/uL (ref 150–400)
RBC: 3.62 MIL/uL — ABNORMAL LOW (ref 3.87–5.11)
RDW: 14.5 % (ref 11.5–15.5)
WBC: 6.4 10*3/uL (ref 4.0–10.5)
nRBC: 0 % (ref 0.0–0.2)

## 2018-12-29 LAB — HEPARIN LEVEL (UNFRACTIONATED)
Heparin Unfractionated: <0.1 IU/mL — ABNORMAL LOW (ref 0.30–0.70)
Heparin Unfractionated: 0.31 IU/mL (ref 0.30–0.70)

## 2018-12-29 LAB — NOVEL CORONAVIRUS, NAA (HOSP ORDER, SEND-OUT TO REF LAB; TAT 18-24 HRS): SARS-CoV-2, NAA: NOT DETECTED

## 2018-12-29 LAB — TYPE AND SCREEN
ABO/RH(D): AB POS
Antibody Screen: NEGATIVE

## 2018-12-29 LAB — PROTIME-INR
INR: 1.3 — ABNORMAL HIGH (ref 0.8–1.2)
Prothrombin Time: 15.7 seconds — ABNORMAL HIGH (ref 11.4–15.2)

## 2018-12-29 LAB — APTT: aPTT: 30 seconds (ref 24–36)

## 2018-12-29 MED ORDER — DIPHENHYDRAMINE HCL 25 MG PO CAPS
50.0000 mg | ORAL_CAPSULE | Freq: Once | ORAL | Status: AC
  Administered 2018-12-29: 50 mg via ORAL
  Filled 2018-12-29: qty 2

## 2018-12-29 MED ORDER — ALUM & MAG HYDROXIDE-SIMETH 200-200-20 MG/5ML PO SUSP
30.0000 mL | ORAL | Status: DC | PRN
  Filled 2018-12-29: qty 30

## 2018-12-29 MED ORDER — HEPARIN (PORCINE) 25000 UT/250ML-% IV SOLN
1100.0000 [IU]/h | INTRAVENOUS | Status: DC
  Administered 2018-12-29 – 2019-01-01 (×4): 1100 [IU]/h via INTRAVENOUS
  Filled 2018-12-29 (×5): qty 250

## 2018-12-29 MED ORDER — SODIUM CHLORIDE 0.9 % IV SOLN
125.0000 mg | Freq: Every day | INTRAVENOUS | Status: AC
  Administered 2018-12-29 – 2018-12-30 (×2): 125 mg via INTRAVENOUS
  Filled 2018-12-29 (×2): qty 10

## 2018-12-29 NOTE — Progress Notes (Signed)
Progress Note   Subjective  Persistent abdominal pain, diarrhea, intermittent fevers. Less rectal bleeding.   Objective  Vital signs in last 24 hours: Temp:  [99.1 F (37.3 C)-101.9 F (38.8 C)] 99.1 F (37.3 C) (06/20 0635) Pulse Rate:  [99-111] 99 (06/20 0635) Resp:  [18] 18 (06/20 0458) BP: (105-131)/(65-92) 131/92 (06/20 0458) SpO2:  [97 %-100 %] 98 % (06/20 0458) Last BM Date: 12/29/18  General: Alert, well-developed, in NAD Heart:  Regular rate and rhythm; no murmurs Chest: Clear to ascultation bilaterally Abdomen:  Soft, mild generalized tenderness lower > upper, and nondistended. Normal bowel sounds, without guarding, and without rebound.   Extremities:  Without edema. Neurologic:  Alert and  oriented x4; grossly normal neurologically. Psych:  Alert and cooperative. Normal mood and affect.  Intake/Output from previous day: 06/19 0701 - 06/20 0700 In: 1572.1 [I.V.:1572.1] Out: -  Intake/Output this shift: No intake/output data recorded.  Lab Results: Recent Labs    12/27/18 1246 12/28/18 0312 12/29/18 0247  WBC 12.9* 5.3 6.4  HGB 10.7* 8.7* 8.5*  HCT 35.6* 29.8* 28.6*  PLT 354 277 281   BMET Recent Labs    12/27/18 1246 12/28/18 0312 12/29/18 0247  NA 132* 138 137  K 2.8* 4.5 3.6  CL 96* 107 105  CO2 23 25 22   GLUCOSE 157* 169* 182*  BUN 11 7 <5*  CREATININE 1.22* 0.73 0.87  CALCIUM 8.7* 8.2* 8.2*   LFT Recent Labs    12/28/18 0312  PROT 5.1*  ALBUMIN 2.2*  AST 10*  ALT 12  ALKPHOS 44  BILITOT 0.5     Studies/Results: Ct Abdomen Pelvis W Contrast  Result Date: 12/27/2018 CLINICAL DATA:  Abdomen pain EXAM: CT ABDOMEN AND PELVIS WITH CONTRAST TECHNIQUE: Multidetector CT imaging of the abdomen and pelvis was performed using the standard protocol following bolus administration of intravenous contrast. CONTRAST:  159m OMNIPAQUE IOHEXOL 300 MG/ML  SOLN COMPARISON:  CT 10/31/2018, 09/29/2018 FINDINGS: Lower chest: No acute abnormality.  Hepatobiliary: No focal liver abnormality is seen. No gallstones, gallbladder wall thickening, or biliary dilatation. Pancreas: Unremarkable. No pancreatic ductal dilatation or surrounding inflammatory changes. Spleen: Normal in size without focal abnormality. Adrenals/Urinary Tract: Adrenal glands are unremarkable. Kidneys are normal, without renal calculi, focal lesion, or hydronephrosis. Bladder is unremarkable. Stomach/Bowel: Stomach is within normal limits. No dilated small bowel. Featureless colon with wall thickening and mild surrounding inflammatory change consistent with pancolitis. Vascular/Lymphatic: Nonaneurysmal aorta. Interval placement of IVC filter at L2-L3 level. Chronic DVT in the left common iliac vein. Reproductive: Uterus and bilateral adnexa are unremarkable. Other: Small free fluid in the pelvis.  No free air Musculoskeletal: No acute or significant osseous findings. IMPRESSION: 1. Pancolitis, consistent with history of ulcerative colitis. Negative for obstruction or perforation. 2. Small free fluid in the pelvis 3. Interim placement of IVC filter. Chronic DVT in the left common iliac vein. Electronically Signed   By: KDonavan FoilM.D.   On: 12/27/2018 19:35   Dg Abd Portable 1v  Result Date: 12/28/2018 CLINICAL DATA:  Abdominal pain EXAM: PORTABLE ABDOMEN - 1 VIEW COMPARISON:  CT 12/27/2018 FINDINGS: IVC filter to the right of L2-L3. Mild air distension and featureless appearance of the colon, consistent with colitis. Nonobstructed gas pattern. No obvious intramural air. IMPRESSION: Nonobstructed gas pattern. Featureless appearance of the colon, compatible with history of colitis. No intramural air. Electronically Signed   By: KDonavan FoilM.D.   On: 12/28/2018 16:29      Assessment &  Recommendations   1. Severe ulcerative colitis, slightly improved since admission. Intermittent fevers, persistent abdominal pain and diarrhea. Rectal bleeding has decreased. GI pathogen panel  negative. C diff negative. Fevers likely due to UC. Hydration and electrolytes improved. Continue IV Zosyn. Continue IV Solu-Medrol. Remicade 10 mg/kg today. Premedicate with Tylenol, Benadryl. Patient in full agreement with plans.   2. Anemia, likely iron deficiency. Stopped PO iron to avoid potential iron side effects impacting GI symptoms. Replace with IV iron tomorrow or Monday.     LOS: 2 days   Norberto Sorenson T. Fuller Plan MD 12/29/2018, 9:07 AM

## 2018-12-29 NOTE — Progress Notes (Signed)
ANTICOAGULATION CONSULT NOTE -  Pharmacy Consult for IV heparin Indication: history of DVT  No Known Allergies  Patient Measurements: Height: 5' 9"  (175.3 cm) Weight: 143 lb 1.3 oz (64.9 kg) IBW/kg (Calculated) : 66.2 Heparin Dosing Weight: actual body weight   Vital Signs: Temp: 97.8 F (36.6 C) (06/20 1427) Temp Source: Oral (06/20 1427) BP: 109/67 (06/20 1427) Pulse Rate: 86 (06/20 1427)  Labs: Recent Labs    12/27/18 1246 12/28/18 0312 12/29/18 0247 12/29/18 1300 12/29/18 2001  HGB 10.7* 8.7* 8.5*  --   --   HCT 35.6* 29.8* 28.6*  --   --   PLT 354 277 281  --   --   APTT  --   --   --  30  --   LABPROT  --   --   --  15.7*  --   INR  --   --   --  1.3*  --   HEPARINUNFRC  --   --   --  <0.10* 0.31  CREATININE 1.22* 0.73 0.87  --   --     Estimated Creatinine Clearance: 96.9 mL/min (by C-G formula based on SCr of 0.87 mg/dL).   Medical History: Past Medical History:  Diagnosis Date  . Ankle sprain   . Anxiety   . Colitis   . Depression    denies hospitalization or medication  . DVT (deep venous thrombosis) ([place]) 10/31/2018  . DVT (deep venous thrombosis) ([place])   . Hyponatremia   . Universal ulcerative colitis with rectal bleeding ([place]) 10/31/2018    Medications:  PTA Apixaban 62m PO BID-last dose 12/27/2018 at 1000  Assessment: 28 y/oF with PMH of DVT in April 2020 s/p IVC filter on Apixaban PTA admitted for acute severe ulcerative colitis flare. Worsening bloody diarrhea on admission. Per GI notes, less rectal bleeding today. Pharmacy consulted to dose heparin infusion for hx of VTE. Baseline heparin level undetectable so will be using heparin levels for titrations -heparin level= 0.31  Goal of Therapy:  Heparin level 0.3-0.7 units/ml  Monitor platelets by anticoagulation protocol: Yes   Plan:  -No heparin changes needed -Daily heparin level and CBC  AHildred Laser PharmD Clinical Pharmacist **Pharmacist phone directory can now be found on  [email or web address] (PW [email or web address]).  Listed under [facility]

## 2018-12-29 NOTE — Progress Notes (Addendum)
ANTICOAGULATION CONSULT NOTE - Initial Consult  Pharmacy Consult for IV heparin Indication: history of DVT  No Known Allergies  Patient Measurements: Height: 5' 9"  (175.3 cm) Weight: 143 lb 1.3 oz (64.9 kg) IBW/kg (Calculated) : 66.2 Heparin Dosing Weight: actual body weight   Vital Signs: Temp: 98.4 F (36.9 C) (06/20 1232) Temp Source: Oral (06/20 1232) BP: 114/71 (06/20 1232) Pulse Rate: 86 (06/20 1232)  Labs: Recent Labs    12/27/18 1246 12/28/18 0312 12/29/18 0247  HGB 10.7* 8.7* 8.5*  HCT 35.6* 29.8* 28.6*  PLT 354 277 281  CREATININE 1.22* 0.73 0.87    Estimated Creatinine Clearance: 96.9 mL/min (by C-G formula based on SCr of 0.87 mg/dL).   Medical History: Past Medical History:  Diagnosis Date  . Ankle sprain   . Anxiety   . Colitis   . Depression    denies hospitalization or medication  . DVT (deep venous thrombosis) ([place]) 10/31/2018  . DVT (deep venous thrombosis) ([place])   . Hyponatremia   . Universal ulcerative colitis with rectal bleeding ([place]) 10/31/2018    Medications:  PTA Apixaban 33m PO BID-last dose 12/27/2018 at 1000  Assessment: 28 y/oF with PMH of DVT in April 2020 s/p IVC filter on Apixaban PTA admitted for acute severe ulcerative colitis flare. Worsening bloody diarrhea on admission. Per GI notes, less rectal bleeding today. [facility] CBC reveals Hgb 8.5, Pltc WNL. Getting IV iron.   Goal of Therapy:  Heparin level 0.3-0.7 units/ml  aPTT 66-102 seconds  Monitor platelets by anticoagulation protocol: Yes   Plan:   Baseline heparin level, aPTT, PT/INR  Start heparin infusion (no bolus due to recent apixaban and bloody diarrhea) at 1100 units/hr after baseline labs drawn  Note that apixaban can falsely elevate heparin levels, so will likely need to adjust/titrate heparin infusion using aPTT initially. Will evaluate baseline labs to determine. Once heparin levels and aPTT  correlate and thus effects of Apixaban diminished, can use heparin levels for monitoring.   APTT/heparin level 6 hours after initiation of heparin  Daily CBC, heparin level, aPTT  Monitor closely for s/sx of worsening bleeding.     JLindell Spar PharmD, BCPS Clinical Pharmacist [phone] 75896/20/2020 12:47 PM    Addendum: Baseline heparin level undetectable. Will use heparin levels for heparin dose titration.   JLindell Spar PharmD, BCPS Clinical Pharmacist 12/29/2018 2:18 PM

## 2018-12-29 NOTE — Progress Notes (Signed)
PROGRESS NOTE    Monica Ortega  TJQ:[id number] DOB: 1990/10/15 DOA: 12/27/2018 PCP: [name], DO   Brief Narrative:  27 year old with history of ulcerative colitis, recent DVT with IVC filter in place and on Eliquis, depression/anxiety came to the ER with complaints of fevers, abdominal pain and diarrhea.  C. difficile study was negative.  Admitted for ulcerative colitis flare started on IV steroids.  Also due to fever she was started on Zosyn.   Assessment & Plan:   Principal Problem:   Universal ulcerative colitis with rectal bleeding (HCC) Active Problems:   Abdominal pain   DVT (deep venous thrombosis) (HCC)   Hyponatremia  Abdominal pain with diarrhea secondary to acute severe colitis flare - Symptomatic management at this time along with IV Solu-Medrol.  Remicade to be started by GI.  IV Zosyn.  C. difficile studies negative.  GI panel-negative. -Appreciate GI input -KUB nonobstructive pattern  Iron deficiency anemia - Currently on IV iron for 2 days at least then switch over to p.o.  History of DVT and PE -No obvious signs of bleeding at the moment therefore will start patient on heparin drip.  At home she is on Eliquis  DVT prophylaxis: Heparin drip Code Status: Full code Family Communication: None at bedside Disposition Plan: Maintain hospital stay until abdominal symptoms improved  Consultants:   GI  Procedures:   None  Antimicrobials:   Zosyn day 2   Subjective: Still has about 6+ watery bowel movements overnight with generalized abdominal discomfort.  Slightly improved from yesterday.  Review of Systems Otherwise negative except as per HPI, including: General: Denies fever, chills, night sweats or unintended weight loss. Resp: Denies cough, wheezing, shortness of breath. Cardiac: Denies chest pain, palpitations, orthopnea, paroxysmal nocturnal dyspnea. GI: Denies nausea, vomiting,  constipation GU: Denies dysuria, frequency, hesitancy or  incontinence MS: Denies muscle aches, joint pain or swelling Neuro: Denies headache, neurologic deficits (focal weakness, numbness, tingling), abnormal gait Psych: Denies anxiety, depression, SI/HI/AVH Skin: Denies new rashes or lesions ID: Denies sick contacts, exotic exposures, travel  Objective: Vitals:   12/28/18 1530 12/28/18 2049 12/29/18 0458 12/29/18 0635  BP:  105/65 (!) 131/92   Pulse:  99 (!) 111 99  Resp:  18 18   Temp: (!) 100.8 F (38.2 C) 99.2 F (37.3 C) (!) 101.9 F (38.8 C) 99.1 F (37.3 C)  TempSrc:  Oral Oral Oral  SpO2:  97% 98%   Weight:      Height:        Intake/Output Summary (Last 24 hours) at 12/29/2018 0850 Last data filed at 12/29/2018 0300 Gross per 24 hour  Intake 1572.08 ml  Output -  Net 1572.08 ml   Filed Weights   12/27/18 1233 12/27/18 2215  Weight: 63.5 kg 64.9 kg    Examination:  General exam: Appears calm and comfortable  Respiratory system: Clear to auscultation. Respiratory effort normal. Cardiovascular system: S1 & S2 heard, RRR. No JVD, murmurs, rubs, gallops or clicks. No pedal edema. Gastrointestinal system: Abdomen is nondistended, soft and nontender. No organomegaly or masses felt. Normal bowel sounds heard. Central nervous system: Alert and oriented. No focal neurological deficits. Extremities: Symmetric 5 x 5 power. Skin: No rashes, lesions or ulcers Psychiatry: Judgement and insight appear normal. Mood & affect appropriate.     Data Reviewed:   CBC: Recent Labs  Lab 12/27/18 1246 12/28/18 0312 12/29/18 0247  WBC 12.9* 5.3 6.4  NEUTROABS 9.3*  --   --   HGB 10.7* 8.7*  8.5*  HCT 35.6* 29.8* 28.6*  MCV 79.1* 80.1 79.0*  PLT 354 277 196   Basic Metabolic Panel: Recent Labs  Lab 12/27/18 1246 12/28/18 0312 12/29/18 0247  NA 132* 138 137  K 2.8* 4.5 3.6  CL 96* 107 105  CO2 23 25 22   GLUCOSE 157* 169* 182*  BUN 11 7 <5*  CREATININE 1.22* 0.73 0.87  CALCIUM 8.7* 8.2* 8.2*   GFR: Estimated  Creatinine Clearance: 96.9 mL/min (by C-G formula based on SCr of 0.87 mg/dL). Liver Function Tests: Recent Labs  Lab 12/27/18 1246 12/28/18 0312  AST 14* 10*  ALT 14 12  ALKPHOS 50 44  BILITOT 0.9 0.5  PROT 5.6* 5.1*  ALBUMIN 2.6* 2.2*   Recent Labs  Lab 12/27/18 1246  LIPASE 30   No results for input(s): AMMONIA in the last 168 hours. Coagulation Profile: No results for input(s): INR, PROTIME in the last 168 hours. Cardiac Enzymes: No results for input(s): CKTOTAL, CKMB, CKMBINDEX, TROPONINI in the last 168 hours. BNP (last 3 results) No results for input(s): PROBNP in the last 8760 hours. HbA1C: No results for input(s): HGBA1C in the last 72 hours. CBG: No results for input(s): GLUCAP in the last 168 hours. Lipid Profile: No results for input(s): CHOL, HDL, LDLCALC, TRIG, CHOLHDL, LDLDIRECT in the last 72 hours. Thyroid Function Tests: No results for input(s): TSH, T4TOTAL, FREET4, T3FREE, THYROIDAB in the last 72 hours. Anemia Panel: Recent Labs    12/28/18 0812  TIBC 200*  IRON 6*   Sepsis Labs: No results for input(s): PROCALCITON, LATICACIDVEN in the last 168 hours.  Recent Results (from the past 240 hour(s))  Gastrointestinal Panel by PCR , Stool     Status: None   Collection Time: 12/27/18  5:27 PM   Specimen: Stool  Result Value Ref Range Status   Campylobacter species NOT DETECTED NOT DETECTED Final   Plesimonas shigelloides NOT DETECTED NOT DETECTED Final   Salmonella species NOT DETECTED NOT DETECTED Final   Yersinia enterocolitica NOT DETECTED NOT DETECTED Final   Vibrio species NOT DETECTED NOT DETECTED Final   Vibrio cholerae NOT DETECTED NOT DETECTED Final   Enteroaggregative E coli (EAEC) NOT DETECTED NOT DETECTED Final   Enteropathogenic E coli (EPEC) NOT DETECTED NOT DETECTED Final   Enterotoxigenic E coli (ETEC) NOT DETECTED NOT DETECTED Final   Shiga like toxin producing E coli (STEC) NOT DETECTED NOT DETECTED Final    Shigella/Enteroinvasive E coli (EIEC) NOT DETECTED NOT DETECTED Final   Cryptosporidium NOT DETECTED NOT DETECTED Final   Cyclospora cayetanensis NOT DETECTED NOT DETECTED Final   Entamoeba histolytica NOT DETECTED NOT DETECTED Final   Giardia lamblia NOT DETECTED NOT DETECTED Final   Adenovirus F40/41 NOT DETECTED NOT DETECTED Final   Astrovirus NOT DETECTED NOT DETECTED Final   Norovirus GI/GII NOT DETECTED NOT DETECTED Final   Rotavirus A NOT DETECTED NOT DETECTED Final   Sapovirus (I, II, IV, and V) NOT DETECTED NOT DETECTED Final    Comment: Performed at [facility], [address] [place], [place] [address]  C Difficile Quick Screen w PCR reflex     Status: None   Collection Time: 12/27/18  5:27 PM   Specimen: Stool  Result Value Ref Range Status   C Diff antigen NEGATIVE NEGATIVE Final   C Diff toxin NEGATIVE NEGATIVE Final   C Diff interpretation No C. difficile detected.  Final    Comment: Performed at [facility], [place],  [place] [address]         Radiology Studies: Ct Abdomen Pelvis W Contrast  Result Date: 12/27/2018 CLINICAL DATA:  Abdomen pain EXAM: CT ABDOMEN AND PELVIS WITH CONTRAST TECHNIQUE: Multidetector CT imaging of the abdomen and pelvis was performed using the standard protocol following bolus administration of intravenous contrast. CONTRAST:  179m OMNIPAQUE IOHEXOL 300 MG/ML  SOLN COMPARISON:  CT 10/31/2018, 09/29/2018 FINDINGS: Lower chest: No acute abnormality. Hepatobiliary: No focal liver abnormality is seen. No gallstones, gallbladder wall thickening, or biliary dilatation. Pancreas: Unremarkable. No pancreatic ductal dilatation or surrounding inflammatory changes. Spleen: Normal in size without focal abnormality. Adrenals/Urinary Tract: Adrenal glands are unremarkable. Kidneys are normal, without renal calculi, focal lesion, or hydronephrosis. Bladder is unremarkable. Stomach/Bowel: Stomach is within normal limits. No  dilated small bowel. Featureless colon with wall thickening and mild surrounding inflammatory change consistent with pancolitis. Vascular/Lymphatic: Nonaneurysmal aorta. Interval placement of IVC filter at L2-L3 level. Chronic DVT in the left common iliac vein. Reproductive: Uterus and bilateral adnexa are unremarkable. Other: Small free fluid in the pelvis.  No free air Musculoskeletal: No acute or significant osseous findings. IMPRESSION: 1. Pancolitis, consistent with history of ulcerative colitis. Negative for obstruction or perforation. 2. Small free fluid in the pelvis 3. Interim placement of IVC filter. Chronic DVT in the left common iliac vein. Electronically Signed   By: KDonavan FoilM.D.   On: 12/27/2018 19:35   Dg Abd Portable 1v  Result Date: 12/28/2018 CLINICAL DATA:  Abdominal pain EXAM: PORTABLE ABDOMEN - 1 VIEW COMPARISON:  CT 12/27/2018 FINDINGS: IVC filter to the right of L2-L3. Mild air distension and featureless appearance of the colon, consistent with colitis. Nonobstructed gas pattern. No obvious intramural air. IMPRESSION: Nonobstructed gas pattern. Featureless appearance of the colon, compatible with history of colitis. No intramural air. Electronically Signed   By: KDonavan FoilM.D.   On: 12/28/2018 16:29        Scheduled Meds: . diphenhydrAMINE  50 mg Oral Once  . feeding supplement (ENSURE ENLIVE)  237 mL Oral BID BM  . methylPREDNISolone (SOLU-MEDROL) injection  60 mg Intravenous Q12H  . pantoprazole  40 mg Oral Daily   Continuous Infusions: . dextrose 5 % and 0.45 % NaCl with KCl 10 mEq/L 100 mL/hr at 12/28/18 2123  . inFLIXimab (REMICADE) infusion    . ondansetron (ZOFRAN) IV    . piperacillin-tazobactam (ZOSYN)  IV 3.375 g (12/29/18 0511)     LOS: 2 days   Time spent= 35 mins    Ankit CArsenio Loader MD [facility]  If 7PM-7AM, please contact night-coverage [email or web address] 12/29/2018, 8:50 AM

## 2018-12-30 LAB — CBC
HCT: 26.6 % — ABNORMAL LOW (ref 36.0–46.0)
Hemoglobin: 7.8 g/dL — ABNORMAL LOW (ref 12.0–15.0)
MCH: 23.4 pg — ABNORMAL LOW (ref 26.0–34.0)
MCHC: 29.3 g/dL — ABNORMAL LOW (ref 30.0–36.0)
MCV: 79.6 fL — ABNORMAL LOW (ref 80.0–100.0)
Platelets: 274 10*3/uL (ref 150–400)
RBC: 3.34 MIL/uL — ABNORMAL LOW (ref 3.87–5.11)
RDW: 14.9 % (ref 11.5–15.5)
WBC: 5.6 10*3/uL (ref 4.0–10.5)
nRBC: 0 % (ref 0.0–0.2)

## 2018-12-30 LAB — BASIC METABOLIC PANEL
Anion gap: 8 (ref 5–15)
BUN: <5 mg/dL — ABNORMAL LOW (ref 6–20)
CO2: 25 mmol/L (ref 22–32)
Calcium: 8 mg/dL — ABNORMAL LOW (ref 8.9–10.3)
Chloride: 105 mmol/L (ref 98–111)
Creatinine, Ser: 0.66 mg/dL (ref 0.44–1.00)
GFR calc Af Amer: >60 mL/min (ref >60)
GFR calc non Af Amer: >60 mL/min (ref >60)
Glucose, Bld: 214 mg/dL — ABNORMAL HIGH (ref 70–99)
Potassium: 3.6 mmol/L (ref 3.5–5.1)
Sodium: 138 mmol/L (ref 135–145)

## 2018-12-30 LAB — HEPARIN LEVEL (UNFRACTIONATED): Heparin Unfractionated: 0.41 IU/mL (ref 0.30–0.70)

## 2018-12-30 LAB — MAGNESIUM: Magnesium: 2.3 mg/dL (ref 1.7–2.4)

## 2018-12-30 NOTE — Progress Notes (Signed)
PROGRESS NOTE    Monica Ortega  LDJ:[id number] DOB: [birth year] DOA: 12/27/2018 PCP: [name], DO   Brief Narrative:  28 year old with history of ulcerative colitis, recent DVT with IVC filter in place and on Eliquis, depression/anxiety came to the ER with complaints of fevers, abdominal pain and diarrhea.  C. difficile study was negative.  Admitted for ulcerative colitis flare started on IV steroids.  Also due to fever she was started on Zosyn.    Assessment & Plan:   Principal Problem:   Universal ulcerative colitis with rectal bleeding (HCC) Active Problems:   Abdominal pain   DVT (deep venous thrombosis) (HCC)   Hyponatremia  Abdominal pain with diarrhea secondary to acute severe colitis flare, pain still persist - Symptomatic management at this time along with IV Solu-Medrol.  Remicade to be started by GI.  IV Zosyn.  C. difficile studies negative.  GI panel-negative.  Continue clear liquid diet for another day at least -Appreciate GI input -KUB nonobstructive pattern  Iron deficiency anemia - Currently on IV iron for at least 1 more day then switch over to p.o.  History of DVT and PE -No obvious signs of bleeding therefore on heparin drip.  At home she is on Eliquis.  Once her symptoms have improved she can be switched over.  DVT prophylaxis: Heparin drip Code Status: Full code Family Communication: None at bedside Disposition Plan: Maintain hospital stay until abdominal symptoms improved  Consultants:   GI  Procedures:   None  Antimicrobials:   Zosyn day 3   Subjective: Had about 5 episodes of watery diarrhea, noted slight blood in 1 of the episodes.  Otherwise feeling little better. Review of Systems Otherwise negative except as per HPI, including: General = no fevers, chills, dizziness, malaise, fatigue HEENT/EYES = negative for pain, redness, loss of vision, double vision, blurred vision, loss of hearing, sore throat, hoarseness,  dysphagia Cardiovascular= negative for chest pain, palpitation, murmurs, lower extremity swelling Respiratory/lungs= negative for shortness of breath, cough, hemoptysis, wheezing, mucus production Gastrointestinal= negative for nausea, vomiting,melena, hematemesis Genitourinary= negative for Dysuria, Hematuria, Change in Urinary Frequency MSK = Negative for arthralgia, myalgias, Back Pain, Joint swelling  Neurology= Negative for headache, seizures, numbness, tingling  Psychiatry= Negative for anxiety, depression, suicidal and homocidal ideation Allergy/Immunology= Medication/Food allergy as listed  Skin= Negative for Rash, lesions, ulcers, itching    Objective: Vitals:   12/29/18 1232 12/29/18 1427 12/29/18 2049 12/30/18 0506  BP: 114/71 109/67 117/74 107/73  Pulse: 86 86 81 72  Resp:  15 18 16   Temp: 98.4 F (36.9 C) 97.8 F (36.6 C) 98.4 F (36.9 C) 98 F (36.7 C)  TempSrc: Oral Oral Oral Oral  SpO2:  98% 99% 98%  Weight:      Height:        Intake/Output Summary (Last 24 hours) at 12/30/2018 1047 Last data filed at 12/30/2018 1000 Gross per 24 hour  Intake 3206.67 ml  Output 650 ml  Net 2556.67 ml   Filed Weights   12/27/18 1233 12/27/18 2215  Weight: 63.5 kg 64.9 kg    Examination:  Constitutional: NAD, calm, comfortable Eyes: PERRL, lids and conjunctivae normal ENMT: Mucous membranes are moist. Posterior pharynx clear of any exudate or lesions.Normal dentition.  Neck: normal, supple, no masses, no thyromegaly Respiratory: clear to auscultation bilaterally, no wheezing, no crackles. Normal respiratory effort. No accessory muscle use.  Cardiovascular: Regular rate and rhythm, no murmurs / rubs / gallops. No extremity edema. 2+ pedal pulses. No carotid  bruits.  Abdomen: no tenderness, no masses palpated. No hepatosplenomegaly. Bowel sounds positive.  Musculoskeletal: no clubbing / cyanosis. No joint deformity upper and lower extremities. Good ROM, no contractures.  Normal muscle tone.  Skin: no rashes, lesions, ulcers. No induration Neurologic: CN 2-12 grossly intact. Sensation intact, DTR normal. Strength 5/5 in all 4.  Psychiatric: Normal judgment and insight. Alert and oriented x 3. Normal mood.      Data Reviewed:   CBC: Recent Labs  Lab 12/27/18 1246 12/28/18 0312 12/29/18 0247 12/30/18 0325  WBC 12.9* 5.3 6.4 5.6  NEUTROABS 9.3*  --   --   --   HGB 10.7* 8.7* 8.5* 7.8*  HCT 35.6* 29.8* 28.6* 26.6*  MCV 79.1* 80.1 79.0* 79.6*  PLT 354 277 281 697   Basic Metabolic Panel: Recent Labs  Lab 12/27/18 1246 12/28/18 0312 12/29/18 0247 12/30/18 0325  NA 132* 138 137 138  K 2.8* 4.5 3.6 3.6  CL 96* 107 105 105  CO2 23 25 22 25   GLUCOSE 157* 169* 182* 214*  BUN 11 7 <5* <5*  CREATININE 1.22* 0.73 0.87 0.66  CALCIUM 8.7* 8.2* 8.2* 8.0*  MG  --   --   --  2.3   GFR: Estimated Creatinine Clearance: 105.3 mL/min (by C-G formula based on SCr of 0.66 mg/dL). Liver Function Tests: Recent Labs  Lab 12/27/18 1246 12/28/18 0312  AST 14* 10*  ALT 14 12  ALKPHOS 50 44  BILITOT 0.9 0.5  PROT 5.6* 5.1*  ALBUMIN 2.6* 2.2*   Recent Labs  Lab 12/27/18 1246  LIPASE 30   No results for input(s): AMMONIA in the last 168 hours. Coagulation Profile: Recent Labs  Lab 12/29/18 1300  INR 1.3*   Cardiac Enzymes: No results for input(s): CKTOTAL, CKMB, CKMBINDEX, TROPONINI in the last 168 hours. BNP (last 3 results) No results for input(s): PROBNP in the last 8760 hours. HbA1C: No results for input(s): HGBA1C in the last 72 hours. CBG: No results for input(s): GLUCAP in the last 168 hours. Lipid Profile: No results for input(s): CHOL, HDL, LDLCALC, TRIG, CHOLHDL, LDLDIRECT in the last 72 hours. Thyroid Function Tests: No results for input(s): TSH, T4TOTAL, FREET4, T3FREE, THYROIDAB in the last 72 hours. Anemia Panel: Recent Labs    12/28/18 0812  TIBC 200*  IRON 6*   Sepsis Labs: No results for input(s): PROCALCITON,  LATICACIDVEN in the last 168 hours.  Recent Results (from the past 240 hour(s))  Gastrointestinal Panel by PCR , Stool     Status: None   Collection Time: 12/27/18  5:27 PM   Specimen: Stool  Result Value Ref Range Status   Campylobacter species NOT DETECTED NOT DETECTED Final   Plesimonas shigelloides NOT DETECTED NOT DETECTED Final   Salmonella species NOT DETECTED NOT DETECTED Final   Yersinia enterocolitica NOT DETECTED NOT DETECTED Final   Vibrio species NOT DETECTED NOT DETECTED Final   Vibrio cholerae NOT DETECTED NOT DETECTED Final   Enteroaggregative E coli (EAEC) NOT DETECTED NOT DETECTED Final   Enteropathogenic E coli (EPEC) NOT DETECTED NOT DETECTED Final   Enterotoxigenic E coli (ETEC) NOT DETECTED NOT DETECTED Final   Shiga like toxin producing E coli (STEC) NOT DETECTED NOT DETECTED Final   Shigella/Enteroinvasive E coli (EIEC) NOT DETECTED NOT DETECTED Final   Cryptosporidium NOT DETECTED NOT DETECTED Final   Cyclospora cayetanensis NOT DETECTED NOT DETECTED Final   Entamoeba histolytica NOT DETECTED NOT DETECTED Final   Giardia lamblia NOT DETECTED NOT DETECTED Final  Adenovirus F40/41 NOT DETECTED NOT DETECTED Final   Astrovirus NOT DETECTED NOT DETECTED Final   Norovirus GI/GII NOT DETECTED NOT DETECTED Final   Rotavirus A NOT DETECTED NOT DETECTED Final   Sapovirus (I, II, IV, and V) NOT DETECTED NOT DETECTED Final    Comment: Performed at [facility], [place], [place] [address]  C Difficile Quick Screen w PCR reflex     Status: None   Collection Time: 12/27/18  5:27 PM   Specimen: Stool  Result Value Ref Range Status   C Diff antigen NEGATIVE NEGATIVE Final   C Diff toxin NEGATIVE NEGATIVE Final   C Diff interpretation No C. difficile detected.  Final    Comment: Performed at [facility], [place] [address], [place], [place] [address]  Novel Coronavirus,NAA,(SEND-OUT TO REF LAB - TAT 24-48 hrs); Hosp Order     Status: None    Collection Time: 12/27/18  8:12 PM   Specimen: Nasopharyngeal Swab; Respiratory  Result Value Ref Range Status   SARS-CoV-2, NAA NOT DETECTED NOT DETECTED Final    Comment: (NOTE) This test was developed and its performance characteristics determined by [employer]. This test has not been FDA cleared or approved. This test has been authorized by FDA under an Emergency Use Authorization (EUA). This test is only authorized for the duration of time the declaration that circumstances exist justifying the authorization of the emergency use of in vitro diagnostic tests for detection of SARS-CoV-2 virus and/or diagnosis of COVID-19 infection under section 564(b)(1) of the Act, 21 U.S.C. 630ZSW-1(U)(9), unless the authorization is terminated or revoked sooner. When diagnostic testing is negative, the possibility of a false negative result should be considered in the context of a patient's recent exposures and the presence of clinical signs and symptoms consistent with COVID-19. An individual without symptoms of COVID-19 and who is not shedding SARS-CoV-2 virus would expect to have a negative (not detected) result in this assay. Performed  At: [facility] [address] [place], [place] [id number] Rush Farmer MD GU:[id number]    [place]  Final    Comment: Performed at [facility], [place] [address], [place], [place] [address]  Culture, blood (routine x 2)     Status: None (Preliminary result)   Collection Time: 12/28/18  4:00 PM   Specimen: BLOOD  Result Value Ref Range Status   Specimen Description BLOOD RIGHT ANTECUBITAL  Final   Special Requests   Final    BOTTLES DRAWN AEROBIC ONLY Blood Culture adequate volume   Culture   Final    NO GROWTH < 24 HOURS Performed at [facility], [place] [address], [place], [place] [address]    Report Status PENDING  Incomplete  Culture, blood (routine x 2)     Status: None (Preliminary result)    Collection Time: 12/28/18  4:09 PM   Specimen: BLOOD RIGHT HAND  Result Value Ref Range Status   Specimen Description BLOOD RIGHT HAND  Final   Special Requests   Final    BOTTLES DRAWN AEROBIC ONLY Blood Culture results may not be optimal due to an inadequate volume of blood received in culture bottles   Culture   Final    NO GROWTH < 24 HOURS Performed at [facility], [place] [address] [place], [place] [address]    Report Status PENDING  Incomplete         Radiology Studies: Dg Abd Portable 1v  Result  Date: 12/28/2018 CLINICAL DATA:  Abdominal pain EXAM: PORTABLE ABDOMEN - 1 VIEW COMPARISON:  CT 12/27/2018 FINDINGS: IVC filter to the right of L2-L3. Mild air distension and featureless appearance of the colon, consistent with colitis. Nonobstructed gas pattern. No obvious intramural air. IMPRESSION: Nonobstructed gas pattern. Featureless appearance of the colon, compatible with history of colitis. No intramural air. Electronically Signed   By: Donavan Foil M.D.   On: 12/28/2018 16:29        Scheduled Meds: . feeding supplement (ENSURE ENLIVE)  237 mL Oral BID BM  . methylPREDNISolone (SOLU-MEDROL) injection  60 mg Intravenous Q12H  . pantoprazole  40 mg Oral Daily   Continuous Infusions: . dextrose 5 % and 0.45 % NaCl with KCl 10 mEq/L 100 mL/hr at 12/30/18 0324  . heparin 1,100 Units/hr (12/29/18 1334)  . ondansetron (ZOFRAN) IV    . piperacillin-tazobactam (ZOSYN)  IV 3.375 g (12/30/18 0631)     LOS: 3 days   Time spent= 25 mins    Ankit Arsenio Loader, MD [facility]  If 7PM-7AM, please contact night-coverage [email or web address] 12/30/2018, 10:47 AM

## 2018-12-30 NOTE — Progress Notes (Signed)
ANTICOAGULATION CONSULT NOTE  Pharmacy Consult for IV heparin Indication: history of DVT  No Known Allergies  Patient Measurements: Height: 5' 9"  (175.3 cm) Weight: 143 lb 1.3 oz (64.9 kg) IBW/kg (Calculated) : 66.2 Heparin Dosing Weight: actual body weight   Vital Signs: Temp: 98 F (36.7 C) (06/21 0506) Temp Source: Oral (06/21 0506) BP: 107/73 (06/21 0506) Pulse Rate: 72 (06/21 0506)  Labs: Recent Labs    12/28/18 0312 12/29/18 0247 12/29/18 1300 12/29/18 2001 12/30/18 0325  HGB 8.7* 8.5*  --   --  7.8*  HCT 29.8* 28.6*  --   --  26.6*  PLT 277 281  --   --  274  APTT  --   --  30  --   --   LABPROT  --   --  15.7*  --   --   INR  --   --  1.3*  --   --   HEPARINUNFRC  --   --  <0.10* 0.31 0.41  CREATININE 0.73 0.87  --   --  0.66    Estimated Creatinine Clearance: 105.3 mL/min (by C-G formula based on SCr of 0.66 mg/dL).   Medical History: Past Medical History:  Diagnosis Date  . Ankle sprain   . Anxiety   . Colitis   . Depression    denies hospitalization or medication  . DVT (deep venous thrombosis) ([place]) 10/31/2018  . DVT (deep venous thrombosis) ([place])   . Hyponatremia   . Universal ulcerative colitis with rectal bleeding ([place]) 10/31/2018    Medications:  PTA Apixaban 39m PO BID-last dose 12/27/2018 at 1000  Assessment: 28 y/oF with PMH of DVT in April 2020 s/p IVC filter on Apixaban PTA admitted for acute severe ulcerative colitis flare. Worsening bloody diarrhea on admission. Per GI notes 6/20, now with less rectal bleeding. [facility]x [facility].   Heparin level therapeutic this AM. Hgb decreased to 7.8, Pltc WNL. Per patient, bloody stools improving. No infusion issues per nursing.    Goal of Therapy:  Heparin level 0.3-0.7 units/ml  Monitor platelets by anticoagulation protocol: Yes   Plan:   Continue heparin infusion at 1100 units/hr    Daily CBC, heparin level  Monitor closely for s/sx of  worsening bleeding.     JLindell Spar PharmD, BCPS Clinical Pharmacist [phone]/21/2020 9:07 AM

## 2018-12-30 NOTE — Progress Notes (Addendum)
    Progress Note   Subjective  Abdominal pain persists but is improved. Minimal bleeding. No fever overnight.   Objective  Vital signs in last 24 hours: Temp:  [97.7 F (36.5 C)-98.4 F (36.9 C)] 98 F (36.7 C) (06/21 0506) Pulse Rate:  [72-86] 72 (06/21 0506) Resp:  [15-18] 16 (06/21 0506) BP: (106-117)/(67-78) 107/73 (06/21 0506) SpO2:  [98 %-99 %] 98 % (06/21 0506) Last BM Date: 12/29/18  General: Alert, well-developed, in NAD Heart:  Regular rate and rhythm; no murmurs Chest: Clear to ascultation bilaterally Abdomen:  Soft, mild lower abdominal pain and nondistended. Normal bowel sounds, without guarding, and without rebound.   Extremities:  Trace pedal edema. Neurologic:  Alert and  oriented x4; grossly normal neurologically. Psych:  Alert and cooperative. Normal mood and affect.  Intake/Output from previous day: 06/20 0701 - 06/21 0700 In: 1350.6 [P.O.:600; I.V.:465.6; IV Piggyback:285] Out: 650 [Urine:650] Intake/Output this shift: No intake/output data recorded.  Lab Results: Recent Labs    12/28/18 0312 12/29/18 0247 12/30/18 0325  WBC 5.3 6.4 5.6  HGB 8.7* 8.5* 7.8*  HCT 29.8* 28.6* 26.6*  PLT 277 281 274   BMET Recent Labs    12/28/18 0312 12/29/18 0247 12/30/18 0325  NA 138 137 138  K 4.5 3.6 3.6  CL 107 105 105  CO2 25 22 25   GLUCOSE 169* 182* 214*  BUN 7 <5* <5*  CREATININE 0.73 0.87 0.66  CALCIUM 8.2* 8.2* 8.0*   LFT Recent Labs    12/28/18 0312  PROT 5.1*  ALBUMIN 2.2*  AST 10*  ALT 12  ALKPHOS 44  BILITOT 0.5   PT/INR Recent Labs    12/29/18 1300  LABPROT 15.7*  INR 1.3*     Studies/Results: Dg Abd Portable 1v  Result Date: 12/28/2018 CLINICAL DATA:  Abdominal pain EXAM: PORTABLE ABDOMEN - 1 VIEW COMPARISON:  CT 12/27/2018 FINDINGS: IVC filter to the right of L2-L3. Mild air distension and featureless appearance of the colon, consistent with colitis. Nonobstructed gas pattern. No obvious intramural air. IMPRESSION:  Nonobstructed gas pattern. Featureless appearance of the colon, compatible with history of colitis. No intramural air. Electronically Signed   By: Donavan Foil M.D.   On: 12/28/2018 16:29      Assessment & Recommendations   1. Severe ulcerative colitis, slightly improved since admission. Received Remicade 10 mg/kg yesterday. No fevers overnight, persistent abdominal pain has improved, bleeding improved however diarrhea is unchanged. Continue IV Zosyn. Continue IV Solu-Medrol. Continue clear liquids today.    2. Anemia, iron deficiency. Stopped PO iron to avoid potential iron side effects impacting GI symptoms. IV iron today.   3. DVT. IV heparin started. Proceed with hypercoagulable evaluation per primary service or with Hematology consult.   Dr. Lyndel Safe is covering [facility] service starting on Monday.    LOS: 3 days   Norberto Sorenson T. Fuller Plan MD 12/30/2018, 9:10 AM

## 2018-12-31 DIAGNOSIS — K51011 Ulcerative (chronic) pancolitis with rectal bleeding: Secondary | ICD-10-CM

## 2018-12-31 DIAGNOSIS — R1030 Lower abdominal pain, unspecified: Secondary | ICD-10-CM

## 2018-12-31 DIAGNOSIS — I82423 Acute embolism and thrombosis of iliac vein, bilateral: Secondary | ICD-10-CM

## 2018-12-31 LAB — CBC
HCT: 26.3 % — ABNORMAL LOW (ref 36.0–46.0)
Hemoglobin: 7.7 g/dL — ABNORMAL LOW (ref 12.0–15.0)
MCH: 23.4 pg — ABNORMAL LOW (ref 26.0–34.0)
MCHC: 29.3 g/dL — ABNORMAL LOW (ref 30.0–36.0)
MCV: 79.9 fL — ABNORMAL LOW (ref 80.0–100.0)
Platelets: 277 10*3/uL (ref 150–400)
RBC: 3.29 MIL/uL — ABNORMAL LOW (ref 3.87–5.11)
RDW: 14.9 % (ref 11.5–15.5)
WBC: 2.9 10*3/uL — ABNORMAL LOW (ref 4.0–10.5)
nRBC: 0 % (ref 0.0–0.2)

## 2018-12-31 LAB — BASIC METABOLIC PANEL
Anion gap: 7 (ref 5–15)
BUN: <5 mg/dL — ABNORMAL LOW (ref 6–20)
CO2: 27 mmol/L (ref 22–32)
Calcium: 8 mg/dL — ABNORMAL LOW (ref 8.9–10.3)
Chloride: 106 mmol/L (ref 98–111)
Creatinine, Ser: 0.71 mg/dL (ref 0.44–1.00)
GFR calc Af Amer: >60 mL/min (ref >60)
GFR calc non Af Amer: >60 mL/min (ref >60)
Glucose, Bld: 232 mg/dL — ABNORMAL HIGH (ref 70–99)
Potassium: 3.6 mmol/L (ref 3.5–5.1)
Sodium: 140 mmol/L (ref 135–145)

## 2018-12-31 LAB — HEPARIN LEVEL (UNFRACTIONATED): Heparin Unfractionated: 0.59 IU/mL (ref 0.30–0.70)

## 2018-12-31 LAB — MAGNESIUM: Magnesium: 2.4 mg/dL (ref 1.7–2.4)

## 2018-12-31 MED ORDER — DICYCLOMINE HCL 10 MG PO CAPS
10.0000 mg | ORAL_CAPSULE | Freq: Three times a day (TID) | ORAL | Status: DC
  Administered 2018-12-31 – 2019-01-03 (×12): 10 mg via ORAL
  Filled 2018-12-31 (×13): qty 1

## 2018-12-31 NOTE — Progress Notes (Signed)
PROGRESS NOTE  Monica Ortega VQQ:[id number] DOB: [birth year] DOA: 12/27/2018 PCP: [name], DO   LOS: 4 days   Patient is from: Home  Brief Narrative / Interim history: 28 year old with history of ulcerative colitis, recent DVT with IVC filter in place and on Eliquis, depression/anxiety came to the ER with complaints of fevers, abdominal pain and diarrhea.  C. difficile study was negative.  Admitted for ulcerative colitis flare started on IV steroids.  Also due to fever she was started on Zosyn.   Assessment & Plan: Abdominal pain/diarrhea due to severe ulcerative colitis flareup -C. difficile, GI panel and blood cultures negative. -Status post Remicade -On IV Solu-Medrol and Zosyn -Symptoms improving. -GI following. -Advancing diet to soft  Iron deficiency anemia: Anemia panel consistent with iron deficiency. -Received iron infusion on 6/20 -We will give another iron infusion in 4 to 7 days if she remains inpatient and transition to p.o.  History of DVT and PE -Continue heparin drip and eventually transition to home Eliquis.   Scheduled Meds: . feeding supplement (ENSURE ENLIVE)  237 mL Oral BID BM  . methylPREDNISolone (SOLU-MEDROL) injection  60 mg Intravenous Q12H  . pantoprazole  40 mg Oral Daily   Continuous Infusions: . dextrose 5 % and 0.45 % NaCl with KCl 10 mEq/L 100 mL/hr at 12/30/18 2331  . heparin 1,100 Units/hr (12/30/18 1323)  . ondansetron (ZOFRAN) IV    . piperacillin-tazobactam (ZOSYN)  IV 3.375 g (12/31/18 0535)   PRN Meds:.acetaminophen, alum & mag hydroxide-simeth, loperamide, morphine injection, ondansetron **OR** ondansetron (ZOFRAN) IV, oxyCODONE   DVT prophylaxis: On heparin drip Code Status: Full code Family Communication: Patient update family let me know if any question Disposition Plan: Remains inpatient on IV Solu-Medrol and Zosyn for severe ulcerative colitis.  Subjective: No major events overnight of this morning.  Reports improvement  in pain and diarrhea.  Denies chest pain, dyspnea, palpitation or dizziness.   Objective: Vitals:   12/30/18 0506 12/30/18 1503 12/30/18 2019 12/31/18 0521  BP: 107/73 124/89 120/78 118/75  Pulse: 72 60 61 (!) 48  Resp: 16 18 16 16   Temp: 98 F (36.7 C) 98.1 F (36.7 C) 98.1 F (36.7 C) 97.7 F (36.5 C)  TempSrc: Oral Oral Oral Oral  SpO2: 98% 100% 100% 98%  Weight:      Height:        Intake/Output Summary (Last 24 hours) at 12/31/2018 0844 Last data filed at 12/30/2018 1800 Gross per 24 hour  Intake 1825.47 ml  Output -  Net 1825.47 ml   Filed Weights   12/27/18 1233 12/27/18 2215  Weight: 63.5 kg 64.9 kg    Examination:  GENERAL: No acute distress.  Appears well.  HEENT: MMM.  Vision and hearing grossly intact.  NECK: Supple.  No JVD.  LUNGS:  No IWOB. Good air movement bilaterally. HEART:  RRR. Heart sounds normal.  ABD: Bowel sounds present. Soft. Non tender.  MSK/EXT:  Moves all extremities. No apparent deformity. No edema bilaterally.  SKIN: no apparent skin lesion or wound NEURO: Awake, alert and oriented appropriately.  No gross deficit.  PSYCH: Calm. Normal affect.   Consultants:   GI  Procedures:   None  Microbiology: . COVID-19 negative . Blood cultures negative . C. difficile negative . GI panel negative  Antimicrobials: Anti-infectives (From admission, onward)   Start     Dose/Rate Route Frequency Ordered Stop   12/28/18 1600  piperacillin-tazobactam (ZOSYN) IVPB 3.375 g     3.375 g 12.5 mL/hr  over 240 Minutes Intravenous Every 8 hours 12/28/18 1543         Data Reviewed: I have independently reviewed following labs and imaging studies  CBC: Recent Labs  Lab 12/27/18 1246 12/28/18 0312 12/29/18 0247 12/30/18 0325 12/31/18 0319  WBC 12.9* 5.3 6.4 5.6 2.9*  NEUTROABS 9.3*  --   --   --   --   HGB 10.7* 8.7* 8.5* 7.8* 7.7*  HCT 35.6* 29.8* 28.6* 26.6* 26.3*  MCV 79.1* 80.1 79.0* 79.6* 79.9*  PLT 354 277 281 274 003   Basic  Metabolic Panel: Recent Labs  Lab 12/27/18 1246 12/28/18 0312 12/29/18 0247 12/30/18 0325 12/31/18 0319  NA 132* 138 137 138 140  K 2.8* 4.5 3.6 3.6 3.6  CL 96* 107 105 105 106  CO2 23 25 22 25 27   GLUCOSE 157* 169* 182* 214* 232*  BUN 11 7 <5* <5* <5*  CREATININE 1.22* 0.73 0.87 0.66 0.71  CALCIUM 8.7* 8.2* 8.2* 8.0* 8.0*  MG  --   --   --  2.3 2.4   GFR: Estimated Creatinine Clearance: 105.3 mL/min (by C-G formula based on SCr of 0.71 mg/dL). Liver Function Tests: Recent Labs  Lab 12/27/18 1246 12/28/18 0312  AST 14* 10*  ALT 14 12  ALKPHOS 50 44  BILITOT 0.9 0.5  PROT 5.6* 5.1*  ALBUMIN 2.6* 2.2*   Recent Labs  Lab 12/27/18 1246  LIPASE 30   No results for input(s): AMMONIA in the last 168 hours. Coagulation Profile: Recent Labs  Lab 12/29/18 1300  INR 1.3*   Cardiac Enzymes: No results for input(s): CKTOTAL, CKMB, CKMBINDEX, TROPONINI in the last 168 hours. BNP (last 3 results) No results for input(s): PROBNP in the last 8760 hours. HbA1C: No results for input(s): HGBA1C in the last 72 hours. CBG: No results for input(s): GLUCAP in the last 168 hours. Lipid Profile: No results for input(s): CHOL, HDL, LDLCALC, TRIG, CHOLHDL, LDLDIRECT in the last 72 hours. Thyroid Function Tests: No results for input(s): TSH, T4TOTAL, FREET4, T3FREE, THYROIDAB in the last 72 hours. Anemia Panel: No results for input(s): VITAMINB12, FOLATE, FERRITIN, TIBC, IRON, RETICCTPCT in the last 72 hours. Urine analysis:    Component Value Date/Time   COLORURINE YELLOW 12/27/2018 1445   APPEARANCEUR CLEAR 12/27/2018 1445   LABSPEC 1.004 (L) 12/27/2018 1445   PHURINE 5.0 12/27/2018 1445   GLUCOSEU NEGATIVE 12/27/2018 1445   HGBUR SMALL (A) 12/27/2018 1445   BILIRUBINUR NEGATIVE 12/27/2018 1445   KETONESUR NEGATIVE 12/27/2018 1445   PROTEINUR NEGATIVE 12/27/2018 1445   NITRITE NEGATIVE 12/27/2018 1445   LEUKOCYTESUR NEGATIVE 12/27/2018 1445   Sepsis Labs: Invalid  input(s): PROCALCITONIN, LACTICIDVEN  Recent Results (from the past 240 hour(s))  Gastrointestinal Panel by PCR , Stool     Status: None   Collection Time: 12/27/18  5:27 PM   Specimen: Stool  Result Value Ref Range Status   Campylobacter species NOT DETECTED NOT DETECTED Final   Plesimonas shigelloides NOT DETECTED NOT DETECTED Final   Salmonella species NOT DETECTED NOT DETECTED Final   Yersinia enterocolitica NOT DETECTED NOT DETECTED Final   Vibrio species NOT DETECTED NOT DETECTED Final   Vibrio cholerae NOT DETECTED NOT DETECTED Final   Enteroaggregative E coli (EAEC) NOT DETECTED NOT DETECTED Final   Enteropathogenic E coli (EPEC) NOT DETECTED NOT DETECTED Final   Enterotoxigenic E coli (ETEC) NOT DETECTED NOT DETECTED Final   Shiga like toxin producing E coli (STEC) NOT DETECTED NOT DETECTED Final  Shigella/Enteroinvasive E coli (EIEC) NOT DETECTED NOT DETECTED Final   Cryptosporidium NOT DETECTED NOT DETECTED Final   Cyclospora cayetanensis NOT DETECTED NOT DETECTED Final   Entamoeba histolytica NOT DETECTED NOT DETECTED Final   Giardia lamblia NOT DETECTED NOT DETECTED Final   Adenovirus F40/41 NOT DETECTED NOT DETECTED Final   Astrovirus NOT DETECTED NOT DETECTED Final   Norovirus GI/GII NOT DETECTED NOT DETECTED Final   Rotavirus A NOT DETECTED NOT DETECTED Final   Sapovirus (I, II, IV, and V) NOT DETECTED NOT DETECTED Final    Comment: Performed at [facility], [address], [place], [place] [address]  C Difficile Quick Screen w PCR reflex     Status: None   Collection Time: 12/27/18  5:27 PM   Specimen: Stool  Result Value Ref Range Status   C Diff antigen NEGATIVE NEGATIVE Final   C Diff toxin NEGATIVE NEGATIVE Final   C Diff interpretation No C. difficile detected.  Final    Comment: Performed at [facility], [place] [address], [place], [place] [address]  Novel Coronavirus,NAA,(SEND-OUT TO REF LAB - TAT 24-48 hrs); Hosp Order     Status: None    Collection Time: 12/27/18  8:12 PM   Specimen: Nasopharyngeal Swab; Respiratory  Result Value Ref Range Status   SARS-CoV-2, NAA NOT DETECTED NOT DETECTED Final    Comment: (NOTE) This test was developed and its performance characteristics determined by [employer]. This test has not been FDA cleared or approved. This test has been authorized by FDA under an Emergency Use Authorization (EUA). This test is only authorized for the duration of time the declaration that circumstances exist justifying the authorization of the emergency use of in vitro diagnostic tests for detection of SARS-CoV-2 virus and/or diagnosis of COVID-19 infection under section 564(b)(1) of the Act, 21 U.S.C. 098JXB-1(Y)(7), unless the authorization is terminated or revoked sooner. When diagnostic testing is negative, the possibility of a false negative result should be considered in the context of a patient's recent exposures and the presence of clinical signs and symptoms consistent with COVID-19. An individual without symptoms of COVID-19 and who is not shedding SARS-CoV-2 virus would expect to have a negative (not detected) result in this assay. Performed  At: [facility] [address] [place], [place] [id number] Rush Farmer MD QM:[id number]    [place]  Final    Comment: Performed at [facility], [place] [address], [place], [place] [address]  Culture, blood (routine x 2)     Status: None (Preliminary result)   Collection Time: 12/28/18  4:00 PM   Specimen: BLOOD  Result Value Ref Range Status   Specimen Description BLOOD RIGHT ANTECUBITAL  Final   Special Requests   Final    BOTTLES DRAWN AEROBIC ONLY Blood Culture adequate volume   Culture   Final    NO GROWTH 2 DAYS Performed at [facility], [place] [address], [place], [place] [address]    Report Status PENDING  Incomplete  Culture, blood (routine x 2)     Status: None (Preliminary result)    Collection Time: 12/28/18  4:09 PM   Specimen: BLOOD RIGHT HAND  Result Value Ref Range Status   Specimen Description BLOOD RIGHT HAND  Final   Special Requests   Final    BOTTLES DRAWN AEROBIC ONLY Blood Culture results may not be optimal due to an inadequate volume of blood received in culture bottles   Culture   Final  NO GROWTH 2 DAYS Performed at [facility], [place] [address], [place], [place] [address]    Report Status PENDING  Incomplete     Radiology Studies: No results found.   Taye T. [place]  If 7PM-7AM, please contact night-coverage [email or web address] Password [facility] 12/31/2018, 8:44 AM

## 2018-12-31 NOTE — Progress Notes (Signed)
Inpatient Diabetes Program Recommendations  AACE/ADA: New Consensus Statement on Inpatient Glycemic Control (2015)  Target Ranges:  Prepandial:   less than 140 mg/dL      Peak postprandial:   less than 180 mg/dL (1-2 hours)      Critically ill patients:  140 - 180 mg/dL    Results for [name], Monica "ANNIE" (MRN [id number]) as of 12/31/2018 11:08  Ref. Range 12/27/2018 12:46 12/28/2018 03:12 12/29/2018 02:47 12/30/2018 03:25 12/31/2018 03:19  Glucose Latest Ref Range: 70 - 99 mg/dL 157 (H) 169 (H) 182 (H) 214 (H) 232 (H)    Admit with: Universal ulcerative colitis with rectal bleeding    Current Orders: Solumedrol 60 mg BID       MD- Note that lab glucose elevated the last 2 days.  Getting Solumedrol.  May consider starting Novolog Sensitive Correction Scale/ SSI (0-9 units) TID AC + HS while patient getting IV steroids       --Will follow patient during hospitalization--  Wyn Quaker RN, MSN, CDE Diabetes Coordinator Inpatient Glycemic Control Team Team Pager: [phone] (8a-5p)

## 2018-12-31 NOTE — Progress Notes (Addendum)
Daily Rounding Note  12/31/2018, 10:26 AM  LOS: 4 days   SUBJECTIVE:   Chief complaint: Severe ulcerative colitis. Abdominal pain is better, but persists.  Smaller but still loose stools, had about 5 of these in the last 24 hours.  Blood is still present but in smaller amounts. Continues to tolerate clear liquids, trying some Ensure now but feels like she needs to take it easy with this as she feels slight upset stomach.  Has had intermittent heartburn in the last 24 hours.  However she is hoping we can advance her diet so that she could get some bread and bland foods. Overall feels better. During if she can get Bentyl because this helped her prandial associated upset stomach when she was here last time. No fevers in more than 24 hours.  OBJECTIVE:         Vital signs in last 24 hours:    Temp:  [97.7 F (36.5 C)-98.1 F (36.7 C)] 97.7 F (36.5 C) (06/22 0521) Pulse Rate:  [48-61] 48 (06/22 0521) Resp:  [16-18] 16 (06/22 0521) BP: (118-124)/(75-89) 118/75 (06/22 0521) SpO2:  [98 %-100 %] 98 % (06/22 0521) Last BM Date: 12/30/18 Filed Weights   12/27/18 1233 12/27/18 2215  Weight: 63.5 kg 64.9 kg   General: Looks better, pale as always, alert, comfortable Heart: RRR. Chest: Clear bilaterally. Abdomen: Bowel sounds normal but hypoactive.  No distention.  Tenderness, no guarding or rebound, most notable in the right abdomen. Extremities: No CCE. Neuro/Psych: Oriented x3.  Moves all 4 limbs, no tremor no gross weakness.  Calm.  Intake/Output from previous day: 06/21 0701 - 06/22 0700 In: 1825.5 [I.V.:1614.3; IV Piggyback:211.2] Out: -   Intake/Output this shift: Total I/O In: 240 [P.O.:240] Out: -   Lab Results: Recent Labs    12/29/18 0247 12/30/18 0325 12/31/18 0319  WBC 6.4 5.6 2.9*  HGB 8.5* 7.8* 7.7*  HCT 28.6* 26.6* 26.3*  PLT 281 274 277   BMET Recent Labs    12/29/18 0247 12/30/18 0325  12/31/18 0319  NA 137 138 140  K 3.6 3.6 3.6  CL 105 105 106  CO2 22 25 27   GLUCOSE 182* 214* 232*  BUN <5* <5* <5*  CREATININE 0.87 0.66 0.71  CALCIUM 8.2* 8.0* 8.0*   LFT No results for input(s): PROT, ALBUMIN, AST, ALT, ALKPHOS, BILITOT, BILIDIR, IBILI in the last 72 hours. PT/INR Recent Labs    12/29/18 1300  LABPROT 15.7*  INR 1.3*   Hepatitis Panel No results for input(s): HEPBSAG, HCVAB, HEPAIGM, HEPBIGM in the last 72 hours.  Studies/Results: No results found.   Scheduled Meds: . feeding supplement (ENSURE ENLIVE)  237 mL Oral BID BM  . methylPREDNISolone (SOLU-MEDROL) injection  60 mg Intravenous Q12H  . pantoprazole  40 mg Oral Daily   Continuous Infusions: . dextrose 5 % and 0.45 % NaCl with KCl 10 mEq/L 100 mL/hr at 12/31/18 0955  . heparin 1,100 Units/hr (12/31/18 0958)  . ondansetron (ZOFRAN) IV    . piperacillin-tazobactam (ZOSYN)  IV 3.375 g (12/31/18 0535)   PRN Meds:.acetaminophen, alum & mag hydroxide-simeth, loperamide, morphine injection, ondansetron **OR** ondansetron (ZOFRAN) IV, oxyCODONE  ASSESMENT:   *    Severe UC. Received high-dose Remicade 6/21, this is her fourth dose since she started Remicade in late April 2020. Day 4 Zosyn.  Day 5 of IV Solu-Medrol.  *    IDA.  Received Nulecit, Ferric Gluconate, infusion 6/21.  Oral  iron discontinued due to potential GI side effects.  *    DVT, diagnosed 10/2018, on Eliquis at home.  Currently on IV heparin    PLAN   *    Advanced her diet to soft diet and allow her to choose menu items.  *    Adding Bentyl before meals and at bedtime.  Already on Protonix to cover any reflux.    Azucena Freed  12/31/2018, 10:26 AM Phone [phone]   Attending physician's note   I have taken an interval history, reviewed the chart and examined the patient. I agree with the Advanced Practitioner's note, impression and recommendations.   Damaris Hippo , MD [phone]     Attending physician's  note   I have taken an interval history, reviewed the chart and examined the patient. I agree with the Advanced Practitioner's note, impression and recommendations.   Severe ulcerative colitis currently on IV Solu-Medrol 60 mg twice daily and status post Remicade infusion yesterday Slowly improving with decreased diarrhea and rectal bleeding Can transition to oral prednisone 40 mg twice daily tomorrow,  if continues to improve Advance diet as tolerated   Raliegh Ip Denzil Magnuson , MD [phone]

## 2018-12-31 NOTE — Progress Notes (Signed)
ANTICOAGULATION CONSULT NOTE  Pharmacy Consult for IV heparin Indication: history of DVT  No Known Allergies  Patient Measurements: Height: 5' 9"  (175.3 cm) Weight: 143 lb 1.3 oz (64.9 kg) IBW/kg (Calculated) : 66.2 Heparin Dosing Weight: actual body weight   Vital Signs: Temp: 97.7 F (36.5 C) (06/22 0521) Temp Source: Oral (06/22 0521) BP: 118/75 (06/22 0521) Pulse Rate: 48 (06/22 0521)  Labs: Recent Labs    12/29/18 0247  12/29/18 1300 12/29/18 2001 12/30/18 0325 12/31/18 0319  HGB 8.5*  --   --   --  7.8* 7.7*  HCT 28.6*  --   --   --  26.6* 26.3*  PLT 281  --   --   --  274 277  APTT  --   --  30  --   --   --   LABPROT  --   --  15.7*  --   --   --   INR  --   --  1.3*  --   --   --   HEPARINUNFRC  --    < > <0.10* 0.31 0.41 0.59  CREATININE 0.87  --   --   --  0.66 0.71   < > = values in this interval not displayed.   Estimated Creatinine Clearance: 105.3 mL/min (by C-G formula based on SCr of 0.71 mg/dL).  Medical History: Past Medical History:  Diagnosis Date  . Ankle sprain   . Anxiety   . Colitis   . Depression    denies hospitalization or medication  . DVT (deep venous thrombosis) ([place]) 10/31/2018  . DVT (deep venous thrombosis) ([place])   . Hyponatremia   . Universal ulcerative colitis with rectal bleeding ([place]) 10/31/2018    Medications:  PTA Apixaban for DVT, dose 43m PO BID-LD 12/27/2018 at 10a  Assessment: 28 y/oF with PMH of DVT in April 2020 s/p IVC filter on Apixaban PTA admitted for acute severe ulcerative colitis flare. Worsening bloody diarrhea on admission. Per GI notes 6/20, now with less rectal bleeding. [facility].   Today, 12/31/2018 Heparin level 0.59 units/ml, therapeutic this AM Hgb decreased to 7.7, Pltc WNL Per patient, bloody stools improving. No infusion issues per nursing.    Goal of Therapy:  Heparin level 0.3-0.7 units/ml  Monitor platelets by anticoagulation  protocol: Yes   Plan:   Continue heparin infusion at 1100 units/hr    Daily CBC, heparin level  Monitor closely for s/sx of worsening bleeding.     GMinda DittoPharmD Clinical Pharmacist [phone] 49916/21/2020 9:07 AM

## 2019-01-01 ENCOUNTER — Ambulatory Visit: Payer: BC Managed Care – PPO | Admitting: Internal Medicine

## 2019-01-01 LAB — BASIC METABOLIC PANEL
Anion gap: 6 (ref 5–15)
BUN: 5 mg/dL — ABNORMAL LOW (ref 6–20)
CO2: 28 mmol/L (ref 22–32)
Calcium: 7.8 mg/dL — ABNORMAL LOW (ref 8.9–10.3)
Chloride: 103 mmol/L (ref 98–111)
Creatinine, Ser: 0.98 mg/dL (ref 0.44–1.00)
GFR calc Af Amer: >60 mL/min (ref >60)
GFR calc non Af Amer: >60 mL/min (ref >60)
Glucose, Bld: 240 mg/dL — ABNORMAL HIGH (ref 70–99)
Potassium: 3.8 mmol/L (ref 3.5–5.1)
Sodium: 137 mmol/L (ref 135–145)

## 2019-01-01 LAB — CBC
HCT: 25.4 % — ABNORMAL LOW (ref 36.0–46.0)
Hemoglobin: 7.5 g/dL — ABNORMAL LOW (ref 12.0–15.0)
MCH: 23.4 pg — ABNORMAL LOW (ref 26.0–34.0)
MCHC: 29.5 g/dL — ABNORMAL LOW (ref 30.0–36.0)
MCV: 79.1 fL — ABNORMAL LOW (ref 80.0–100.0)
Platelets: 299 10*3/uL (ref 150–400)
RBC: 3.21 MIL/uL — ABNORMAL LOW (ref 3.87–5.11)
RDW: 14.7 % (ref 11.5–15.5)
WBC: 3.9 10*3/uL — ABNORMAL LOW (ref 4.0–10.5)
nRBC: 0.8 % — ABNORMAL HIGH (ref 0.0–0.2)

## 2019-01-01 LAB — MAGNESIUM: Magnesium: 2.2 mg/dL (ref 1.7–2.4)

## 2019-01-01 LAB — HEPARIN LEVEL (UNFRACTIONATED): Heparin Unfractionated: 0.75 IU/mL — ABNORMAL HIGH (ref 0.30–0.70)

## 2019-01-01 MED ORDER — HEPARIN (PORCINE) 25000 UT/250ML-% IV SOLN
800.0000 [IU]/h | INTRAVENOUS | Status: DC
  Filled 2019-01-01 (×2): qty 250

## 2019-01-01 MED ORDER — PREDNISONE 20 MG PO TABS
40.0000 mg | ORAL_TABLET | Freq: Two times a day (BID) | ORAL | Status: DC
  Administered 2019-01-01 – 2019-01-03 (×4): 40 mg via ORAL
  Filled 2019-01-01 (×4): qty 2

## 2019-01-01 NOTE — Progress Notes (Addendum)
Daily Rounding Note  01/01/2019, 9:39 AM  LOS: 5 days   SUBJECTIVE:   Chief complaint: Severe UC. Tolerating solid food, some abdominal cramping ~ 30 to 60 m after meals but that is tolerable.  Stools moving to more formed but still very soft.  Saw more blood with BM late last night, O/w not much blood.  No nausea.  Walking in hall without prob. No urinary frequency or polydipsia.      OBJECTIVE:         Vital signs in last 24 hours:    Temp:  [97.7 F (36.5 C)-98.3 F (36.8 C)] 97.9 F (36.6 C) (06/23 0453) Pulse Rate:  [58-69] 69 (06/23 0453) Resp:  [16-20] 17 (06/23 0453) BP: (109-121)/(69-75) 109/69 (06/23 0453) SpO2:  [99 %-100 %] 99 % (06/23 0453) Last BM Date: 12/31/18 Filed Weights   12/27/18 1233 12/27/18 2215  Weight: 63.5 kg 64.9 kg   General: Pale, comfortable, alert.  Face beginning to look a bit puffy/cushingoid. Heart: RRR Chest: Clear bilaterally.  No labored breathing. Abdomen: Soft.  Minimal tenderness.  No HSM, masses, bruits.  Active bowel sounds. Extremities: No CCE. Neuro/Psych: Calm, pleasant, fluid speech.  Fully alert and oriented.  No gross weakness.  No tremors.  Intake/Output from previous day: 06/22 0701 - 06/23 0700 In: 720 [P.O.:720] Out: -   Intake/Output this shift: No intake/output data recorded.  Lab Results: Recent Labs    12/30/18 0325 12/31/18 0319 01/01/19 0402  WBC 5.6 2.9* 3.9*  HGB 7.8* 7.7* 7.5*  HCT 26.6* 26.3* 25.4*  PLT 274 277 299   BMET Recent Labs    12/30/18 0325 12/31/18 0319 01/01/19 0402  NA 138 140 137  K 3.6 3.6 3.8  CL 105 106 103  CO2 25 27 28   GLUCOSE 214* 232* 240*  BUN <5* <5* 5*  CREATININE 0.66 0.71 0.98  CALCIUM 8.0* 8.0* 7.8*   LFT No results for input(s): PROT, ALBUMIN, AST, ALT, ALKPHOS, BILITOT, BILIDIR, IBILI in the last 72 hours. PT/INR Recent Labs    12/29/18 1300  LABPROT 15.7*  INR 1.3*   Hepatitis Panel No  results for input(s): HEPBSAG, HCVAB, HEPAIGM, HEPBIGM in the last 72 hours.  Studies/Results: No results found.   Scheduled Meds: . dicyclomine  10 mg Oral TID AC & HS  . feeding supplement (ENSURE ENLIVE)  237 mL Oral BID BM  . methylPREDNISolone (SOLU-MEDROL) injection  60 mg Intravenous Q12H  . pantoprazole  40 mg Oral Daily   Continuous Infusions: . dextrose 5 % and 0.45 % NaCl with KCl 10 mEq/L 100 mL/hr at 01/01/19 0718  . heparin 800 Units/hr (01/01/19 0858)  . ondansetron (ZOFRAN) IV    . piperacillin-tazobactam (ZOSYN)  IV 3.375 g (01/01/19 0515)   PRN Meds:.acetaminophen, alum & mag hydroxide-simeth, loperamide, morphine injection, ondansetron **OR** ondansetron (ZOFRAN) IV, oxyCODONE   ASSESMENT:   *   Severe ulcerative colitis.  High-dose Remicade infused 6/21, her fourth dose after initial Remicade late 10/2018. Day 5 Zosyn.  Day 6 IV Solu-Medrol.  *   Iron deficiency anemia.  Feraheme on 4/23 and 4/25, Ferric gluconate (Nulecit) on 6/21.    *    DVT, DX 10/2018.  Eliquis at home.  Currently on IV heparin  *   Hyperglycemia.  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  *    Leukopenia, ? related to Remicade?   PLAN   *  Stop Zosyn given breathing symptoms.  No  fevers for last few days and leukocytosis.  *   CBC, BMET in AM.    *    ?  Add insulin prn?  *   Switch to prednisone?   Restart Eliquis?   *    Continue dicyclomine.  *   Home tmrw?  *   outpt referral to hematology for completion of workup of DVT.  Though may just be related to UC, still need to r/o other underlying causea.    Azucena Freed  01/01/2019, 9:39 AM Phone [phone]    Attending physician's note   I have taken an interval history, reviewed the chart and examined the patient. I agree with the Advanced Practitioner's note, impression and recommendations.   Severe refractory pan-ulcerative colitis s/p Remicade 43m/kg 12/30/2018, IV solumedrol 677mQ12. IDA s/p Feraheme x 2 DVT s/p IVC  filter, on heparin (eliquis at home) [facility]- S/E of steroids.  Feels better today with much less diarrhea.  Minimal rectal bleeding. No abdo pain. Tolerating p.o.  Plan: -Switch solumedrol to PO prednisone 40 mg BID.  Once symptoms completely resolve, gradual taper 1074meek until 40m17mhen 2.5 mg/week until done (if we can get her off pred) -Rpt 10mg39mRemicade in 4 weeks. If responds, then may consider 5mg/k61m8weekly for maintenance. -Stop Zosyn. -Hyperglycemia management per hospitalist service. Can add Insulin. ?baseline HBA1c. -Will discuss with Dr. Perry Henrene Pastording Imuran. Nl TPMT 4/25. -If refractory to medical treatment or if pt desires in future, total proctocolectomy would be a choice.  Certainly, will be curative of UC. -Ambulate.  Raj GuCarmell AustriaebaueVelora [phone]

## 2019-01-01 NOTE — Progress Notes (Signed)
PROGRESS NOTE  Monica Ortega DXA:[id number] DOB: 15-Mar-1991 DOA: 12/27/2018 PCP: [name], DO   LOS: 5 days   Patient is from: Home  Brief Narrative / Interim history: 28 year old with history of ulcerative colitis, recent DVT with IVC filter in place and on Eliquis, depression/anxiety came to the ER with complaints of fevers, abdominal pain and diarrhea.  C. difficile study was negative.  Admitted for ulcerative colitis flare started on IV steroids.  Also due to fever she was started on Zosyn.   Assessment & Plan: Abdominal pain/diarrhea due to severe ulcerative colitis flareup: Improving. -[facility] managing. -C. difficile, GI panel and blood cultures negative. -Status post Remicade on 6/21. -IV Solu-Medrol 6/19-- -IV Zosyn 6/19-6/23 -Symptoms improving. -Advancing diet  Iron deficiency anemia: Anemia panel consistent with iron deficiency. -Received iron infusion on 6/21 -We will give another iron infusion in 4 to 5 days if she remains inpatient and transition to p.o.  History of DVT and PE -Continue heparin drip and eventually transition to home Eliquis.  Scheduled Meds: . dicyclomine  10 mg Oral TID AC & HS  . feeding supplement (ENSURE ENLIVE)  237 mL Oral BID BM  . methylPREDNISolone (SOLU-MEDROL) injection  60 mg Intravenous Q12H  . pantoprazole  40 mg Oral Daily   Continuous Infusions: . dextrose 5 % and 0.45 % NaCl with KCl 10 mEq/L 100 mL/hr at 01/01/19 0718  . heparin 800 Units/hr (01/01/19 0858)  . ondansetron (ZOFRAN) IV     PRN Meds:.acetaminophen, alum & mag hydroxide-simeth, loperamide, morphine injection, ondansetron **OR** ondansetron (ZOFRAN) IV, oxyCODONE   DVT prophylaxis: On heparin drip Code Status: Full code Family Communication: Patient update family let me know if any question Disposition Plan: Remains inpatient on IV Solu-Medrol and Zosyn for severe ulcerative colitis.  Subjective: She noted some blood in the stool when she had a bowel  movement last night.  Also reports some back pain from staying in bed a lot.  Denies cardiopulmonary symptoms.  Abdominal pain improved.  Denies nausea or vomiting.   Objective: Vitals:   12/31/18 1418 12/31/18 2029 01/01/19 0453 01/01/19 1403  BP: 121/69 111/75 109/69 112/71  Pulse: (!) 58 (!) 59 69 (!) 56  Resp: 20 16 17 16   Temp: 97.7 F (36.5 C) 98.3 F (36.8 C) 97.9 F (36.6 C) 98 F (36.7 C)  TempSrc: Oral Oral Oral Oral  SpO2: 100% 100% 99% 100%  Weight:      Height:        Intake/Output Summary (Last 24 hours) at 01/01/2019 1536 Last data filed at 01/01/2019 1402 Gross per 24 hour  Intake 720 ml  Output -  Net 720 ml   Filed Weights   12/27/18 1233 12/27/18 2215  Weight: 63.5 kg 64.9 kg    Examination:  GENERAL: No acute distress.  Appears well.  HEENT: MMM.  Vision and hearing grossly intact.  NECK: Supple.  No JVD.  LUNGS:  No IWOB. Good air movement bilaterally. HEART:  RRR. Heart sounds normal.  ABD: Bowel sounds present. Soft.  Mild diffuse tenderness to palpation. MSK/EXT:  Moves all extremities. No apparent deformity. No edema bilaterally.  SKIN: no apparent skin lesion or wound NEURO: Awake, alert and oriented appropriately.  No gross deficit.  PSYCH: Calm. Normal affect.  Consultants:   GI  Procedures:   None  Microbiology: . COVID-19 negative . Blood cultures negative . C. difficile negative . GI panel negative  Antimicrobials: Anti-infectives (From admission, onward)   Start  Dose/Rate Route Frequency Ordered Stop   12/28/18 1600  piperacillin-tazobactam (ZOSYN) IVPB 3.375 g  Status:  Discontinued     3.375 g 12.5 mL/hr over 240 Minutes Intravenous Every 8 hours 12/28/18 1543 01/01/19 0942      Data Reviewed: I have independently reviewed following labs and imaging studies  CBC: Recent Labs  Lab 12/27/18 1246 12/28/18 0312 12/29/18 0247 12/30/18 0325 12/31/18 0319 01/01/19 0402  WBC 12.9* 5.3 6.4 5.6 2.9* 3.9*   NEUTROABS 9.3*  --   --   --   --   --   HGB 10.7* 8.7* 8.5* 7.8* 7.7* 7.5*  HCT 35.6* 29.8* 28.6* 26.6* 26.3* 25.4*  MCV 79.1* 80.1 79.0* 79.6* 79.9* 79.1*  PLT 354 277 281 274 277 119   Basic Metabolic Panel: Recent Labs  Lab 12/28/18 0312 12/29/18 0247 12/30/18 0325 12/31/18 0319 01/01/19 0402  NA 138 137 138 140 137  K 4.5 3.6 3.6 3.6 3.8  CL 107 105 105 106 103  CO2 25 22 25 27 28   GLUCOSE 169* 182* 214* 232* 240*  BUN 7 <5* <5* <5* 5*  CREATININE 0.73 0.87 0.66 0.71 0.98  CALCIUM 8.2* 8.2* 8.0* 8.0* 7.8*  MG  --   --  2.3 2.4 2.2   GFR: Estimated Creatinine Clearance: 86 mL/min (by C-G formula based on SCr of 0.98 mg/dL). Liver Function Tests: Recent Labs  Lab 12/27/18 1246 12/28/18 0312  AST 14* 10*  ALT 14 12  ALKPHOS 50 44  BILITOT 0.9 0.5  PROT 5.6* 5.1*  ALBUMIN 2.6* 2.2*   Recent Labs  Lab 12/27/18 1246  LIPASE 30   No results for input(s): AMMONIA in the last 168 hours. Coagulation Profile: Recent Labs  Lab 12/29/18 1300  INR 1.3*   Cardiac Enzymes: No results for input(s): CKTOTAL, CKMB, CKMBINDEX, TROPONINI in the last 168 hours. BNP (last 3 results) No results for input(s): PROBNP in the last 8760 hours. HbA1C: No results for input(s): HGBA1C in the last 72 hours. CBG: No results for input(s): GLUCAP in the last 168 hours. Lipid Profile: No results for input(s): CHOL, HDL, LDLCALC, TRIG, CHOLHDL, LDLDIRECT in the last 72 hours. Thyroid Function Tests: No results for input(s): TSH, T4TOTAL, FREET4, T3FREE, THYROIDAB in the last 72 hours. Anemia Panel: No results for input(s): VITAMINB12, FOLATE, FERRITIN, TIBC, IRON, RETICCTPCT in the last 72 hours. Urine analysis:    Component Value Date/Time   COLORURINE YELLOW 12/27/2018 1445   APPEARANCEUR CLEAR 12/27/2018 1445   LABSPEC 1.004 (L) 12/27/2018 1445   PHURINE 5.0 12/27/2018 1445   GLUCOSEU NEGATIVE 12/27/2018 1445   HGBUR SMALL (A) 12/27/2018 1445   BILIRUBINUR NEGATIVE  12/27/2018 1445   KETONESUR NEGATIVE 12/27/2018 1445   PROTEINUR NEGATIVE 12/27/2018 1445   NITRITE NEGATIVE 12/27/2018 1445   LEUKOCYTESUR NEGATIVE 12/27/2018 1445   Sepsis Labs: Invalid input(s): PROCALCITONIN, LACTICIDVEN  Recent Results (from the past 240 hour(s))  Gastrointestinal Panel by PCR , Stool     Status: None   Collection Time: 12/27/18  5:27 PM   Specimen: Stool  Result Value Ref Range Status   Campylobacter species NOT DETECTED NOT DETECTED Final   Plesimonas shigelloides NOT DETECTED NOT DETECTED Final   Salmonella species NOT DETECTED NOT DETECTED Final   Yersinia enterocolitica NOT DETECTED NOT DETECTED Final   Vibrio species NOT DETECTED NOT DETECTED Final   Vibrio cholerae NOT DETECTED NOT DETECTED Final   Enteroaggregative E coli (EAEC) NOT DETECTED NOT DETECTED Final   Enteropathogenic E  coli (EPEC) NOT DETECTED NOT DETECTED Final   Enterotoxigenic E coli (ETEC) NOT DETECTED NOT DETECTED Final   Shiga like toxin producing E coli (STEC) NOT DETECTED NOT DETECTED Final   Shigella/Enteroinvasive E coli (EIEC) NOT DETECTED NOT DETECTED Final   Cryptosporidium NOT DETECTED NOT DETECTED Final   Cyclospora cayetanensis NOT DETECTED NOT DETECTED Final   Entamoeba histolytica NOT DETECTED NOT DETECTED Final   Giardia lamblia NOT DETECTED NOT DETECTED Final   Adenovirus F40/41 NOT DETECTED NOT DETECTED Final   Astrovirus NOT DETECTED NOT DETECTED Final   Norovirus GI/GII NOT DETECTED NOT DETECTED Final   Rotavirus A NOT DETECTED NOT DETECTED Final   Sapovirus (I, II, IV, and V) NOT DETECTED NOT DETECTED Final    Comment: Performed at [facility], [address] [place], [place] [address]  C Difficile Quick Screen w PCR reflex     Status: None   Collection Time: 12/27/18  5:27 PM   Specimen: Stool  Result Value Ref Range Status   C Diff antigen NEGATIVE NEGATIVE Final   C Diff toxin NEGATIVE NEGATIVE Final   C Diff interpretation No C. difficile  detected.  Final    Comment: Performed at [facility], [place] [address], [place], [place] [address]  Novel Coronavirus,NAA,(SEND-OUT TO REF LAB - TAT 24-48 hrs); Hosp Order     Status: None   Collection Time: 12/27/18  8:12 PM   Specimen: Nasopharyngeal Swab; Respiratory  Result Value Ref Range Status   SARS-CoV-2, NAA NOT DETECTED NOT DETECTED Final    Comment: (NOTE) This test was developed and its performance characteristics determined by [employer]. This test has not been FDA cleared or approved. This test has been authorized by FDA under an Emergency Use Authorization (EUA). This test is only authorized for the duration of time the declaration that circumstances exist justifying the authorization of the emergency use of in vitro diagnostic tests for detection of SARS-CoV-2 virus and/or diagnosis of COVID-19 infection under section 564(b)(1) of the Act, 21 U.S.C. 706CBJ-6(E)(8), unless the authorization is terminated or revoked sooner. When diagnostic testing is negative, the possibility of a false negative result should be considered in the context of a patient's recent exposures and the presence of clinical signs and symptoms consistent with COVID-19. An individual without symptoms of COVID-19 and who is not shedding SARS-CoV-2 virus would expect to have a negative (not detected) result in this assay. Performed  At: [facility] [address] [place], [place] [id number] Rush Farmer MD VP:[id number]    [place]  Final    Comment: Performed at [facility], [place] [address] [place], [place] [address]  Culture, blood (routine x 2)     Status: None (Preliminary result)   Collection Time: 12/28/18  4:00 PM   Specimen: BLOOD  Result Value Ref Range Status   Specimen Description BLOOD RIGHT ANTECUBITAL  Final   Special Requests   Final    BOTTLES DRAWN AEROBIC ONLY Blood Culture adequate volume   Culture   Final    NO  GROWTH 4 DAYS Performed at [facility], [place] [address], [place], [place] [address]    Report Status PENDING  Incomplete  Culture, blood (routine x 2)     Status: None (Preliminary result)   Collection Time: 12/28/18  4:09 PM   Specimen: BLOOD RIGHT HAND  Result Value Ref Range Status   Specimen Description BLOOD RIGHT HAND  Final   Special Requests  Final    BOTTLES DRAWN AEROBIC ONLY Blood Culture results may not be optimal due to an inadequate volume of blood received in culture bottles   Culture   Final    NO GROWTH 4 DAYS Performed at [facility], [place] [address], [place], [place] [address]    Report Status PENDING  Incomplete     Radiology Studies: No results found.   Taye T. Elmore  If 7PM-7AM, please contact night-coverage [email or web address] Password [facility] 01/01/2019, 3:36 PM

## 2019-01-01 NOTE — Progress Notes (Signed)
ANTICOAGULATION CONSULT NOTE  Pharmacy Consult for IV heparin Indication: history of DVT on Apixaban, IVC filter  No Known Allergies  Patient Measurements: Height: 5' 9"  (175.3 cm) Weight: 143 lb 1.3 oz (64.9 kg) IBW/kg (Calculated) : 66.2 Heparin Dosing Weight: actual body weight   Vital Signs: Temp: 97.9 F (36.6 C) (06/23 0453) Temp Source: Oral (06/23 0453) BP: 109/69 (06/23 0453) Pulse Rate: 69 (06/23 0453)  Labs: Recent Labs    12/29/18 1300  12/30/18 0325 12/31/18 0319 01/01/19 0402  HGB  --    < > 7.8* 7.7* 7.5*  HCT  --   --  26.6* 26.3* 25.4*  PLT  --   --  274 277 299  APTT 30  --   --   --   --   LABPROT 15.7*  --   --   --   --   INR 1.3*  --   --   --   --   HEPARINUNFRC <0.10*   < > 0.41 0.59 0.75*  CREATININE  --   --  0.66 0.71 0.98   < > = values in this interval not displayed.   Estimated Creatinine Clearance: 86 mL/min (by C-G formula based on SCr of 0.98 mg/dL).  Medical History: Past Medical History:  Diagnosis Date  . Ankle sprain   . Anxiety   . Colitis   . Depression    denies hospitalization or medication  . DVT (deep venous thrombosis) ([place]) 10/31/2018  . DVT (deep venous thrombosis) ([place])   . Hyponatremia   . Universal ulcerative colitis with rectal bleeding ([place]) 10/31/2018   Medications:  PTA Apixaban for DVT, dose 45m PO BID-LD 12/27/2018 at 10a  Assessment: 28 y/oF with PMH of DVT in April 2020 s/p IVC filter on Apixaban PTA admitted for acute severe ulcerative colitis flare. Worsening bloody diarrhea on admission. Per GI notes 6/20, now with less rectal bleeding. [facility].   Today, 01/01/2019 Heparin level 0.75 units/ml, sl above desired range Hgb decreased to 7.5, Pltc WNL Still with slight blood in stools. No infusion issues per nursing Advanced to soft diet  Goal of Therapy:  Heparin level 0.3-0.7 units/ml  Monitor platelets by anticoagulation protocol: Yes    Plan:   Reduce heparin infusion to 800 units/hr    Daily CBC, heparin level  Monitor closely for s/sx of worsening bleeding.   GMinda DittoPharmD Clinical Pharmacist [phone]/21/2020 9:07 AM

## 2019-01-01 NOTE — Progress Notes (Signed)
Pharmacy Antibiotic/Remicade Note  Monica Ortega is a 28 y.o. female admitted on 12/27/2018 with severe ulcerative colitis with bloody stools.  Pharmacy has been consulted for Zosyn dosing for intra-abdominal infection. WBC down to 5.3, Tmax 100.8 F.  Pharmacy was asked to arrange for Remicade 10 mg/kg, due to persistent severe UC with bloody stools. Pt has had 2 Quantiferon-TB GOLD tests, with no evidence of latent TB infection detected.  Given 6/20  Medical history also includes bilateral DVT, chronic L common iliac DVT (S/P IVC filter, on apixaban at home), AKI (resolved), iron deficiency anemia.  Plan: Zosyn 3.375 gm IV Q 8 hrs - 4 hr infusion Diet advanced to soft, still with slight blood in stool Monitor renal function (hx AKI, resolved)  Height: 5' 9"  (175.3 cm) Weight: 143 lb 1.3 oz (64.9 kg) IBW/kg (Calculated) : 66.2  Temp (24hrs), Avg:98 F (36.7 C), Min:97.7 F (36.5 C), Max:98.3 F (36.8 C)  Recent Labs  Lab 12/28/18 0312 12/29/18 0247 12/30/18 0325 12/31/18 0319 01/01/19 0402  WBC 5.3 6.4 5.6 2.9* 3.9*  CREATININE 0.73 0.87 0.66 0.71 0.98    Estimated Creatinine Clearance: 86 mL/min (by C-G formula based on SCr of 0.98 mg/dL).    No Known Allergies  Microbiology results: 6/18 C.diff PCR: neg 6/18 GI panel: neg 6/18 COVID: neg 6/19 BCx: NGTD  Previous:  10/02/18 Quantiferon-TB Gold: indeterminate (per note with lab result, may be due to anergy) 02/19/18 Quantiferon-TB Gold: negative  Thank you for allowing pharmacy to be a part of this patient's care.  Minda Ditto PharmD Clinical Pharmacist [phone] 01/01/2019 7:49 AM

## 2019-01-02 ENCOUNTER — Encounter: Payer: Self-pay | Admitting: Family Medicine

## 2019-01-02 DIAGNOSIS — R739 Hyperglycemia, unspecified: Secondary | ICD-10-CM

## 2019-01-02 DIAGNOSIS — R001 Bradycardia, unspecified: Secondary | ICD-10-CM

## 2019-01-02 DIAGNOSIS — R7303 Prediabetes: Secondary | ICD-10-CM

## 2019-01-02 LAB — CULTURE, BLOOD (ROUTINE X 2)
Culture: NO GROWTH
Culture: NO GROWTH
Special Requests: ADEQUATE

## 2019-01-02 LAB — CBC
HCT: 26.7 % — ABNORMAL LOW (ref 36.0–46.0)
Hemoglobin: 7.8 g/dL — ABNORMAL LOW (ref 12.0–15.0)
MCH: 23.1 pg — ABNORMAL LOW (ref 26.0–34.0)
MCHC: 29.2 g/dL — ABNORMAL LOW (ref 30.0–36.0)
MCV: 79.2 fL — ABNORMAL LOW (ref 80.0–100.0)
Platelets: 352 K/uL (ref 150–400)
RBC: 3.37 MIL/uL — ABNORMAL LOW (ref 3.87–5.11)
RDW: 14.9 % (ref 11.5–15.5)
WBC: 5.6 K/uL (ref 4.0–10.5)
nRBC: 0.9 % — ABNORMAL HIGH (ref 0.0–0.2)

## 2019-01-02 LAB — BASIC METABOLIC PANEL
Anion gap: 6 (ref 5–15)
BUN: 6 mg/dL (ref 6–20)
CO2: 29 mmol/L (ref 22–32)
Calcium: 8.1 mg/dL — ABNORMAL LOW (ref 8.9–10.3)
Chloride: 104 mmol/L (ref 98–111)
Creatinine, Ser: 0.78 mg/dL (ref 0.44–1.00)
GFR calc Af Amer: >60 mL/min (ref >60)
GFR calc non Af Amer: >60 mL/min (ref >60)
Glucose, Bld: 279 mg/dL — ABNORMAL HIGH (ref 70–99)
Potassium: 4.1 mmol/L (ref 3.5–5.1)
Sodium: 139 mmol/L (ref 135–145)

## 2019-01-02 LAB — GLUCOSE, CAPILLARY
Glucose-Capillary: 224 mg/dL — ABNORMAL HIGH (ref 70–99)
Glucose-Capillary: 220 mg/dL — ABNORMAL HIGH (ref 70–99)
Glucose-Capillary: 274 mg/dL — ABNORMAL HIGH (ref 70–99)
Glucose-Capillary: 309 mg/dL — ABNORMAL HIGH (ref 70–99)
Glucose-Capillary: 128 mg/dL — ABNORMAL HIGH (ref 70–99)

## 2019-01-02 LAB — HEMOGLOBIN A1C
Hgb A1c MFr Bld: 6.4 % — ABNORMAL HIGH (ref 4.8–5.6)
Mean Plasma Glucose: 136.98 mg/dL

## 2019-01-02 LAB — HEPARIN LEVEL (UNFRACTIONATED): Heparin Unfractionated: 0.4 [IU]/mL (ref 0.30–0.70)

## 2019-01-02 LAB — MAGNESIUM: Magnesium: 2.2 mg/dL (ref 1.7–2.4)

## 2019-01-02 LAB — TSH: TSH: 0.865 u[IU]/mL (ref 0.350–4.500)

## 2019-01-02 LAB — T4, FREE: Free T4: 0.85 ng/dL (ref 0.61–1.12)

## 2019-01-02 MED ORDER — INSULIN ASPART 100 UNIT/ML ~~LOC~~ SOLN
0.0000 [IU] | Freq: Three times a day (TID) | SUBCUTANEOUS | Status: DC
  Administered 2019-01-02: 3 [IU] via SUBCUTANEOUS
  Administered 2019-01-02: 7 [IU] via SUBCUTANEOUS
  Administered 2019-01-02: 3 [IU] via SUBCUTANEOUS
  Administered 2019-01-03: 2 [IU] via SUBCUTANEOUS

## 2019-01-02 MED ORDER — AZATHIOPRINE 50 MG PO TABS
25.0000 mg | ORAL_TABLET | Freq: Every day | ORAL | Status: DC
  Administered 2019-01-03: 25 mg via ORAL
  Filled 2019-01-02: qty 1

## 2019-01-02 MED ORDER — APIXABAN 5 MG PO TABS
5.0000 mg | ORAL_TABLET | Freq: Two times a day (BID) | ORAL | Status: DC
  Administered 2019-01-02 – 2019-01-03 (×3): 5 mg via ORAL
  Filled 2019-01-02 (×4): qty 1

## 2019-01-02 MED ORDER — SODIUM CHLORIDE 0.9 % IV SOLN
510.0000 mg | Freq: Once | INTRAVENOUS | Status: AC
  Administered 2019-01-02: 510 mg via INTRAVENOUS
  Filled 2019-01-02: qty 17

## 2019-01-02 NOTE — Progress Notes (Signed)
[facility] for IV heparin discontinued, to Apixaban today  Indication: history of DVT on Apixaban, IVC filter  No Known Allergies  Patient Measurements: Height: 5' 9"  (175.3 cm) Weight: 143 lb 1.3 oz (64.9 kg) IBW/kg (Calculated) : 66.2 Heparin Dosing Weight: actual body weight   Vital Signs: Temp: 98.1 F (36.7 C) (06/24 0534) Temp Source: Oral (06/24 0534) BP: 111/75 (06/24 0534) Pulse Rate: 46 (06/24 0534)  Labs: Recent Labs    12/31/18 0319 01/01/19 0402 01/02/19 0307  HGB 7.7* 7.5* 7.8*  HCT 26.3* 25.4* 26.7*  PLT 277 299 352  HEPARINUNFRC 0.59 0.75* 0.40  CREATININE 0.71 0.98 0.78   Estimated Creatinine Clearance: 105.3 mL/min (by C-G formula based on SCr of 0.78 mg/dL).  Medical History: Past Medical History:  Diagnosis Date  . Ankle sprain   . Anxiety   . Colitis   . Depression    denies hospitalization or medication  . DVT (deep venous thrombosis) ([place]) 10/31/2018  . DVT (deep venous thrombosis) ([place])   . Hyponatremia   . Universal ulcerative colitis with rectal bleeding ([place]) 10/31/2018   Medications:  PTA Apixaban for DVT, dose 54m PO BID-LD 12/27/2018 at 10a  Assessment: Monica Ortega with PMH of DVT in April 2020 s/p IVC filter on Apixaban PTA admitted for acute severe ulcerative colitis flare. Worsening bloody diarrhea on admission. Per GI notes 6/20, now with less rectal bleeding. [facility]x of [facility]TE.   Today, 01/02/2019 Heparin level 0.40 units/ml, in desired range Hgb decreased to 7.8, Pltc WNL Still with slight blood in stools. No infusion issues per nursing Advanced to soft diet  Goal of Therapy:  Heparin level 0.3-0.7 units/ml  Monitor platelets by anticoagulation protocol: Yes   Plan:  Discontinue Heparin, resumed Apixaban 520mbid Begin Apixaban at time of Heparin discontinue  GrMinda DittoharmD Clinical Pharmacist [phone]/21/2020 9:07 AM

## 2019-01-02 NOTE — Progress Notes (Addendum)
Daily Rounding Note  01/02/2019, 11:10 AM  LOS: 6 days   SUBJECTIVE:   Chief complaint:    Severe ulcerative colitis.  OBJECTIVE:         Vital signs in last 24 hours:    Temp:  [97.7 F (36.5 C)-98.1 F (36.7 C)] 98.1 F (36.7 C) (06/24 0534) Pulse Rate:  [46-56] 46 (06/24 0534) Resp:  [16-18] 16 (06/24 0534) BP: (111-116)/(71-78) 111/75 (06/24 0534) SpO2:  [98 %-100 %] 98 % (06/24 0534) Last BM Date: 01/01/19 Filed Weights   12/27/18 1233 12/27/18 2215  Weight: 63.5 kg 64.9 kg   General: pale, comfortable, alert   Heart: RRR Chest: clear bil.   Abdomen: soft, minor tenderness.  Active BS.  ND  Extremities: no CCE Neuro/Psych:  Fully alert, oriented.  Good, detailed historian.    Intake/Output from previous day: 06/23 0701 - 06/24 0700 In: 960 [P.O.:960] Out: -   Intake/Output this shift: No intake/output data recorded.  Lab Results: Recent Labs    12/31/18 0319 01/01/19 0402 01/02/19 0307  WBC 2.9* 3.9* 5.6  HGB 7.7* 7.5* 7.8*  HCT 26.3* 25.4* 26.7*  PLT 277 299 352   BMET Recent Labs    12/31/18 0319 01/01/19 0402 01/02/19 0307  NA 140 137 139  K 3.6 3.8 4.1  CL 106 103 104  CO2 27 28 29   GLUCOSE 232* 240* 279*  BUN <5* 5* 6  CREATININE 0.71 0.98 0.78  CALCIUM 8.0* 7.8* 8.1*   Scheduled Meds: . dicyclomine  10 mg Oral TID AC & HS  . feeding supplement (ENSURE ENLIVE)  237 mL Oral BID BM  . insulin aspart  0-9 Units Subcutaneous TID WC  . pantoprazole  40 mg Oral Daily  . predniSONE  40 mg Oral BID WC   Continuous Infusions: . dextrose 5 % and 0.45 % NaCl with KCl 10 mEq/L 100 mL/hr at 01/02/19 0354  . heparin 800 Units/hr (01/01/19 0858)  . ondansetron (ZOFRAN) IV     PRN Meds:.acetaminophen, alum & mag hydroxide-simeth, loperamide, morphine injection, ondansetron **OR** ondansetron (ZOFRAN) IV, oxyCODONE   ASSESMENT:   *   Severe ulcerative colitis.  High-dose Remicade  infused 6/21, her fourth dose after initial Remicade late 10/2018. Completed 5 days Zosyn.  Completed 6 days IV Solu-Medrol.  Day 1 prednisone 40 BID  *   Iron deficiency anemia.  Feraheme on 4/23 and 4/25, Ferric gluconate (Nulecit) on 6/21.      *    DVT, DX 10/2018.  Eliquis at home.  Currently on IV heparin  *   Hyperglycemia.  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  Hemoglobin A1c elevated at 6.4.  On SSI.    *    Microcytic anemia.  Hemoglobins stable.  *    Leukopenia, ? related to Remicade? Improved.     PLAN   *  Reduce Prednisone to 60 mg day tomorrow, try to get dose to 40 mg/day given DM.     *   Consider starting Imuran but Dr Lyndel Safe to d/w Dr Henrene Pastor first.     Azucena Freed  01/02/2019, 11:10 AM Phone [phone]    Attending physician's note   I have taken an interval history, reviewed the chart and examined the patient. I agree with the Advanced Practitioner's note, impression and recommendations.   Severe refractory pan-ulcerative colitis s/p Remicade 77m/kg 12/30/2018, IV solumedrol 670mQ12. Normal TPMT. IDA s/p Feraheme x 2 DVT s/p  IVC filter, on heparin (eliquis at home) [facility]/hyperglycemia- S/E of steroids.  Doing better today.  Able to tolerate p.o. OK with Dr Henrene Pastor to start Imuran.  Plan: -Reduce prednisone to 60 mg p.o. QD in AM, then 40 mg p.o. QD in 7 days.  Continue 40 mg p.o. once a day until follow-up visit.  Very gradual taper to reduce risk of another exacerbation. -Start Imuran 25 mg p.o. QD.  Can increase to 50 mg p.o. QD in 1 week. -If feels better, anticipate discharge in a.m. with close follow-up. -Need blood tests CBC, CMP and amylase in 1 week, prior to appointment with Dr. Henrene Pastor. -Rpt 60m/kg Remicade in 4 weeks. If responds, then may consider 575mkg Q8weekly for maintenance. -May need appointment with endocrine as an outpatient    RaCarmell AustriaMD [phone]

## 2019-01-02 NOTE — Progress Notes (Signed)
PROGRESS NOTE  Monica Ortega VKP:[id number] DOB: 1990-09-07 DOA: 12/27/2018 PCP: [name], DO   LOS: 6 days   Patient is from: Home  Brief Narrative / Interim history: 28 year old with history of ulcerative colitis, recent DVT with IVC filter in place and on Eliquis, depression/anxiety came to the ER with complaints of fevers, abdominal pain and diarrhea.  C. difficile study was negative.  Admitted for ulcerative colitis flare started on IV steroids.  Also due to fever she was started on Zosyn.  Received Remicade on 6/21.  Received iron infusion on 6/21.  Completed 5 days course of IV Zosyn.  Transitioned to prednisone taper per GI on 6/23.  Assessment & Plan: Abdominal pain/diarrhea due to severe ulcerative colitis flareup: Improving. - GI managing. -C. difficile, GI panel and blood cultures negative. -Status post Remicade on 6/21. -IV Solu-Medrol 6/19-6/23 -Prednisone taper started per GI -IV Zosyn 6/19-6/23 -Symptoms improving. -Advancing diet  Bradycardia: At rate in mid 40s overnight and this morning.  Not symptomatic.  Ambulated in the hallway without problem.  Not on nodal blocking agent.  No recent EKG in the chart. -We will obtain 12-lead EKG as a reference.  Hyperglycemia/prediabetes: Likely due to steroid.  History of chronic steroid use for UC.  A1c 6.4%. -Start sliding scale insulin -We will discuss p.o. options going forward.  Iron deficiency anemia: Anemia panel consistent with iron deficiency. -Received iron infusion on 6/21 -Feraheme infusion today.  History of DVT and PE -Resumed Eliquis  Scheduled Meds: . apixaban  5 mg Oral BID  . dicyclomine  10 mg Oral TID AC & HS  . feeding supplement (ENSURE ENLIVE)  237 mL Oral BID BM  . insulin aspart  0-9 Units Subcutaneous TID WC  . pantoprazole  40 mg Oral Daily  . predniSONE  40 mg Oral BID WC   Continuous Infusions: . ondansetron (ZOFRAN) IV     PRN Meds:.acetaminophen, alum & mag  hydroxide-simeth, loperamide, morphine injection, ondansetron **OR** ondansetron (ZOFRAN) IV, oxyCODONE   DVT prophylaxis: On heparin drip Code Status: Full code Family Communication: Patient update family let me know if any question Disposition Plan: Anticipate discharge in the next 24-hour if she remains a stable and cleared by GI.  Subjective: No major events overnight of this morning.  Had couple of bowel movements overnight.  Stools more formed.  He noted some tinge of blood.  Pain improved as well.  No nausea or vomiting.  No other complaints.   Objective: Vitals:   01/01/19 0453 01/01/19 1403 01/01/19 2050 01/02/19 0534  BP: 109/69 112/71 116/78 111/75  Pulse: 69 (!) 56 (!) 48 (!) 46  Resp: 17 16 18 16   Temp: 97.9 F (36.6 C) 98 F (36.7 C) 97.7 F (36.5 C) 98.1 F (36.7 C)  TempSrc: Oral Oral Oral Oral  SpO2: 99% 100% 99% 98%  Weight:      Height:        Intake/Output Summary (Last 24 hours) at 01/02/2019 1305 Last data filed at 01/02/2019 0535 Gross per 24 hour  Intake 840 ml  Output -  Net 840 ml   Filed Weights   12/27/18 1233 12/27/18 2215  Weight: 63.5 kg 64.9 kg    Examination:  GENERAL: No acute distress.  Appears well.  HEENT: MMM.  Vision and hearing grossly intact.  NECK: Supple.  No JVD.  LUNGS:  No IWOB. Good air movement bilaterally. HEART: Regular rhythm.  Heart rate 52. Heart sounds normal.  ABD: Bowel sounds present. Soft.  No significant tenderness to palpation. MSK/EXT:  Moves all extremities. No apparent deformity. No edema bilaterally.  SKIN: no apparent skin lesion or wound NEURO: Awake, alert and oriented appropriately.  No gross deficit.  PSYCH: Calm. Normal affect.  Consultants:   GI  Procedures:   None  Microbiology: . COVID-19 negative . Blood cultures negative . C. difficile negative . GI panel negative  Antimicrobials: Anti-infectives (From admission, onward)   Start     Dose/Rate Route Frequency Ordered Stop    12/28/18 1600  piperacillin-tazobactam (ZOSYN) IVPB 3.375 g  Status:  Discontinued     3.375 g 12.5 mL/hr over 240 Minutes Intravenous Every 8 hours 12/28/18 1543 01/01/19 0942      Data Reviewed: I have independently reviewed following labs and imaging studies  CBC: Recent Labs  Lab 12/27/18 1246  12/29/18 0247 12/30/18 0325 12/31/18 0319 01/01/19 0402 01/02/19 0307  WBC 12.9*   < > 6.4 5.6 2.9* 3.9* 5.6  NEUTROABS 9.3*  --   --   --   --   --   --   HGB 10.7*   < > 8.5* 7.8* 7.7* 7.5* 7.8*  HCT 35.6*   < > 28.6* 26.6* 26.3* 25.4* 26.7*  MCV 79.1*   < > 79.0* 79.6* 79.9* 79.1* 79.2*  PLT 354   < > 281 274 277 299 352   < > = values in this interval not displayed.   Basic Metabolic Panel: Recent Labs  Lab 12/29/18 0247 12/30/18 0325 12/31/18 0319 01/01/19 0402 01/02/19 0307  NA 137 138 140 137 139  K 3.6 3.6 3.6 3.8 4.1  CL 105 105 106 103 104  CO2 22 25 27 28 29   GLUCOSE 182* 214* 232* 240* 279*  BUN <5* <5* <5* 5* 6  CREATININE 0.87 0.66 0.71 0.98 0.78  CALCIUM 8.2* 8.0* 8.0* 7.8* 8.1*  MG  --  2.3 2.4 2.2 2.2   GFR: Estimated Creatinine Clearance: 105.3 mL/min (by C-G formula based on SCr of 0.78 mg/dL). Liver Function Tests: Recent Labs  Lab 12/27/18 1246 12/28/18 0312  AST 14* 10*  ALT 14 12  ALKPHOS 50 44  BILITOT 0.9 0.5  PROT 5.6* 5.1*  ALBUMIN 2.6* 2.2*   Recent Labs  Lab 12/27/18 1246  LIPASE 30   No results for input(s): AMMONIA in the last 168 hours. Coagulation Profile: Recent Labs  Lab 12/29/18 1300  INR 1.3*   Cardiac Enzymes: No results for input(s): CKTOTAL, CKMB, CKMBINDEX, TROPONINI in the last 168 hours. BNP (last 3 results) No results for input(s): PROBNP in the last 8760 hours. HbA1C: Recent Labs    01/02/19 0354  HGBA1C 6.4*   CBG: Recent Labs  Lab 01/02/19 0757 01/02/19 1213  GLUCAP 224* 220*   Lipid Profile: No results for input(s): CHOL, HDL, LDLCALC, TRIG, CHOLHDL, LDLDIRECT in the last 72  hours. Thyroid Function Tests: No results for input(s): TSH, T4TOTAL, FREET4, T3FREE, THYROIDAB in the last 72 hours. Anemia Panel: No results for input(s): VITAMINB12, FOLATE, FERRITIN, TIBC, IRON, RETICCTPCT in the last 72 hours. Urine analysis:    Component Value Date/Time   COLORURINE YELLOW 12/27/2018 1445   APPEARANCEUR CLEAR 12/27/2018 1445   LABSPEC 1.004 (L) 12/27/2018 1445   PHURINE 5.0 12/27/2018 1445   GLUCOSEU NEGATIVE 12/27/2018 1445   HGBUR SMALL (A) 12/27/2018 1445   BILIRUBINUR NEGATIVE 12/27/2018 1445   KETONESUR NEGATIVE 12/27/2018 1445   PROTEINUR NEGATIVE 12/27/2018 1445   NITRITE NEGATIVE 12/27/2018 1445   LEUKOCYTESUR NEGATIVE  12/27/2018 1445   Sepsis Labs: Invalid input(s): PROCALCITONIN, LACTICIDVEN  Recent Results (from the past 240 hour(s))  Gastrointestinal Panel by PCR , Stool     Status: None   Collection Time: 12/27/18  5:27 PM   Specimen: Stool  Result Value Ref Range Status   Campylobacter species NOT DETECTED NOT DETECTED Final   Plesimonas shigelloides NOT DETECTED NOT DETECTED Final   Salmonella species NOT DETECTED NOT DETECTED Final   Yersinia enterocolitica NOT DETECTED NOT DETECTED Final   Vibrio species NOT DETECTED NOT DETECTED Final   Vibrio cholerae NOT DETECTED NOT DETECTED Final   Enteroaggregative E coli (EAEC) NOT DETECTED NOT DETECTED Final   Enteropathogenic E coli (EPEC) NOT DETECTED NOT DETECTED Final   Enterotoxigenic E coli (ETEC) NOT DETECTED NOT DETECTED Final   Shiga like toxin producing E coli (STEC) NOT DETECTED NOT DETECTED Final   Shigella/Enteroinvasive E coli (EIEC) NOT DETECTED NOT DETECTED Final   Cryptosporidium NOT DETECTED NOT DETECTED Final   Cyclospora cayetanensis NOT DETECTED NOT DETECTED Final   Entamoeba histolytica NOT DETECTED NOT DETECTED Final   Giardia lamblia NOT DETECTED NOT DETECTED Final   Adenovirus F40/41 NOT DETECTED NOT DETECTED Final   Astrovirus NOT DETECTED NOT DETECTED Final    Norovirus GI/GII NOT DETECTED NOT DETECTED Final   Rotavirus A NOT DETECTED NOT DETECTED Final   Sapovirus (I, II, IV, and V) NOT DETECTED NOT DETECTED Final    Comment: Performed at [facility], [address] [place], [place] [address]  C Difficile Quick Screen w PCR reflex     Status: None   Collection Time: 12/27/18  5:27 PM   Specimen: Stool  Result Value Ref Range Status   C Diff antigen NEGATIVE NEGATIVE Final   C Diff toxin NEGATIVE NEGATIVE Final   C Diff interpretation No C. difficile detected.  Final    Comment: Performed at [facility], [place] [address], [place], [place] [address]  Novel Coronavirus,NAA,(SEND-OUT TO REF LAB - TAT 24-48 hrs); Hosp Order     Status: None   Collection Time: 12/27/18  8:12 PM   Specimen: Nasopharyngeal Swab; Respiratory  Result Value Ref Range Status   SARS-CoV-2, NAA NOT DETECTED NOT DETECTED Final    Comment: (NOTE) This test was developed and its performance characteristics determined by [employer]. This test has not been FDA cleared or approved. This test has been authorized by FDA under an Emergency Use Authorization (EUA). This test is only authorized for the duration of time the declaration that circumstances exist justifying the authorization of the emergency use of in vitro diagnostic tests for detection of SARS-CoV-2 virus and/or diagnosis of COVID-19 infection under section 564(b)(1) of the Act, 21 U.S.C. 341PFX-9(K)(2), unless the authorization is terminated or revoked sooner. When diagnostic testing is negative, the possibility of a false negative result should be considered in the context of a patient's recent exposures and the presence of clinical signs and symptoms consistent with COVID-19. An individual without symptoms of COVID-19 and who is not shedding SARS-CoV-2 virus would expect to have a negative (not detected) result in this assay. Performed  At: [facility] [address]  [place], [place] [id number] Rush Farmer MD JM:[id number]    [place]  Final    Comment: Performed at [facility], [place] [address], [place], [place] [address]  Culture, blood (routine x 2)     Status: None   Collection Time: 12/28/18  4:00 PM  Specimen: BLOOD  Result Value Ref Range Status   Specimen Description BLOOD RIGHT ANTECUBITAL  Final   Special Requests   Final    BOTTLES DRAWN AEROBIC ONLY Blood Culture adequate volume   Culture   Final    NO GROWTH 5 DAYS Performed at [facility], [address], [place], [place] [address]    Report Status 01/02/2019 FINAL  Final  Culture, blood (routine x 2)     Status: None   Collection Time: 12/28/18  4:09 PM   Specimen: BLOOD RIGHT HAND  Result Value Ref Range Status   Specimen Description BLOOD RIGHT HAND  Final   Special Requests   Final    BOTTLES DRAWN AEROBIC ONLY Blood Culture results may not be optimal due to an inadequate volume of blood received in culture bottles   Culture   Final    NO GROWTH 5 DAYS Performed at [facility], [place] [address] [place], [place] [address]    Report Status 01/02/2019 FINAL  Final     Radiology Studies: No results found.   Taye T. Pitman  If 7PM-7AM, please contact night-coverage [email or web address] Password [email or web address] 01/02/2019, 1:05 PM

## 2019-01-03 ENCOUNTER — Other Ambulatory Visit: Payer: Self-pay | Admitting: Physician Assistant

## 2019-01-03 DIAGNOSIS — D500 Iron deficiency anemia secondary to blood loss (chronic): Secondary | ICD-10-CM

## 2019-01-03 DIAGNOSIS — K51011 Ulcerative (chronic) pancolitis with rectal bleeding: Secondary | ICD-10-CM

## 2019-01-03 DIAGNOSIS — T380X5A Adverse effect of glucocorticoids and synthetic analogues, initial encounter: Secondary | ICD-10-CM

## 2019-01-03 LAB — BASIC METABOLIC PANEL
Anion gap: 10 (ref 5–15)
BUN: 11 mg/dL (ref 6–20)
CO2: 26 mmol/L (ref 22–32)
Calcium: 8.6 mg/dL — ABNORMAL LOW (ref 8.9–10.3)
Chloride: 100 mmol/L (ref 98–111)
Creatinine, Ser: 0.7 mg/dL (ref 0.44–1.00)
GFR calc Af Amer: >60 mL/min (ref >60)
GFR calc non Af Amer: >60 mL/min (ref >60)
Glucose, Bld: 241 mg/dL — ABNORMAL HIGH (ref 70–99)
Potassium: 4.3 mmol/L (ref 3.5–5.1)
Sodium: 136 mmol/L (ref 135–145)

## 2019-01-03 LAB — GLUCOSE, CAPILLARY: Glucose-Capillary: 194 mg/dL — ABNORMAL HIGH (ref 70–99)

## 2019-01-03 LAB — CBC
HCT: 28.6 % — ABNORMAL LOW (ref 36.0–46.0)
Hemoglobin: 8.6 g/dL — ABNORMAL LOW (ref 12.0–15.0)
MCH: 23.8 pg — ABNORMAL LOW (ref 26.0–34.0)
MCHC: 30.1 g/dL (ref 30.0–36.0)
MCV: 79.2 fL — ABNORMAL LOW (ref 80.0–100.0)
Platelets: 347 10*3/uL (ref 150–400)
RBC: 3.61 MIL/uL — ABNORMAL LOW (ref 3.87–5.11)
RDW: 15.2 % (ref 11.5–15.5)
WBC: 6.4 10*3/uL (ref 4.0–10.5)
nRBC: 0.6 % — ABNORMAL HIGH (ref 0.0–0.2)

## 2019-01-03 LAB — MAGNESIUM: Magnesium: 2.3 mg/dL (ref 1.7–2.4)

## 2019-01-03 MED ORDER — PREDNISONE 20 MG PO TABS: ORAL_TABLET | ORAL | 0 refills | Status: DC

## 2019-01-03 MED ORDER — PANTOPRAZOLE SODIUM 40 MG PO TBEC: 40.0000 mg | DELAYED_RELEASE_TABLET | Freq: Every day | ORAL | 0 refills | Status: DC

## 2019-01-03 MED ORDER — JARDIANCE 10 MG PO TABS: 10.0000 mg | ORAL_TABLET | Freq: Every day | ORAL | 1 refills | Status: DC

## 2019-01-03 MED ORDER — ONDANSETRON HCL 8 MG PO TABS: 8.0000 mg | ORAL_TABLET | Freq: Three times a day (TID) | ORAL | 0 refills | Status: DC | PRN

## 2019-01-03 MED ORDER — AZATHIOPRINE 50 MG PO TABS: ORAL_TABLET | ORAL | 0 refills | Status: DC

## 2019-01-03 MED ORDER — PREDNISONE 20 MG PO TABS: ORAL_TABLET | ORAL | 0 refills | Status: AC

## 2019-01-03 MED ORDER — DICYCLOMINE HCL 10 MG PO CAPS: 10.0000 mg | ORAL_CAPSULE | Freq: Three times a day (TID) | ORAL | 1 refills | Status: DC

## 2019-01-03 MED ORDER — AZATHIOPRINE 50 MG PO TABS: ORAL_TABLET | ORAL | 0 refills | Status: AC

## 2019-01-03 NOTE — Discharge Summary (Signed)
Physician Discharge Summary  Monica Ortega DJM:[id number] DOB: [birth year] DOA: 12/27/2018  PCP: [name], DO  Admit date: 12/27/2018 Discharge date: 01/03/2019  Admitted From: Home Disposition: Home  Recommendations for Outpatient Follow-up:  1. Follow up with PCP in 1 week 2. Please obtain CBC/CMP/amylase at follow up 3. Follow-up with gastroenterology as scheduled 4. Recommend referral to endocrinology for prediabetes/hyperglycemia and steroid 5. Please follow up on the following pending results: None  Home Health: None Equipment/Devices: None  Discharge Condition: Stable CODE STATUS: Full code  Hospital Course: 28 year old with history of ulcerative colitis, recent DVT with IVC filter in place and on Eliquis, depression/anxiety came to the ER with complaints of fevers, abdominal pain and diarrhea. C. difficile study and GI panel were negative. Admitted for ulcerative colitis flare started on IV steroids. Also due to fever she was started on Zosyn.  Received Remicade on 6/21. Received iron infusion on 6/21 and 6/24.  Completed 5 days course of IV Zosyn.  Transitioned to prednisone taper per GI on 6/23.  Also started on Imuran.  GI symptoms improved significantly.  Tolerated solid food.  Discharged on prednisone taper and Imuran.  See individual problem list below for more.  Discharge Diagnoses:  Abdominal pain/diarrhea due to severe ulcerative colitis flareup: Symptoms improved significantly. -C. difficile, GI panel and blood cultures negative. -Status post Remicade on 6/21. -IV Zosyn 6/19-6/23 -IV Solu-Medrol 6/19-6/23 -GI recommendations: -Prednisone taper and Imuran as below -Repeat 10 mg/kg Remicade in 4 weeks.  If responds, then may consider 5 mg/kg every 8 weekly for maintenance -Follow-up with Dr. Henrene Pastor (GI) on 01/18/2019 -Check CBC, CMP and amylase about a week before her appointment with GI.  Bradycardia: At rate in mid 40s overnight and this morning.  Not  symptomatic.  Ambulated in the hallway without problem.  Not on nodal blocking agent.  EKG without AV block.  Hyperglycemia/prediabetes: Likely due to steroid.  History of chronic steroid use for UC.  A1c 6.4%. Discussed medication options and agreed on Jardiance.  Discussed risks and benefits including but not limited to vulvovaginitis, rare fournier's gangrene and DKA.  Discharged on Jardiance 10 mg daily.  Iron deficiency anemia: Anemia panel consistent with iron deficiency.  Improved. -Received iron infusion on 6/21 and 6/24.   History of DVT and PE -Discharged on Eliquis.  GI recommendations as of 01/02/19:  -Reduce prednisone to 60 mg p.o. QD in AM, then 40 mg p.o. QD in 7 days.  Continue 40 mg p.o. once a day until follow-up visit.  Very gradual taper to reduce risk of another exacerbation. -Start Imuran 25 mg p.o. QD.  Can increase to 50 mg p.o. QD in 1 week. -If feels better, anticipate discharge in a.m. with close follow-up. -Need blood tests CBC, CMP and amylase in 1 week, prior to appointment with Dr. Henrene Pastor. -Rpt 32m/kgRemicade in 4 weeks. If responds, then may consider 553mkg Q8weekly for maintenance. -May need appointment with endocrine as an outpatient    Discharge Instructions  Discharge Instructions    Call MD for:  extreme fatigue   Complete by: As directed    Call MD for:  persistant dizziness or light-headedness   Complete by: As directed    Call MD for:  persistant nausea and vomiting   Complete by: As directed    Call MD for:  severe uncontrolled pain   Complete by: As directed    Call MD for:  temperature >100.4   Complete by: As directed    Diet  Carb Modified   Complete by: As directed    Discharge instructions   Complete by: As directed    It has been a pleasure taking care of you! You were admitted with ulcerative colitis flareup.  You were treated with this with antibiotics, Remicade and steroid.  His symptoms improved.  We are discharging you on  more steroid and Imuran that you need to continue taking until you see your gastroenterologist.  Your blood glucose has been elevated.  This is likely due to steroid.  We started you on medication.  We strongly recommend you follow-up with endocrinologist outpatient.  Please follow-up with your primary care doctor in 1 week for blood work.   Go to your appointment with gastroenterologist as scheduled.  Please review your new medication list and directions before you take your medications.  Take care, Dr. Cyndia Skeeters   Increase activity slowly   Complete by: As directed      Allergies as of 01/03/2019   No Known Allergies     Medication List    STOP taking these medications   mesalamine 1.2 g EC tablet Commonly known as: LIALDA   omeprazole 20 MG capsule Commonly known as: PRILOSEC Replaced by: pantoprazole 40 MG tablet     TAKE these medications   Acetaminophen Extra Strength 500 MG tablet Generic drug: acetaminophen Take 1,000 mg by mouth every 6 (six) hours as needed for headache (pain).   apixaban 5 MG Tabs tablet Commonly known as: Eliquis 2 tabs (13m) PO BID x 7 days, then 1 tab PO BID What changed:   how much to take  how to take this  when to take this  additional instructions   azaTHIOprine 50 MG tablet Commonly known as: Imuran Take 0.5 tablets (25 mg total) by mouth daily for 7 days, THEN 1 tablet (50 mg total) daily. Start taking on: January 03, 2019   dicyclomine 10 MG capsule Commonly known as: BENTYL Take 1 capsule (10 mg total) by mouth 4 (four) times daily -  before meals and at bedtime. What changed:   how much to take  when to take this   feeding supplement (ENSURE ENLIVE) Liqd Take 237 mLs by mouth 2 (two) times daily between meals. What changed: when to take this   GAS-X PO Take 1 capsule by mouth daily as needed (flatulence).   Jardiance 10 MG Tabs tablet Generic drug: empagliflozin Take 10 mg by mouth daily.   loperamide 2 MG  capsule Commonly known as: IMODIUM Take 2-4 mg by mouth 2 (two) times daily as needed for diarrhea or loose stools.   ondansetron 4 MG tablet Commonly known as: ZOFRAN Take 1 tablet (4 mg total) by mouth every 6 (six) hours. What changed: Another medication with the same name was added. Make sure you understand how and when to take each.   ondansetron 8 MG tablet Commonly known as: ZOFRAN Take 1 tablet (8 mg total) by mouth every 8 (eight) hours as needed for nausea. What changed: You were already taking a medication with the same name, and this prescription was added. Make sure you understand how and when to take each.   pantoprazole 40 MG tablet Commonly known as: PROTONIX Take 1 tablet (40 mg total) by mouth daily. Start taking on: January 04, 2019 Replaces: omeprazole 20 MG capsule   predniSONE 20 MG tablet Commonly known as: DELTASONE Take 3 tablets (60 mg total) by mouth daily with breakfast for 7 days, THEN 2 tablets (40 mg  total) daily with breakfast for 23 days. Start taking on: January 03, 2019 What changed: See the new instructions.   promethazine 25 MG tablet Commonly known as: PHENERGAN Take 1 tablet (25 mg total) by mouth every 6 (six) hours as needed for nausea or vomiting.   VISINE OP Apply 1 drop to eye daily as needed (dry eyes).      Follow-up Information    Irene Shipper, MD Follow up on 01/18/2019.   Specialty: Gastroenterology Why: 1:30 Phone visit with Dr Henrene Pastor for colitis fup Contact information: [address] [address] [phone]        [name], DO. Schedule an appointment as soon as possible for a visit in 1 week(s).   Specialty: Family Medicine Contact information: [place] [address] [phone]           Consultations:  Gastroenterology  Procedures/Studies:  2D Echo: None  Ct Abdomen Pelvis W Contrast  Result Date: 12/27/2018 CLINICAL DATA:  Abdomen pain EXAM: CT ABDOMEN AND  PELVIS WITH CONTRAST TECHNIQUE: Multidetector CT imaging of the abdomen and pelvis was performed using the standard protocol following bolus administration of intravenous contrast. CONTRAST:  19m OMNIPAQUE IOHEXOL 300 MG/ML  SOLN COMPARISON:  CT 10/31/2018, 09/29/2018 FINDINGS: Lower chest: No acute abnormality. Hepatobiliary: No focal liver abnormality is seen. No gallstones, gallbladder wall thickening, or biliary dilatation. Pancreas: Unremarkable. No pancreatic ductal dilatation or surrounding inflammatory changes. Spleen: Normal in size without focal abnormality. Adrenals/Urinary Tract: Adrenal glands are unremarkable. Kidneys are normal, without renal calculi, focal lesion, or hydronephrosis. Bladder is unremarkable. Stomach/Bowel: Stomach is within normal limits. No dilated small bowel. Featureless colon with wall thickening and mild surrounding inflammatory change consistent with pancolitis. Vascular/Lymphatic: Nonaneurysmal aorta. Interval placement of IVC filter at L2-L3 level. Chronic DVT in the left common iliac vein. Reproductive: Uterus and bilateral adnexa are unremarkable. Other: Small free fluid in the pelvis.  No free air Musculoskeletal: No acute or significant osseous findings. IMPRESSION: 1. Pancolitis, consistent with history of ulcerative colitis. Negative for obstruction or perforation. 2. Small free fluid in the pelvis 3. Interim placement of IVC filter. Chronic DVT in the left common iliac vein. Electronically Signed   By: KDonavan FoilM.D.   On: 12/27/2018 19:35   Dg Abd Portable 1v  Result Date: 12/28/2018 CLINICAL DATA:  Abdominal pain EXAM: PORTABLE ABDOMEN - 1 VIEW COMPARISON:  CT 12/27/2018 FINDINGS: IVC filter to the right of L2-L3. Mild air distension and featureless appearance of the colon, consistent with colitis. Nonobstructed gas pattern. No obvious intramural air. IMPRESSION: Nonobstructed gas pattern. Featureless appearance of the colon, compatible with history of  colitis. No intramural air. Electronically Signed   By: KDonavan FoilM.D.   On: 12/28/2018 16:29      Subjective: No major events overnight of this morning.  No complaints.  Denies chest pain, dyspnea, nausea, vomiting, abdominal pain or diarrhea.  Has had normal bowel movements overnight.  No hematochezia or melena.  Tolerating diet.  Feels ready to go home.   Discharge Exam: Vitals:   01/02/19 2120 01/03/19 0515  BP: 115/75 115/72  Pulse: (!) 55 (!) 46  Resp: 17 16  Temp: 98.5 F (36.9 C) 97.8 F (36.6 C)  SpO2: 99% 99%    GENERAL: No acute distress.  Appears well.  HEENT: MMM.  Vision and hearing grossly intact.  Moon face? NECK: Supple.  No JVD.  LUNGS:  No IWOB. Good air movement bilaterally. HEART:  Bradycardic to 50.  Regular rhythm.. Heart sounds normal.  ABD: Bowel sounds present. Soft. Non tender.  MSK/EXT:  Moves all extremities. No apparent deformity. No edema bilaterally. SKIN: no apparent skin lesion or wound NEURO: Awake, alert and oriented appropriately.  No gross deficit.  PSYCH: Calm. Normal affect.     The results of significant diagnostics from this hospitalization (including imaging, microbiology, ancillary and laboratory) are listed below for reference.     Microbiology: Recent Results (from the past 240 hour(s))  Gastrointestinal Panel by PCR , Stool     Status: None   Collection Time: 12/27/18  5:27 PM   Specimen: Stool  Result Value Ref Range Status   Campylobacter species NOT DETECTED NOT DETECTED Final   Plesimonas shigelloides NOT DETECTED NOT DETECTED Final   Salmonella species NOT DETECTED NOT DETECTED Final   Yersinia enterocolitica NOT DETECTED NOT DETECTED Final   Vibrio species NOT DETECTED NOT DETECTED Final   Vibrio cholerae NOT DETECTED NOT DETECTED Final   Enteroaggregative E coli (EAEC) NOT DETECTED NOT DETECTED Final   Enteropathogenic E coli (EPEC) NOT DETECTED NOT DETECTED Final   Enterotoxigenic E coli (ETEC) NOT DETECTED  NOT DETECTED Final   Shiga like toxin producing E coli (STEC) NOT DETECTED NOT DETECTED Final   Shigella/Enteroinvasive E coli (EIEC) NOT DETECTED NOT DETECTED Final   Cryptosporidium NOT DETECTED NOT DETECTED Final   Cyclospora cayetanensis NOT DETECTED NOT DETECTED Final   Entamoeba histolytica NOT DETECTED NOT DETECTED Final   Giardia lamblia NOT DETECTED NOT DETECTED Final   Adenovirus F40/41 NOT DETECTED NOT DETECTED Final   Astrovirus NOT DETECTED NOT DETECTED Final   Norovirus GI/GII NOT DETECTED NOT DETECTED Final   Rotavirus A NOT DETECTED NOT DETECTED Final   Sapovirus (I, II, IV, and V) NOT DETECTED NOT DETECTED Final    Comment: Performed at [facility], [address] [place], [place] [address]  C Difficile Quick Screen w PCR reflex     Status: None   Collection Time: 12/27/18  5:27 PM   Specimen: Stool  Result Value Ref Range Status   C Diff antigen NEGATIVE NEGATIVE Final   C Diff toxin NEGATIVE NEGATIVE Final   C Diff interpretation No C. difficile detected.  Final    Comment: Performed at [facility], [place] [address] [place], [place] [address]  Novel Coronavirus,NAA,(SEND-OUT TO REF LAB - TAT 24-48 hrs); Hosp Order     Status: None   Collection Time: 12/27/18  8:12 PM   Specimen: Nasopharyngeal Swab; Respiratory  Result Value Ref Range Status   SARS-CoV-2, NAA NOT DETECTED NOT DETECTED Final    Comment: (NOTE) This test was developed and its performance characteristics determined by [employer]. This test has not been FDA cleared or approved. This test has been authorized by FDA under an Emergency Use Authorization (EUA). This test is only authorized for the duration of time the declaration that circumstances exist justifying the authorization of the emergency use of in vitro diagnostic tests for detection of SARS-CoV-2 virus and/or diagnosis of COVID-19 infection under section 564(b)(1) of the Act, 21 U.S.C. 151VOH-6(W)(7), unless the  authorization is terminated or revoked sooner. When diagnostic testing is negative, the possibility of a false negative result should be considered in the context of a patient's recent exposures and the presence of clinical signs and symptoms consistent with COVID-19. An individual without symptoms of COVID-19 and who is not shedding SARS-CoV-2 virus would expect to have a negative (not  detected) result in this assay. Performed  At: [facility] [address] [place], [place] [id number] Rush Farmer MD YQ:[id number]    [place]  Final    Comment: Performed at [facility], [place] [address] [place], [place] [address]  Culture, blood (routine x 2)     Status: None   Collection Time: 12/28/18  4:00 PM   Specimen: BLOOD  Result Value Ref Range Status   Specimen Description BLOOD RIGHT ANTECUBITAL  Final   Special Requests   Final    BOTTLES DRAWN AEROBIC ONLY Blood Culture adequate volume   Culture   Final    NO GROWTH 5 DAYS Performed at [facility], [place] [address] [place], [place] [address]    Report Status 01/02/2019 FINAL  Final  Culture, blood (routine x 2)     Status: None   Collection Time: 12/28/18  4:09 PM   Specimen: BLOOD RIGHT HAND  Result Value Ref Range Status   Specimen Description BLOOD RIGHT HAND  Final   Special Requests   Final    BOTTLES DRAWN AEROBIC ONLY Blood Culture results may not be optimal due to an inadequate volume of blood received in culture bottles   Culture   Final    NO GROWTH 5 DAYS Performed at [facility], [place] [address], [place], [place] [address]    Report Status 01/02/2019 FINAL  Final     Labs: BNP (last 3 results) No results for input(s): BNP in the last 8760 hours. Basic Metabolic Panel: Recent Labs  Lab 12/30/18 0325 12/31/18 0319 01/01/19 0402 01/02/19 0307 01/03/19 0319  NA 138 140 137 139 136  K 3.6 3.6 3.8 4.1 4.3  CL 105 106 103 104 100  CO2 25 27 28 29 26    GLUCOSE 214* 232* 240* 279* 241*  BUN <5* <5* 5* 6 11  CREATININE 0.66 0.71 0.98 0.78 0.70  CALCIUM 8.0* 8.0* 7.8* 8.1* 8.6*  MG 2.3 2.4 2.2 2.2 2.3   Liver Function Tests: Recent Labs  Lab 12/27/18 1246 12/28/18 0312  AST 14* 10*  ALT 14 12  ALKPHOS 50 44  BILITOT 0.9 0.5  PROT 5.6* 5.1*  ALBUMIN 2.6* 2.2*   Recent Labs  Lab 12/27/18 1246  LIPASE 30   No results for input(s): AMMONIA in the last 168 hours. CBC: Recent Labs  Lab 12/27/18 1246  12/30/18 0325 12/31/18 0319 01/01/19 0402 01/02/19 0307 01/03/19 0319  WBC 12.9*   < > 5.6 2.9* 3.9* 5.6 6.4  NEUTROABS 9.3*  --   --   --   --   --   --   HGB 10.7*   < > 7.8* 7.7* 7.5* 7.8* 8.6*  HCT 35.6*   < > 26.6* 26.3* 25.4* 26.7* 28.6*  MCV 79.1*   < > 79.6* 79.9* 79.1* 79.2* 79.2*  PLT 354   < > 274 277 299 352 347   < > = values in this interval not displayed.   Cardiac Enzymes: No results for input(s): CKTOTAL, CKMB, CKMBINDEX, TROPONINI in the last 168 hours. BNP: Invalid input(s): POCBNP CBG: Recent Labs  Lab 01/02/19 1213 01/02/19 1633 01/02/19 1729 01/02/19 2214 01/03/19 0841  GLUCAP 220* 274* 309* 128* 194*   D-Dimer No results for input(s): DDIMER in the last 72 hours. Hgb A1c Recent Labs    01/02/19 0354  HGBA1C 6.4*   Lipid Profile No results for input(s): CHOL, HDL, LDLCALC, TRIG, CHOLHDL, LDLDIRECT in the  last 72 hours. Thyroid function studies Recent Labs    01/02/19 1411  TSH 0.865   Anemia work up No results for input(s): VITAMINB12, FOLATE, FERRITIN, TIBC, IRON, RETICCTPCT in the last 72 hours. Urinalysis    Component Value Date/Time   COLORURINE YELLOW 12/27/2018 1445   APPEARANCEUR CLEAR 12/27/2018 1445   LABSPEC 1.004 (L) 12/27/2018 1445   PHURINE 5.0 12/27/2018 1445   GLUCOSEU NEGATIVE 12/27/2018 1445   HGBUR SMALL (A) 12/27/2018 1445   BILIRUBINUR NEGATIVE 12/27/2018 1445   KETONESUR NEGATIVE 12/27/2018 1445   PROTEINUR NEGATIVE 12/27/2018 1445   NITRITE  NEGATIVE 12/27/2018 1445   LEUKOCYTESUR NEGATIVE 12/27/2018 1445   Sepsis Labs Invalid input(s): PROCALCITONIN,  WBC,  LACTICIDVEN   Time coordinating discharge: 45 minutes  SIGNED:  Mercy Riding, MD  [facility] 01/03/2019, 10:26 AM  If 7PM-7AM, please contact night-coverage [email or web address] Password [email or web address]

## 2019-01-17 ENCOUNTER — Telehealth: Payer: Self-pay

## 2019-01-17 NOTE — Telephone Encounter (Signed)
Phone screening complete 

## 2019-01-18 ENCOUNTER — Encounter: Payer: Self-pay | Admitting: Internal Medicine

## 2019-01-18 ENCOUNTER — Ambulatory Visit (INDEPENDENT_AMBULATORY_CARE_PROVIDER_SITE_OTHER): Payer: BC Managed Care – PPO | Admitting: Internal Medicine

## 2019-01-18 ENCOUNTER — Other Ambulatory Visit: Payer: Self-pay

## 2019-01-18 VITALS — Ht 63.0 in | Wt 140.0 lb

## 2019-01-18 DIAGNOSIS — K51011 Ulcerative (chronic) pancolitis with rectal bleeding: Secondary | ICD-10-CM

## 2019-01-18 NOTE — Patient Instructions (Addendum)
1.  Continue Imuran 50 mg daily  2.  Decrease prednisone to 30 mg daily for 2 weeks then 20 mg daily for 2 weeks then 10 mg daily for 2 weeks then 10 mg every other day for 1 week, then stop  3.  Remicade infusion 10 mg/kg planned for July 31  4.  LABORATORIES: CBC, comprehensive metabolic panel, C-reactive protein.  Patient to come by the office next week and have these performed  5.  GI office follow-up with me in person in 4 to 6 weeks.  I have scheduled this for 03/06/19 at 8:30am.  If this is not convienent, please call the office to reschedule.  6.  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  SCHEDULE appointment with Dr. Maudie Mercury next week to manage blood sugars.  These will improve as we taper steroids.  We will see thereafter if she will require ongoing therapy.  Again, appreciate Dr. Julianne Rice help in this regard

## 2019-01-18 NOTE — Progress Notes (Signed)
HISTORY OF PRESENT ILLNESS:  Monica Ortega is a 28 y.o. female who presents today for telemedicine follow-up visit during the coronavirus pandemic regarding management of her severe refractory universal ulcerative colitis.  Summary as follows: 1.  Abrupt onset of acute colitis symptoms March 2020.  CT scan at that time revealed pancolitis.  Colonoscopy to the descending colon revealed severe ulcerative colitis with biopsy changes consistent with the same.  No evidence for infection.  Placed on mesalamine and steroids. 2.  Persistent symptoms and fever requiring hospitalization April 2020.  CT scan again revealed pancolitis.  Repeat limited colonoscopy revealed acute colitis.  Biopsies consistent with chronic UC.  No evidence for infection.  Begin Remicade therapy Found to have acute DVT in the left common and internal iliac veins as well as the right peroneal vein and left posterior tibial vein.  She was anticoagulated.  For bleeding had a IVC filter placed. 3.  Persistent symptoms despite medical therapies requiring hospitalization most recently June 18 through January 03, 2019.  Prednisone was increased to high-dose prednisone.  Imuran started.  Remicade provided at 10 mg/kg.  CT scan again with pancolitis.  No evidence for infection.  Developed significant and persistently elevated blood sugars for which she is now on Jardiance.  Discharge blood work included white blood cell count 6.4, hemoglobin 8.6, MCV 79, glucose 241  Since her discharge she reports that overall she is feeling better.  Current medications include prednisone 40 mg daily, Imuran 50 mg daily.  She is due for her next Remicade infusion at 10 mg/kg on February 08, 2019 at an outpatient infusion center here in [place].  She has had no blood work since her hospital discharge.  She tells me that she has had increased energy levels.  She is now exercising on her treadmill.  Her bowel movements are more formed and less frequent.  No bleeding.  She  does experience cramping abdominal pain prior to defecation.  She tends to have several bowel movements in the evening as well as overnight.  Her weight has been stable.  Appetite good.  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  She denies fevers or medication side effects upon questioning.  She was to have close follow-up with her PCP post hospital discharge for management of her blood sugars.  She has not made that appointment.  She sees Dr. Maudie Mercury  REVIEW OF SYSTEMS:  All non-GI ROS negative unless otherwise stated in the HPI.  Past Medical History:  Diagnosis Date  . Ankle sprain   . Anxiety   . Colitis   . Depression    denies hospitalization or medication  . DVT (deep venous thrombosis) ([place]) 10/31/2018  . DVT (deep venous thrombosis) ([place])   . Hyponatremia   . Universal ulcerative colitis with rectal bleeding ([place]) 10/31/2018    Past Surgical History:  Procedure Laterality Date  . BIOPSY  10/01/2018   Procedure: BIOPSY;  Surgeon: Doran Stabler, MD;  Location: [facility];  Service: Endoscopy;;  . BIOPSY  11/01/2018   Procedure: BIOPSY;  Surgeon: Gatha Mayer, MD;  Location: [facility];  Service: Endoscopy;;  . COLONOSCOPY WITH PROPOFOL N/A 10/01/2018   Procedure: COLONOSCOPY WITH PROPOFOL;  Surgeon: Doran Stabler, MD;  Location: [facility];  Service: Endoscopy;  Laterality: N/A;  . COLONOSCOPY WITH PROPOFOL N/A 11/01/2018   Procedure: COLONOSCOPY WITH PROPOFOL;  Surgeon: Gatha Mayer, MD;  Location: [facility] ENDOSCOPY;  Service: Endoscopy;  Laterality: N/A;  .  VENA CAVA FILTER PLACEMENT N/A 10/31/2018   Procedure: INSERTION VENA-CAVA FILTER;  Surgeon: Rosetta Posner, MD;  Location: [facility];  Service: Vascular;  Laterality: N/A;  . WISDOM TOOTH EXTRACTION      Social History Monica Ortega  reports that she has never smoked. She has never used smokeless tobacco. She reports that she does not drink alcohol or use drugs.  family history includes Diabetes in her  mother; Heart disease in her maternal aunt; Hypertension in her father.  No Known Allergies     PHYSICAL EXAMINATION: No physical examination with telehealth visit   ASSESSMENT:  1.  Severe universal ulcerative colitis which has been difficult to manage as outlined.  Currently on Imuran, prednisone, and Remicade.  Seems to be doing better since her most recent hospital discharge.  Possibly the best since her diagnosis 4 months ago.  I discussed with her in great detail particular medications and their effect on the immune system.  We discussed the relative risk of infections with the various agents as well as the additive effect.  I again discussed with her the potential role for colectomy.  She asked multiple excellent questions which were answered to her satisfaction. 2.  Iron deficiency anemia secondary to ulcerative colitis 3.  DVT as described.  On anticoagulation (Eliquis).  Status post IVC filter 4.  Diabetes mellitus in the face of high-dose steroids.  New diagnosis.  On Jardiance.  Close follow-up with Dr. Maudie Mercury recommended/required.  She agrees  PLAN:  1.  Continue Imuran 50 mg daily 2.  Decrease prednisone to 30 mg daily for 2 weeks then 20 mg daily for 2 weeks then 10 mg daily for 2 weeks then 10 mg every other day for 1 week, then stop 3.  Remicade infusion 10 mg/kg planned for July 31 4.  LABORATORIES: CBC, comprehensive metabolic panel, C-reactive protein.  Patient to come by the office next week and have these performed 5.  GI office follow-up with me in person in 4 to 6 weeks 6.  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  SCHEDULE appointment with Dr. Maudie Mercury next week to manage blood sugars.  These will improve as we taper steroids.  We will see thereafter if she will require ongoing therapy.  Again, appreciate Dr. Julianne Rice help in this regard  This telehealth medicine visit was initiated by and consented for by the patient who was in her home while I  was in my office.  She understands her may be an associated professional charge for this service which totaled 45 minutes in duration

## 2019-01-18 NOTE — Addendum Note (Signed)
Addended by: Audrea Muscat on: 01/18/2019 03:34 PM   Modules accepted: Orders

## 2019-01-21 ENCOUNTER — Telehealth: Payer: Self-pay

## 2019-01-21 ENCOUNTER — Other Ambulatory Visit (INDEPENDENT_AMBULATORY_CARE_PROVIDER_SITE_OTHER): Payer: BC Managed Care – PPO

## 2019-01-21 ENCOUNTER — Other Ambulatory Visit: Payer: Self-pay

## 2019-01-21 ENCOUNTER — Ambulatory Visit: Payer: BC Managed Care – PPO | Admitting: Psychology

## 2019-01-21 DIAGNOSIS — K51011 Ulcerative (chronic) pancolitis with rectal bleeding: Secondary | ICD-10-CM

## 2019-01-21 DIAGNOSIS — I82423 Acute embolism and thrombosis of iliac vein, bilateral: Secondary | ICD-10-CM

## 2019-01-21 LAB — COMPREHENSIVE METABOLIC PANEL
ALT: 11 U/L (ref 0–35)
AST: 7 U/L (ref 0–37)
Albumin: 4.1 g/dL (ref 3.5–5.2)
Alkaline Phosphatase: 56 U/L (ref 39–117)
BUN: 13 mg/dL (ref 6–23)
CO2: 26 mEq/L (ref 19–32)
Calcium: 9.4 mg/dL (ref 8.4–10.5)
Chloride: 101 mEq/L (ref 96–112)
Creatinine, Ser: 0.87 mg/dL (ref 0.40–1.20)
GFR: 76.18 mL/min (ref >60.00)
Glucose, Bld: 195 mg/dL — ABNORMAL HIGH (ref 70–99)
Potassium: 4.5 mEq/L (ref 3.5–5.1)
Sodium: 139 mEq/L (ref 135–145)
Total Bilirubin: 0.3 mg/dL (ref 0.2–1.2)
Total Protein: 6.9 g/dL (ref 6.0–8.3)

## 2019-01-21 LAB — CBC WITH DIFFERENTIAL/PLATELET
Basophils Absolute: 0 10*3/uL (ref 0.0–0.1)
Basophils Relative: 0.4 % (ref 0.0–3.0)
Eosinophils Absolute: 0 10*3/uL (ref 0.0–0.7)
Eosinophils Relative: 0.1 % (ref 0.0–5.0)
HCT: 40.8 % (ref 36.0–46.0)
Hemoglobin: 12.9 g/dL (ref 12.0–15.0)
Lymphocytes Relative: 11.7 % — ABNORMAL LOW (ref 12.0–46.0)
Lymphs Abs: 0.9 10*3/uL (ref 0.7–4.0)
MCHC: 31.6 g/dL (ref 30.0–36.0)
MCV: 80.1 fl (ref 78.0–100.0)
Monocytes Absolute: 0.1 10*3/uL (ref 0.1–1.0)
Monocytes Relative: 1.6 % — ABNORMAL LOW (ref 3.0–12.0)
Neutro Abs: 6.7 10*3/uL (ref 1.4–7.7)
Neutrophils Relative %: 86.2 % — ABNORMAL HIGH (ref 43.0–77.0)
Platelets: 313 10*3/uL (ref 150.0–400.0)
RBC: 5.1 Mil/uL (ref 3.87–5.11)
RDW: 22.8 % — ABNORMAL HIGH (ref 11.5–15.5)
WBC: 7.7 10*3/uL (ref 4.0–10.5)

## 2019-01-21 LAB — HIGH SENSITIVITY CRP: CRP, High Sensitivity: 23.19 mg/L — ABNORMAL HIGH (ref 0.000–5.000)

## 2019-01-21 NOTE — Telephone Encounter (Signed)
New orders faxed to [facility] and also spoke with RN in Infusion center regarding change in order.

## 2019-01-21 NOTE — Telephone Encounter (Signed)
-----   Message from Algernon Huxley, RN sent at 01/18/2019  3:16 PM EDT ----- Regarding: FW: Want to make sure she gets the proper dose of Remicade Call [phone] ----- Message ----- From: Irene Shipper, MD Sent: 01/18/2019   2:53 PM EDT To: Algernon Huxley, RN Subject: Want to make sure she gets the proper dose o#  Linda,This patient has outpatient Remicade infusion scheduled at an infusion center (she did not know the name) located at [place] on February 08, 2019.  Please make sure that she is receiving 10 mg/kg IV infusion (as opposed to the standard 5 mg/kg IV dosage).  Thank youDr. Henrene Pastor

## 2019-01-22 ENCOUNTER — Ambulatory Visit (INDEPENDENT_AMBULATORY_CARE_PROVIDER_SITE_OTHER): Payer: BC Managed Care – PPO | Admitting: Family Medicine

## 2019-01-22 ENCOUNTER — Telehealth: Payer: Self-pay | Admitting: *Deleted

## 2019-01-22 ENCOUNTER — Encounter: Payer: Self-pay | Admitting: Family Medicine

## 2019-01-22 ENCOUNTER — Other Ambulatory Visit: Payer: Self-pay

## 2019-01-22 DIAGNOSIS — R739 Hyperglycemia, unspecified: Secondary | ICD-10-CM

## 2019-01-22 DIAGNOSIS — K51011 Ulcerative (chronic) pancolitis with rectal bleeding: Secondary | ICD-10-CM | POA: Diagnosis not present

## 2019-01-22 DIAGNOSIS — T50905A Adverse effect of unspecified drugs, medicaments and biological substances, initial encounter: Secondary | ICD-10-CM | POA: Diagnosis not present

## 2019-01-22 MED ORDER — BLOOD GLUCOSE MONITOR KIT: PACK | 0 refills | Status: DC

## 2019-01-22 MED ORDER — LANCETS MISC: 1.0000 | 0 refills | Status: DC

## 2019-01-22 NOTE — Progress Notes (Signed)
Virtual Visit via Video Note  I connected with Monica Ortega  on 01/22/19 at  1:00 PM EDT by a video enabled telemedicine application and verified that I am speaking with the correct person using two identifiers.  Location patient: home Location provider:work or home office Persons participating in the virtual visit: patient, provider  I discussed the limitations of evaluation and management by telemedicine and the availability of in person appointments. The patient expressed understanding and agreed to proceed.   HPI:  Elevated blood sugars: -she is on steroids for ulcerative colitis - 51m daily initially, now down to 33mdaily -UC is doing better back on Remicade and she is now  tapering off of the steroids and will be completley off in 6 weeks -right now on Jardiance for the blood glucose - prescribed in the hospital, 1045maily -does not have a blood glucose monitor -had labs yesterday, postprandial and blood sugar was 195 -reports was running much higher in the hospital in the 200s -reports is feeling ok, no reported low blood sugars  ROS: See pertinent positives and negatives per HPI.  Past Medical History:  Diagnosis Date  . Ankle sprain   . Anxiety   . Colitis   . Depression    denies hospitalization or medication  . DVT (deep [place] thrombosis) ([facility]/22/[place]  . D[place] ([place] thrombosis) ([facility] . Hyponatremia   . Universal ulcerative colitis with rectal bleeding [place]/22/[place]    Past Surgical History:  Procedure Laterality Date  . BIOPSY  10/01/2018   Procedure: BIOPSY;  Surgeon: DanDoran StablerD;  [facility];;  . BIOPSY  11/01/2018   Procedure: BIOPSY;  Surgeon: GesGatha MayerD;  [facility];;  . COLONOSCOPY WITH PROPOFOL N/A 10/01/2018   Procedure: COLONOSCOPY WITH PROPOFOL;  Surgeon: DanDoran StablerD;  [facility];  Laterality: N/A;  . COLONOSCOPY  WITH PROPOFOL N/A 11/01/2018   Procedure: COLONOSCOPY WITH PROPOFOL;  Surgeon: GesGatha MayerD;  [facility];  Service: Endoscopy;  Laterality: N/A;  . VENA CAVA FILTER PLACEMENT N/A 10/31/2018   Procedure: INSERTION VENA-CAVA FILTER;  Surgeon: EarRosetta PosnerD;  [facility];  Service: Vascular;  Laterality: N/A;  . WISDOM TOOTH EXTRACTION      Family History  Problem Relation Age of Onset  . Diabetes Mother   . Hypertension Father   . Heart disease Maternal Aunt        enlarged heart  . Colon cancer Neg Hx   . Esophageal cancer Neg Hx   . Stomach cancer Neg Hx   . Pancreatic cancer Neg Hx   . Liver disease Neg Hx   . Inflammatory bowel disease Neg Hx     SOCIAL HX: see hpi   Current Outpatient Medications:  .  acetaminophen (ACETAMINOPHEN EXTRA STRENGTH) 500 MG tablet, Take 1,000 mg by mouth every 6 (six) hours as needed for headache (pain). , Disp: , Rfl:  .  apixaban (ELIQUIS) 5 MG TABS tablet, 2 tabs (2m2mO BID x 7 days, then 1 tab PO BID (Patient taking differently: Take 5 mg by mouth 2 (two) times daily. ), Disp: 74 tablet, Rfl: 5 .  azaTHIOprine (IMURAN) 50 MG tablet, Take 0.5 tablets (25 mg total) by mouth daily for 7 days, THEN 1 tablet (50 mg total) daily., Disp: 87 tablet, Rfl: 0 .  dicyclomine (BENTYL) 10 MG capsule, Take 1 capsule (  10 mg total) by mouth 4 (four) times daily -  before meals and at bedtime., Disp: 120 capsule, Rfl: 1 .  empagliflozin (JARDIANCE) 10 MG TABS tablet, Take 10 mg by mouth daily., Disp: 30 tablet, Rfl: 1 .  feeding supplement, ENSURE ENLIVE, (ENSURE ENLIVE) LIQD, Take 237 mLs by mouth 2 (two) times daily between meals. (Patient taking differently: Take 237 mLs by mouth daily. ), Disp: 237 mL, Rfl: 12 .  loperamide (IMODIUM) 2 MG capsule, Take 2-4 mg by mouth 2 (two) times daily as needed for diarrhea or loose stools. , Disp: , Rfl:  .  ondansetron (ZOFRAN) 8 MG tablet, Take 1 tablet (8 mg total) by mouth every 8 (eight) hours  as needed for nausea., Disp: 20 tablet, Rfl: 0 .  pantoprazole (PROTONIX) 40 MG tablet, Take 1 tablet (40 mg total) by mouth daily., Disp: 90 tablet, Rfl: 0 .  predniSONE (DELTASONE) 20 MG tablet, Take 3 tablets (60 mg total) by mouth daily with breakfast for 7 days, THEN 2 tablets (40 mg total) daily with breakfast for 23 days., Disp: 67 tablet, Rfl: 0 .  promethazine (PHENERGAN) 25 MG tablet, Take 1 tablet (25 mg total) by mouth every 6 (six) hours as needed for nausea or vomiting., Disp: 30 tablet, Rfl: 1 .  Simethicone (GAS-X PO), Take 1 capsule by mouth daily as needed (flatulence)., Disp: , Rfl:  .  Tetrahydrozoline HCl (VISINE OP), Apply 1 drop to eye daily as needed (dry eyes)., Disp: , Rfl:   EXAM:  VITALS per patient if applicable:  GENERAL: alert, oriented, appears well and in no acute distress  HEENT: atraumatic, conjunttiva clear, no obvious abnormalities on inspection of external nose and ears  NECK: normal movements of the head and neck  LUNGS: on inspection no signs of respiratory distress, breathing rate appears normal, no obvious gross SOB, gasping or wheezing  CV: no obvious cyanosis  MS: moves all visible extremities without noticeable abnormality  PSYCH/NEURO: pleasant and cooperative, no obvious depression or anxiety, speech and thought processing grossly intact  ASSESSMENT AND PLAN:  Discussed the following assessment and plan:  Hyperglycemia, drug-induced -  Universal ulcerative colitis with rectal bleeding (HCC)   Discussed options for monitoring and the management of blood glucose. Will send message to assistant to set her up with a glucose monitor. Will adjust Jardiance as needed. She agrees to call or send [place] her fasting glu readings in a few days. Advised checking fasting glu 4-5 days per week and checking sugar if feels bad. Follow up in 1-2 weeks. Will taper off Jardiance as steroid finish up and sugars balance out.   I discussed the assessment and  treatment plan with the patient. The patient was provided an opportunity to ask questions and all were answered. The patient agreed with the plan and demonstrated an understanding of the instructions.   The patient was advised to call back or seek an in-person evaluation if the symptoms worsen or if the condition fails to improve as anticipated.   Follow up instructions: Advised assistant Wendie Simmer to help patient arrange the following: -Glucose monitor for patient - please call her to review how to use or do doxy -follow up in 2 weeks   [name], DO

## 2019-01-22 NOTE — Patient Instructions (Signed)
-  monitor fasting blood sugars first thing in the morning and let us know in a few days and once weekly what those values are  -continue the Jardiance and let us know if you need a refill  -follow up in 2 weeks  Let us know if any low blood sugar under 70, if running over 170 or higher on average or if you have any questions.

## 2019-01-22 NOTE — Telephone Encounter (Signed)
-----   Message from [name], DO sent at 01/22/2019  1:23 PM EDT ----- -Glucose monitor for patient - please call her to review how to use or do doxy-follow up in 2 weeks

## 2019-01-22 NOTE — Telephone Encounter (Signed)
I called the pt and informed her a glucometer kit and lancets was sent to the pharmacy.  Patient stated she can figure out how to use this and will call back if she has any questions.  Follow up appt scheduled for 7/31.

## 2019-01-22 NOTE — Addendum Note (Signed)
Addended by: Agnes Lawrence on: 01/22/2019 03:55 PM   Modules accepted: Orders

## 2019-01-22 NOTE — Telephone Encounter (Signed)
Error in previous message-appt scheduled for 7/30.

## 2019-01-24 NOTE — Telephone Encounter (Signed)
Office note faxed to [facility] stating pt should have 62m/kg Remicade per Dr PHenrene Pastor

## 2019-01-24 NOTE — Telephone Encounter (Signed)
Monica Ortega from [facility] called to request notes to support order to increase Remicade infusion.

## 2019-01-25 ENCOUNTER — Encounter: Payer: Self-pay | Admitting: Family Medicine

## 2019-01-25 NOTE — Telephone Encounter (Signed)
Fyi: looks like you had seen her for hyperglycemia, steroid induced. Just wanted you to be aware of patient advise request message.

## 2019-01-28 ENCOUNTER — Encounter: Payer: Self-pay | Admitting: Family Medicine

## 2019-01-29 ENCOUNTER — Encounter: Payer: Self-pay | Admitting: *Deleted

## 2019-01-29 ENCOUNTER — Ambulatory Visit (INDEPENDENT_AMBULATORY_CARE_PROVIDER_SITE_OTHER)
Admission: RE | Admit: 2019-01-29 | Discharge: 2019-01-29 | Disposition: A | Discharge status: Home / Self Care | Payer: BC Managed Care – PPO | Source: Ambulatory Visit | Attending: Vascular Surgery | Admitting: Vascular Surgery

## 2019-01-29 ENCOUNTER — Ambulatory Visit (HOSPITAL_COMMUNITY)
Admission: RE | Admit: 2019-01-29 | Discharge: 2019-01-29 | Disposition: A | Discharge status: Home / Self Care | Payer: BC Managed Care – PPO | Source: Ambulatory Visit | Attending: Vascular Surgery | Admitting: Vascular Surgery

## 2019-01-29 ENCOUNTER — Ambulatory Visit: Payer: BC Managed Care – PPO | Admitting: Vascular Surgery

## 2019-01-29 ENCOUNTER — Encounter: Payer: Self-pay | Admitting: Vascular Surgery

## 2019-01-29 ENCOUNTER — Other Ambulatory Visit: Payer: Self-pay | Admitting: *Deleted

## 2019-01-29 ENCOUNTER — Encounter (HOSPITAL_COMMUNITY): Payer: BLUE CROSS/BLUE SHIELD

## 2019-01-29 ENCOUNTER — Ambulatory Visit: Payer: BLUE CROSS/BLUE SHIELD | Admitting: Vascular Surgery

## 2019-01-29 ENCOUNTER — Other Ambulatory Visit: Payer: Self-pay

## 2019-01-29 VITALS — BP 126/88 | HR 70 | Temp 97.3°F | Resp 20 | Ht 63.0 in | Wt 135.3 lb

## 2019-01-29 DIAGNOSIS — I82423 Acute embolism and thrombosis of iliac vein, bilateral: Secondary | ICD-10-CM

## 2019-01-29 NOTE — Progress Notes (Signed)
[facility] [place]  Patient name: Monica Ortega MRN: [id number] DOB: Dec 19, 1990 Sex: female  REASON FOR VISIT: Follow-up right leg DVT  HPI: Monica Ortega is a 28 y.o. female here for follow-up.  Had suffered a right leg DVT.  Has history of ulcerative colitis and was having a flareup with active bleeding and therefore had vena cava filter placement and initiation of anticoagulation following resolution of her bleeding.  I had seen her in April to discuss removal of her vena cava filter.  At that time duplex showed that she had had some progression of thrombus in her right leg and we elected to defer for another several months.  She is here today for follow-up.  She reports that she is having occasional mild bleeding but this is at her baseline.  She has tolerated the anticoagulation.  She reports that all swelling in her right leg is completely resolved.  Past Medical History:  Diagnosis Date  . Ankle sprain   . Anxiety   . Colitis   . Depression    denies hospitalization or medication  . DVT (deep venous thrombosis) ([place]) 10/31/2018  . DVT (deep venous thrombosis) ([place])   . Hyponatremia   . Universal ulcerative colitis with rectal bleeding ([place]) 10/31/2018    Family History  Problem Relation Age of Onset  . Diabetes Mother   . Hypertension Father   . Heart disease Maternal Aunt        enlarged heart  . Colon cancer Neg Hx   . Esophageal cancer Neg Hx   . Stomach cancer Neg Hx   . Pancreatic cancer Neg Hx   . Liver disease Neg Hx   . Inflammatory bowel disease Neg Hx     SOCIAL HISTORY: Social History   Tobacco Use  . Smoking status: Never Smoker  . Smokeless tobacco: Never Used  Substance Use Topics  . Alcohol use: No    Comment: denied alcohol    No Known Allergies  Current Outpatient Medications  Medication Sig Dispense Refill  . acetaminophen (ACETAMINOPHEN EXTRA STRENGTH) 500 MG tablet Take 1,000 mg by  mouth every 6 (six) hours as needed for headache (pain).     Marland Kitchen apixaban (ELIQUIS) 5 MG TABS tablet 2 tabs (87m) PO BID x 7 days, then 1 tab PO BID (Patient taking differently: Take 5 mg by mouth 2 (two) times daily. ) 74 tablet 5  . azaTHIOprine (IMURAN) 50 MG tablet Take 0.5 tablets (25 mg total) by mouth daily for 7 days, THEN 1 tablet (50 mg total) daily. 87 tablet 0  . blood glucose meter kit and supplies KIT Use as directed 1 each 0  . dicyclomine (BENTYL) 10 MG capsule Take 1 capsule (10 mg total) by mouth 4 (four) times daily -  before meals and at bedtime. 120 capsule 1  . empagliflozin (JARDIANCE) 10 MG TABS tablet Take 10 mg by mouth daily. 30 tablet 1  . feeding supplement, ENSURE ENLIVE, (ENSURE ENLIVE) LIQD Take 237 mLs by mouth 2 (two) times daily between meals. (Patient taking differently: Take 237 mLs by mouth daily. ) 237 mL 12  . Lancets MISC 1 each by Does not apply route as directed. 100 each 0  . loperamide (IMODIUM) 2 MG capsule Take 2-4 mg by mouth 2 (two) times daily as needed for diarrhea or loose stools.     . ondansetron (ZOFRAN) 8 MG tablet Take 1 tablet (8 mg total) by mouth every 8 (eight) hours  as needed for nausea. 20 tablet 0  . pantoprazole (PROTONIX) 40 MG tablet Take 1 tablet (40 mg total) by mouth daily. 90 tablet 0  . predniSONE (DELTASONE) 20 MG tablet Take 3 tablets (60 mg total) by mouth daily with breakfast for 7 days, THEN 2 tablets (40 mg total) daily with breakfast for 23 days. 67 tablet 0  . promethazine (PHENERGAN) 25 MG tablet Take 1 tablet (25 mg total) by mouth every 6 (six) hours as needed for nausea or vomiting. 30 tablet 1  . Simethicone (GAS-X PO) Take 1 capsule by mouth daily as needed (flatulence).    . Tetrahydrozoline HCl (VISINE OP) Apply 1 drop to eye daily as needed (dry eyes).     No current facility-administered medications for this visit.     REVIEW OF SYSTEMS:  [X]  denotes positive finding, [ ]  denotes negative finding Cardiac   Comments:  Chest pain or chest pressure:    Shortness of breath upon exertion:    Short of breath when lying flat:    Irregular heart rhythm:        Vascular    Pain in calf, thigh, or hip brought on by ambulation:    Pain in feet at night that wakes you up from your sleep:     Blood clot in your veins:    Leg swelling:           PHYSICAL EXAM: Vitals:   01/29/19 0856  BP: 126/88  Pulse: 70  Resp: 20  Temp: (!) 97.3 F (36.3 C)  SpO2: 97%  Weight: 135 lb 4.8 oz (61.4 kg)  Height: 5' 3"  (1.6 m)    GENERAL: The patient is a well-nourished female, in no acute distress. The vital signs are documented above. CARDIOVASCULAR: Palpable dorsalis pedis pulses bilaterally PULMONARY: There is good air exchange  MUSCULOSKELETAL: There are no major deformities or cyanosis. NEUROLOGIC: No focal weakness or paresthesias are detected. SKIN: There are no ulcers or rashes noted. PSYCHIATRIC: The patient has a normal affect.  DATA:  Duplex shows regression of the thrombus in her right leg.  No left leg thrombus  MEDICAL ISSUES: Stable right leg DVT on anticoagulation.  No further indication for vena cava filter.  Discussed removal of this as an outpatient in the peripheral vascular lab.  Introduced patient to Dr. Carlis Abbott who will be doing the procedure and he discussed the procedure with her as well under local with sedation.  We will schedule this at her earliest [place]. Early, MD [facility] Tel [phone] Pager [phone]

## 2019-02-02 ENCOUNTER — Other Ambulatory Visit (HOSPITAL_COMMUNITY)
Admission: RE | Admit: 2019-02-02 | Discharge: 2019-02-02 | Disposition: A | Discharge status: Home / Self Care | Payer: BC Managed Care – PPO | Source: Ambulatory Visit | Attending: Vascular Surgery | Admitting: Vascular Surgery

## 2019-02-02 DIAGNOSIS — Z1159 Encounter for screening for other viral diseases: Secondary | ICD-10-CM | POA: Insufficient documentation

## 2019-02-02 LAB — SARS CORONAVIRUS 2 (TAT 6-24 HRS): SARS Coronavirus 2: NEGATIVE

## 2019-02-05 ENCOUNTER — Ambulatory Visit (INDEPENDENT_AMBULATORY_CARE_PROVIDER_SITE_OTHER): Payer: BC Managed Care – PPO | Admitting: Psychology

## 2019-02-05 DIAGNOSIS — F4010 Social phobia, unspecified: Secondary | ICD-10-CM | POA: Diagnosis not present

## 2019-02-05 DIAGNOSIS — F341 Dysthymic disorder: Secondary | ICD-10-CM | POA: Diagnosis not present

## 2019-02-06 ENCOUNTER — Ambulatory Visit (HOSPITAL_COMMUNITY)
Admission: RE | Admit: 2019-02-06 | Discharge: 2019-02-06 | Disposition: A | Discharge status: Home / Self Care | Payer: BC Managed Care – PPO | Attending: Vascular Surgery | Admitting: Vascular Surgery

## 2019-02-06 ENCOUNTER — Encounter (HOSPITAL_COMMUNITY): Payer: Self-pay | Admitting: Vascular Surgery

## 2019-02-06 ENCOUNTER — Other Ambulatory Visit: Payer: Self-pay

## 2019-02-06 ENCOUNTER — Encounter (HOSPITAL_COMMUNITY)
Admission: RE | Disposition: A | Discharge status: Home / Self Care | Payer: Self-pay | Source: Home / Self Care | Attending: Vascular Surgery

## 2019-02-06 DIAGNOSIS — Z452 Encounter for adjustment and management of vascular access device: Secondary | ICD-10-CM | POA: Insufficient documentation

## 2019-02-06 DIAGNOSIS — Z7901 Long term (current) use of anticoagulants: Secondary | ICD-10-CM | POA: Diagnosis not present

## 2019-02-06 DIAGNOSIS — Z86718 Personal history of other venous thrombosis and embolism: Secondary | ICD-10-CM | POA: Insufficient documentation

## 2019-02-06 DIAGNOSIS — Z8249 Family history of ischemic heart disease and other diseases of the circulatory system: Secondary | ICD-10-CM | POA: Diagnosis not present

## 2019-02-06 DIAGNOSIS — Z79899 Other long term (current) drug therapy: Secondary | ICD-10-CM | POA: Insufficient documentation

## 2019-02-06 DIAGNOSIS — I82423 Acute embolism and thrombosis of iliac vein, bilateral: Secondary | ICD-10-CM | POA: Diagnosis not present

## 2019-02-06 HISTORY — PX: IVC FILTER REMOVAL: CATH118246

## 2019-02-06 LAB — POCT I-STAT, CHEM 8
BUN: 11 mg/dL (ref 6–20)
Calcium, Ion: 1.29 mmol/L (ref 1.15–1.40)
Chloride: 100 mmol/L (ref 98–111)
Creatinine, Ser: 0.7 mg/dL (ref 0.44–1.00)
Glucose, Bld: 126 mg/dL — ABNORMAL HIGH (ref 70–99)
HCT: 40 % (ref 36.0–46.0)
Hemoglobin: 13.6 g/dL (ref 12.0–15.0)
Potassium: 3.1 mmol/L — ABNORMAL LOW (ref 3.5–5.1)
Sodium: 140 mmol/L (ref 135–145)
TCO2: 25 mmol/L (ref 22–32)

## 2019-02-06 LAB — PREGNANCY, URINE: Preg Test, Ur: NEGATIVE

## 2019-02-06 SURGERY — IVC FILTER REMOVAL
Anesthesia: LOCAL

## 2019-02-06 MED ORDER — SODIUM CHLORIDE 0.9% FLUSH: 3.0000 mL | INTRAVENOUS | Status: DC | PRN

## 2019-02-06 MED ORDER — OXYCODONE HCL 5 MG PO TABS: 5.0000 mg | ORAL_TABLET | ORAL | Status: DC | PRN

## 2019-02-06 MED ORDER — MIDAZOLAM HCL 2 MG/2ML IJ SOLN
INTRAMUSCULAR | Status: AC
  Filled 2019-02-06: qty 2

## 2019-02-06 MED ORDER — FENTANYL CITRATE (PF) 100 MCG/2ML IJ SOLN
INTRAMUSCULAR | Status: DC | PRN
  Administered 2019-02-06: 50 ug via INTRAVENOUS
  Administered 2019-02-06 (×2): 25 ug via INTRAVENOUS

## 2019-02-06 MED ORDER — ACETAMINOPHEN 325 MG PO TABS: 650.0000 mg | ORAL_TABLET | ORAL | Status: DC | PRN

## 2019-02-06 MED ORDER — ONDANSETRON HCL 4 MG/2ML IJ SOLN: 4.0000 mg | INTRAMUSCULAR | Status: DC | PRN

## 2019-02-06 MED ORDER — LIDOCAINE HCL (PF) 1 % IJ SOLN
INTRAMUSCULAR | Status: AC
  Filled 2019-02-06: qty 30

## 2019-02-06 MED ORDER — LIDOCAINE HCL (PF) 1 % IJ SOLN
INTRAMUSCULAR | Status: DC | PRN
  Administered 2019-02-06: 5 mL via INTRADERMAL

## 2019-02-06 MED ORDER — IODIXANOL 320 MG/ML IV SOLN
INTRAVENOUS | Status: DC | PRN
  Administered 2019-02-06: 10 mL via INTRAVENOUS

## 2019-02-06 MED ORDER — HEPARIN (PORCINE) IN NACL 1000-0.9 UT/500ML-% IV SOLN
INTRAVENOUS | Status: AC
  Filled 2019-02-06: qty 500

## 2019-02-06 MED ORDER — HYDROMORPHONE HCL 1 MG/ML IJ SOLN
INTRAMUSCULAR | Status: DC | PRN
  Administered 2019-02-06: 0.5 mg via INTRAVENOUS

## 2019-02-06 MED ORDER — HYDRALAZINE HCL 20 MG/ML IJ SOLN: 5.0000 mg | INTRAMUSCULAR | Status: DC | PRN

## 2019-02-06 MED ORDER — HEPARIN (PORCINE) IN NACL 1000-0.9 UT/500ML-% IV SOLN
INTRAVENOUS | Status: DC | PRN
  Administered 2019-02-06: 500 mL

## 2019-02-06 MED ORDER — MIDAZOLAM HCL 2 MG/2ML IJ SOLN
INTRAMUSCULAR | Status: DC | PRN
  Administered 2019-02-06 (×2): 1 mg via INTRAVENOUS

## 2019-02-06 MED ORDER — FENTANYL CITRATE (PF) 100 MCG/2ML IJ SOLN
INTRAMUSCULAR | Status: AC
  Filled 2019-02-06: qty 2

## 2019-02-06 MED ORDER — LABETALOL HCL 5 MG/ML IV SOLN: 10.0000 mg | INTRAVENOUS | Status: DC | PRN

## 2019-02-06 MED ORDER — ONDANSETRON HCL 4 MG/2ML IJ SOLN
4.0000 mg | Freq: Four times a day (QID) | INTRAMUSCULAR | Status: DC | PRN
  Administered 2019-02-06: 4 mg via INTRAVENOUS

## 2019-02-06 MED ORDER — SODIUM CHLORIDE 0.9 % IV SOLN: 250.0000 mL | INTRAVENOUS | Status: DC | PRN

## 2019-02-06 MED ORDER — HYDROMORPHONE HCL 1 MG/ML IJ SOLN: 0.5000 mg | INTRAMUSCULAR | Status: DC | PRN

## 2019-02-06 MED ORDER — HYDROMORPHONE HCL 1 MG/ML IJ SOLN
INTRAMUSCULAR | Status: AC
  Filled 2019-02-06: qty 0.5

## 2019-02-06 MED ORDER — ONDANSETRON HCL 4 MG/2ML IJ SOLN
INTRAMUSCULAR | Status: AC
  Filled 2019-02-06: qty 2

## 2019-02-06 MED ORDER — SODIUM CHLORIDE 0.9 % IV SOLN: INTRAVENOUS | Status: DC

## 2019-02-06 MED ORDER — SODIUM CHLORIDE 0.9% FLUSH: 3.0000 mL | Freq: Two times a day (BID) | INTRAVENOUS | Status: DC

## 2019-02-06 MED ORDER — SODIUM CHLORIDE 0.9 % IV SOLN
INTRAVENOUS | Status: DC
  Administered 2019-02-06: 09:00:00 via INTRAVENOUS

## 2019-02-06 SURGICAL SUPPLY — 14 items
CATH INFINITI 5 FR LCB (CATHETERS) ×2 IMPLANT
CATH OMNI FLUSH 5F 65CM (CATHETERS) ×2 IMPLANT
CATH SOS OMNI O 5F 80CM (CATHETERS) ×2 IMPLANT
COVER DOME SNAP 22 D (MISCELLANEOUS) ×2 IMPLANT
DEVICE ONE SNARE 10MM (MISCELLANEOUS) ×2 IMPLANT
DEVICE ONE SNARE 15MM (VASCULAR PRODUCTS) ×2 IMPLANT
GUIDEWIRE ANGLED .035X260CM (WIRE) ×2 IMPLANT
KIT MICROPUNCTURE NIT STIFF (SHEATH) ×2 IMPLANT
PROTECTION STATION PRESSURIZED (MISCELLANEOUS) ×2
SET VENACAVA FILTER RETRIEVAL (MISCELLANEOUS) ×2 IMPLANT
SHEATH PROBE COVER 6X72 (BAG) ×2 IMPLANT
STATION PROTECTION PRESSURIZED (MISCELLANEOUS) ×1 IMPLANT
TRAY PV CATH (CUSTOM PROCEDURE TRAY) ×2 IMPLANT
WIRE BENTSON .035X145CM (WIRE) ×2 IMPLANT

## 2019-02-06 NOTE — Op Note (Signed)
    Patient name: Monica Ortega MRN: [id number] DOB: Aug 06, 1990 Sex: female  02/06/2019 Pre-operative Diagnosis: ivc filter with history of dvt Post-operative diagnosis:  Same Surgeon:  Eda Paschal. Donzetta Matters, MD Procedure Performed: 1.  US guided cannulation right IJ 2.  Retrieval of IVC filter 3.  IVC venogram 4.  Moderate sedation with fentanyl and Versed for 44 minutes  Indications: 28 year old female with history of IVC filter placement at the time she had DVT cannot be anticoagulated.  She is now indicated for IVC filter retrieval.  Findings: IVC filter appeared to be somewhat tilted and likely the hook was embedded in the wall.  This could not be retrieved with routine snare.  We were able to get a Glidewire around the filter and retrieve it that way.  Completion venogram demonstrated no extravasation.   Procedure:  The patient was identified in the holding area and taken to room 8.  The patient was then placed supine on the table and prepped and draped in the usual sterile fashion.  A time out was called.  Ultrasound was used to evaluate the right internal jugular vein.  Images saved the permanent record.  The area was anesthetized 1% lidocaine.  We cannulated this with micropuncture needle followed by wire and sheath.  A Bentson wire was placed into the IVC under fluoroscopic guidance.  We placed the retrieval 12 French sheath down to the IVC.  Attempted to snare the hook of the filter but could not.  I then exchanged the dilator for a catheter to direct the snare both the 10 mm and a 15 mm snare towards the hook also could not get close despite multiple views.  I then used a sos.  Catheter to go around the filter with Glidewire and I snared this Glidewire and establish through and through access underneath the filter.  I then placed the sheath dilator back was able to feed this over the filter and remove the filter and it was intact.  Venogram demonstrated what I thought initially might have  been extravasation but on delayed imaging there was no further contrast was likely a small branch.  Sheath was then removed pressure held for 10 minutes until hemostasis obtained.  She tolerated procedure without immediate complication.   Contrast: 10cc  Brandon C. Donzetta Matters, MD [facility] [place] Office: [phone] Pager: [phone]

## 2019-02-06 NOTE — Discharge Instructions (Signed)
Inferior Vena Cava Filter Removal, Care After This sheet gives you information about how to care for yourself after your procedure. Your health care provider may also give you more specific instructions. If you have problems or questions, contact your health care provider. What can I expect after the procedure? After the procedure, it is common to have:  Mild pain and bruising around your incision in your neck or groin.  Fatigue. Follow these instructions at home: Incision care   Follow instructions from your health care provider about how to take care of your incision. Make sure you: ? Wash your hands with soap and water before you change your bandage (dressing). If soap and water are not available, use hand sanitizer. ? Change your dressing as told by your health care provider.  Check your incision area every day for signs of infection. Check for: ? Redness, swelling, or more pain. ? Fluid or blood. ? Warmth. ? Pus or a bad smell. General instructions  Take over-the-counter and prescription medicines only as told by your health care provider.  Do not take baths, swim, or use a hot tub until your health care provider approves. Ask your health care provider if you may take showers. You may only be allowed to take sponge baths.  Do not drive for 24 hours if you were given a medicine to help you relax (sedative) during your procedure.  Return to your normal activities as told by your health care provider. Ask your health care provider what activities are safe for you.  Keep all follow-up visits as told by your health care provider. This is important. Contact a health care provider if:  You have chills or a fever.  You have redness, swelling, or more pain around your incision.  Your incision feels warm to the touch.  You have pus or a bad smell coming from your incision. Get help right away if:  You have blood coming from your incision (active bleeding). ? If you have  bleeding from the incision site, lie down, apply pressure to the area with a clean cloth or gauze, and get help right away.  You have chest pain.  You have difficulty breathing. Summary  Follow instructions from your health care provider about how to take care of your incision.  Return to your normal activities as told by your health care provider.  Check your incision area every day for signs of infection.  Get help right away if you have active bleeding, chest pain, or trouble breathing. This information is not intended to replace advice given to you by your health care provider. Make sure you discuss any questions you have with your health care provider. Document Released: 01/05/2017 Document Revised: 06/09/2017 Document Reviewed: 01/05/2017 Elsevier Patient Education  2020 [employer].

## 2019-02-06 NOTE — H&P (Signed)
   History and Physical Update  The patient was interviewed and re-examined.  The patient's previous History and Physical has been reviewed and is unchanged from recent office visit. Plan to remove ivc filter today.  Brandon C. Donzetta Matters, MD [facility] Office: [phone] Pager: [phone]  02/06/2019, 8:54 AM

## 2019-02-07 ENCOUNTER — Encounter: Payer: Self-pay | Admitting: Family Medicine

## 2019-02-07 ENCOUNTER — Ambulatory Visit (INDEPENDENT_AMBULATORY_CARE_PROVIDER_SITE_OTHER): Payer: BC Managed Care – PPO | Admitting: Family Medicine

## 2019-02-07 DIAGNOSIS — R739 Hyperglycemia, unspecified: Secondary | ICD-10-CM | POA: Diagnosis not present

## 2019-02-07 DIAGNOSIS — K51919 Ulcerative colitis, unspecified with unspecified complications: Secondary | ICD-10-CM | POA: Diagnosis not present

## 2019-02-07 NOTE — Progress Notes (Signed)
Virtual Visit via Video Note  I connected with Monica Ortega  on 02/07/19 at 11:00 AM EDT by a video enabled telemedicine application and verified that I am speaking with the correct person using two identifiers.  Location patient: home Location provider:work or home office Persons participating in the virtual visit: patient, provider    HPI:  Follow up Hyperglycemia: -steroid induced -tapering of steroids for IBD - now on 72m daiy -on jardiance for management hyperglycemia - rxd in hospital -blood sugars on home monitoring now better in the low 100 range, no low blood sugars -feels well, bowels improved, denies shakiness, sweating, fatigue  ROS: See pertinent positives and negatives per HPI.  Past Medical History:  Diagnosis Date  . Ankle sprain   . Anxiety   . Colitis   . Depression    denies hospitalization or medication  . [place] [place]  . D[place] (deep [place]) ([facility]   . Hyponatremia   . Universal ulcerative colitis with rectal bleeding ([facility] 10/31/2018    Past Surgical History:  Procedure Laterality Date  . BIOPSY  10/01/2018   Procedure: BIOPSY;  Surgeon: DDoran Stabler MD;  [facility] [facility]  Service: Endoscopy;;  . BIOPSY  11/01/2018   Procedure: BIOPSY;  Surgeon: GGatha Mayer MD;  [place] [facility]  Service: Endoscopy;;  . COLONOSCOPY WITH PROPOFOL N/A 10/01/2018   Procedure: COLONOSCOPY WITH PROPOFOL;  Surgeon: DDoran Stabler MD;  [place] [facility]  Service: Endoscopy;  Laterality: N/A;  . COLONOSCOPY WITH PROPOFOL N/A 11/01/2018   Procedure: COLONOSCOPY WITH PROPOFOL;  Surgeon: GGatha Mayer MD;  [facility];  Service: Endoscopy;  Laterality: N/A;  . IVC FILTER REMOVAL N/A 02/06/2019   Procedure: IVC FILTER REMOVAL;  Surgeon: CWaynetta Sandy MD;  [facility];  Service: Cardiovascular;  Laterality: N/A;  . VENA CAVA FILTER PLACEMENT N/A 10/31/2018   Procedure:  INSERTION VENA-CAVA FILTER;  Surgeon: ERosetta Posner MD;  [facility];  Service: Vascular;  Laterality: N/A;  . WISDOM TOOTH EXTRACTION      Family History  Problem Relation Age of Onset  . Diabetes Mother   . Hypertension Father   . Heart disease Maternal Aunt        enlarged heart  . Colon cancer Neg Hx   . Esophageal cancer Neg Hx   . Stomach cancer Neg Hx   . Pancreatic cancer Neg Hx   . Liver disease Neg Hx   . Inflammatory bowel disease Neg Hx     SOCIAL HX: see hpi   Current Outpatient Medications:  .  acetaminophen (ACETAMINOPHEN EXTRA STRENGTH) 500 MG tablet, Take 1,000 mg by mouth every 6 (six) hours as needed for headache (pain). , Disp: , Rfl:  .  apixaban (ELIQUIS) 5 MG TABS tablet, 2 tabs (155m PO BID x 7 days, then 1 tab PO BID (Patient taking differently: Take 5 mg by mouth 2 (two) times daily. ), Disp: 74 tablet, Rfl: 5 .  azaTHIOprine (IMURAN) 50 MG tablet, Take 0.5 tablets (25 mg total) by mouth daily for 7 days, THEN 1 tablet (50 mg total) daily. (Patient taking differently: 50 mg by mouth daily.), Disp: 87 tablet, Rfl: 0 .  blood glucose meter kit and supplies KIT, Use as directed, Disp: 1 each, Rfl: 0 .  dicyclomine (BENTYL) 10 MG capsule, Take 1 capsule (10 mg total) by mouth 4 (four) times daily -  before meals and at bedtime., Disp:  120 capsule, Rfl: 1 .  empagliflozin (JARDIANCE) 10 MG TABS tablet, Take 10 mg by mouth daily., Disp: 30 tablet, Rfl: 1 .  feeding supplement, ENSURE ENLIVE, (ENSURE ENLIVE) LIQD, Take 237 mLs by mouth 2 (two) times daily between meals. (Patient taking differently: Take 237 mLs by mouth daily. ), Disp: 237 mL, Rfl: 12 .  Lancets MISC, 1 each by Does not apply route as directed., Disp: 100 each, Rfl: 0 .  loperamide (IMODIUM) 2 MG capsule, Take 2-4 mg by mouth 2 (two) times daily as needed for diarrhea or loose stools. , Disp: , Rfl:  .  ondansetron (ZOFRAN) 8 MG tablet, Take 1 tablet (8 mg total) by mouth every 8 (eight) hours  as needed for nausea., Disp: 20 tablet, Rfl: 0 .  pantoprazole (PROTONIX) 40 MG tablet, Take 1 tablet (40 mg total) by mouth daily., Disp: 90 tablet, Rfl: 0 .  promethazine (PHENERGAN) 25 MG tablet, Take 1 tablet (25 mg total) by mouth every 6 (six) hours as needed for nausea or vomiting., Disp: 30 tablet, Rfl: 1 .  Simethicone (GAS-X PO), Take 1 capsule by mouth daily as needed (flatulence)., Disp: , Rfl:   EXAM:  VITALS per patient if applicable:  GENERAL: alert, oriented, appears well and in no acute distress  HEENT: atraumatic, conjunttiva clear, no obvious abnormalities on inspection of external nose and ears  NECK: normal movements of the head and neck  LUNGS: on inspection no signs of respiratory distress, breathing rate appears normal, no obvious gross SOB, gasping or wheezing  CV: no obvious cyanosis  MS: moves all visible extremities without noticeable abnormality  PSYCH/NEURO: pleasant and cooperative, no obvious depression or anxiety, speech and thought processing grossly intact  ASSESSMENT AND PLAN:  Discussed the following assessment and plan:  Hyperglycemia   Ulcerative colitis with complication, unspecified location [facility]) -  Continue current management. She agrees to send weekly BS readings log via MyChart and call our office if any questions.    I discussed the assessment and treatment plan with the patient. The patient was provided an opportunity to ask questions and all were answered. The patient agreed with the plan and demonstrated an understanding of the instructions.   The patient was advised to call back or seek an in-person evaluation if the symptoms worsen or if the condition fails to improve as anticipated.   [name], DO

## 2019-02-08 DIAGNOSIS — D649 Anemia, unspecified: Secondary | ICD-10-CM | POA: Diagnosis not present

## 2019-02-08 DIAGNOSIS — Z79899 Other long term (current) drug therapy: Secondary | ICD-10-CM | POA: Diagnosis not present

## 2019-02-08 DIAGNOSIS — K5190 Ulcerative colitis, unspecified, without complications: Secondary | ICD-10-CM | POA: Diagnosis not present

## 2019-02-12 ENCOUNTER — Encounter: Payer: Self-pay | Admitting: Family Medicine

## 2019-02-23 ENCOUNTER — Encounter: Payer: Self-pay | Admitting: Family Medicine

## 2019-03-04 ENCOUNTER — Telehealth: Payer: Self-pay | Admitting: *Deleted

## 2019-03-04 ENCOUNTER — Ambulatory Visit (INDEPENDENT_AMBULATORY_CARE_PROVIDER_SITE_OTHER): Payer: BC Managed Care – PPO | Admitting: Psychology

## 2019-03-04 ENCOUNTER — Encounter: Payer: Self-pay | Admitting: Family Medicine

## 2019-03-04 DIAGNOSIS — Z111 Encounter for screening for respiratory tuberculosis: Secondary | ICD-10-CM

## 2019-03-04 DIAGNOSIS — F4010 Social phobia, unspecified: Secondary | ICD-10-CM

## 2019-03-04 DIAGNOSIS — F341 Dysthymic disorder: Secondary | ICD-10-CM

## 2019-03-04 NOTE — Telephone Encounter (Signed)
Copied from [place] [id number]. Topic: Appointment Scheduling - Scheduling Inquiry for Clinic >> Feb 28, 2019  3:08 PM Alanda Slim E wrote: Reason for CRM: Quantiferon OR TB blood test for her to go into clinicals / please advise

## 2019-03-05 ENCOUNTER — Encounter: Payer: Self-pay | Admitting: Family Medicine

## 2019-03-05 MED ORDER — JARDIANCE 10 MG PO TABS: 10.0000 mg | ORAL_TABLET | Freq: Every day | ORAL | 0 refills | Status: DC

## 2019-03-05 NOTE — Telephone Encounter (Signed)
Please send 30 day refill. Thanks!

## 2019-03-05 NOTE — Telephone Encounter (Signed)
Ok to order quant gold and set up lab visit. Thanks!

## 2019-03-05 NOTE — Telephone Encounter (Signed)
Lab appt scheduled for 8/27 at 9:40am.

## 2019-03-06 ENCOUNTER — Other Ambulatory Visit (INDEPENDENT_AMBULATORY_CARE_PROVIDER_SITE_OTHER): Payer: BC Managed Care – PPO

## 2019-03-06 ENCOUNTER — Encounter: Payer: Self-pay | Admitting: Internal Medicine

## 2019-03-06 ENCOUNTER — Other Ambulatory Visit: Payer: Self-pay

## 2019-03-06 ENCOUNTER — Ambulatory Visit: Payer: BC Managed Care – PPO | Admitting: Internal Medicine

## 2019-03-06 VITALS — BP 108/82 | HR 104 | Temp 98.3°F | Ht 63.5 in | Wt 139.0 lb

## 2019-03-06 DIAGNOSIS — D500 Iron deficiency anemia secondary to blood loss (chronic): Secondary | ICD-10-CM

## 2019-03-06 DIAGNOSIS — K51919 Ulcerative colitis, unspecified with unspecified complications: Secondary | ICD-10-CM

## 2019-03-06 DIAGNOSIS — R1084 Generalized abdominal pain: Secondary | ICD-10-CM

## 2019-03-06 LAB — COMPREHENSIVE METABOLIC PANEL
ALT: 8 U/L (ref 0–35)
AST: 11 U/L (ref 0–37)
Albumin: 4.1 g/dL (ref 3.5–5.2)
Alkaline Phosphatase: 52 U/L (ref 39–117)
BUN: 11 mg/dL (ref 6–23)
CO2: 28 mEq/L (ref 19–32)
Calcium: 9.3 mg/dL (ref 8.4–10.5)
Chloride: 103 mEq/L (ref 96–112)
Creatinine, Ser: 0.81 mg/dL (ref 0.40–1.20)
GFR: 82.66 mL/min (ref >60.00)
Glucose, Bld: 120 mg/dL — ABNORMAL HIGH (ref 70–99)
Potassium: 3.5 mEq/L (ref 3.5–5.1)
Sodium: 139 mEq/L (ref 135–145)
Total Bilirubin: 0.3 mg/dL (ref 0.2–1.2)
Total Protein: 6.8 g/dL (ref 6.0–8.3)

## 2019-03-06 LAB — CBC WITH DIFFERENTIAL/PLATELET
Basophils Absolute: 0.1 10*3/uL (ref 0.0–0.1)
Basophils Relative: 0.6 % (ref 0.0–3.0)
Eosinophils Absolute: 0.1 10*3/uL (ref 0.0–0.7)
Eosinophils Relative: 1.2 % (ref 0.0–5.0)
HCT: 40.2 % (ref 36.0–46.0)
Hemoglobin: 13 g/dL (ref 12.0–15.0)
Lymphocytes Relative: 14.1 % (ref 12.0–46.0)
Lymphs Abs: 1.3 10*3/uL (ref 0.7–4.0)
MCHC: 32.3 g/dL (ref 30.0–36.0)
MCV: 79.8 fl (ref 78.0–100.0)
Monocytes Absolute: 0.6 10*3/uL (ref 0.1–1.0)
Monocytes Relative: 6.4 % (ref 3.0–12.0)
Neutro Abs: 7 10*3/uL (ref 1.4–7.7)
Neutrophils Relative %: 77.7 % — ABNORMAL HIGH (ref 43.0–77.0)
Platelets: 353 10*3/uL (ref 150.0–400.0)
RBC: 5.04 Mil/uL (ref 3.87–5.11)
RDW: 19.1 % — ABNORMAL HIGH (ref 11.5–15.5)
WBC: 9 10*3/uL (ref 4.0–10.5)

## 2019-03-06 LAB — HIGH SENSITIVITY CRP: CRP, High Sensitivity: 13.49 mg/L — ABNORMAL HIGH (ref 0.000–5.000)

## 2019-03-06 NOTE — Progress Notes (Signed)
HISTORY OF PRESENT ILLNESS:  Monica Ortega is a 28 y.o. female who presents today for follow-up regarding management of her severe refractory universal ulcerative colitis.  Summary as follows:  1.  Abrupt onset of acute colitis symptoms March 2020.  CT scan at that time revealed pancolitis.  Colonoscopy to the descending colon revealed severe ulcerative colitis with biopsy changes consistent with the same.  No evidence for infection.  Placed on mesalamine and steroids. 2.  Persistent symptoms and fever requiring hospitalization April 2020.  CT scan again revealed pancolitis.  Repeat limited colonoscopy revealed acute colitis.  Biopsies consistent with chronic UC.  No evidence for infection.  Begin Remicade therapy Found to have acute DVT in the left common and internal iliac veins as well as the right peroneal vein and left posterior tibial vein.  She was anticoagulated.  For bleeding had a IVC filter placed. 3.  Persistent symptoms despite medical therapies requiring hospitalization most recently June 18 through January 03, 2019.  Prednisone was increased to high-dose prednisone.  Imuran started.  Remicade provided at 10 mg/kg.  CT scan again with pancolitis.  No evidence for infection.  Developed significant and persistently elevated blood sugars for which she is now on Jardiance.  Discharge blood work included white blood cell count 6.4, hemoglobin 8.6, MCV 79, glucose 241.  She was last evaluated via telehealth medicine January 18, 2019.  See that dictation for details.  She was slowly improving.  She was instructed to taper prednisone, continue on Imuran, and proceed with Remicade 10 mg/kg infusion on July 31.  She did all of these.  Her blood work at that time was improved.  Hemoglobin 12.9.  Elevated C-reactive protein at 23.  Unremarkable liver tests and basic chemistries except for elevated glucose of 195.  In late July she had her vena cava filter removed.  She continues Eliquis.  Prednisone 10 mg  every other day.  Overall she is feeling much better.  Occasional lower abdominal discomfort before defecation in the morning.  Now describing 1 or 2 bowel movements per day which are either soft or loose.  No nocturnal symptoms.  Occasional urgency.  She is increasing her activity.  Appetite is good.  She has gained back a few pounds near her baseline weight.  Dr. Maudie Mercury is managing her steroid related diabetes.  Blood sugars are ranging from mid 90s to low 100s.  She is on Jardiance.  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  No new complaints.  REVIEW OF SYSTEMS:  All non-GI ROS negative except for lightheadedness with standing up quickly  Past Medical History:  Diagnosis Date  . Ankle sprain   . Anxiety   . Colitis   . Depression    denies hospitalization or medication  . DVT (deep venous thrombosis) ([place]) 10/31/2018  . DVT (deep venous thrombosis) ([place])   . Hyponatremia   . Universal ulcerative colitis with rectal bleeding ([place]) 10/31/2018    Past Surgical History:  Procedure Laterality Date  . BIOPSY  10/01/2018   Procedure: BIOPSY;  Surgeon: Doran Stabler, MD;  Location: [place];  Service: Endoscopy;;  . BIOPSY  11/01/2018   Procedure: BIOPSY;  Surgeon: Gatha Mayer, MD;  Location: [place];  Service: Endoscopy;;  . COLONOSCOPY WITH PROPOFOL N/A 10/01/2018   Procedure: COLONOSCOPY WITH PROPOFOL;  Surgeon: Doran Stabler, MD;  Location: [place];  Service: Endoscopy;  Laterality: N/A;  . COLONOSCOPY WITH PROPOFOL N/A 11/01/2018  Procedure: COLONOSCOPY WITH PROPOFOL;  Surgeon: Gatha Mayer, MD;  Location: [facility] ENDOSCOPY;  Service: Endoscopy;  Laterality: N/A;  . IVC FILTER REMOVAL N/A 02/06/2019   Procedure: IVC FILTER REMOVAL;  Surgeon: Waynetta Sandy, MD;  Location: [place] [facility];  Service: Cardiovascular;  Laterality: N/A;  . VENA CAVA FILTER PLACEMENT N/A 10/31/2018   Procedure: INSERTION VENA-CAVA FILTER;  Surgeon:  Rosetta Posner, MD;  Location: [place];  Service: Vascular;  Laterality: N/A;  . WISDOM TOOTH EXTRACTION      Social History Monica Ortega  reports that she has never smoked. She has never used smokeless tobacco. She reports that she does not drink alcohol or use drugs.  family history includes Diabetes in her mother; Heart disease in her maternal aunt; Hypertension in her father.  No Known Allergies     PHYSICAL EXAMINATION: Vital signs: BP 108/82 (BP Location: Left Arm, Patient Position: Sitting, Cuff Size: Normal)   Pulse (!) 104   Temp 98.3 F (36.8 C)   Ht 5' 3.5" (1.613 m) Comment: height measured without shoes  Wt 139 lb (63 kg)   LMP 02/20/2019   BMI 24.24 kg/m   Constitutional: generally well-appearing, no acute distress Psychiatric: alert and oriented x3, cooperative Eyes: extraocular movements intact, anicteric, conjunctiva pink Mouth: oral pharynx moist, no lesions Neck: supple no lymphadenopathy Cardiovascular: heart regular rate and rhythm, no murmur Lungs: clear to auscultation bilaterally Abdomen: soft, nontender, nondistended, no obvious ascites, no peritoneal signs, normal bowel sounds, no organomegaly Rectal: Omitted Extremities: no clubbing, cyanosis, or lower extremity edema bilaterally Skin: no lesions on visible extremities Neuro: No focal deficits.  Cranial nerves intact  ASSESSMENT:  1.  Severe universal ulcerative colitis which has been difficult to come under control but with much improvement on current medical regimen at this time. 2.  Iron deficiency anemia secondary to ulcerative colitis.  Resolved 3.  History of DVT without PE on Eliquis.  Previous IVC filter 4.  Steroid-induced diabetes being followed closely by Dr. Maudie Mercury   PLAN:  1.  Complete prednisone taper this week 2.  Continue Imuran 50 mg daily 3.  Continue Remicade infusions 10 mg/kg every 8 weeks 4.  Blood work today including CBC, comprehensive metabolic panel, and C-reactive  protein 5.  GI office follow-up 2 months.  Contact the office in the interim for any questions or problems. 25-minute spent face-to-face with patient.  Greater than 50% of the time used for counseling regarding her severe ulcerative colitis, its management, and coordination of care plan.

## 2019-03-06 NOTE — Patient Instructions (Signed)
Your provider has requested that you go to the basement level for lab work before leaving today. Press "B" on the elevator. The lab is located at the first door on the left as you exit the elevator.  Please call back in a few weeks to schedule a follow up for the first of November.

## 2019-03-07 ENCOUNTER — Other Ambulatory Visit: Payer: BC Managed Care – PPO

## 2019-03-07 DIAGNOSIS — Z111 Encounter for screening for respiratory tuberculosis: Secondary | ICD-10-CM

## 2019-03-10 LAB — QUANTIFERON-TB GOLD PLUS
Mitogen-NIL: 9.6 IU/mL
NIL: 0.07 IU/mL
QuantiFERON-TB Gold Plus: NEGATIVE
TB1-NIL: <0 IU/mL
TB2-NIL: 0 IU/mL

## 2019-03-11 ENCOUNTER — Encounter: Payer: Self-pay | Admitting: Family Medicine

## 2019-03-19 ENCOUNTER — Encounter: Payer: Self-pay | Admitting: Family Medicine

## 2019-03-21 ENCOUNTER — Other Ambulatory Visit: Payer: Self-pay

## 2019-03-21 DIAGNOSIS — K51919 Ulcerative colitis, unspecified with unspecified complications: Secondary | ICD-10-CM

## 2019-03-22 ENCOUNTER — Other Ambulatory Visit: Payer: BC Managed Care – PPO

## 2019-03-22 DIAGNOSIS — K51919 Ulcerative colitis, unspecified with unspecified complications: Secondary | ICD-10-CM | POA: Diagnosis not present

## 2019-03-26 ENCOUNTER — Ambulatory Visit (INDEPENDENT_AMBULATORY_CARE_PROVIDER_SITE_OTHER): Payer: BC Managed Care – PPO | Admitting: Psychology

## 2019-03-26 DIAGNOSIS — F4010 Social phobia, unspecified: Secondary | ICD-10-CM | POA: Diagnosis not present

## 2019-03-26 DIAGNOSIS — F341 Dysthymic disorder: Secondary | ICD-10-CM | POA: Diagnosis not present

## 2019-03-27 ENCOUNTER — Other Ambulatory Visit: Payer: Self-pay

## 2019-03-27 MED ORDER — AZATHIOPRINE 50 MG PO TABS: 50.0000 mg | ORAL_TABLET | Freq: Every day | ORAL | 3 refills | Status: DC

## 2019-03-30 LAB — SERIAL MONITORING

## 2019-04-01 ENCOUNTER — Telehealth: Payer: Self-pay | Admitting: Internal Medicine

## 2019-04-01 DIAGNOSIS — K5190 Ulcerative colitis, unspecified, without complications: Secondary | ICD-10-CM | POA: Diagnosis not present

## 2019-04-01 LAB — INFLIXIMAB+AB (SERIAL MONITOR)
Anti-Infliximab Antibody: <22 ng/mL
Infliximab Drug Level: 9.9 ug/mL

## 2019-04-01 NOTE — Telephone Encounter (Signed)
Remicade order was faxed on 03/25/19 for pt to receive Remicade 68m/kg every 6 weeks.

## 2019-04-05 ENCOUNTER — Encounter: Payer: Self-pay | Admitting: Family Medicine

## 2019-04-14 ENCOUNTER — Other Ambulatory Visit: Payer: Self-pay | Admitting: Family Medicine

## 2019-04-15 ENCOUNTER — Other Ambulatory Visit (INDEPENDENT_AMBULATORY_CARE_PROVIDER_SITE_OTHER): Payer: BC Managed Care – PPO

## 2019-04-15 ENCOUNTER — Other Ambulatory Visit: Payer: Self-pay

## 2019-04-15 DIAGNOSIS — K51919 Ulcerative colitis, unspecified with unspecified complications: Secondary | ICD-10-CM

## 2019-04-15 DIAGNOSIS — R197 Diarrhea, unspecified: Secondary | ICD-10-CM

## 2019-04-15 DIAGNOSIS — K51011 Ulcerative (chronic) pancolitis with rectal bleeding: Secondary | ICD-10-CM

## 2019-04-15 LAB — COMPREHENSIVE METABOLIC PANEL
ALT: 5 U/L (ref 0–35)
AST: 9 U/L (ref 0–37)
Albumin: 3.7 g/dL (ref 3.5–5.2)
Alkaline Phosphatase: 63 U/L (ref 39–117)
BUN: 9 mg/dL (ref 6–23)
CO2: 29 mEq/L (ref 19–32)
Calcium: 9.3 mg/dL (ref 8.4–10.5)
Chloride: 104 mEq/L (ref 96–112)
Creatinine, Ser: 0.7 mg/dL (ref 0.40–1.20)
GFR: 97.76 mL/min (ref >60.00)
Glucose, Bld: 106 mg/dL — ABNORMAL HIGH (ref 70–99)
Potassium: 4.4 mEq/L (ref 3.5–5.1)
Sodium: 139 mEq/L (ref 135–145)
Total Bilirubin: 0.3 mg/dL (ref 0.2–1.2)
Total Protein: 6.9 g/dL (ref 6.0–8.3)

## 2019-04-15 LAB — CBC WITH DIFFERENTIAL/PLATELET
Basophils Absolute: 0.1 10*3/uL (ref 0.0–0.1)
Basophils Relative: 1.1 % (ref 0.0–3.0)
Eosinophils Absolute: 0.4 10*3/uL (ref 0.0–0.7)
Eosinophils Relative: 6.6 % — ABNORMAL HIGH (ref 0.0–5.0)
HCT: 36.9 % (ref 36.0–46.0)
Hemoglobin: 12 g/dL (ref 12.0–15.0)
Lymphocytes Relative: 30.1 % (ref 12.0–46.0)
Lymphs Abs: 1.6 10*3/uL (ref 0.7–4.0)
MCHC: 32.4 g/dL (ref 30.0–36.0)
MCV: 78.6 fl (ref 78.0–100.0)
Monocytes Absolute: 0.6 10*3/uL (ref 0.1–1.0)
Monocytes Relative: 10.9 % (ref 3.0–12.0)
Neutro Abs: 2.8 10*3/uL (ref 1.4–7.7)
Neutrophils Relative %: 51.3 % (ref 43.0–77.0)
Platelets: 333 10*3/uL (ref 150.0–400.0)
RBC: 4.7 Mil/uL (ref 3.87–5.11)
RDW: 14.9 % (ref 11.5–15.5)
WBC: 5.5 10*3/uL (ref 4.0–10.5)

## 2019-04-15 LAB — C-REACTIVE PROTEIN: CRP: 3 mg/dL (ref 0.5–20.0)

## 2019-04-16 ENCOUNTER — Other Ambulatory Visit: Payer: Self-pay

## 2019-04-16 ENCOUNTER — Ambulatory Visit: Payer: BC Managed Care – PPO | Admitting: Internal Medicine

## 2019-04-16 ENCOUNTER — Encounter: Payer: Self-pay | Admitting: Internal Medicine

## 2019-04-16 ENCOUNTER — Other Ambulatory Visit: Payer: BC Managed Care – PPO

## 2019-04-16 VITALS — BP 104/80 | HR 96 | Temp 97.8°F | Ht 63.5 in | Wt 137.5 lb

## 2019-04-16 DIAGNOSIS — R197 Diarrhea, unspecified: Secondary | ICD-10-CM

## 2019-04-16 DIAGNOSIS — K51919 Ulcerative colitis, unspecified with unspecified complications: Secondary | ICD-10-CM

## 2019-04-16 DIAGNOSIS — K5190 Ulcerative colitis, unspecified, without complications: Secondary | ICD-10-CM

## 2019-04-16 MED ORDER — NA SULFATE-K SULFATE-MG SULF 17.5-3.13-1.6 GM/177ML PO SOLN: 1.0000 | Freq: Once | ORAL | 0 refills | Status: AC

## 2019-04-16 NOTE — Progress Notes (Signed)
HISTORY OF PRESENT ILLNESS:  Monica Ortega is a 28 y.o. female with severe refractory universal ulcerative colitis who presents today for follow-up reporting a worsening of her condition since her last Remicade infusion about 2 weeks ago.  The summary of her history is as follows:  1. Abrupt onset of acute colitis symptoms March 2020. CT scan at that time revealed pancolitis. Colonoscopy to the descending colon revealed severe ulcerative colitis with biopsy changes consistent with the same. No evidence for infection. Placed on mesalamine and steroids. 2. Persistent symptoms and fever requiring hospitalization April 2020. CT scan again revealed pancolitis. Repeat limited colonoscopy revealed acute colitis. Biopsies consistent with chronic UC. No evidence for infection. Begin Remicade therapy Found to have acute DVT in the left common and internal iliac veins as well as the right peroneal vein and left posterior tibial vein. She was anticoagulated. For bleeding had a IVC filter placed. 3. Persistent symptoms despite medical therapies requiring hospitalization most recently June 18 through January 03, 2019. Prednisone was increased to high-dose prednisone. Imuran started. Remicade provided at 10 mg/kg. CT scan again with pancolitis. No evidence for infection. Developed significant and persistently elevated blood sugars for which she is now on Jardiance. Discharge blood work included white blood cell count 6.4, hemoglobin 8.6, MCV 79, glucose 241.  I last saw the patient in the office March 06, 2019.  At that time she was doing better.  Blood work markedly improved.  She was tapered off prednisone.  She has continued on Imuran 50 mg daily and Remicade infusions 10 mg/kg every 8 weeks.  She did contact the office thereafter with breakthrough symptoms for which her Remicade infusion was moved up 1 week.  We did obtain drug and antibody levels 6 weeks after her previous infusion.  No  antibody levels.  Drug levels were the lower end of adequate.  Despite receiving her last Remicade infusion 10 mg/kg IV on April 01, 2019 she reports worsening symptoms since.  Her bowel habits are soft or loose and more frequent with some urgency.  Some abdominal cramping pain.  Past few days she has noticed a little bit of blood and mucus.  No fevers.  She also describes fatigue and generalized stiffness.  No rash.  No joint swelling.  She is not needing antidiarrheals.  She does use dicyclomine for abdominal discomfort as needed.  She continues on Eliquis for her history of DVT.  She does have an IVC filter.  She did undergo blood work yesterday.  CBC, comprehensive metabolic panel, and C-reactive protein were normal.  Stool for C. difficile is pending  REVIEW OF SYSTEMS:  All non-GI ROS negative unless otherwise stated in the HPI except for fatigue  Past Medical History:  Diagnosis Date  . Ankle sprain   . Anxiety   . Colitis   . Depression    denies hospitalization or medication  . DVT (deep venous thrombosis) ([place]) 10/31/2018  . DVT (deep venous thrombosis) ([place])   . Hyponatremia   . Universal ulcerative colitis with rectal bleeding ([place]) 10/31/2018    Past Surgical History:  Procedure Laterality Date  . BIOPSY  10/01/2018   Procedure: BIOPSY;  Surgeon: Doran Stabler, MD;  Location: [place];  Service: Endoscopy;;  . BIOPSY  11/01/2018   Procedure: BIOPSY;  Surgeon: Gatha Mayer, MD;  Location: [place];  Service: Endoscopy;;  . COLONOSCOPY WITH PROPOFOL N/A 10/01/2018   Procedure: COLONOSCOPY WITH PROPOFOL;  Surgeon: Doran Stabler, MD;  [facility];  Service: Endoscopy;  Laterality: N/A;  . COLONOSCOPY WITH PROPOFOL N/A 11/01/2018   Procedure: COLONOSCOPY WITH PROPOFOL;  Surgeon: Gatha Mayer, MD;  Location: [facility] ENDOSCOPY;  Service: Endoscopy;  Laterality: N/A;  . IVC FILTER REMOVAL N/A 02/06/2019   Procedure: IVC FILTER REMOVAL;  Surgeon: Waynetta Sandy, MD;  Location: [place] CV LAB;  Service: Cardiovascular;  Laterality: N/A;  . VENA CAVA FILTER PLACEMENT N/A 10/31/2018   Procedure: INSERTION VENA-CAVA FILTER;  Surgeon: Rosetta Posner, MD;  Location: [place];  Service: Vascular;  Laterality: N/A;  . WISDOM TOOTH EXTRACTION      Social History Gerldine Suleiman  reports that she has never smoked. She has never used smokeless tobacco. She reports that she does not drink alcohol or use drugs.  family history includes Diabetes in her mother; Heart disease in her maternal aunt; Hypertension in her father.  No Known Allergies     PHYSICAL EXAMINATION: Vital signs: BP 104/80 (BP Location: Left Arm, Patient Position: Sitting, Cuff Size: Normal)   Pulse 96   Temp 97.8 F (36.6 C)   Ht 5' 3.5" (1.613 m)   Wt 137 lb 8 oz (62.4 kg)   LMP 04/10/2019   BMI 23.97 kg/m   Constitutional: generally well-appearing, no acute distress Psychiatric: alert and oriented x3, cooperative Eyes: extraocular movements intact, anicteric, conjunctiva pink Mouth: oral pharynx moist, no lesions Neck: supple no lymphadenopathy Cardiovascular: heart regular rate and rhythm, no murmur Lungs: clear to auscultation bilaterally Abdomen: soft, nontender, nondistended, no obvious ascites, no peritoneal signs, normal bowel sounds, no organomegaly Rectal: Deferred until colonoscopy Extremities: no clubbing, cyanosis, or lower extremity edema bilaterally Skin: no lesions on visible extremities Neuro: No focal deficits.  Cranial nerves intact  ASSESSMENT:  1.  History of severe refractory universal ulcerative colitis.  Seems to be having breakthrough disease at this time despite recent Remicade infusion as described.  She should have adequate drug levels at this time.  She is also on Imuran.   PLAN:  1.  Follow-up stool studies for C. Difficile 2.  Schedule colonoscopy with biopsies to assess the state of her colitis.  Rule out confounding  variables such as infection.  If it is apparent that she is a nonresponder to Remicade then we would need to pivot to a different biologic such as Entyvio.  She may need a short course of steroids.  She still understands that surgery is an option.  The patient is high risk given her colitis and history of DVT.The nature of the procedure, as well as the risks, benefits, and alternatives were carefully and thoroughly reviewed with the patient. Ample time for discussion and questions allowed. The patient understood, was satisfied, and agreed to proceed. 3.  Hold Eliquis the day before the procedure.  Would anticipate resuming immediately after the procedure with biopsies completed 25-minute spent face-to-face with the patient.  Greater than 50% the time used for counseling regarding her complicated colitis, management strategy, work-up, and coordination of care plan

## 2019-04-16 NOTE — Patient Instructions (Signed)

## 2019-04-18 LAB — SPECIMEN STATUS REPORT

## 2019-04-22 ENCOUNTER — Ambulatory Visit (AMBULATORY_SURGERY_CENTER): Payer: BC Managed Care – PPO | Admitting: Internal Medicine

## 2019-04-22 ENCOUNTER — Other Ambulatory Visit: Payer: Self-pay

## 2019-04-22 ENCOUNTER — Other Ambulatory Visit: Payer: Self-pay | Admitting: Internal Medicine

## 2019-04-22 ENCOUNTER — Encounter: Payer: Self-pay | Admitting: Internal Medicine

## 2019-04-22 VITALS — BP 121/71 | HR 93 | Temp 98.3°F | Resp 19 | Ht 63.0 in | Wt 137.0 lb

## 2019-04-22 DIAGNOSIS — R197 Diarrhea, unspecified: Secondary | ICD-10-CM

## 2019-04-22 DIAGNOSIS — K5150 Left sided colitis without complications: Secondary | ICD-10-CM | POA: Diagnosis not present

## 2019-04-22 DIAGNOSIS — K529 Noninfective gastroenteritis and colitis, unspecified: Secondary | ICD-10-CM | POA: Diagnosis not present

## 2019-04-22 DIAGNOSIS — K625 Hemorrhage of anus and rectum: Secondary | ICD-10-CM

## 2019-04-22 DIAGNOSIS — K51919 Ulcerative colitis, unspecified with unspecified complications: Secondary | ICD-10-CM

## 2019-04-22 DIAGNOSIS — K5190 Ulcerative colitis, unspecified, without complications: Secondary | ICD-10-CM

## 2019-04-22 MED ORDER — DEXTROSE 5 % IV SOLN: INTRAVENOUS | Status: DC

## 2019-04-22 MED ORDER — SODIUM CHLORIDE 0.9 % IV SOLN: 500.0000 mL | Freq: Once | INTRAVENOUS | Status: DC

## 2019-04-22 MED ORDER — PREDNISONE 10 MG PO TABS: 40.0000 mg | ORAL_TABLET | Freq: Every day | ORAL | 1 refills | Status: DC

## 2019-04-22 NOTE — Progress Notes (Signed)
Called to room to assist during endoscopic procedure.  Patient ID and intended procedure confirmed with present staff. Received instructions for my participation in the procedure from the performing physician.  

## 2019-04-22 NOTE — Patient Instructions (Signed)
YOU HAD AN ENDOSCOPIC PROCEDURE TODAY AT [facility]:   Refer to the procedure report that was given to you for any specific questions about what was found during the examination.  If the procedure report does not answer your questions, please call your gastroenterologist to clarify.  If you requested that your care partner not be given the details of your procedure findings, then the procedure report has been included in a sealed envelope for you to review at your convenience later.  YOU SHOULD EXPECT: Some feelings of bloating in the abdomen. Passage of more gas than usual.  Walking can help get rid of the air that was put into your GI tract during the procedure and reduce the bloating. If you had a lower endoscopy (such as a colonoscopy or flexible sigmoidoscopy) you may notice spotting of blood in your stool or on the toilet paper. If you underwent a bowel prep for your procedure, you may not have a normal bowel movement for a few days.  Please Note:  You might notice some irritation and congestion in your nose or some drainage.  This is from the oxygen used during your procedure.  There is no need for concern and it should clear up in a day or so.  SYMPTOMS TO REPORT IMMEDIATELY:   Following lower endoscopy (colonoscopy or flexible sigmoidoscopy):  Excessive amounts of blood in the stool  Significant tenderness or worsening of abdominal pains  Swelling of the abdomen that is new, acute  Fever of 100F or higher  For urgent or emergent issues, a gastroenterologist can be reached at any hour by calling [phone].   DIET:  We do recommend a small meal at first, but then you may proceed to your regular diet.  Drink plenty of fluids but you should avoid alcoholic beverages for 24 hours.  ACTIVITY:  You should plan to take it easy for the rest of today and you should NOT DRIVE or use heavy machinery until tomorrow (because of the sedation medicines used during the test).     FOLLOW UP: Our staff will call the number listed on your records 48-72 hours following your procedure to check on you and address any questions or concerns that you may have regarding the information given to you following your procedure. If we do not reach you, we will leave a message.  We will attempt to reach you two times.  During this call, we will ask if you have developed any symptoms of COVID 19. If you develop any symptoms (ie: fever, flu-like symptoms, shortness of breath, cough etc.) before then, please call [phone].  If you test positive for Covid 19 in the 2 weeks post procedure, please call and report this information to [place].    If any biopsies were taken you will be contacted by phone or by letter within the next 1-3 weeks.  Please call us at [phone] if you have not heard about the biopsies in 3 weeks.    SIGNATURES/CONFIDENTIALITY: You and/or your care partner have signed paperwork which will be entered into your electronic medical record.  These signatures attest to the fact that that the information above on your After Visit Summary has been reviewed and is understood.  Full responsibility of the confidentiality of this discharge information lies with you and/or your care-partner.

## 2019-04-22 NOTE — Op Note (Signed)
[facility] Patient Name: Monica Ortega Procedure Date: 04/22/2019 3:28 PM MRN: [id number] Endoscopist: Docia Chuck. Henrene Pastor , MD Age: 28 Referring MD:  Date of Birth: [birth year] Gender: Female Account #: [id number] Procedure:                Colonoscopy with biopsies Indications:              For therapy of chronic ulcerative pancolitis,                            Disease activity assessment of chronic ulcerative                            pancolitis, Assess therapeutic response to therapy                            of chronic ulcerative pancolitis. Currently on                            Remicade and Imuran Medicines:                Monitored Anesthesia Care Procedure:                Pre-Anesthesia Assessment:                           - Prior to the procedure, a History and Physical                            was performed, and patient medications and                            allergies were reviewed. The patient's tolerance of                            previous anesthesia was also reviewed. The risks                            and benefits of the procedure and the sedation                            options and risks were discussed with the patient.                            All questions were answered, and informed consent                            was obtained. Prior Anticoagulants: The patient has                            taken no previous anticoagulant or antiplatelet                            agents. ASA Grade Assessment: II - A patient with  mild systemic disease. After reviewing the risks                            and benefits, the patient was deemed in                            satisfactory condition to undergo the procedure.                           After obtaining informed consent, the colonoscope                            was passed under direct vision. Throughout the                            procedure, the patient's blood  pressure, pulse, and                            oxygen saturations were monitored continuously. The                            Colonoscope was introduced through the anus and                            advanced to the the cecum, identified by                            appendiceal orifice and ileocecal valve. The                            terminal ileum, ileocecal valve, appendiceal                            orifice, and rectum were photographed. The quality                            of the bowel preparation was excellent. The                            colonoscopy was performed without difficulty. The                            patient tolerated the procedure well. The bowel                            preparation used was SUPREP via split dose                            instruction. Scope In: 3:36:46 PM Scope Out: 3:50:24 PM Scope Withdrawal Time: 0 hours 10 minutes 30 seconds  Total Procedure Duration: 0 hours 13 minutes 38 seconds  Findings:                 The terminal ileum appeared normal.  The cecum, right colon, and transverse colon were                            characterized by scarring and multiple pseudopolyps                            without significant active inflammation. There was                            loss of the vascular pattern as well as the                            haustral pattern. The left colon also had scarring                            and pseudopolyps. However, the left colon also had                            scattered ulceration and edema deemed to be                            moderate in extent. Biopsies were taken from the                            right colon and left colon. No attempted                            retroflexion due to narrow rectal vault. Complications:            No immediate complications. Estimated blood loss:                            None. Estimated Blood Loss:     Estimated blood loss:  none. Impression:               - The examined portion of the ileum was normal.                           - Pancolitis. Inflammation was found in the left                            colon. This was moderate in severity. Biopsied. Recommendation:           1. Follow-up biopsies                           2. Prescribe prednisone 40 mg daily; dispense 20 mg                            tabs; #60; 1 refill                           3. Office follow-up with Dr. Henrene Pastor in 2 weeks  4. Anticipate transition to [facility] as well as                            referral to IBD surgeon for introductory                            discussions, should surgery be necessary. Docia Chuck. Henrene Pastor, MD 04/22/2019 4:03:44 PM This report has been signed electronically.

## 2019-04-22 NOTE — Progress Notes (Signed)
Temp JB V/S Dickens I have reviewed the patient's medical history in detail and updated the computerized patient record.   notified Dr. Henrene Pastor of CBG 60 , D5W infusing.

## 2019-04-22 NOTE — Progress Notes (Signed)
Report to PACU, RN, vss, BBS= Clear.  

## 2019-04-24 ENCOUNTER — Telehealth: Payer: Self-pay

## 2019-04-24 LAB — CLOSTRIDIUM DIFFICILE BY PCR: Toxigenic C. Difficile by PCR: NEGATIVE

## 2019-04-24 NOTE — Telephone Encounter (Signed)
  Follow up Call-  Call back number 04/22/2019  Post procedure Call Back phone  # [phone]  Permission to leave phone message Yes  Some recent data might be hidden     Patient questions:  Do you have a fever, pain , or abdominal swelling? No. Pain Score  0 *  Have you tolerated food without any problems? Yes.    Have you been able to return to your normal activities? Yes.    Do you have any questions about your discharge instructions: Diet   No. Medications  No. Follow up visit  No.  Do you have questions or concerns about your Care? No.  Actions: * If pain score is 4 or above: No action needed, pain <4.  1. Have you developed a fever since your procedure? no  2.   Have you had an respiratory symptoms (SOB or cough) since your procedure? no  3.   Have you tested positive for COVID 19 since your procedure no  4.   Have you had any family members/close contacts diagnosed with the COVID 19 since your procedure?  no   If yes to any of these questions please route to Joylene John, RN and Alphonsa Gin, Therapist, sports.

## 2019-04-25 ENCOUNTER — Telehealth: Payer: Self-pay | Admitting: Family Medicine

## 2019-04-25 NOTE — Telephone Encounter (Signed)
See routing note.

## 2019-04-30 ENCOUNTER — Encounter: Payer: Self-pay | Admitting: Internal Medicine

## 2019-05-06 ENCOUNTER — Other Ambulatory Visit: Payer: Self-pay

## 2019-05-06 ENCOUNTER — Encounter: Payer: Self-pay | Admitting: Internal Medicine

## 2019-05-06 ENCOUNTER — Ambulatory Visit: Payer: BC Managed Care – PPO | Admitting: Internal Medicine

## 2019-05-06 VITALS — BP 108/78 | HR 93 | Temp 98.1°F | Ht 63.5 in | Wt 139.0 lb

## 2019-05-06 DIAGNOSIS — K5190 Ulcerative colitis, unspecified, without complications: Secondary | ICD-10-CM

## 2019-05-06 DIAGNOSIS — K51919 Ulcerative colitis, unspecified with unspecified complications: Secondary | ICD-10-CM | POA: Diagnosis not present

## 2019-05-06 MED ORDER — DICYCLOMINE HCL 10 MG PO CAPS: 10.0000 mg | ORAL_CAPSULE | Freq: Three times a day (TID) | ORAL | 1 refills | Status: DC

## 2019-05-06 MED ORDER — PREDNISONE 10 MG PO TABS: ORAL_TABLET | ORAL | 1 refills | Status: DC

## 2019-05-06 NOTE — Patient Instructions (Addendum)
We have sent the following medications to your pharmacy for you to pick up at your convenience:  Bentyl, Prednisone  [name] will contact you regarding starting Entyvio and stopping Remicade.  You will receive a call from [facility] about a surgical referral  Please follow up on 124/17/2020 at 9:20am

## 2019-05-06 NOTE — Progress Notes (Signed)
HISTORY OF PRESENT ILLNESS:  Monica Ortega is a 28 y.o. female with severe refractory universal ulcerative colitis who presents today for follow-up after having undergone colonoscopy to evaluate worsening colitis symptoms after tapering off steroids despite adequate Remicade drug levels with negative antibodies.  Her last office evaluation was April 16, 2019.  See that dictation.  The summary of her history is as follows:  1. Abrupt onset of acute colitis symptoms March 2020. CT scan at that time revealed pancolitis. Colonoscopy to the descending colon revealed severe ulcerative colitis with biopsy changes consistent with the same. No evidence for infection. Placed on mesalamine and steroids. 2. Persistent symptoms and fever requiring hospitalization April 2020. CT scan again revealed pancolitis. Repeat limited colonoscopy revealed acute colitis. Biopsies consistent with chronic UC. No evidence for infection. Begin Remicade therapy Found to have acute DVT in the left common and internal iliac veins as well as the right peroneal vein and left posterior tibial vein. She was anticoagulated. For bleeding had a IVC filter placed. 3. Persistent symptoms despite medical therapies requiring hospitalization most recently June 18 through January 03, 2019. Prednisone was increased to high-dose prednisone. Imuran started. Remicade provided at 10 mg/kg. CT scan again with pancolitis. No evidence for infection. Developed significant and persistently elevated blood sugars for which she is now on Jardiance. Discharge blood work included white blood cell count 6.4, hemoglobin 8.6, MCV 79, glucose 241.  Blood work at the time of her last visit was unremarkable including comprehensive metabolic panel, CBC, and C-reactive protein.  Her most recent colonoscopy was performed April 22, 2019.  This revealed pancolitis with chronic active disease throughout but moderate to severe disease in the left  colon.  See that report.  Biopsies did not show any other issues.  Prior to colonoscopy she had been on Remicade 10 mg/kg every 7 or 8 weeks.  As well Imuran 50 mg daily.  Post colonoscopy she resumed prednisone 40 mg daily with follow-up at this time.  Since her colonoscopy she is doing much better on prednisone therapy.  Currently describes 1 or 2 bowel movements per day with no nocturnal component.  Much better formed.  No bleeding or mucus.  No significant abdominal pain.  Eating well.  Increased activity.  No new complaints.  We did discuss arranging a surgical opinion regarding her disease for the purposes of information and options.  I would like her to see Dr. Renelda Mom at [place]:  All non-GI ROS negative unless otherwise stated in the HPI  Past Medical History:  Diagnosis Date  . Ankle sprain   . Anxiety   . Colitis   . Depression    denies hospitalization or medication  . DVT (deep venous thrombosis) ([place]) 10/31/2018  . DVT (deep venous thrombosis) ([place])   . Hyponatremia   . Universal ulcerative colitis with rectal bleeding ([place]) 10/31/2018    Past Surgical History:  Procedure Laterality Date  . BIOPSY  10/01/2018   Procedure: BIOPSY;  Surgeon: Doran Stabler, MD;  Location: [facility];  Service: Endoscopy;;  . BIOPSY  11/01/2018   Procedure: BIOPSY;  Surgeon: Gatha Mayer, MD;  Location: [facility];  Service: Endoscopy;;  . COLONOSCOPY WITH PROPOFOL N/A 10/01/2018   Procedure: COLONOSCOPY WITH PROPOFOL;  Surgeon: Doran Stabler, MD;  Location: [facility];  Service: Endoscopy;  Laterality: N/A;  . COLONOSCOPY WITH PROPOFOL N/A 11/01/2018   Procedure: COLONOSCOPY WITH PROPOFOL;  Surgeon: Carlean Purl,  Ofilia Neas, MD;  Location: [facility];  Service: Endoscopy;  Laterality: N/A;  . IVC FILTER REMOVAL N/A 02/06/2019   Procedure: IVC FILTER REMOVAL;  Surgeon: Waynetta Sandy, MD;  Location: [facility];  Service:  Cardiovascular;  Laterality: N/A;  . VENA CAVA FILTER PLACEMENT N/A 10/31/2018   Procedure: INSERTION VENA-CAVA FILTER;  Surgeon: Rosetta Posner, MD;  Location: [facility];  Service: Vascular;  Laterality: N/A;  . WISDOM TOOTH EXTRACTION      Social History Monica Ortega  reports that she has never smoked. She has never used smokeless tobacco. She reports that she does not drink alcohol or use drugs.  family history includes Diabetes in her mother; Heart disease in her maternal aunt; Hypertension in her father.  No Known Allergies     PHYSICAL EXAMINATION: Vital signs: BP 108/78   Pulse 93   Temp 98.1 F (36.7 C)   Ht 5' 3.5" (1.613 m)   Wt 139 lb (63 kg)   LMP 04/10/2019   BMI 24.24 kg/m   Constitutional: generally well-appearing, no acute distress Psychiatric: alert and oriented x3, cooperative Eyes: extraocular movements intact, anicteric, conjunctiva pink Mouth: oral pharynx moist, no lesions Neck: supple no lymphadenopathy Cardiovascular: heart regular rate and rhythm, no murmur Lungs: clear to auscultation bilaterally Abdomen: soft, nontender, nondistended, no obvious ascites, no peritoneal signs, normal bowel sounds, no organomegaly Rectal: Omitted Extremities: no clubbing, cyanosis, or lower extremity edema bilaterally Skin: no lesions on visible extremities Neuro: No focal deficits. No asterixis.    ASSESSMENT:  1.  Severe refractory universal ulcerative colitis as outlined above.  She seems to have had an inadequate response to Remicade despite appropriate drug levels and negative antibodies.  Doing better now that prednisone has been reinitiated.  Moderate to severe disease in the left colon as described.  Less severe, but active, disease in the right colon.  At this point I think that we need to transition her medical therapy to a different biologic agent.  I also would like her to have an expert surgical opinion so that she understands the surgical options  regarding her ulcerative colitis.  She is in agreement with all the above.   PLAN:  1.  Stop Remicade 2.  Initiate Entyvio.  Induction series with 300 mg IV at weeks 0, 2, and 6.  300 mg IV every 8 weeks thereafter.  Medication effects and risks reviewed. 3.  Continue prednisone 40 mg daily for an additional 2 weeks, then decrease to 30 mg daily for 2 weeks, then decrease to 20 mg daily until follow-up.  She remains aware of the medication risks 4.  Continue Imuran 50 mg daily.  She remains aware of the medication risks 5.  Arrange surgical appointment with Dr. Renelda Mom, expert IBD colorectal surgeon, to discuss surgical options should they be needed or desired. 6.  GI office follow-up 6 to 8 weeks.  Contact the office in the interim for any questions or problems 25-minute spent face-to-face with the patient.  Greater than 50% of time used for counseling regarding her severe refractory universal ulcerative colitis.

## 2019-05-06 NOTE — Telephone Encounter (Signed)
Scheduled a virtual TOC visit with patient.

## 2019-05-06 NOTE — Progress Notes (Signed)
Orders and OV note faxed to [facility] to [place] and [place]. Colletta Maryland will fax info to obtain PA from [facility]. Pt to keep appt as scheduled for next week and she will receive the first dose of Entyvio. Referral/records faxed to Dr. Renelda Mom at [phone] to the referral coordinator. Pt aware and knows to call back if she has not heard back regarding appt in a week.

## 2019-05-09 ENCOUNTER — Encounter: Payer: Self-pay | Admitting: Family Medicine

## 2019-05-13 DIAGNOSIS — K5190 Ulcerative colitis, unspecified, without complications: Secondary | ICD-10-CM | POA: Diagnosis not present

## 2019-05-22 ENCOUNTER — Encounter: Payer: Self-pay | Admitting: Family Medicine

## 2019-05-22 ENCOUNTER — Other Ambulatory Visit: Payer: Self-pay

## 2019-05-22 ENCOUNTER — Ambulatory Visit (INDEPENDENT_AMBULATORY_CARE_PROVIDER_SITE_OTHER): Payer: BC Managed Care – PPO | Admitting: Family Medicine

## 2019-05-22 DIAGNOSIS — K51919 Ulcerative colitis, unspecified with unspecified complications: Secondary | ICD-10-CM | POA: Diagnosis not present

## 2019-05-22 DIAGNOSIS — I82423 Acute embolism and thrombosis of iliac vein, bilateral: Secondary | ICD-10-CM | POA: Diagnosis not present

## 2019-05-22 NOTE — Progress Notes (Signed)
Virtual Visit via Video Note  I connected with Monica Ortega   on 05/22/19 at  1:30 PM EST by a video enabled telemedicine application and verified that I am speaking with the correct person using two identifiers.  Location patient: home Location provider:work office Persons participating in the virtual visit: patient, provider  I discussed the limitations of evaluation and management by telemedicine and the availability of in person appointments. The patient expressed understanding and agreed to proceed.   Monica Ortega DOB: [birth year] Encounter date: 05/22/2019  This is a 28 y.o. female who presents to establish care. Chief Complaint  Patient presents with  . Establish Care    History of present illness: DVT: on eliquis. Legs are doing well. No cramping. Took filter out; told to stay on anticoagulant for she thinks 6 months.   Ulcerative colitis: following with Dr. Henrene Pastor. Recently referred to Dr. Renelda Mom at [facility] to discuss surgical options. Has had multiple complications this year since march with pancolitis. Right now back on prednisone and new infusion (Entyvio); feeling well back on prednisone. Had another colonoscopy and was told things looked like they were trying to heal, except last portion; but overall better. Currently on 53m daily and decreasing to 248min 2 weeks. More energy and less pain with prednisone. When she was doing remicaid and off prednisone, just didn't work for her and had little relapse with pain/bowels. No blood in stools recently. Still some mucous.   Spot checking fasting glucose. Has been high 80's, low 90's. Not on jardiance now.   Depression: states that overall mood is doing ok. Able to be more productive, which helps. Exercising more, doing more around house. Has had chronic depression for several years. Follows with therapist. Was on medications several years ago, but came off these. Some social anxiety; not generalized.   Elevated  blood sugars with steroids: see above  Periods are a little heavy 5-6 days. On heaviest days changing at least 3 times during day. Or can wear diva cup for whole day. Little clots.   Past Medical History:  Diagnosis Date  . Ankle sprain   . Anxiety   . Colitis   . Depression    denies hospitalization or medication  . D[facility]deep venous thrombosis) ([facility]  . D[facility] (deep venous thrombosis) ([facility]  . Hyponatremia   . Universal ulcerative colitis with rectal bleeding ([facility]   Past Surgical History:  Procedure Laterality Date  . BIOPSY  10/01/2018   Procedure: BIOPSY;  Surgeon: DaDoran StablerMD;  [facility] [facility];;  . BIOPSY  11/01/2018   Procedure: BIOPSY;  Surgeon: GeGatha MayerMD;  [place] [facility] [facility]: [place]oscopy;;  . COLONOSCOPY WITH PROPOFOL N/A 10/01/2018   Procedure: COLONOSCOPY WITH PROPOFOL;  Surgeon: DaDoran StablerMD;  [facility]: Endoscopy;  Laterality: N/A;  . COLONOSCOPY WITH PROPOFOL N/A 11/01/2018   Procedure: COLONOSCOPY WITH PROPOFOL;  Surgeon: GeGatha MayerMD;  [facility];  Service: Endoscopy;  Laterality: N/A;  . IVC FILTER REMOVAL N/A 02/06/2019   Procedure: IVC FILTER REMOVAL;  Surgeon: CaWaynetta SandyMD;  [facility];  Service: Cardiovascular;  Laterality: N/A;  . VENA CAVA FILTER PLACEMENT N/A 10/31/2018   Procedure: INSERTION VENA-CAVA FILTER;  Surgeon: EaRosetta PosnerMD;  Location: [facility] OR;  Service: Vascular;  Laterality: N/A;  . WISDOM TOOTH EXTRACTION     No Known Allergies  Current Meds  Medication Sig  . ACCU-CHEK AVIVA PLUS test strip USE AS DIRECTED  . acetaminophen (ACETAMINOPHEN EXTRA STRENGTH) 500 MG tablet Take 1,000 mg by mouth every 6 (six) hours as needed for headache (pain).   Marland Kitchen apixaban (ELIQUIS) 5 MG TABS tablet 2 tabs (43m) PO BID x 7 days, then 1 tab PO BID (Patient taking differently: Take 5 mg by mouth 2 (two)  times daily. )  . azaTHIOprine (IMURAN) 50 MG tablet Take 1 tablet (50 mg total) by mouth daily.  . blood glucose meter kit and supplies KIT Use as directed  . dicyclomine (BENTYL) 10 MG capsule Take 1 capsule (10 mg total) by mouth 4 (four) times daily -  before meals and at bedtime.  . Lancets MISC 1 each by Does not apply route as directed.  . loperamide (IMODIUM) 2 MG capsule Take 2-4 mg by mouth 2 (two) times daily as needed for diarrhea or loose stools.   . ondansetron (ZOFRAN) 8 MG tablet Take 1 tablet (8 mg total) by mouth every 8 (eight) hours as needed for nausea.  . pantoprazole (PROTONIX) 40 MG tablet Take 1 tablet (40 mg total) by mouth daily.  . predniSONE (DELTASONE) 10 MG tablet Take 414mdaily for two weeks then 3094maily for two weeks, then 40m75mily until next appointment  . promethazine (PHENERGAN) 25 MG tablet Take 1 tablet (25 mg total) by mouth every 6 (six) hours as needed for nausea or vomiting.  . Simethicone (GAS-X PO) Take 1 capsule by mouth daily as needed (flatulence).  . SRONYX 0.1-20 MG-MCG tablet Take 1 tablet by mouth daily.   Current Facility-Administered Medications for the 05/22/19 encounter (Office Visit) with KobeCaren Macadam  Medication  . dextrose 5 % solution   Social History   Tobacco Use  . Smoking status: Never Smoker  . Smokeless tobacco: Never Used  Substance Use Topics  . Alcohol use: No    Comment: denied alcohol   Family History  Problem Relation Age of Onset  . Diabetes Mother   . Hypertension Father   . Heart disease Maternal Aunt        enlarged heart  . Colon cancer Neg Hx   . Esophageal cancer Neg Hx   . Stomach cancer Neg Hx   . Pancreatic cancer Neg Hx   . Liver disease Neg Hx   . Inflammatory bowel disease Neg Hx      Review of Systems  Constitutional: Negative for chills, fatigue and fever.  Respiratory: Negative for cough, chest tightness, shortness of breath and wheezing.   Cardiovascular: Negative for  chest pain, palpitations and leg swelling.    Objective:  There were no vitals taken for this visit.      BP Readings from Last 3 Encounters:  05/06/19 108/78  04/22/19 121/71  04/16/19 104/80   Wt Readings from Last 3 Encounters:  05/06/19 139 lb (63 kg)  04/22/19 137 lb (62.1 kg)  04/16/19 137 lb 8 oz (62.4 kg)    EXAM:  GENERAL: alert, oriented, appears well and in no acute distress  HEENT: atraumatic, conjunctiva clear, no obvious abnormalities on inspection of external nose and ears  NECK: normal movements of the head and neck  LUNGS: on inspection no signs of respiratory distress, breathing rate appears normal, no obvious gross SOB, gasping or wheezing  CV: no obvious cyanosis  MS: moves all visible extremities without noticeable abnormality  PSYCH/NEURO: pleasant and cooperative, no obvious depression or anxiety,  speech and thought processing grossly intact  SKIN: no facial or neck abnormalities.   Assessment/Plan  1. Ulcerative colitis with complication, unspecified location [facility]) Currently symptoms are stable.  She continues to follow with GI regularly.  She is feeling good on current regimen.  2. Acute deep vein thrombosis (DVT) of iliac vein of both lower extremities (HCC) I think it is reasonable for her to have a hypercoagulable evaluation prior to restarting her birth control since she had such an extensive clot.  We will consider doing this after she has completed anticoagulation treatment.  She will be following up with vascular for this.   Return for physical in 3-6 months.   I discussed the assessment and treatment plan with the patient. The patient was provided an opportunity to ask questions and all were answered. The patient agreed with the plan and demonstrated an understanding of the instructions.   The patient was advised to call back or seek an in-person evaluation if the symptoms worsen or if the condition fails to improve as  anticipated.  I provided 25 minutes of non-face-to-face time during this encounter.   Micheline Rough, MD

## 2019-05-27 DIAGNOSIS — K5190 Ulcerative colitis, unspecified, without complications: Secondary | ICD-10-CM | POA: Diagnosis not present

## 2019-05-28 ENCOUNTER — Telehealth: Payer: Self-pay | Admitting: *Deleted

## 2019-05-28 NOTE — Telephone Encounter (Signed)
Patient informed of the message below.  CPE scheduled for 08/28/2019.

## 2019-05-28 NOTE — Telephone Encounter (Signed)
-----   Message from Caren Macadam, MD sent at 05/23/2019 10:13 AM EST ----- I told her I was going to do some research on her anticoagulation, etc: let her know that I agree with timing of anticoagulation close to 6 months as she was thinking she remembered, but this can be confirmed by vascular specialist. It would be  best to wait until she is off of these meds to do work up to see if there is is anything else contributing to increased risk of clot.  It would be best to perform blood work about 2 weeks after being off of her anticoagulation.  So this will be a test we will have to do in the future.  After she follows up with vascular, we can order this (or they could).  I do think it would be a good idea to have this work-up prior to starting back on birth control since she did have such an extensive clot.  Please help her schedule a physical in 3 to 6 months time.  If she kept this close to 3 months time, we could order the blood work for clotting evaluation at that time.

## 2019-05-29 ENCOUNTER — Ambulatory Visit: Payer: BC Managed Care – PPO | Admitting: Internal Medicine

## 2019-05-29 ENCOUNTER — Other Ambulatory Visit: Payer: Self-pay | Admitting: Family Medicine

## 2019-06-03 ENCOUNTER — Ambulatory Visit: Payer: BC Managed Care – PPO | Admitting: Internal Medicine

## 2019-06-13 DIAGNOSIS — Z6825 Body mass index (BMI) 25.0-25.9, adult: Secondary | ICD-10-CM | POA: Diagnosis not present

## 2019-06-13 DIAGNOSIS — Z114 Encounter for screening for human immunodeficiency virus [HIV]: Secondary | ICD-10-CM | POA: Diagnosis not present

## 2019-06-13 DIAGNOSIS — Z1151 Encounter for screening for human papillomavirus (HPV): Secondary | ICD-10-CM | POA: Diagnosis not present

## 2019-06-13 DIAGNOSIS — Z118 Encounter for screening for other infectious and parasitic diseases: Secondary | ICD-10-CM | POA: Diagnosis not present

## 2019-06-13 DIAGNOSIS — Z01419 Encounter for gynecological examination (general) (routine) without abnormal findings: Secondary | ICD-10-CM | POA: Diagnosis not present

## 2019-06-13 DIAGNOSIS — Z113 Encounter for screening for infections with a predominantly sexual mode of transmission: Secondary | ICD-10-CM | POA: Diagnosis not present

## 2019-06-13 DIAGNOSIS — Z1159 Encounter for screening for other viral diseases: Secondary | ICD-10-CM | POA: Diagnosis not present

## 2019-06-24 DIAGNOSIS — K5190 Ulcerative colitis, unspecified, without complications: Secondary | ICD-10-CM | POA: Diagnosis not present

## 2019-06-25 ENCOUNTER — Other Ambulatory Visit: Payer: Self-pay | Admitting: Internal Medicine

## 2019-06-27 ENCOUNTER — Other Ambulatory Visit (INDEPENDENT_AMBULATORY_CARE_PROVIDER_SITE_OTHER): Payer: BC Managed Care – PPO

## 2019-06-27 ENCOUNTER — Encounter: Payer: Self-pay | Admitting: Internal Medicine

## 2019-06-27 ENCOUNTER — Ambulatory Visit: Payer: BC Managed Care – PPO | Admitting: Internal Medicine

## 2019-06-27 VITALS — BP 106/80 | HR 88 | Temp 98.3°F | Ht 63.5 in | Wt 148.5 lb

## 2019-06-27 DIAGNOSIS — K51919 Ulcerative colitis, unspecified with unspecified complications: Secondary | ICD-10-CM

## 2019-06-27 LAB — CBC WITH DIFFERENTIAL/PLATELET
Basophils Absolute: 0.1 10*3/uL (ref 0.0–0.1)
Basophils Relative: 0.4 % (ref 0.0–3.0)
Eosinophils Absolute: 0.1 10*3/uL (ref 0.0–0.7)
Eosinophils Relative: 0.5 % (ref 0.0–5.0)
HCT: 41.1 % (ref 36.0–46.0)
Hemoglobin: 13.4 g/dL (ref 12.0–15.0)
Lymphocytes Relative: 9.9 % — ABNORMAL LOW (ref 12.0–46.0)
Lymphs Abs: 1.4 10*3/uL (ref 0.7–4.0)
MCHC: 32.6 g/dL (ref 30.0–36.0)
MCV: 80.1 fl (ref 78.0–100.0)
Monocytes Absolute: 0.5 10*3/uL (ref 0.1–1.0)
Monocytes Relative: 3.2 % (ref 3.0–12.0)
Neutro Abs: 12.3 10*3/uL — ABNORMAL HIGH (ref 1.4–7.7)
Neutrophils Relative %: 86 % — ABNORMAL HIGH (ref 43.0–77.0)
Platelets: 339 10*3/uL (ref 150.0–400.0)
RBC: 5.13 Mil/uL — ABNORMAL HIGH (ref 3.87–5.11)
RDW: 16.8 % — ABNORMAL HIGH (ref 11.5–15.5)
WBC: 14.3 10*3/uL — ABNORMAL HIGH (ref 4.0–10.5)

## 2019-06-27 LAB — COMPREHENSIVE METABOLIC PANEL
ALT: 9 U/L (ref 0–35)
AST: 10 U/L (ref 0–37)
Albumin: 4.2 g/dL (ref 3.5–5.2)
Alkaline Phosphatase: 43 U/L (ref 39–117)
BUN: 17 mg/dL (ref 6–23)
CO2: 29 mEq/L (ref 19–32)
Calcium: 9.4 mg/dL (ref 8.4–10.5)
Chloride: 103 mEq/L (ref 96–112)
Creatinine, Ser: 0.86 mg/dL (ref 0.40–1.20)
GFR: 76.99 mL/min (ref >60.00)
Glucose, Bld: 115 mg/dL — ABNORMAL HIGH (ref 70–99)
Potassium: 3.9 mEq/L (ref 3.5–5.1)
Sodium: 138 mEq/L (ref 135–145)
Total Bilirubin: 0.4 mg/dL (ref 0.2–1.2)
Total Protein: 7.1 g/dL (ref 6.0–8.3)

## 2019-06-27 LAB — HIGH SENSITIVITY CRP: CRP, High Sensitivity: 1.53 mg/L (ref 0.000–5.000)

## 2019-06-27 NOTE — Patient Instructions (Signed)
Your provider has requested that you go to the basement level for lab work before leaving today. Press "B" on the elevator. The lab is located at the first door on the left as you exit the elevator.  Please follow up in 3 months

## 2019-06-27 NOTE — Progress Notes (Signed)
HISTORY OF PRESENT ILLNESS:  Monica Ortega is a 28 y.o. female with severe refractory universal ulcerative colitis who presents today for follow-up.  Last office visit May 06, 2019 after having undergone colonoscopy to evaluate worsening colitis symptoms after tapering off steroids despite adequate Remicade drug levels with negative antibodies.   See that dictation.  The summary of her history is as follows:  1. Abrupt onset of acute colitis symptoms March 2020. CT scan at that time revealed pancolitis. Colonoscopy to the descending colon revealed severe ulcerative colitis with biopsy changes consistent with the same. No evidence for infection. Placed on mesalamine and steroids. 2. Persistent symptoms and fever requiring hospitalization April 2020. CT scan again revealed pancolitis. Repeat limited colonoscopy revealed acute colitis. Biopsies consistent with chronic UC. No evidence for infection. Begin Remicade therapy Found to have acuteDVTin the left common and internal iliac veins as well as the right peroneal vein and left posterior tibial vein. She was anticoagulated. For bleeding had a IVC filter placed. 3. Persistent symptoms despite medical therapies requiring hospitalization most recently June 18 through January 03, 2019. Prednisone was increased to high-dose prednisone. Imuran started. Remicade provided at 10 mg/kg. CT scan again with pancolitis. No evidence for infection. Developed significant and persistently elevated blood sugars for which she is now on Jardiance. Discharge blood work included white blood cell count 6.4, hemoglobin 8.6, MCV 79, glucose 241. 4.  Complete colonoscopy April 22, 2019 revealing moderately severe pan ulcerative colitis with most inflammation in the left colon.  The terminal ileum was normal.  Prescribed prednisone at that time.  Remicade discontinued.  Transition to [facility] as Remicade drug levels were quite adequate with negative  antibodies.  She reports to me that she has just completed her third Entyvio infusion (induction series).  Next infusion planned for August 19, 2019.  She has slowly tapered off prednisone.  Currently on 20 mg daily.  She continues on Imuran 50 mg daily.  Since her last visit she has done well.  She reports to me 3 normal bowel movements per day which are formed and without blood.  She does notice some mucus at times.  She also reports fleeting abdominal discomfort which is described as mild and lasting less than 5 minutes.  May occur 2 or 3 times per day.  Not associated with meals, activity, or bowel movements.  Her appetite has been good.  She has gained 9 pounds since her last visit.  No additional blood work since October.  No new complaints.  She is scheduled to see Dr. Drue Flirt to discuss the role of surgery in ulcerative colitis, should this be needed.  That appointment is September 03, 2019  REVIEW OF SYSTEMS:  All non-GI ROS negative unless otherwise stated in the HPI except for depression, anxiety  Past Medical History:  Diagnosis Date  . Ankle sprain   . Anxiety   . Colitis   . Depression    denies hospitalization or medication  . DVT (deep venous thrombosis) ([place]) 10/31/2018  . DVT (deep venous thrombosis) ([place])   . Hyponatremia   . Universal ulcerative colitis with rectal bleeding ([place]) 10/31/2018    Past Surgical History:  Procedure Laterality Date  . BIOPSY  10/01/2018   Procedure: BIOPSY;  Surgeon: Doran Stabler, MD;  Location: [facility] ENDOSCOPY;  Service: Endoscopy;;  . BIOPSY  11/01/2018   Procedure: BIOPSY;  Surgeon: Gatha Mayer, MD;  Location: [place];  Service: Endoscopy;;  . COLONOSCOPY WITH PROPOFOL N/A  10/01/2018   Procedure: COLONOSCOPY WITH PROPOFOL;  Surgeon: Doran Stabler, MD;  Location: [place];  Service: Endoscopy;  Laterality: N/A;  . COLONOSCOPY WITH PROPOFOL N/A 11/01/2018   Procedure: COLONOSCOPY WITH PROPOFOL;  Surgeon: Gatha Mayer,  MD;  Location[facility] ENDOSCOPY;  Service: Endoscopy;  Laterality: N/A;  . IVC FILTER REMOVAL N/A 02/06/2019   Procedure: IVC FILTER REMOVAL;  Surgeon: Waynetta Sandy, MD;  Location: [place] CV LAB;  Service: Cardiovascular;  Laterality: N/A;  . VENA CAVA FILTER PLACEMENT N/A 10/31/2018   Procedure: INSERTION VENA-CAVA FILTER;  Surgeon: Rosetta Posner, MD;  Location: [place];  Service: Vascular;  Laterality: N/A;  . WISDOM TOOTH EXTRACTION      Social History Monica Ortega  reports that she has never smoked. She has never used smokeless tobacco. She reports that she does not drink alcohol or use drugs.  family history includes Breast cancer in her paternal aunt; Dementia in her maternal grandmother; Diabetes in her mother; Healthy in her sister; Heart disease in her maternal aunt and maternal grandfather; Hypertension in her father.  No Known Allergies     PHYSICAL EXAMINATION: Vital signs: BP 106/80 (BP Location: Right Arm, Patient Position: Sitting, Cuff Size: Normal)   Pulse 88   Temp 98.3 F (36.8 C)   Ht 5' 3.5" (1.613 m)   Wt 148 lb 8 oz (67.4 kg)   SpO2 100%   BMI 25.89 kg/m   Constitutional: generally well-appearing, no acute distress Psychiatric: alert and oriented x3, cooperative Eyes: extraocular movements intact, anicteric, conjunctiva pink Mouth: Mask Neck: supple no lymphadenopathy Cardiovascular: heart regular rate and rhythm, no murmur Lungs: clear to auscultation bilaterally Abdomen: soft, nontender, nondistended, no obvious ascites, no peritoneal signs, normal bowel sounds, no organomegaly Rectal: Omitted Extremities: no clubbing, cyanosis, trace lower extremity edema bilaterally Skin: no lesions on visible extremities Neuro: No focal deficits.  Cranial nerves intact  ASSESSMENT:  1.  Severe refractory universal ulcerative colitis as outlined.  Inadequate response to Remicade.  Has just completed Entyvio induction series and is tapering  prednisone.  Also on Imuran.  Doing well clinically.   PLAN:  1.  Continue prednisone 20 mg daily for 2 weeks then 10 mg daily for 2 weeks, then stop. 2.  Continue Entyvio and Imuran 3.  Blood work today including CBC, comprehensive metabolic panel, C-reactive protein 4.  Keep surgical appointment with Dr. Drue Flirt 5.  GI follow-up 3 months.  Contact the office in the interim for any questions or problems.

## 2019-07-01 ENCOUNTER — Other Ambulatory Visit: Payer: Self-pay

## 2019-07-01 DIAGNOSIS — K51919 Ulcerative colitis, unspecified with unspecified complications: Secondary | ICD-10-CM

## 2019-07-01 DIAGNOSIS — K5190 Ulcerative colitis, unspecified, without complications: Secondary | ICD-10-CM

## 2019-07-01 MED ORDER — PREDNISONE 10 MG PO TABS: ORAL_TABLET | ORAL | 0 refills | Status: DC

## 2019-07-10 MED ORDER — ONDANSETRON HCL 4 MG PO TABS: 4.0000 mg | ORAL_TABLET | ORAL | 0 refills | Status: AC

## 2019-07-16 ENCOUNTER — Other Ambulatory Visit: Payer: Self-pay

## 2019-07-16 MED ORDER — TRAMADOL HCL 50 MG PO TABS: 50.0000 mg | ORAL_TABLET | Freq: Four times a day (QID) | ORAL | 0 refills | Status: DC | PRN

## 2019-07-22 ENCOUNTER — Encounter: Payer: Self-pay | Admitting: Family Medicine

## 2019-07-23 ENCOUNTER — Encounter: Payer: Self-pay | Admitting: Family Medicine

## 2019-07-25 ENCOUNTER — Ambulatory Visit: Payer: BC Managed Care – PPO | Attending: Internal Medicine

## 2019-07-25 DIAGNOSIS — Z20822 Contact with and (suspected) exposure to covid-19: Secondary | ICD-10-CM | POA: Diagnosis not present

## 2019-07-27 LAB — NOVEL CORONAVIRUS, NAA: SARS-CoV-2, NAA: NOT DETECTED

## 2019-07-29 ENCOUNTER — Telehealth: Payer: Self-pay

## 2019-07-29 ENCOUNTER — Other Ambulatory Visit: Payer: BC Managed Care – PPO

## 2019-07-29 ENCOUNTER — Other Ambulatory Visit: Payer: Self-pay

## 2019-07-29 DIAGNOSIS — K51919 Ulcerative colitis, unspecified with unspecified complications: Secondary | ICD-10-CM

## 2019-07-29 DIAGNOSIS — R197 Diarrhea, unspecified: Secondary | ICD-10-CM

## 2019-07-29 NOTE — Telephone Encounter (Signed)
Pt aware.

## 2019-07-29 NOTE — Telephone Encounter (Signed)
Pt scheduled for flex-sig 08/02/19@4pm . Pt is currently taking eliquis. Please advise if you want pt to hold this and I will contact cardiology.

## 2019-07-29 NOTE — Telephone Encounter (Signed)
Hold day prior and day of the procedure.  Thanks

## 2019-07-31 ENCOUNTER — Other Ambulatory Visit: Payer: Self-pay | Admitting: Internal Medicine

## 2019-07-31 ENCOUNTER — Ambulatory Visit (INDEPENDENT_AMBULATORY_CARE_PROVIDER_SITE_OTHER): Payer: BC Managed Care – PPO

## 2019-07-31 ENCOUNTER — Other Ambulatory Visit: Payer: BC Managed Care – PPO

## 2019-07-31 DIAGNOSIS — R197 Diarrhea, unspecified: Secondary | ICD-10-CM

## 2019-07-31 DIAGNOSIS — Z1159 Encounter for screening for other viral diseases: Secondary | ICD-10-CM | POA: Diagnosis not present

## 2019-07-31 LAB — SARS CORONAVIRUS 2 (TAT 6-24 HRS): SARS Coronavirus 2: NEGATIVE

## 2019-08-01 ENCOUNTER — Other Ambulatory Visit: Payer: Self-pay

## 2019-08-01 DIAGNOSIS — R197 Diarrhea, unspecified: Secondary | ICD-10-CM

## 2019-08-01 LAB — CLOSTRIDIUM DIFFICILE BY PCR

## 2019-08-02 ENCOUNTER — Ambulatory Visit (AMBULATORY_SURGERY_CENTER): Payer: BC Managed Care – PPO | Admitting: Internal Medicine

## 2019-08-02 ENCOUNTER — Other Ambulatory Visit: Payer: Self-pay

## 2019-08-02 ENCOUNTER — Encounter: Payer: Self-pay | Admitting: Internal Medicine

## 2019-08-02 VITALS — BP 121/81 | HR 81 | Temp 95.9°F | Resp 17 | Ht 63.5 in | Wt 148.0 lb

## 2019-08-02 DIAGNOSIS — R197 Diarrhea, unspecified: Secondary | ICD-10-CM | POA: Diagnosis not present

## 2019-08-02 DIAGNOSIS — K625 Hemorrhage of anus and rectum: Secondary | ICD-10-CM

## 2019-08-02 DIAGNOSIS — K51919 Ulcerative colitis, unspecified with unspecified complications: Secondary | ICD-10-CM | POA: Diagnosis not present

## 2019-08-02 DIAGNOSIS — K5190 Ulcerative colitis, unspecified, without complications: Secondary | ICD-10-CM | POA: Diagnosis not present

## 2019-08-02 MED ORDER — SODIUM CHLORIDE 0.9 % IV SOLN: 500.0000 mL | Freq: Once | INTRAVENOUS | Status: DC

## 2019-08-02 MED ORDER — TRAMADOL HCL 50 MG PO TABS: 50.0000 mg | ORAL_TABLET | Freq: Four times a day (QID) | ORAL | 0 refills | Status: DC | PRN

## 2019-08-02 NOTE — Progress Notes (Signed)
PT taken to PACU. Monitors in place. VSS. Report given to RN. 

## 2019-08-02 NOTE — Op Note (Signed)
[facility] Patient Name: Monica Ortega Procedure Date: 08/02/2019 4:30 PM MRN: [id number] Endoscopist: Docia Chuck. Henrene Pastor , MD Age: 29 Referring MD:  Date of Birth: 21-Sep-1990 Gender: Female Account #: [id number] Procedure:                Flexible Sigmoidoscopy with biopsies Indications:              Disease activity assessment of chronic ulcerative                            pancolitis. Due to significant active disease on                            previous exam the patient was transitioned away                            from Remicade to [place]. Had been maintained on                            azathioprine. For worsening disease recently placed                            on high-dose prednisone. Continues with loose                            bowels, bleeding, and abdominal pain. No fevers.                            Recent C. difficile testing was negative. Now for                            flexible sigmoidoscopy with biopsies to assess                            disease and rule out CMV. Medicines:                Monitored Anesthesia Care Procedure:                Pre-Anesthesia Assessment:                           - Prior to the procedure, a History and Physical                            was performed, and patient medications and                            allergies were reviewed. The patient's tolerance of                            previous anesthesia was also reviewed. The risks                            and benefits of the procedure and the sedation  options and risks were discussed with the patient.                            All questions were answered, and informed consent                            was obtained. Prior Anticoagulants: The patient has                            taken Eliquis (apixaban), last dose was 2 days                            prior to procedure. ASA Grade Assessment: III - A                            patient  with severe systemic disease. After                            reviewing the risks and benefits, the patient was                            deemed in satisfactory condition to undergo the                            procedure.                           After obtaining informed consent, the scope was                            passed under direct vision. The Colonoscope was                            introduced through the anus and advanced to the the                            descending colon. The flexible sigmoidoscopy was                            accomplished without difficulty. The patient                            tolerated the procedure well. The quality of the                            bowel preparation was good. Scope In: Scope Out: Findings:                 The perianal and digital rectal examinations were                            normal.                           Inflammation characterized by congestion (edema),  friability, loss of vascularity, mucus,                            pseudopolyps, scarring, deep ulcerations and                            serpentine ulcerations was found in a continuous                            and circumferential pattern from the anus to the                            descending colon (extent of exam). No sites were                            spared. This was severe, and when compared to                            previous examinations, the findings are worsened.                            Biopsies were taken with a cold forceps for                            histology. Complications:            No immediate complications. Estimated Blood Loss:     Estimated blood loss: none. Impression:               - Pancolitis ulcerative colitis. Inflammation was                            found from the anus to the descending colon (extent                            of exam). This was severe, worsened compared to                             previous examinations. Biopsied. Recommendation:           - Resume Eliquis (apixaban) at prior dose today.                           - Continue other medications without change                           - Follow-up pathology (sent [facility])                           - Obtain laboratories to include CBC, C-reactive                            protein, comprehensive metabolic panel. Stop by the                            laboratory on Monday  morning Docia Chuck. Henrene Pastor, MD 08/02/2019 5:01:34 PM This report has been signed electronically.

## 2019-08-02 NOTE — Progress Notes (Signed)
VS-Donna Marcello [place]ores Temperature-Lisa Clapps

## 2019-08-02 NOTE — Patient Instructions (Signed)
Handout on ulcerative colitis given to you today. Resume eliquis today. Await pathology results Obtain Labs at the laboratory on Monday morning    YOU HAD AN ENDOSCOPIC PROCEDURE TODAY AT [place]:   Refer to the procedure report that was given to you for any specific questions about what was found during the examination.  If the procedure report does not answer your questions, please call your gastroenterologist to clarify.  If you requested that your care partner not be given the details of your procedure findings, then the procedure report has been included in a sealed envelope for you to review at your convenience later.  YOU SHOULD EXPECT: Some feelings of bloating in the abdomen. Passage of more gas than usual.  Walking can help get rid of the air that was put into your GI tract during the procedure and reduce the bloating. If you had a lower endoscopy (such as a colonoscopy or flexible sigmoidoscopy) you may notice spotting of blood in your stool or on the toilet paper. If you underwent a bowel prep for your procedure, you may not have a normal bowel movement for a few days.  Please Note:  You might notice some irritation and congestion in your nose or some drainage.  This is from the oxygen used during your procedure.  There is no need for concern and it should clear up in a day or so.  SYMPTOMS TO REPORT IMMEDIATELY:   Following lower endoscopy (colonoscopy or flexible sigmoidoscopy):  Excessive amounts of blood in the stool  Significant tenderness or worsening of abdominal pains  Swelling of the abdomen that is new, acute  Fever of 100F or higher  For urgent or emergent issues, a gastroenterologist can be reached at any hour by calling [phone].   DIET:  We do recommend a small meal at first, but then you may proceed to your regular diet.  Drink plenty of fluids but you should avoid alcoholic beverages for 24 hours.  ACTIVITY:  You should plan to take it  easy for the rest of today and you should NOT DRIVE or use heavy machinery until tomorrow (because of the sedation medicines used during the test).    FOLLOW UP: Our staff will call the number listed on your records 48-72 hours following your procedure to check on you and address any questions or concerns that you may have regarding the information given to you following your procedure. If we do not reach you, we will leave a message.  We will attempt to reach you two times.  During this call, we will ask if you have developed any symptoms of COVID 19. If you develop any symptoms (ie: fever, flu-like symptoms, shortness of breath, cough etc.) before then, please call [phone].  If you test positive for Covid 19 in the 2 weeks post procedure, please call and report this information to [place].    If any biopsies were taken you will be contacted by phone or by letter within the next 1-3 weeks.  Please call us at [phone] if you have not heard about the biopsies in 3 weeks.    SIGNATURES/CONFIDENTIALITY: You and/or your care partner have signed paperwork which will be entered into your electronic medical record.  These signatures attest to the fact that that the information above on your After Visit Summary has been reviewed and is understood.  Full responsibility of the confidentiality of this discharge information lies with you and/or your care-partner.

## 2019-08-06 ENCOUNTER — Telehealth: Payer: Self-pay

## 2019-08-06 ENCOUNTER — Other Ambulatory Visit (INDEPENDENT_AMBULATORY_CARE_PROVIDER_SITE_OTHER): Payer: BC Managed Care – PPO

## 2019-08-06 ENCOUNTER — Other Ambulatory Visit: Payer: BC Managed Care – PPO

## 2019-08-06 DIAGNOSIS — K625 Hemorrhage of anus and rectum: Secondary | ICD-10-CM

## 2019-08-06 DIAGNOSIS — K51919 Ulcerative colitis, unspecified with unspecified complications: Secondary | ICD-10-CM | POA: Diagnosis not present

## 2019-08-06 DIAGNOSIS — D729 Disorder of white blood cells, unspecified: Secondary | ICD-10-CM

## 2019-08-06 DIAGNOSIS — R197 Diarrhea, unspecified: Secondary | ICD-10-CM

## 2019-08-06 LAB — COMPREHENSIVE METABOLIC PANEL
ALT: 13 U/L (ref 0–35)
AST: 9 U/L (ref 0–37)
Albumin: 3.5 g/dL (ref 3.5–5.2)
Alkaline Phosphatase: 50 U/L (ref 39–117)
BUN: 12 mg/dL (ref 6–23)
CO2: 30 mEq/L (ref 19–32)
Calcium: 8.8 mg/dL (ref 8.4–10.5)
Chloride: 100 mEq/L (ref 96–112)
Creatinine, Ser: 0.91 mg/dL (ref 0.40–1.20)
GFR: 72.07 mL/min (ref >60.00)
Glucose, Bld: 182 mg/dL — ABNORMAL HIGH (ref 70–99)
Potassium: 3.9 mEq/L (ref 3.5–5.1)
Sodium: 136 mEq/L (ref 135–145)
Total Bilirubin: 0.3 mg/dL (ref 0.2–1.2)
Total Protein: 6.4 g/dL (ref 6.0–8.3)

## 2019-08-06 LAB — CBC WITH DIFFERENTIAL/PLATELET
Basophils Absolute: 0 10*3/uL (ref 0.0–0.1)
Basophils Relative: 0.1 % (ref 0.0–3.0)
Eosinophils Absolute: 0 10*3/uL (ref 0.0–0.7)
Eosinophils Relative: 0.3 % (ref 0.0–5.0)
HCT: 35 % — ABNORMAL LOW (ref 36.0–46.0)
Hemoglobin: 11.4 g/dL — ABNORMAL LOW (ref 12.0–15.0)
Lymphocytes Relative: 7.2 % — ABNORMAL LOW (ref 12.0–46.0)
Lymphs Abs: 0.6 10*3/uL — ABNORMAL LOW (ref 0.7–4.0)
MCHC: 32.7 g/dL (ref 30.0–36.0)
MCV: 76.1 fl — ABNORMAL LOW (ref 78.0–100.0)
Monocytes Absolute: 0.2 10*3/uL (ref 0.1–1.0)
Monocytes Relative: 2.1 % — ABNORMAL LOW (ref 3.0–12.0)
Neutro Abs: 7.3 10*3/uL (ref 1.4–7.7)
Neutrophils Relative %: 90.3 % — ABNORMAL HIGH (ref 43.0–77.0)
Platelets: 385 10*3/uL (ref 150.0–400.0)
RBC: 4.6 Mil/uL (ref 3.87–5.11)
RDW: 14.7 % (ref 11.5–15.5)
WBC: 8.1 10*3/uL (ref 4.0–10.5)

## 2019-08-06 LAB — HIGH SENSITIVITY CRP: CRP, High Sensitivity: 37.7 mg/L — ABNORMAL HIGH (ref 0.000–5.000)

## 2019-08-06 NOTE — Telephone Encounter (Signed)
  Follow up Call-  Call back number 08/02/2019 04/22/2019  Post procedure Call Back phone  # [phone]  Permission to leave phone message Yes Yes  Some recent data might be hidden     Patient questions:  Do you have a fever, pain , or abdominal swelling? No. Pain Score  0 *  Have you tolerated food without any problems? Yes.    Have you been able to return to your normal activities? Yes.    Do you have any questions about your discharge instructions: Diet   No. Medications  No. Follow up visit  No.  Do you have questions or concerns about your Care? No.  Actions: * If pain score is 4 or above: No action needed, pain <4.   1. Have you developed a fever since your procedure? No  2.   Have you had an respiratory symptoms (SOB or cough) since your procedure? No  3.   Have you tested positive for COVID 19 since your procedure No  4.   Have you had any family members/close contacts diagnosed with the COVID 19 since your procedure?  No   If yes to any of these questions please route to Joylene John, RN and Alphonsa Gin, RN.

## 2019-08-06 NOTE — Telephone Encounter (Signed)
Spoke with pt and she is aware and will come today.

## 2019-08-06 NOTE — Telephone Encounter (Signed)
-----   Message from Irene Shipper, MD sent at 08/05/2019  8:21 PM EST ----- Regarding: labs She was to come in today (Monday) for labs per my Flex report. CBC, CMET, CRP. Make sure these get done ASAP, if not already. Thanks

## 2019-08-07 ENCOUNTER — Encounter: Payer: Self-pay | Admitting: Internal Medicine

## 2019-08-07 ENCOUNTER — Other Ambulatory Visit: Payer: Self-pay

## 2019-08-07 DIAGNOSIS — K51919 Ulcerative colitis, unspecified with unspecified complications: Secondary | ICD-10-CM

## 2019-08-07 LAB — CLOSTRIDIUM DIFFICILE BY PCR: Toxigenic C. Difficile by PCR: NEGATIVE

## 2019-08-07 LAB — PATHOLOGIST SMEAR REVIEW

## 2019-08-07 MED ORDER — AZATHIOPRINE 100 MG PO TABS: 100.0000 mg | ORAL_TABLET | Freq: Every day | ORAL | 3 refills | Status: DC

## 2019-08-09 ENCOUNTER — Telehealth: Payer: Self-pay

## 2019-08-09 NOTE — Telephone Encounter (Signed)
Called Dr. Orville Govern' office to see if pts appt could be moved up sooner than 2/23. They did not have anything sooner, they will add her to the cancellation list.

## 2019-08-09 NOTE — Telephone Encounter (Signed)
Pt sent mychart message notifying her.

## 2019-08-09 NOTE — Telephone Encounter (Signed)
I appreciate it.  Let Deneise Lever know

## 2019-08-17 ENCOUNTER — Other Ambulatory Visit: Payer: Self-pay | Admitting: Internal Medicine

## 2019-08-17 DIAGNOSIS — K5190 Ulcerative colitis, unspecified, without complications: Secondary | ICD-10-CM

## 2019-08-17 DIAGNOSIS — K51919 Ulcerative colitis, unspecified with unspecified complications: Secondary | ICD-10-CM

## 2019-08-19 ENCOUNTER — Encounter: Payer: Self-pay | Admitting: Internal Medicine

## 2019-08-19 DIAGNOSIS — K5190 Ulcerative colitis, unspecified, without complications: Secondary | ICD-10-CM | POA: Diagnosis not present

## 2019-08-19 DIAGNOSIS — K5100 Ulcerative (chronic) pancolitis without complications: Secondary | ICD-10-CM | POA: Diagnosis not present

## 2019-08-20 ENCOUNTER — Other Ambulatory Visit: Payer: Self-pay

## 2019-08-20 MED ORDER — AZATHIOPRINE 100 MG PO TABS: 100.0000 mg | ORAL_TABLET | Freq: Every day | ORAL | 3 refills | Status: DC

## 2019-08-23 LAB — CLOSTRIDIUM DIFFICILE EIA: C difficile Toxins A+B, EIA: NEGATIVE

## 2019-08-23 LAB — SPECIMEN STATUS REPORT

## 2019-08-26 ENCOUNTER — Other Ambulatory Visit: Payer: Self-pay

## 2019-08-26 DIAGNOSIS — K51919 Ulcerative colitis, unspecified with unspecified complications: Secondary | ICD-10-CM

## 2019-08-26 MED ORDER — AZATHIOPRINE 50 MG PO TABS: 100.0000 mg | ORAL_TABLET | Freq: Every day | ORAL | 11 refills | Status: DC

## 2019-08-27 ENCOUNTER — Other Ambulatory Visit: Payer: Self-pay

## 2019-08-28 ENCOUNTER — Encounter: Payer: Self-pay | Admitting: Family Medicine

## 2019-08-28 ENCOUNTER — Ambulatory Visit (INDEPENDENT_AMBULATORY_CARE_PROVIDER_SITE_OTHER): Payer: BC Managed Care – PPO | Admitting: Family Medicine

## 2019-08-28 ENCOUNTER — Other Ambulatory Visit: Payer: Self-pay

## 2019-08-28 VITALS — BP 108/76 | HR 76 | Temp 97.1°F | Ht 63.25 in | Wt 143.4 lb

## 2019-08-28 DIAGNOSIS — Z0000 Encounter for general adult medical examination without abnormal findings: Secondary | ICD-10-CM

## 2019-08-28 DIAGNOSIS — R5383 Other fatigue: Secondary | ICD-10-CM | POA: Diagnosis not present

## 2019-08-28 DIAGNOSIS — R739 Hyperglycemia, unspecified: Secondary | ICD-10-CM

## 2019-08-28 DIAGNOSIS — I82423 Acute embolism and thrombosis of iliac vein, bilateral: Secondary | ICD-10-CM

## 2019-08-28 DIAGNOSIS — K51011 Ulcerative (chronic) pancolitis with rectal bleeding: Secondary | ICD-10-CM

## 2019-08-28 DIAGNOSIS — Z13220 Encounter for screening for lipoid disorders: Secondary | ICD-10-CM | POA: Diagnosis not present

## 2019-08-28 DIAGNOSIS — D649 Anemia, unspecified: Secondary | ICD-10-CM | POA: Diagnosis not present

## 2019-08-28 LAB — CBC WITH DIFFERENTIAL/PLATELET
Basophils Absolute: 0 10*3/uL (ref 0.0–0.1)
Basophils Relative: 0.1 % (ref 0.0–3.0)
Eosinophils Absolute: 0 10*3/uL (ref 0.0–0.7)
Eosinophils Relative: 0.1 % (ref 0.0–5.0)
HCT: 34.9 % — ABNORMAL LOW (ref 36.0–46.0)
Hemoglobin: 11.2 g/dL — ABNORMAL LOW (ref 12.0–15.0)
Lymphocytes Relative: 6.8 % — ABNORMAL LOW (ref 12.0–46.0)
Lymphs Abs: 0.5 10*3/uL — ABNORMAL LOW (ref 0.7–4.0)
MCHC: 32.1 g/dL (ref 30.0–36.0)
MCV: 76.5 fl — ABNORMAL LOW (ref 78.0–100.0)
Monocytes Absolute: 0.1 10*3/uL (ref 0.1–1.0)
Monocytes Relative: 1.6 % — ABNORMAL LOW (ref 3.0–12.0)
Neutro Abs: 6.5 10*3/uL (ref 1.4–7.7)
Neutrophils Relative %: 91.4 % — ABNORMAL HIGH (ref 43.0–77.0)
Platelets: 343 10*3/uL (ref 150.0–400.0)
RBC: 4.56 Mil/uL (ref 3.87–5.11)
RDW: 16.6 % — ABNORMAL HIGH (ref 11.5–15.5)
WBC: 7.1 10*3/uL (ref 4.0–10.5)

## 2019-08-28 LAB — TSH: TSH: 1.03 u[IU]/mL (ref 0.35–4.50)

## 2019-08-28 LAB — COMPREHENSIVE METABOLIC PANEL
ALT: 9 U/L (ref 0–35)
AST: 7 U/L (ref 0–37)
Albumin: 3.7 g/dL (ref 3.5–5.2)
Alkaline Phosphatase: 44 U/L (ref 39–117)
BUN: 15 mg/dL (ref 6–23)
CO2: 32 mEq/L (ref 19–32)
Calcium: 8.7 mg/dL (ref 8.4–10.5)
Chloride: 102 mEq/L (ref 96–112)
Creatinine, Ser: 0.82 mg/dL (ref 0.40–1.20)
GFR: 81.25 mL/min (ref >60.00)
Glucose, Bld: 175 mg/dL — ABNORMAL HIGH (ref 70–99)
Potassium: 4.6 mEq/L (ref 3.5–5.1)
Sodium: 139 mEq/L (ref 135–145)
Total Bilirubin: 0.4 mg/dL (ref 0.2–1.2)
Total Protein: 6.1 g/dL (ref 6.0–8.3)

## 2019-08-28 LAB — LIPID PANEL
Cholesterol: 177 mg/dL (ref 0–200)
HDL: 65.5 mg/dL (ref >39.00)
LDL Cholesterol: 87 mg/dL (ref 0–99)
NonHDL: 111.94
Total CHOL/HDL Ratio: 3
Triglycerides: 127 mg/dL (ref 0.0–149.0)
VLDL: 25.4 mg/dL (ref 0.0–40.0)

## 2019-08-28 LAB — HIGH SENSITIVITY CRP: CRP, High Sensitivity: 15.25 mg/L — ABNORMAL HIGH (ref 0.000–5.000)

## 2019-08-28 LAB — VITAMIN B12: Vitamin B-12: 386 pg/mL (ref 211–911)

## 2019-08-28 LAB — IBC + FERRITIN
Ferritin: 13.4 ng/mL (ref 10.0–291.0)
Iron: 22 ug/dL — ABNORMAL LOW (ref 42–145)
Saturation Ratios: 7.4 % — ABNORMAL LOW (ref 20.0–50.0)
Transferrin: 212 mg/dL (ref 212.0–360.0)

## 2019-08-28 LAB — VITAMIN D 25 HYDROXY (VIT D DEFICIENCY, FRACTURES): VITD: 20.94 ng/mL — ABNORMAL LOW (ref 30.00–100.00)

## 2019-08-28 LAB — HEMOGLOBIN A1C: Hgb A1c MFr Bld: 7.3 % — ABNORMAL HIGH (ref 4.6–6.5)

## 2019-08-28 NOTE — Progress Notes (Signed)
Addressed in mychart message

## 2019-08-28 NOTE — Patient Instructions (Signed)
Go to eliquis website and look up/print savings card and check with pharmacy for coverage.

## 2019-08-28 NOTE — Progress Notes (Signed)
Monica Ortega DOB: 1990/12/07 Encounter date: 08/28/2019  This is a 29 y.o. female who presents for complete physical   History of present illness/Additional concerns: Ulcerative colitis: follows with Dr. Henrene Pastor. Right now continuing with Entyvio; but not noting improvement. Has appointment at [facility] next week to discuss surgical options. Still with abdominal pain, still with blood in stools. Appetite is not great, but eating enough to "keep going". Hard to finish meals. Using tramadol for pain BID. If on her regular meds she is able to function for the most part. She is in school for surgical assistant so she is able to work on getting surgical hours when feeling ok.   Developed significant DVT after last hospitalization. Feels like she is doing ok from this standpoint. She is following with vascular and states that she will be due for repeat US. $600 for 3 month supply on recent refill.   Last time she checked sugars fasting was in 90's. Trying not to eat higher sugar foods.   Follows with Dr. Pamala Hurry for gyn needs  Also taking multivitamin with iron   Past Medical History:  Diagnosis Date  . Ankle sprain   . Anxiety   . Colitis   . Depression    denies hospitalization or medication  . DVT (deep venous thrombosis) ([place]) 10/31/2018  . DVT (deep venous thrombosis) ([place])   . Hyponatremia   . Universal ulcerative colitis with rectal bleeding ([place]) 10/31/2018   Past Surgical History:  Procedure Laterality Date  . BIOPSY  10/01/2018   Procedure: BIOPSY;  Surgeon: Doran Stabler, MD;  Location: [place];  Service: Endoscopy;;  . BIOPSY  11/01/2018   Procedure: BIOPSY;  Surgeon: Gatha Mayer, MD;  Location: [place];  Service: Endoscopy;;  . COLONOSCOPY WITH PROPOFOL N/A 10/01/2018   Procedure: COLONOSCOPY WITH PROPOFOL;  Surgeon: Doran Stabler, MD;  Location: [place];  Service: Endoscopy;  Laterality: N/A;  . COLONOSCOPY WITH PROPOFOL N/A 11/01/2018    Procedure: COLONOSCOPY WITH PROPOFOL;  Surgeon: Gatha Mayer, MD;  Location: [facility] ENDOSCOPY;  Service: Endoscopy;  Laterality: N/A;  . IVC FILTER REMOVAL N/A 02/06/2019   Procedure: IVC FILTER REMOVAL;  Surgeon: Waynetta Sandy, MD;  Location: [place] CV LAB;  Service: Cardiovascular;  Laterality: N/A;  . VENA CAVA FILTER PLACEMENT N/A 10/31/2018   Procedure: INSERTION VENA-CAVA FILTER;  Surgeon: Rosetta Posner, MD;  Location: [facility] OR;  Service: Vascular;  Laterality: N/A;  . WISDOM TOOTH EXTRACTION     No Known Allergies Current Meds  Medication Sig  . ACCU-CHEK AVIVA PLUS test strip USE AS DIRECTED  . acetaminophen (ACETAMINOPHEN EXTRA STRENGTH) 500 MG tablet Take 1,000 mg by mouth every 6 (six) hours as needed for headache (pain).   Marland Kitchen apixaban (ELIQUIS) 5 MG TABS tablet Take 1 tablet (5 mg total) by mouth 2 (two) times daily.  Marland Kitchen azathioprine (IMURAN) 100 MG tablet Take 1 tablet (100 mg total) by mouth daily.  . blood glucose meter kit and supplies KIT Use as directed  . dicyclomine (BENTYL) 10 MG capsule TAKE 1 CAPSULE (10 MG TOTAL) BY MOUTH 4 (FOUR) TIMES DAILY - BEFORE MEALS AND AT BEDTIME.  . Lancets MISC 1 each by Does not apply route as directed.  . loperamide (IMODIUM) 2 MG capsule Take 2-4 mg by mouth 2 (two) times daily as needed for diarrhea or loose stools.   . ondansetron (ZOFRAN) 8 MG tablet Take 1 tablet (8 mg total) by mouth  every 8 (eight) hours as needed for nausea.  . pantoprazole (PROTONIX) 40 MG tablet Take 1 tablet (40 mg total) by mouth daily.  . predniSONE (DELTASONE) 10 MG tablet Take 6 tablets (60 mg total) by mouth daily. Take 41m daily;  . promethazine (PHENERGAN) 25 MG tablet Take 1 tablet (25 mg total) by mouth every 6 (six) hours as needed for nausea or vomiting.  . Simethicone (GAS-X PO) Take 1 capsule by mouth daily as needed (flatulence).  . traMADol (ULTRAM) 50 MG tablet Take 1 tablet (50 mg total) by mouth every 6 (six) hours as needed for  moderate pain or severe pain.  . vedolizumab (ENTYVIO) 300 MG injection Inject 300 mg into the vein every 8 (eight) weeks.  . [DISCONTINUED] azaTHIOprine (IMURAN) 50 MG tablet Take 1 tablet (50 mg total) by mouth daily.  . [DISCONTINUED] azaTHIOprine (IMURAN) 50 MG tablet Take 2 tablets (100 mg total) by mouth daily.   Current Facility-Administered Medications for the 08/28/19 encounter (Office Visit) with KCaren Macadam MD  Medication  . 0.9 %  sodium chloride infusion   Social History   Tobacco Use  . Smoking status: Never Smoker  . Smokeless tobacco: Never Used  Substance Use Topics  . Alcohol use: No    Comment: denied alcohol   Family History  Problem Relation Age of Onset  . Diabetes Mother   . Hypertension Father   . Heart disease Maternal Aunt        enlarged heart  . Healthy Sister   . Dementia Maternal Grandmother   . Heart disease Maternal Grandfather   . Breast cancer Paternal Aunt   . Colon cancer Neg Hx   . Esophageal cancer Neg Hx   . Stomach cancer Neg Hx   . Pancreatic cancer Neg Hx   . Liver disease Neg Hx   . Inflammatory bowel disease Neg Hx      Review of Systems  Constitutional: Negative for activity change, appetite change, chills, fatigue, fever and unexpected weight change.  HENT: Negative for congestion, ear pain, hearing loss, sinus pressure, sinus pain, sore throat and trouble swallowing.   Eyes: Negative for pain and visual disturbance.  Respiratory: Negative for cough, chest tightness, shortness of breath and wheezing.   Cardiovascular: Negative for chest pain, palpitations and leg swelling.  Gastrointestinal: Positive for abdominal pain, blood in stool and nausea. Negative for constipation, diarrhea and vomiting.  Genitourinary: Negative for difficulty urinating and menstrual problem.  Musculoskeletal: Negative for arthralgias and back pain.  Skin: Negative for rash.       There are no significant skin abnormalities; chronic mole  upper mid back  Neurological: Negative for dizziness, weakness, numbness and headaches.  Hematological: Negative for adenopathy. Does not bruise/bleed easily.  Psychiatric/Behavioral: Negative for sleep disturbance and suicidal ideas. The patient is not nervous/anxious.     CBC:  Lab Results  Component Value Date   WBC 8.1 08/06/2019   HGB 11.4 (L) 08/06/2019   HCT 35.0 (L) 08/06/2019   MCH 23.8 (L) 01/03/2019   MCHC 32.7 08/06/2019   RDW 14.7 08/06/2019   PLT 385.0 08/06/2019   CMP: Lab Results  Component Value Date   NA 136 08/06/2019   K 3.9 08/06/2019   CL 100 08/06/2019   CO2 30 08/06/2019   ANIONGAP 10 01/03/2019   GLUCOSE 182 (H) 08/06/2019   BUN 12 08/06/2019   CREATININE 0.91 08/06/2019   GFRAA >60 01/03/2019   CALCIUM 8.8 08/06/2019   PROT 6.4  08/06/2019   BILITOT 0.3 08/06/2019   ALKPHOS 50 08/06/2019   ALT 13 08/06/2019   AST 9 08/06/2019   LIPID: Lab Results  Component Value Date   CHOL 181 02/19/2018   TRIG 75.0 02/19/2018   HDL 60.20 02/19/2018   LDLCALC 106 (H) 02/19/2018    Objective:  BP 108/76 (BP Location: Right Arm, Patient Position: Sitting, Cuff Size: Normal)   Pulse 76   Temp (!) 97.1 F (36.2 C) (Temporal)   Ht 5' 3.25" (1.607 m)   Wt 143 lb 6.4 oz (65 kg)   LMP 08/03/2019   SpO2 98%   BMI 25.20 kg/m   Weight: 143 lb 6.4 oz (65 kg)   BP Readings from Last 3 Encounters:  08/28/19 108/76  08/02/19 121/81  06/27/19 106/80   Wt Readings from Last 3 Encounters:  08/28/19 143 lb 6.4 oz (65 kg)  08/02/19 148 lb (67.1 kg)  06/27/19 148 lb 8 oz (67.4 kg)    Physical Exam Constitutional:      General: She is not in acute distress.    Appearance: She is well-developed.  HENT:     Head: Normocephalic and atraumatic.     Comments: Cushingoid facies    Right Ear: External ear normal.     Left Ear: External ear normal.     Mouth/Throat:     Pharynx: No oropharyngeal exudate.  Eyes:     Conjunctiva/sclera: Conjunctivae normal.      Pupils: Pupils are equal, round, and reactive to light.  Neck:     Thyroid: No thyromegaly.  Cardiovascular:     Rate and Rhythm: Normal rate and regular rhythm.     Heart sounds: Normal heart sounds. No murmur. No friction rub. No gallop.   Pulmonary:     Effort: Pulmonary effort is normal.     Breath sounds: Normal breath sounds.  Abdominal:     General: Bowel sounds are normal. There is no distension.     Palpations: Abdomen is soft. There is no mass.     Tenderness: There is no abdominal tenderness. There is no guarding.     Hernia: No hernia is present.  Musculoskeletal:        General: No tenderness or deformity. Normal range of motion.     Cervical back: Normal range of motion and neck supple.  Lymphadenopathy:     Cervical: No cervical adenopathy.  Skin:    General: Skin is warm and dry.     Findings: No rash.  Neurological:     Mental Status: She is alert and oriented to person, place, and time.     Deep Tendon Reflexes: Reflexes normal.     Reflex Scores:      Tricep reflexes are 2+ on the right side and 2+ on the left side.      Bicep reflexes are 2+ on the right side and 2+ on the left side.      Brachioradialis reflexes are 2+ on the right side and 2+ on the left side.      Patellar reflexes are 2+ on the right side and 2+ on the left side. Psychiatric:        Speech: Speech normal.        Behavior: Behavior normal.        Thought Content: Thought content normal.     Assessment/Plan: There are no preventive care reminders to display for this patient. Health Maintenance reviewed.  1. Preventative health care Follows regularly with gyn.  She has had a very difficult year with her ulcerative colitis, DVT. She is doing ok from mood standpoint. She has been able to return to clinical hours.  2. Other fatigue - VITAMIN D 25 Hydroxy (Vit-D Deficiency, Fractures); Future - TSH; Future  3. Anemia, unspecified type - CBC with Differential/Platelet; Future -  IBC + Ferritin; Future - Vitamin B12; Future  4. Universal ulcerative colitis with rectal bleeding ([place]) Following with GI regularly. - CRP, High Sensitivity; Future - CBC with Differential/Platelet; Future - Comprehensive metabolic panel; Future  5. Acute deep vein thrombosis (DVT) of iliac vein of both lower extremities (HCC) On eliquis. Insurance may not cover. She has had sx improvement (no swelling, color change); follows with vascular. She will try savings card from website but if not covered will need to discuss alternative.  6. Hyperglycemia Secondary to prednisone. She is watching carbs. - Hemoglobin A1c; Future  7. Lipid screening - Lipid panel; Future  Return for bloodwork next week; follow up prn.  Micheline Rough, MD

## 2019-09-03 DIAGNOSIS — R1084 Generalized abdominal pain: Secondary | ICD-10-CM | POA: Diagnosis not present

## 2019-09-03 DIAGNOSIS — K51811 Other ulcerative colitis with rectal bleeding: Secondary | ICD-10-CM | POA: Diagnosis not present

## 2019-09-04 NOTE — Telephone Encounter (Signed)
Spoke with Dr Early patient had IVC filter removed by Dr Donzetta Matters in July 2020 I will follow up with Dr Donzetta Matters to see when follow up is needed.

## 2019-09-09 ENCOUNTER — Telehealth: Payer: Self-pay | Admitting: Internal Medicine

## 2019-09-09 NOTE — Telephone Encounter (Signed)
Please see MyChart message.  Pt requested a refill for tramadol.  Pt stated that she had used her last tablet this morning.

## 2019-09-11 NOTE — Telephone Encounter (Signed)
Refilled by [name]

## 2019-09-15 ENCOUNTER — Other Ambulatory Visit: Payer: Self-pay | Admitting: Internal Medicine

## 2019-09-15 DIAGNOSIS — K5190 Ulcerative colitis, unspecified, without complications: Secondary | ICD-10-CM

## 2019-09-15 DIAGNOSIS — K51919 Ulcerative colitis, unspecified with unspecified complications: Secondary | ICD-10-CM

## 2019-09-16 DIAGNOSIS — K5190 Ulcerative colitis, unspecified, without complications: Secondary | ICD-10-CM | POA: Diagnosis not present

## 2019-09-18 ENCOUNTER — Encounter: Payer: Self-pay | Admitting: Family Medicine

## 2019-09-18 DIAGNOSIS — I82493 Acute embolism and thrombosis of other specified deep vein of lower extremity, bilateral: Secondary | ICD-10-CM

## 2019-09-23 ENCOUNTER — Other Ambulatory Visit: Payer: Self-pay

## 2019-09-23 DIAGNOSIS — K51919 Ulcerative colitis, unspecified with unspecified complications: Secondary | ICD-10-CM

## 2019-09-23 MED ORDER — AZATHIOPRINE 100 MG PO TABS: 100.0000 mg | ORAL_TABLET | Freq: Every day | ORAL | 3 refills | Status: DC

## 2019-09-23 MED ORDER — TRAMADOL HCL 50 MG PO TABS: 50.0000 mg | ORAL_TABLET | Freq: Four times a day (QID) | ORAL | 3 refills | Status: DC | PRN

## 2019-09-24 ENCOUNTER — Telehealth: Payer: Self-pay | Admitting: Family Medicine

## 2019-09-24 ENCOUNTER — Other Ambulatory Visit: Payer: Self-pay

## 2019-09-24 ENCOUNTER — Ambulatory Visit (HOSPITAL_COMMUNITY)
Admission: RE | Admit: 2019-09-24 | Discharge: 2019-09-24 | Disposition: A | Discharge status: Home / Self Care | Payer: BC Managed Care – PPO | Source: Ambulatory Visit | Attending: Family Medicine | Admitting: Family Medicine

## 2019-09-24 DIAGNOSIS — I82493 Acute embolism and thrombosis of other specified deep vein of lower extremity, bilateral: Secondary | ICD-10-CM | POA: Diagnosis not present

## 2019-09-24 NOTE — Telephone Encounter (Signed)
Noted. Result note sent.

## 2019-09-24 NOTE — Telephone Encounter (Signed)
Helene from Vascular and vein speacilist is calling to give the pulmonary report: --- negative for anything acute ---there is chronic thrombus in the R. Popliteal ---L. Leg nothing acute there ---everything looks patent.     She is going to let the pt go home and PCP can call for any further instructions.   Pt can be reached at [phone]

## 2019-10-07 ENCOUNTER — Other Ambulatory Visit: Payer: Self-pay

## 2019-10-07 DIAGNOSIS — K51919 Ulcerative colitis, unspecified with unspecified complications: Secondary | ICD-10-CM

## 2019-10-07 MED ORDER — TRAMADOL HCL 50 MG PO TABS: 50.0000 mg | ORAL_TABLET | Freq: Four times a day (QID) | ORAL | 3 refills | Status: DC | PRN

## 2019-10-08 ENCOUNTER — Other Ambulatory Visit (INDEPENDENT_AMBULATORY_CARE_PROVIDER_SITE_OTHER): Payer: BC Managed Care – PPO

## 2019-10-08 DIAGNOSIS — K51919 Ulcerative colitis, unspecified with unspecified complications: Secondary | ICD-10-CM | POA: Diagnosis not present

## 2019-10-08 LAB — CBC WITH DIFFERENTIAL/PLATELET
Basophils Absolute: 0 10*3/uL (ref 0.0–0.1)
Basophils Relative: 0.2 % (ref 0.0–3.0)
Eosinophils Absolute: 0 10*3/uL (ref 0.0–0.7)
Eosinophils Relative: 0 % (ref 0.0–5.0)
HCT: 37.8 % (ref 36.0–46.0)
Hemoglobin: 12.2 g/dL (ref 12.0–15.0)
Lymphocytes Relative: 8.1 % — ABNORMAL LOW (ref 12.0–46.0)
Lymphs Abs: 0.7 10*3/uL (ref 0.7–4.0)
MCHC: 32.1 g/dL (ref 30.0–36.0)
MCV: 78.3 fl (ref 78.0–100.0)
Monocytes Absolute: 0.1 10*3/uL (ref 0.1–1.0)
Monocytes Relative: 1.4 % — ABNORMAL LOW (ref 3.0–12.0)
Neutro Abs: 7.5 10*3/uL (ref 1.4–7.7)
Neutrophils Relative %: 90.3 % — ABNORMAL HIGH (ref 43.0–77.0)
Platelets: 332 10*3/uL (ref 150.0–400.0)
RBC: 4.83 Mil/uL (ref 3.87–5.11)
RDW: 18.2 % — ABNORMAL HIGH (ref 11.5–15.5)
WBC: 8.3 10*3/uL (ref 4.0–10.5)

## 2019-10-08 LAB — COMPREHENSIVE METABOLIC PANEL
ALT: 11 U/L (ref 0–35)
AST: 9 U/L (ref 0–37)
Albumin: 3.9 g/dL (ref 3.5–5.2)
Alkaline Phosphatase: 43 U/L (ref 39–117)
BUN: 15 mg/dL (ref 6–23)
CO2: 29 mEq/L (ref 19–32)
Calcium: 9.1 mg/dL (ref 8.4–10.5)
Chloride: 100 mEq/L (ref 96–112)
Creatinine, Ser: 0.95 mg/dL (ref 0.40–1.20)
GFR: 68.51 mL/min (ref >60.00)
Glucose, Bld: 198 mg/dL — ABNORMAL HIGH (ref 70–99)
Potassium: 4.3 mEq/L (ref 3.5–5.1)
Sodium: 133 mEq/L — ABNORMAL LOW (ref 135–145)
Total Bilirubin: 0.5 mg/dL (ref 0.2–1.2)
Total Protein: 6.5 g/dL (ref 6.0–8.3)

## 2019-10-08 LAB — C-REACTIVE PROTEIN: CRP: <1 mg/dL (ref 0.5–20.0)

## 2019-10-14 DIAGNOSIS — K5190 Ulcerative colitis, unspecified, without complications: Secondary | ICD-10-CM | POA: Diagnosis not present

## 2019-10-21 ENCOUNTER — Other Ambulatory Visit: Payer: Self-pay

## 2019-10-21 DIAGNOSIS — K51919 Ulcerative colitis, unspecified with unspecified complications: Secondary | ICD-10-CM

## 2019-10-21 MED ORDER — TRAMADOL HCL 50 MG PO TABS: 50.0000 mg | ORAL_TABLET | Freq: Four times a day (QID) | ORAL | 3 refills | Status: AC | PRN

## 2019-10-22 DIAGNOSIS — K51811 Other ulcerative colitis with rectal bleeding: Secondary | ICD-10-CM | POA: Diagnosis not present

## 2019-10-22 DIAGNOSIS — R1084 Generalized abdominal pain: Secondary | ICD-10-CM | POA: Diagnosis not present

## 2019-10-25 DIAGNOSIS — Z20822 Contact with and (suspected) exposure to covid-19: Secondary | ICD-10-CM | POA: Diagnosis not present

## 2019-10-25 DIAGNOSIS — Z20828 Contact with and (suspected) exposure to other viral communicable diseases: Secondary | ICD-10-CM | POA: Diagnosis not present

## 2019-11-01 DIAGNOSIS — K51811 Other ulcerative colitis with rectal bleeding: Secondary | ICD-10-CM | POA: Diagnosis not present

## 2019-11-01 DIAGNOSIS — K5289 Other specified noninfective gastroenteritis and colitis: Secondary | ICD-10-CM | POA: Diagnosis not present

## 2019-11-01 DIAGNOSIS — K633 Ulcer of intestine: Secondary | ICD-10-CM | POA: Diagnosis not present

## 2019-11-01 DIAGNOSIS — R627 Adult failure to thrive: Secondary | ICD-10-CM | POA: Diagnosis not present

## 2019-11-01 DIAGNOSIS — Z7952 Long term (current) use of systemic steroids: Secondary | ICD-10-CM | POA: Diagnosis not present

## 2019-11-01 DIAGNOSIS — Z86718 Personal history of other venous thrombosis and embolism: Secondary | ICD-10-CM | POA: Diagnosis not present

## 2019-11-01 DIAGNOSIS — Z7901 Long term (current) use of anticoagulants: Secondary | ICD-10-CM | POA: Diagnosis not present

## 2019-11-01 DIAGNOSIS — G8918 Other acute postprocedural pain: Secondary | ICD-10-CM | POA: Diagnosis not present

## 2019-11-01 DIAGNOSIS — K5180 Other ulcerative colitis without complications: Secondary | ICD-10-CM | POA: Diagnosis not present

## 2019-11-20 DIAGNOSIS — Z932 Ileostomy status: Secondary | ICD-10-CM | POA: Diagnosis not present

## 2019-11-24 ENCOUNTER — Other Ambulatory Visit: Payer: Self-pay | Admitting: Family Medicine

## 2020-01-15 DIAGNOSIS — Z932 Ileostomy status: Secondary | ICD-10-CM | POA: Diagnosis not present

## 2020-02-11 DIAGNOSIS — K51011 Ulcerative (chronic) pancolitis with rectal bleeding: Secondary | ICD-10-CM | POA: Diagnosis not present

## 2020-02-19 DIAGNOSIS — Z932 Ileostomy status: Secondary | ICD-10-CM | POA: Diagnosis not present

## 2020-03-03 ENCOUNTER — Encounter: Payer: Self-pay | Admitting: Family Medicine

## 2020-03-03 ENCOUNTER — Ambulatory Visit (INDEPENDENT_AMBULATORY_CARE_PROVIDER_SITE_OTHER): Payer: BC Managed Care – PPO | Admitting: Family Medicine

## 2020-03-03 ENCOUNTER — Other Ambulatory Visit: Payer: Self-pay

## 2020-03-03 VITALS — BP 130/60 | HR 101 | Temp 98.7°F | Wt 168.2 lb

## 2020-03-03 DIAGNOSIS — M25471 Effusion, right ankle: Secondary | ICD-10-CM

## 2020-03-03 NOTE — Progress Notes (Signed)
   Subjective:    Patient ID: Monica Ortega, female    DOB: 1990/11/02, 29 y.o.   MRN: [id number]  HPI Here for one month of mild pain and swelling in the right ankle. 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 This requires her to be on her feet non-stop for 8 hours at a time. She wears supportive shoes and knee high compression stockings, but this does not stop the swelling. The swelling is more pronounced in the right foot then the left. No SOB.    Review of Systems  Constitutional: Negative.   Respiratory: Negative.   Cardiovascular: Positive for leg swelling. Negative for chest pain and palpitations.  Musculoskeletal: Positive for arthralgias.       Objective:   Physical Exam Constitutional:      Appearance: Normal appearance. She is not ill-appearing.  Cardiovascular:     Rate and Rhythm: Normal rate and regular rhythm.     Pulses: Normal pulses.     Heart sounds: Normal heart sounds.  Pulmonary:     Effort: Pulmonary effort is normal.     Breath sounds: Normal breath sounds.  Musculoskeletal:     Comments: The left foot and ankle are normal. The right foot is normal but there is slight swelling just anterior to the medial malleolus. This area is mildly tender as well. No warmth or erythema   Neurological:     Mental Status: She is alert.           Assessment & Plan:  Unilateral ankle swelling. I reassured her this is not from a cardiac issue, etc but instead is the result of some localized inflammation in the right ankle. She cannot take NSAIDs because she is on Eliquis. I suggested she ice the ankle after a shift at work and she may do better with a more supportive pair of shoes. Follow up prn.  Alysia Penna, MD

## 2020-03-17 DIAGNOSIS — Z932 Ileostomy status: Secondary | ICD-10-CM | POA: Diagnosis not present

## 2020-03-18 DIAGNOSIS — K51011 Ulcerative (chronic) pancolitis with rectal bleeding: Secondary | ICD-10-CM | POA: Diagnosis not present

## 2020-03-18 DIAGNOSIS — Z9049 Acquired absence of other specified parts of digestive tract: Secondary | ICD-10-CM | POA: Diagnosis not present

## 2020-03-18 DIAGNOSIS — K5190 Ulcerative colitis, unspecified, without complications: Secondary | ICD-10-CM | POA: Diagnosis not present

## 2020-03-23 DIAGNOSIS — D649 Anemia, unspecified: Secondary | ICD-10-CM | POA: Diagnosis not present

## 2020-03-23 DIAGNOSIS — J309 Allergic rhinitis, unspecified: Secondary | ICD-10-CM | POA: Diagnosis not present

## 2020-03-23 DIAGNOSIS — K51011 Ulcerative (chronic) pancolitis with rectal bleeding: Secondary | ICD-10-CM | POA: Diagnosis not present

## 2020-03-23 DIAGNOSIS — Z86718 Personal history of other venous thrombosis and embolism: Secondary | ICD-10-CM | POA: Diagnosis not present

## 2020-03-23 DIAGNOSIS — I82403 Acute embolism and thrombosis of unspecified deep veins of lower extremity, bilateral: Secondary | ICD-10-CM | POA: Diagnosis not present

## 2020-03-23 DIAGNOSIS — F329 Major depressive disorder, single episode, unspecified: Secondary | ICD-10-CM | POA: Diagnosis not present

## 2020-03-23 DIAGNOSIS — R52 Pain, unspecified: Secondary | ICD-10-CM | POA: Diagnosis not present

## 2020-03-23 DIAGNOSIS — Z01812 Encounter for preprocedural laboratory examination: Secondary | ICD-10-CM | POA: Diagnosis not present

## 2020-04-22 DIAGNOSIS — Z932 Ileostomy status: Secondary | ICD-10-CM | POA: Diagnosis not present

## 2020-05-11 DIAGNOSIS — Z932 Ileostomy status: Secondary | ICD-10-CM | POA: Diagnosis not present

## 2020-05-11 DIAGNOSIS — K5120 Ulcerative (chronic) proctitis without complications: Secondary | ICD-10-CM | POA: Diagnosis not present

## 2020-05-11 DIAGNOSIS — Z7901 Long term (current) use of anticoagulants: Secondary | ICD-10-CM | POA: Diagnosis not present

## 2020-05-11 DIAGNOSIS — K51811 Other ulcerative colitis with rectal bleeding: Secondary | ICD-10-CM | POA: Diagnosis not present

## 2020-05-11 DIAGNOSIS — R1084 Generalized abdominal pain: Secondary | ICD-10-CM | POA: Diagnosis not present

## 2020-05-11 DIAGNOSIS — K529 Noninfective gastroenteritis and colitis, unspecified: Secondary | ICD-10-CM | POA: Diagnosis not present

## 2020-05-11 DIAGNOSIS — Z432 Encounter for attention to ileostomy: Secondary | ICD-10-CM | POA: Diagnosis not present

## 2020-05-11 DIAGNOSIS — K5180 Other ulcerative colitis without complications: Secondary | ICD-10-CM | POA: Diagnosis not present

## 2020-05-11 DIAGNOSIS — K51011 Ulcerative (chronic) pancolitis with rectal bleeding: Secondary | ICD-10-CM | POA: Diagnosis not present

## 2020-05-11 DIAGNOSIS — I8290 Acute embolism and thrombosis of unspecified vein: Secondary | ICD-10-CM | POA: Diagnosis not present

## 2020-05-11 DIAGNOSIS — I82511 Chronic embolism and thrombosis of right femoral vein: Secondary | ICD-10-CM | POA: Diagnosis not present

## 2020-05-28 DIAGNOSIS — Z932 Ileostomy status: Secondary | ICD-10-CM | POA: Diagnosis not present

## 2020-05-31 ENCOUNTER — Other Ambulatory Visit: Payer: Self-pay | Admitting: Family Medicine

## 2020-06-10 DIAGNOSIS — H04123 Dry eye syndrome of bilateral lacrimal glands: Secondary | ICD-10-CM | POA: Diagnosis not present

## 2020-06-21 IMAGING — CT CT ABDOMEN AND PELVIS WITH CONTRAST
2 of 4 series · 16 of 46 positions shown, 18 images · IV contrast (Omni 300)
Comparison: 09/19/2018

CLINICAL DATA: Nausea and vomiting several days with lower
abdominal pain.

EXAM:
CT ABDOMEN AND PELVIS WITH CONTRAST
TECHNIQUE: Multidetector CT imaging of the abdomen and pelvis was performed
using the standard protocol following bolus administration of
intravenous contrast.
CONTRAST:  100mL OMNIPAQUE IOHEXOL 300 MG/ML  SOLN

[Series 3: a/p w/ 5mm · axial · 0.65mm/px · z∈[-633,-213]mm · 13 of 92 slices shown, 15 images]
[im 4/92  soft-tissue]
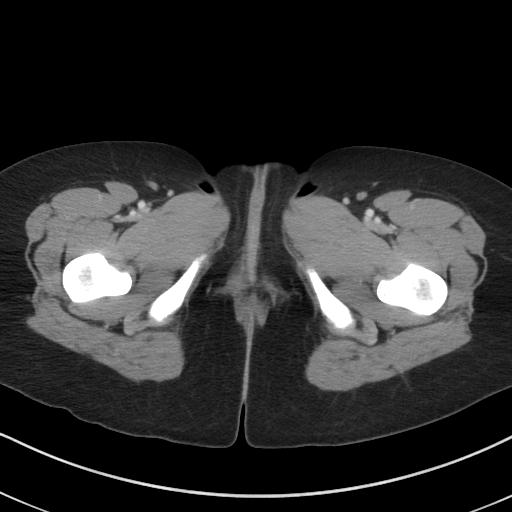
[im 4/92  bone]
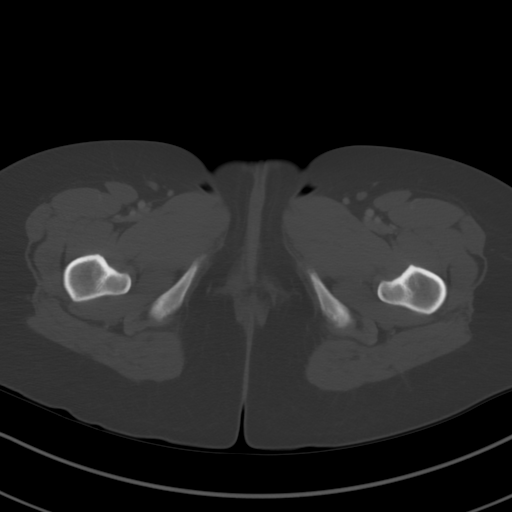
[im 11/92  soft-tissue]
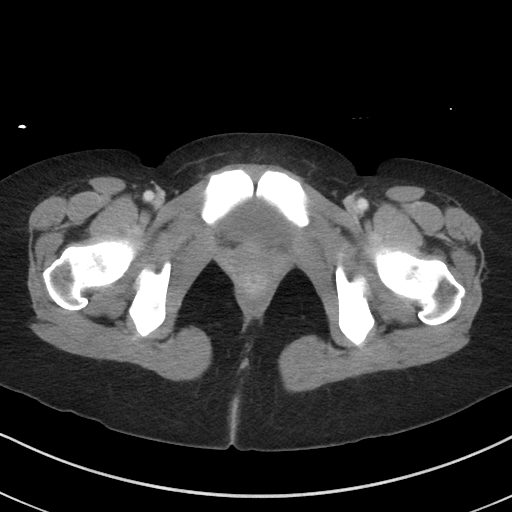
[im 19/92  soft-tissue]
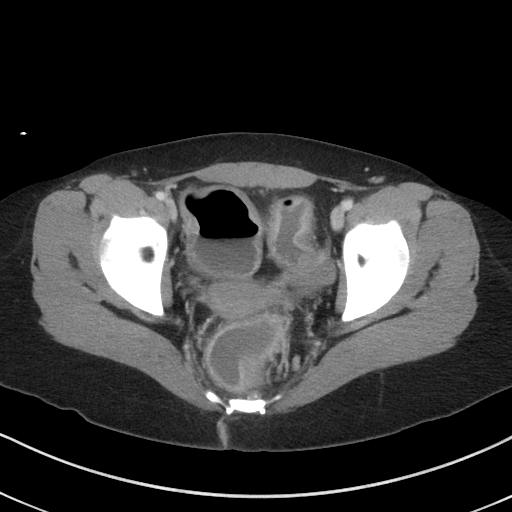
[im 26/92  soft-tissue]
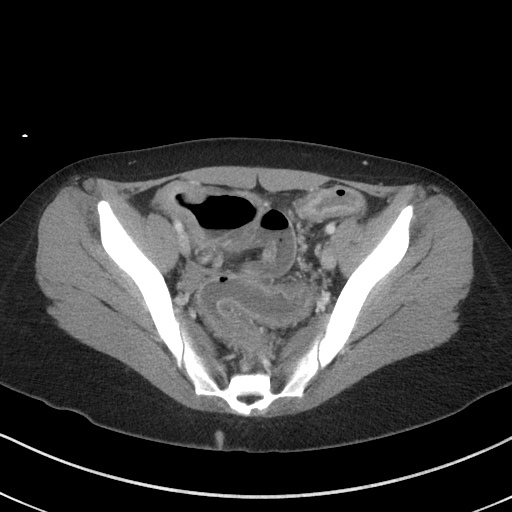
[im 33/92  soft-tissue]
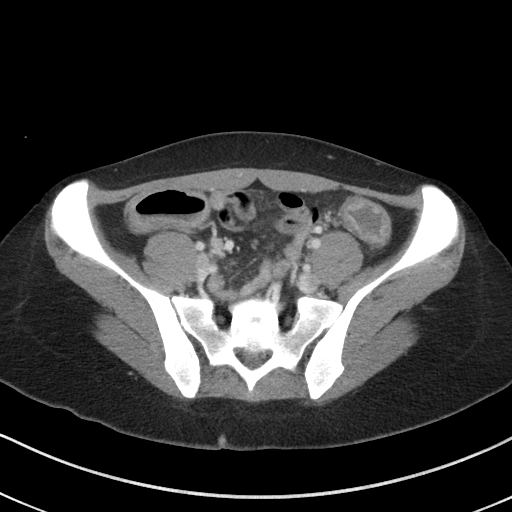
[im 41/92  soft-tissue]
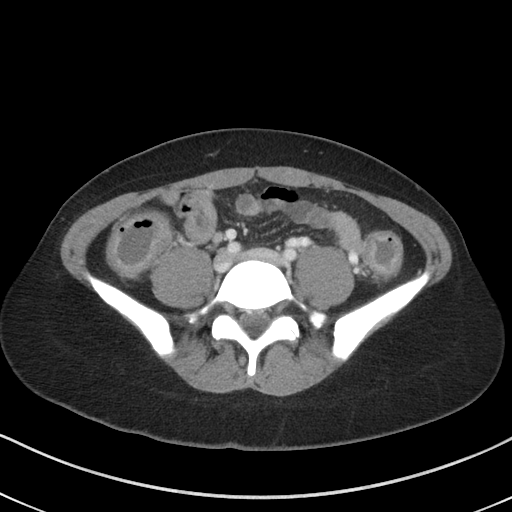
[im 48/92  soft-tissue]
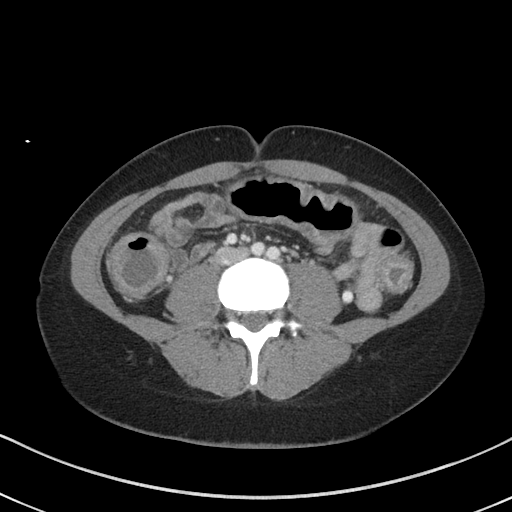
[im 51/92  soft-tissue]
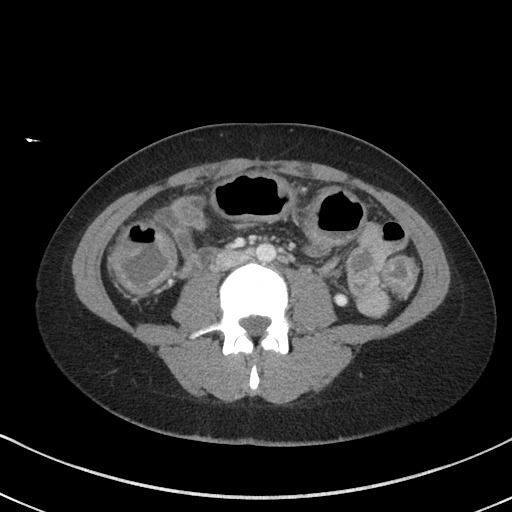
[im 59/92  soft-tissue]
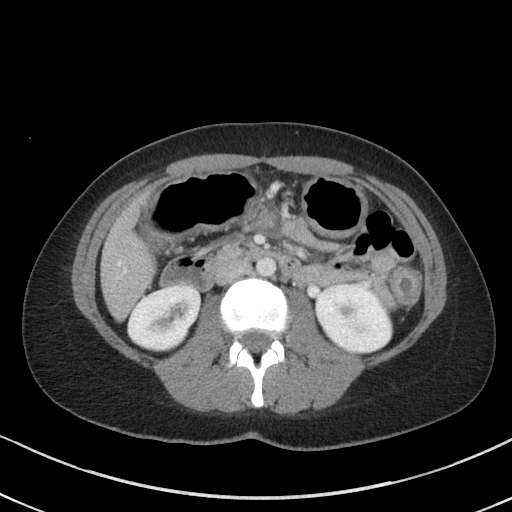
[im 59/92  bone]
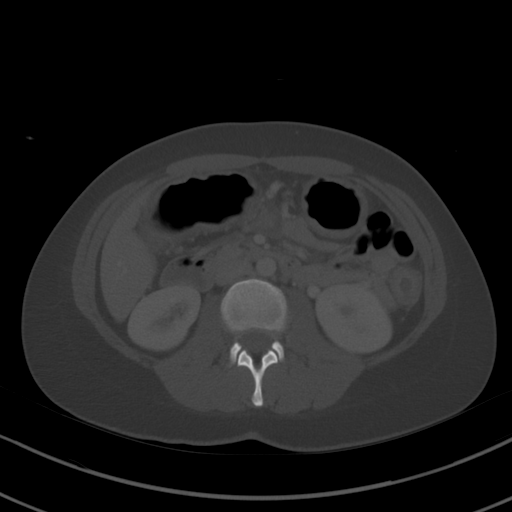
[im 66/92  soft-tissue]
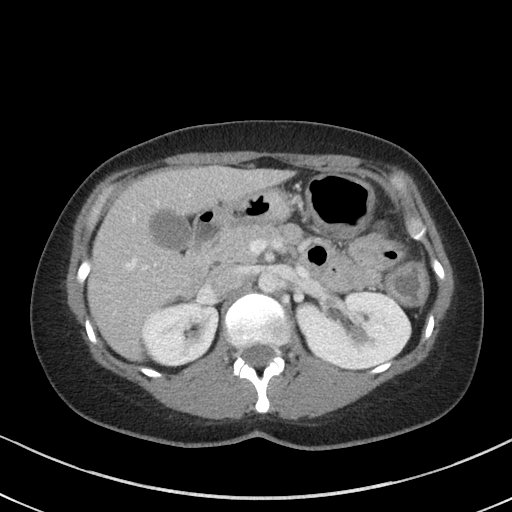
[im 73/92  soft-tissue]
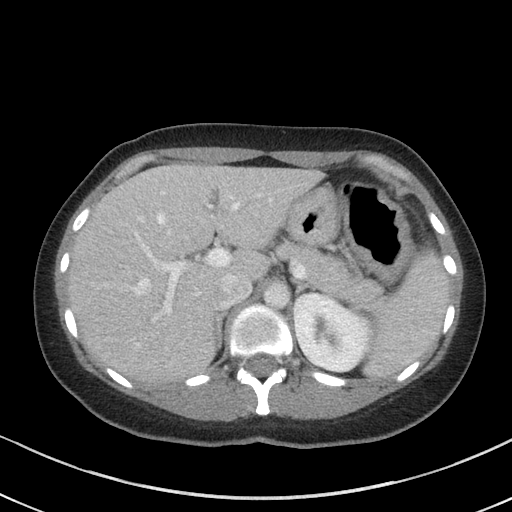
[im 81/92  soft-tissue]
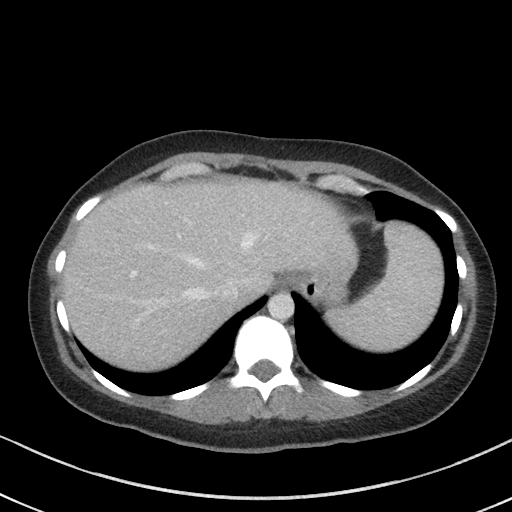
[im 88/92  soft-tissue]
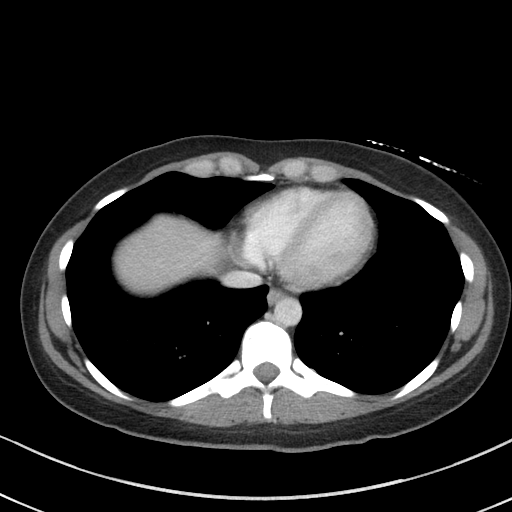

[Series 6: a/p w/ cor · coronal · 0.57mm/px · 3 of 145 slices shown]
[im 49/145  soft-tissue]
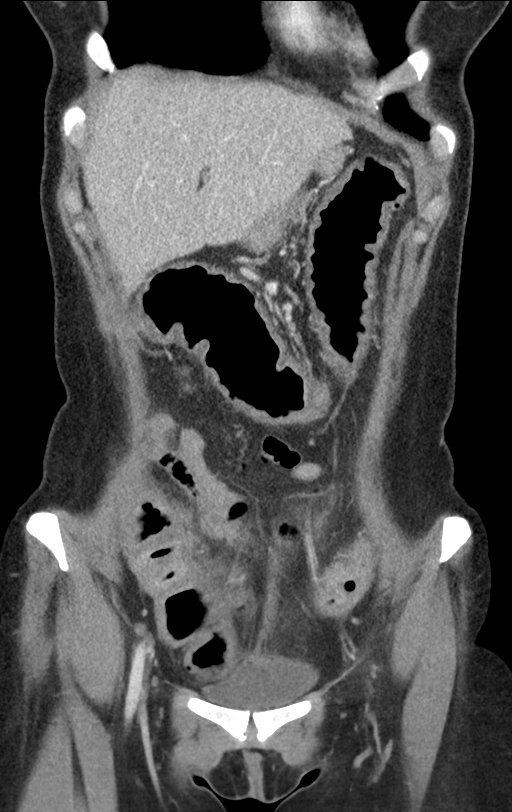
[im 65/145  soft-tissue]
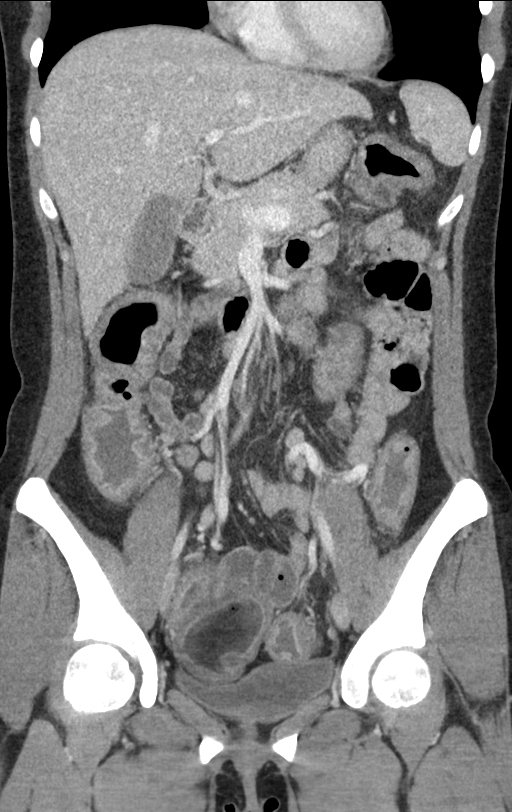
[im 81/145  soft-tissue]
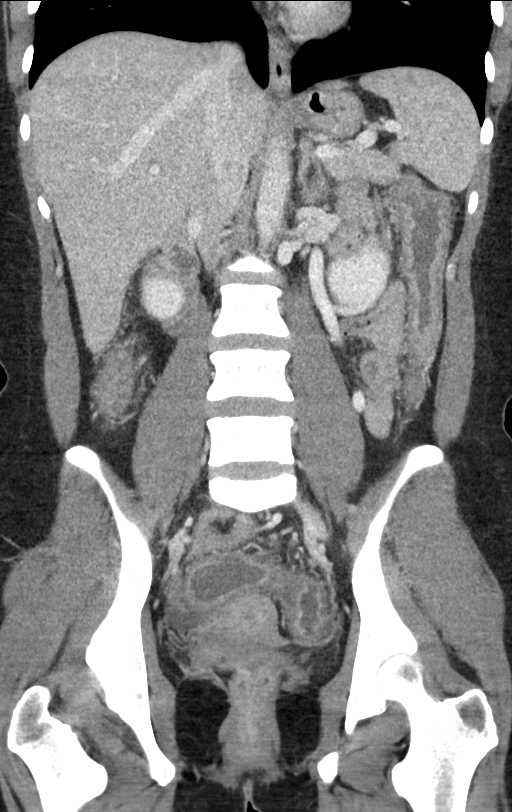

[16 of 46 positions shown; findings below may reference images not displayed]

FINDINGS: Lower chest: Lung bases are clear.

Hepatobiliary: Liver, gallbladder and biliary tree are normal.

Pancreas: Normal.

Spleen: Normal.

Adrenals/Urinary Tract: Adrenal glands are normal. Kidneys normal in
size without hydronephrosis or nephrolithiasis. Ureters and bladder
are normal.

Stomach/Bowel: Stomach and small bowel are within normal. Appendix
is normal. Interval worsening of wall thickening throughout the
colon with subtle adjacent inflammatory change compatible with
pancolitis of infectious or inflammatory nature.

Vascular/Lymphatic: Normal.

Reproductive: Normal.

Other: No significant free fluid.

Musculoskeletal: Normal.
IMPRESSION: Mild interval worsening of pancolitis likely of infectious or
inflammatory nature.

## 2020-07-17 DIAGNOSIS — Z932 Ileostomy status: Secondary | ICD-10-CM | POA: Diagnosis not present

## 2020-07-22 DIAGNOSIS — Z8719 Personal history of other diseases of the digestive system: Secondary | ICD-10-CM | POA: Diagnosis not present

## 2020-07-22 DIAGNOSIS — Z932 Ileostomy status: Secondary | ICD-10-CM | POA: Diagnosis not present

## 2020-07-23 IMAGING — CT CT ABDOMEN AND PELVIS WITH CONTRAST
2 of 4 series · 16 of 46 positions shown, 18 images · IV contrast (ISOVUE 300)
Comparison: 09/29/2018

CLINICAL DATA: Abdominal pain for over 1 month. Rectal bleeding.
Ulcerative colitis.

EXAM:
CT ABDOMEN AND PELVIS WITH CONTRAST
TECHNIQUE: Multidetector CT imaging of the abdomen and pelvis was performed
using the standard protocol following bolus administration of
intravenous contrast.
CONTRAST:  100mL OMNIPAQUE IOHEXOL 300 MG/ML  SOLN

[Series 2: abd/pel w · axial · 0.74mm/px · z∈[-468,-58]mm · 13 of 90 slices shown, 15 images]
[im 4/90  soft-tissue]
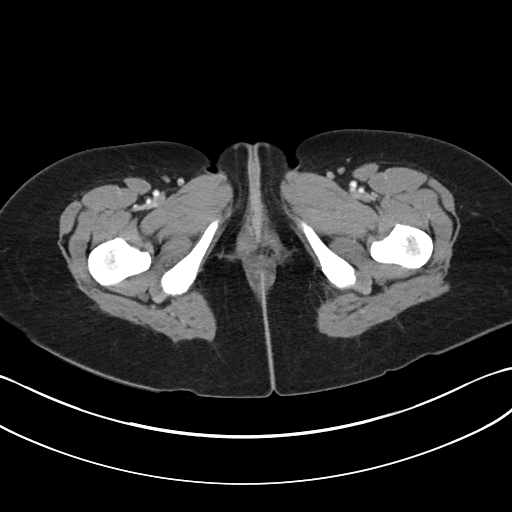
[im 4/90  bone]
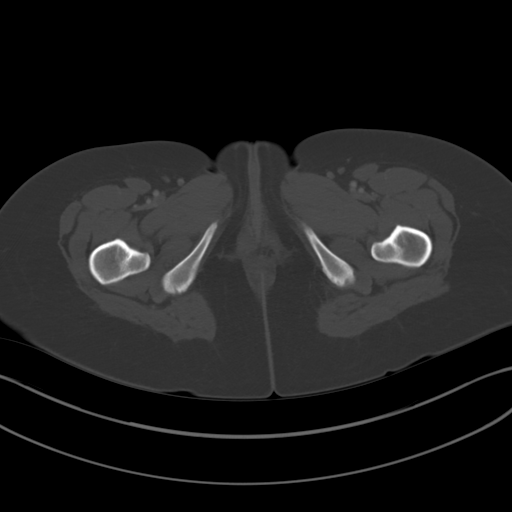
[im 11/90  soft-tissue]
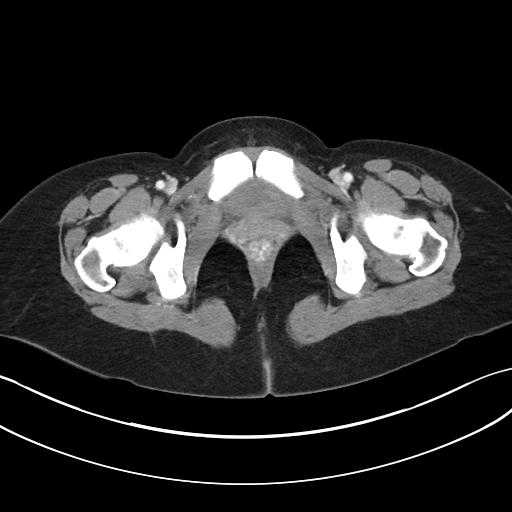
[im 18/90  soft-tissue]
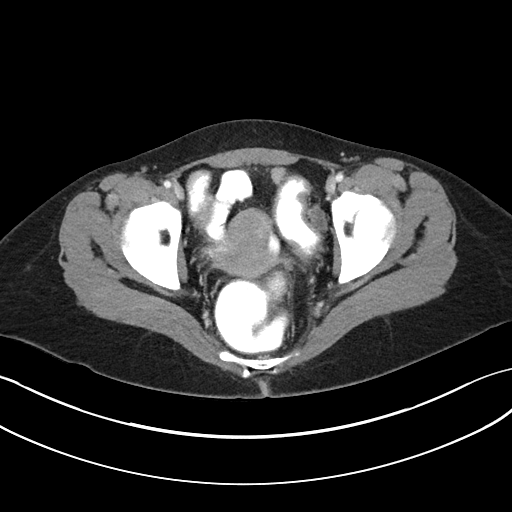
[im 25/90  soft-tissue]
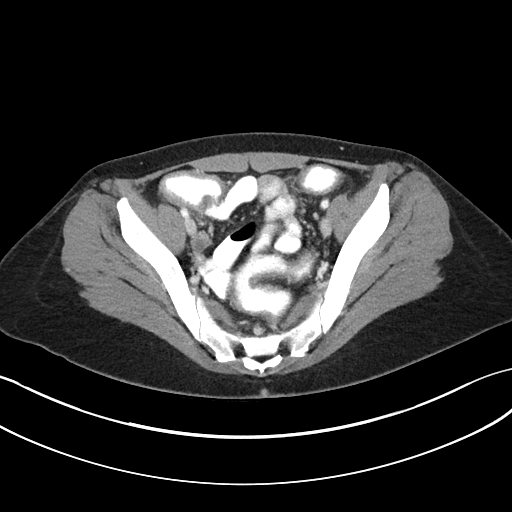
[im 33/90  soft-tissue]
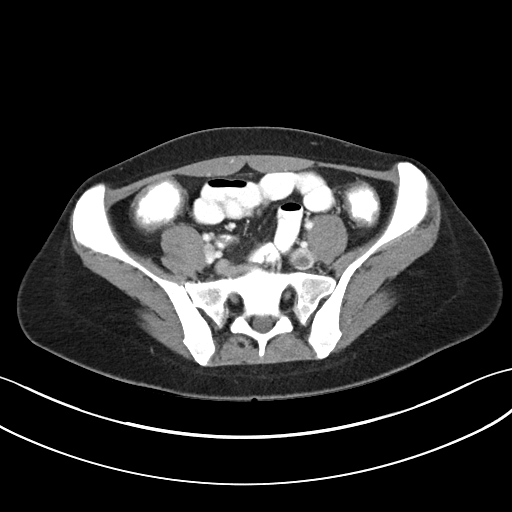
[im 40/90  soft-tissue]
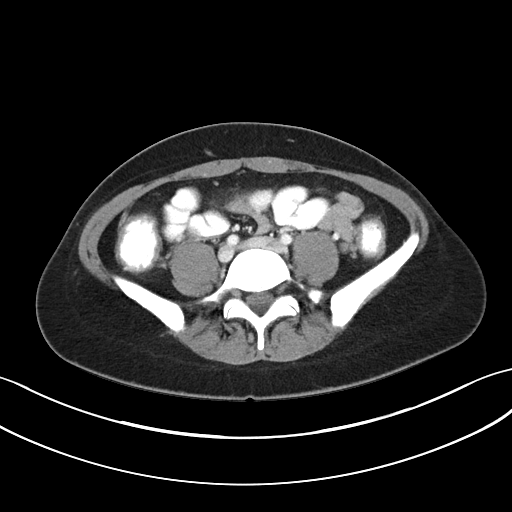
[im 47/90  soft-tissue]
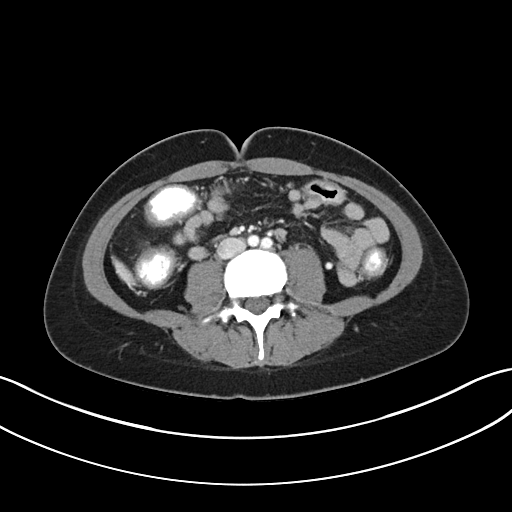
[im 50/90  soft-tissue]
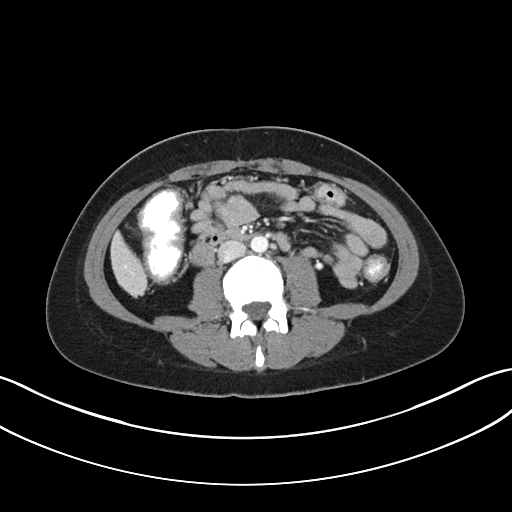
[im 57/90  soft-tissue]
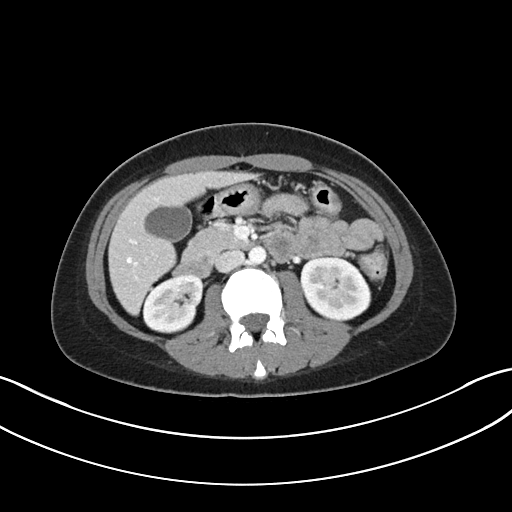
[im 57/90  bone]
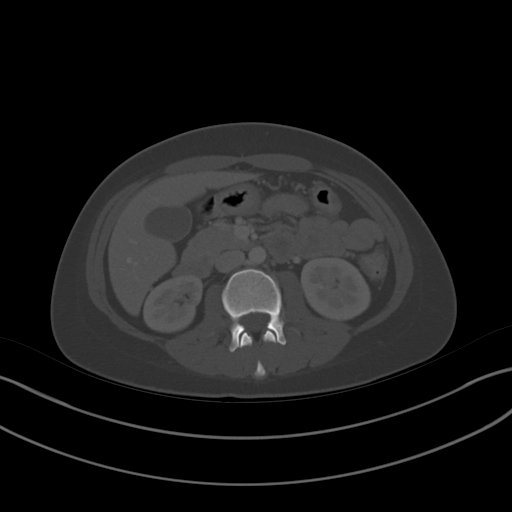
[im 65/90  soft-tissue]
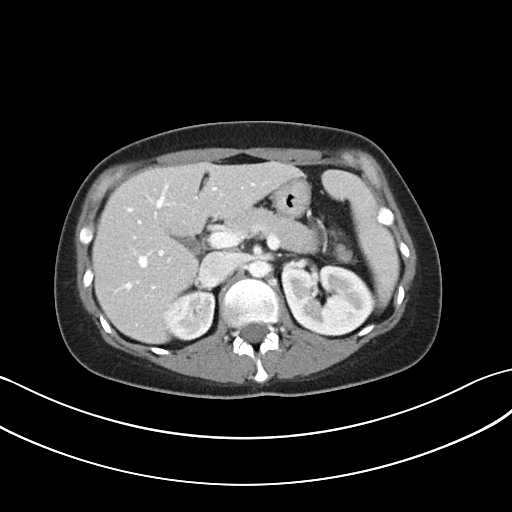
[im 72/90  soft-tissue]
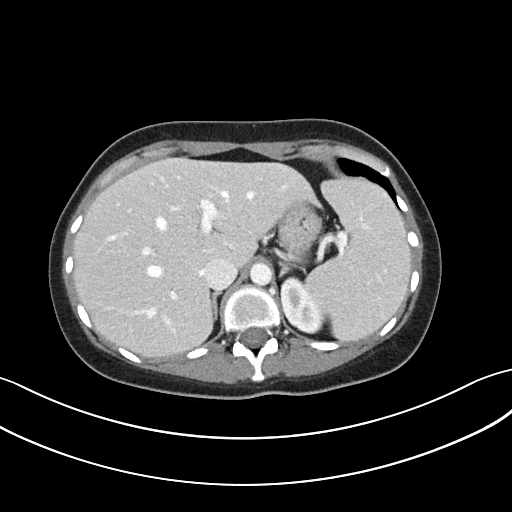
[im 79/90  soft-tissue]
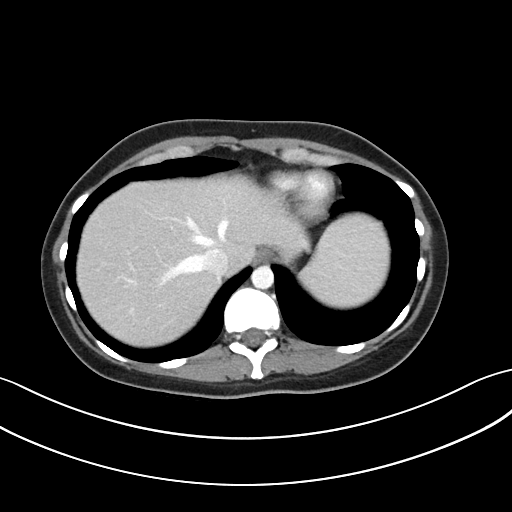
[im 86/90  soft-tissue]
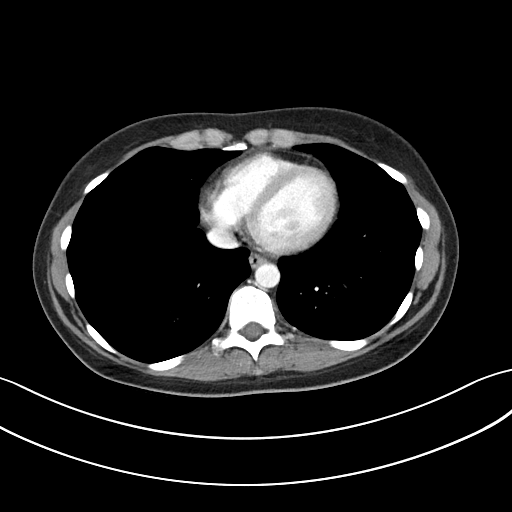

[Series 5: abd/pel w st · coronal · 0.72mm/px · 3 of 80 slices shown]
[im 27/80  soft-tissue]
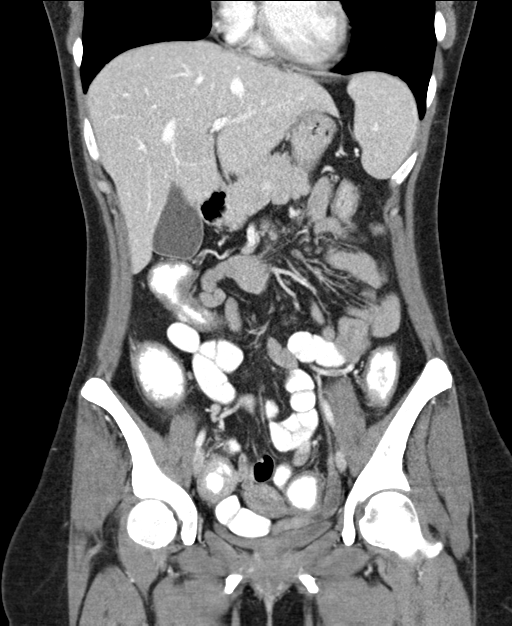
[im 36/80  soft-tissue]
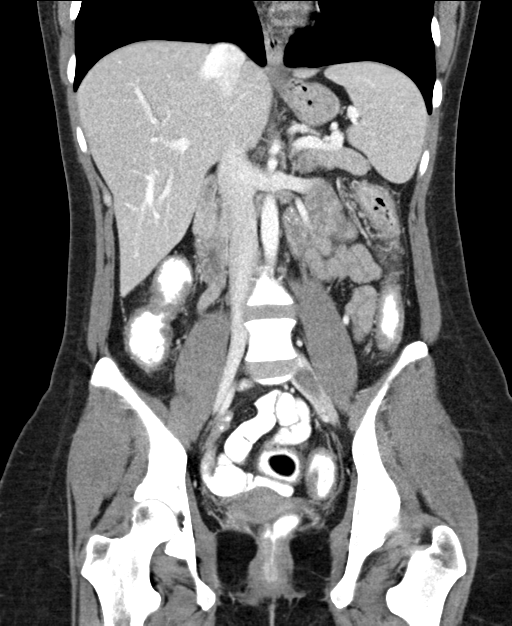
[im 44/80  soft-tissue]
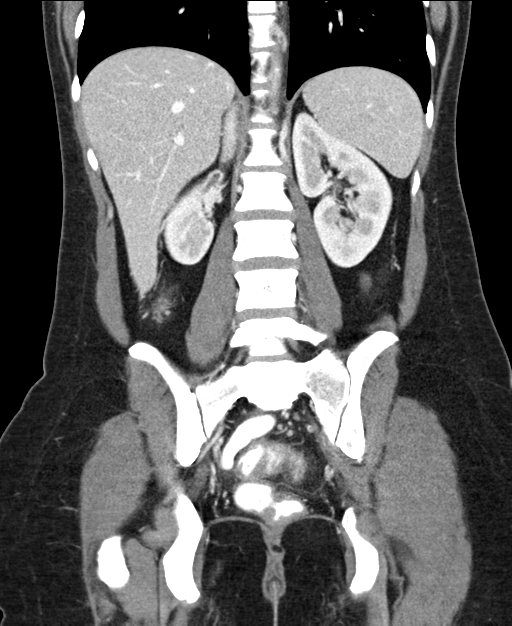

[16 of 46 positions shown; findings below may reference images not displayed]

FINDINGS: Lower Chest: No acute findings.

Hepatobiliary: No hepatic masses identified. Focal fatty
infiltration is seen in the central liver. Gallbladder is
unremarkable.

Pancreas:  No mass or inflammatory changes.

Spleen: Within normal limits in size and appearance.

Adrenals/Urinary Tract: No masses identified. No evidence of
hydronephrosis.

Stomach/Bowel: Diffuse colitis shows mild improvement since previous
study. No evidence of small bowel involvement. No evidence of
abscess or fistula.

Vascular/Lymphatic: No pathologically enlarged lymph nodes. No
abdominal aortic aneurysm. Acute DVT is seen in the left common and
internal iliac vein, new since prior exam.

Reproductive:  No mass or other significant abnormality.

Other:  None.

Musculoskeletal:  No suspicious bone lesions identified.
IMPRESSION: 1. Mild improvement in diffuse colitis since previous study. No
evidence of abscess or fistula.
2. Acute DVT in left common and internal iliac veins, new since
prior exam.

These results will be called to the ordering clinician or
representative by the Radiologist Assistant, and communication
documented in the PACS or zVision Dashboard.

## 2020-07-27 DIAGNOSIS — Z20822 Contact with and (suspected) exposure to covid-19: Secondary | ICD-10-CM | POA: Diagnosis not present

## 2020-08-17 DIAGNOSIS — Z8719 Personal history of other diseases of the digestive system: Secondary | ICD-10-CM | POA: Diagnosis not present

## 2020-08-17 DIAGNOSIS — Z932 Ileostomy status: Secondary | ICD-10-CM | POA: Diagnosis not present

## 2020-08-17 DIAGNOSIS — Z01818 Encounter for other preprocedural examination: Secondary | ICD-10-CM | POA: Diagnosis not present

## 2020-08-24 DIAGNOSIS — Z9889 Other specified postprocedural states: Secondary | ICD-10-CM | POA: Diagnosis not present

## 2020-08-24 DIAGNOSIS — G8918 Other acute postprocedural pain: Secondary | ICD-10-CM | POA: Diagnosis not present

## 2020-08-24 DIAGNOSIS — Z432 Encounter for attention to ileostomy: Secondary | ICD-10-CM | POA: Diagnosis not present

## 2020-08-24 DIAGNOSIS — Z7901 Long term (current) use of anticoagulants: Secondary | ICD-10-CM | POA: Diagnosis not present

## 2020-08-24 DIAGNOSIS — Z8719 Personal history of other diseases of the digestive system: Secondary | ICD-10-CM | POA: Diagnosis not present

## 2020-08-24 DIAGNOSIS — Z86718 Personal history of other venous thrombosis and embolism: Secondary | ICD-10-CM | POA: Diagnosis not present

## 2020-08-28 ENCOUNTER — Other Ambulatory Visit: Payer: Self-pay | Admitting: *Deleted

## 2020-08-28 NOTE — Patient Outreach (Signed)
[place] [facility]) Care Management  08/28/2020  Monica Ortega June 17, 1991 [id number]   Transition of care telephone call  Referral received:08/24/20 Initial outreach:08/28/20 Insurance: [facility]   Initial unsuccessful telephone call to patient's preferred number in order to complete transition of care assessment; no answer, left HIPAA compliant voicemail message requesting return call.   Objective: Per the electronic medical record, Monica Ortega  was hospitalized at [facility] for Closure of Loop Ileostomy  . Comorbidities include: Ulcerative colitis, total colectomy and end ileostomy on 11/01/19.  She was discharged to home on 07/26/20  without the need for home health services or durable medical equipment per the discharge summary.   Plan: This RNCM will route unsuccessful outreach letter with [place] Management pamphlet and 24 hour Nurse Advice Line Magnet to [place] Management clinical pool to be mailed to patient's home address. This RNCM will attempt another outreach within 4 business days.  Joylene Draft, RN, BSN  [place] Management Coordinator  [phone]- Mobile [phone]- Toll Free Main Office

## 2020-09-02 ENCOUNTER — Encounter: Payer: Self-pay | Admitting: *Deleted

## 2020-09-02 ENCOUNTER — Other Ambulatory Visit: Payer: Self-pay | Admitting: *Deleted

## 2020-09-02 NOTE — Patient Outreach (Signed)
[place] [facility]) Care Management  09/02/2020  Monica Ortega 1991/02/02 [id number]   Transition of care call/case closure   Referral received:08/24/20 Initial outreach:08/28/20 Insurance: [employer]    Subjective: 2nd attempt successful telephone call to patient's preferred number in order to complete transition of care assessment; 2 HIPAA identifiers verified. Explained purpose of call and completed transition of care assessment.  Monica Ortega states that she is doing pretty good , getting better each daly. She denies post-operative problems, says surgical incisions are unremarkable, states surgical pain well managed with prescribed medications. She tolerating diet denies nausea , advancing diet slowly focusing on light meals, low fiber and including protein. She reports having bowel movements that are becoming more formed, she reports noting a little blood in stool initially, reinforced notifying surgeon of concern related to blood in stools if noted again, discussed history of DVT and being on Eliquis. Her sister/Parents  are assisting with her  Recovery. She reports being independent with her daily care tolerating, walks in home.   Reviewed accessing the following [place] Benefits. She states that she does  have the hospital indemnity, Provided contact number for [employer] to file a claim as needed. She has made contact regarding Shortterm disability. Provided contact information for Employee assistance counseling program as benefit for support . She does not  use a [facility] outpatient pharmacy.      Objective:  Per the electronic medical record, Kimanh Templeman  was hospitalized at [facility] for Closure of Loop Ileostomy  . Comorbidities include: Ulcerative colitis, total colectomy and end ileostomy on 11/01/19, DVT on eliquis.  She was discharged to home on 07/26/20  without the need for home health services or durable medical equipment per the discharge  summary.    Assessment:  Patient voices good understanding of all discharge instructions.  See transition of care flowsheet for assessment details.   Plan:  Reviewed hospital discharge diagnosis of Closure of loop ileostomy   and discharge treatment plan using hospital discharge instructions, assessing medication adherence, reviewing problems requiring provider notification, and discussing the importance of follow up with surgeon, primary care provider and/or specialists as directed.  Review [place] healthy lifestyle program information to receive discounted premium for  2023   Step 1: Get  your annual physical  Step 2: Complete your health assessment  Step 3:Identify your current health status and complete the corresponding action step between July 11, 2020 and March 11, 2021.      No ongoing care management needs identified so will close case to [place] Management services and route successful outreach letter with [place] Management pamphlet and 24 Hour Nurse Line Magnet to [place] Management clinical pool to be mailed to patient's home address.  Thanked patient for their services to [facility].   Joylene Draft, RN, BSN  [place] Management Coordinator  [phone]- Mobile [phone]- Toll Free Main Office

## 2020-10-19 ENCOUNTER — Encounter: Payer: Self-pay | Admitting: Family Medicine

## 2020-10-22 ENCOUNTER — Emergency Department (HOSPITAL_COMMUNITY)
Admission: EM | Admit: 2020-10-22 | Discharge: 2020-10-22 | Disposition: A | Discharge status: Home / Self Care | Payer: 59 | Attending: Emergency Medicine | Admitting: Emergency Medicine

## 2020-10-22 ENCOUNTER — Encounter (HOSPITAL_COMMUNITY): Payer: Self-pay | Admitting: Emergency Medicine

## 2020-10-22 ENCOUNTER — Other Ambulatory Visit: Payer: Self-pay

## 2020-10-22 DIAGNOSIS — N751 Abscess of Bartholin's gland: Secondary | ICD-10-CM | POA: Diagnosis not present

## 2020-10-22 DIAGNOSIS — R102 Pelvic and perineal pain: Secondary | ICD-10-CM | POA: Diagnosis present

## 2020-10-22 DIAGNOSIS — Z7901 Long term (current) use of anticoagulants: Secondary | ICD-10-CM | POA: Diagnosis not present

## 2020-10-22 MED ORDER — LIDOCAINE-EPINEPHRINE (PF) 2 %-1:200000 IJ SOLN
10.0000 mL | Freq: Once | INTRAMUSCULAR | Status: AC
  Administered 2020-10-22: 10 mL
  Filled 2020-10-22: qty 20

## 2020-10-22 MED ORDER — SULFAMETHOXAZOLE-TRIMETHOPRIM 800-160 MG PO TABS: 1.0000 | ORAL_TABLET | Freq: Two times a day (BID) | ORAL | 0 refills | Status: AC

## 2020-10-22 NOTE — ED Triage Notes (Signed)
Patient reports pain and swelling at vulva x6 days.

## 2020-10-22 NOTE — Discharge Instructions (Signed)
  You have an abscess of bartholin's gland abscess  This was incised, drained. We placed a small amount of packing to prevent incision from closing right away. This may or may not stay in. After 48 hr please remove this thread out and start doing warm compresses. While in the shower, allow warm water on the area and massage to help remaining drainage.   Take bactrim morning and evening for 7 days  You may take ibuprofen or acetaminophen as needed for pain  Return to the ER for worsening swelling redness warmth pus fevers

## 2020-10-22 NOTE — ED Provider Notes (Signed)
[facility] DEPT Provider Note   CSN: [id number] Arrival date & time: 10/22/20  1335     History Chief Complaint  Patient presents with  . Vaginal Pain    Monica Ortega is a 30 y.o. female presents to the ED for evaluation of swelling, pain and redness on the right side of her vulva for the last 6 days.  She has done sits baths, warm water and heating pad compresses.  Reports she was walking to the ED she felt something draining from this area and reports the pain has improved.  Denies any fevers or chills.  No internal vaginal or pelvic pain.  HPI     Past Medical History:  Diagnosis Date  . Ankle sprain   . Anxiety   . Colitis   . Depression    denies hospitalization or medication  . DVT (deep venous thrombosis) ([place]) 10/31/2018  . DVT (deep venous thrombosis) ([place])   . Hyponatremia   . Universal ulcerative colitis with rectal bleeding ([place]) 10/31/2018    Patient Active Problem List   Diagnosis Date Noted  . Ulcerative colitis with complication ([place]) 90/24/0973  . DVT (deep venous thrombosis) ([place]) 10/31/2018  . Rectal bleed 10/31/2018  . Universal ulcerative colitis with rectal bleeding ([place]) 10/31/2018  . Hypokalemia 10/31/2018  . Hyponatremia 10/31/2018  . Abdominal pain 09/30/2018  . MDD (major depressive disorder), recurrent severe, without psychosis ([place]) 04/16/2015    Past Surgical History:  Procedure Laterality Date  . BIOPSY  10/01/2018   Procedure: BIOPSY;  Surgeon: Doran Stabler, MD;  Location: [facility];  Service: Endoscopy;;  . BIOPSY  11/01/2018   Procedure: BIOPSY;  Surgeon: Gatha Mayer, MD;  Location: [facility];  Service: Endoscopy;;  . COLONOSCOPY WITH PROPOFOL N/A 10/01/2018   Procedure: COLONOSCOPY WITH PROPOFOL;  Surgeon: Doran Stabler, MD;  Location: [facility];  Service: Endoscopy;  Laterality: N/A;  . COLONOSCOPY WITH PROPOFOL N/A 11/01/2018   Procedure: COLONOSCOPY WITH PROPOFOL;   Surgeon: Gatha Mayer, MD;  Location: [facility] ENDOSCOPY;  Service: Endoscopy;  Laterality: N/A;  . IVC FILTER REMOVAL N/A 02/06/2019   Procedure: IVC FILTER REMOVAL;  Surgeon: Waynetta Sandy, MD;  Location: [facility] CV LAB;  Service: Cardiovascular;  Laterality: N/A;  . VENA CAVA FILTER PLACEMENT N/A 10/31/2018   Procedure: INSERTION VENA-CAVA FILTER;  Surgeon: Rosetta Posner, MD;  [facility];  Service: Vascular;  Laterality: N/A;  . WISDOM TOOTH EXTRACTION       OB History   No obstetric history on file.     Family History  Problem Relation Age of Onset  . Diabetes Mother   . Hypertension Father   . Heart disease Maternal Aunt        enlarged heart  . Healthy Sister   . Dementia Maternal Grandmother   . Heart disease Maternal Grandfather   . Breast cancer Paternal Aunt   . Colon cancer Neg Hx   . Esophageal cancer Neg Hx   . Stomach cancer Neg Hx   . Pancreatic cancer Neg Hx   . Liver disease Neg Hx   . Inflammatory bowel disease Neg Hx     Social History   Tobacco Use  . Smoking status: Never Smoker  . Smokeless tobacco: Never Used  Vaping Use  . Vaping Use: Never used  Substance Use Topics  . Alcohol use: No    Comment: denied alcohol  . Drug use: No  Comment: denied    Home Medications Prior to Admission medications   Medication Sig Start Date End Date Taking? Authorizing Provider  sulfamethoxazole-trimethoprim (BACTRIM DS) 800-160 MG tablet Take 1 tablet by mouth 2 (two) times daily for 7 days. 10/22/20 10/29/20 Yes Kinnie Feil, PA-C  ACCU-CHEK AVIVA PLUS test strip USE AS DIRECTED 04/15/19   [name], DO  acetaminophen (ACETAMINOPHEN EXTRA STRENGTH) 500 MG tablet Take 1,000 mg by mouth every 6 (six) hours as needed for headache (pain).     [provider]  blood glucose meter kit and supplies KIT Use as directed 01/22/19   [name] R, DO  ELIQUIS 5 MG TABS tablet TAKE 1 TABLET BY MOUTH TWICE A DAY 06/01/20   Caren Macadam, MD  Lancets MISC 1 each by Does not apply route as directed. 01/22/19   [name], DO  loperamide (IMODIUM) 2 MG capsule Take 2-4 mg by mouth 2 (two) times daily as needed for diarrhea or loose stools.     [provider]  ondansetron (ZOFRAN) 8 MG tablet Take 1 tablet (8 mg total) by mouth every 8 (eight) hours as needed for nausea. 01/03/19   Mercy Riding, MD  pantoprazole (PROTONIX) 40 MG tablet Take 1 tablet (40 mg total) by mouth daily. 01/04/19   Mercy Riding, MD  promethazine (PHENERGAN) 25 MG tablet Take 1 tablet (25 mg total) by mouth every 6 (six) hours as needed for nausea or vomiting. 10/26/18   Cirigliano, Vito V, DO  Simethicone (GAS-X PO) Take 1 capsule by mouth daily as needed (flatulence).    [provider]    Allergies    Patient has no known allergies.  Review of Systems   Review of Systems  Skin: Positive for color change (swelling, drainaing).  All other systems reviewed and are negative.   Physical Exam Updated Vital Signs BP 125/88 (BP Location: Right Arm)   Pulse 88   Temp 98.3 F (36.8 C) (Oral)   Resp 18   SpO2 100%   Physical Exam Exam conducted with a chaperone present.  Constitutional:      Appearance: She is well-developed.  HENT:     Head: Normocephalic.     Nose: Nose normal.  Eyes:     General: Lids are normal.  Cardiovascular:     Rate and Rhythm: Normal rate.  Pulmonary:     Effort: Pulmonary effort is normal. No respiratory distress.  Genitourinary:    Vagina: Tenderness present.       Comments:  Fluctuant, tender, erythematous area approx 3 x 2 cm consistent with right bartholin cyst abscess, spontaneously draining purulent drainage from small opening.  Internal bimanual exam without obvious involvement of deeper vaginal structures. No involvement of perineum or perianal skin  Musculoskeletal:        General: Normal range of motion.     Cervical back: Normal range of motion.  Neurological:     Mental  Status: She is alert.  Psychiatric:        Behavior: Behavior normal.     ED Results / Procedures / Treatments   Labs (all labs ordered are listed, but only abnormal results are displayed) Labs Reviewed - No data to display  EKG None  Radiology No results found.  Procedures Procedures   Medications Ordered in ED Medications  lidocaine-EPINEPHrine (XYLOCAINE W/EPI) 2 %-1:200000 (PF) injection 10 mL (has no administration in time range)    ED Course  I have  reviewed the triage vital signs and the nursing notes.  Pertinent labs & imaging results that were available during my care of the patient were reviewed by me and considered in my medical decision making (see chart for details).    MDM Rules/Calculators/A&P                          Exam consistent with right bartholin cyst abscess. No systemic symptoms. Abscess spontaneously draining here.  I&D done in the ER to facilitate complete drainage and prevent recollection, packing placed.  Will discharge with antibiotics. Patient instructed to remove packing in 48 hr at home and start warm water compresses. NSAIDs. Return precautions given. Patient in agreement.   Final Clinical Impression(s) / ED Diagnoses Final diagnoses:  Bartholin's gland abscess    Rx / DC Orders ED Discharge Orders         Ordered    sulfamethoxazole-trimethoprim (BACTRIM DS) 800-160 MG tablet  2 times daily        10/22/20 1527           Arlean Hopping 10/22/20 1538    Sherwood Gambler, MD 10/22/20 1753

## 2021-02-12 ENCOUNTER — Other Ambulatory Visit: Payer: Self-pay

## 2021-02-12 ENCOUNTER — Encounter: Payer: Self-pay | Admitting: Family Medicine

## 2021-02-12 ENCOUNTER — Ambulatory Visit (INDEPENDENT_AMBULATORY_CARE_PROVIDER_SITE_OTHER): Payer: 59 | Admitting: Family Medicine

## 2021-02-12 VITALS — BP 110/80 | HR 64 | Temp 97.8°F | Ht 63.5 in | Wt 157.7 lb

## 2021-02-12 DIAGNOSIS — D649 Anemia, unspecified: Secondary | ICD-10-CM

## 2021-02-12 DIAGNOSIS — Z0000 Encounter for general adult medical examination without abnormal findings: Secondary | ICD-10-CM

## 2021-02-12 DIAGNOSIS — R41840 Attention and concentration deficit: Secondary | ICD-10-CM

## 2021-02-12 DIAGNOSIS — Z86718 Personal history of other venous thrombosis and embolism: Secondary | ICD-10-CM | POA: Diagnosis not present

## 2021-02-12 DIAGNOSIS — N920 Excessive and frequent menstruation with regular cycle: Secondary | ICD-10-CM

## 2021-02-12 DIAGNOSIS — R7301 Impaired fasting glucose: Secondary | ICD-10-CM | POA: Diagnosis not present

## 2021-02-12 DIAGNOSIS — Z13220 Encounter for screening for lipoid disorders: Secondary | ICD-10-CM | POA: Diagnosis not present

## 2021-02-12 LAB — LIPID PANEL
Cholesterol: 158 mg/dL (ref 0–200)
HDL: 64.7 mg/dL (ref >39.00)
LDL Cholesterol: 83 mg/dL (ref 0–99)
NonHDL: 93.34
Total CHOL/HDL Ratio: 2
Triglycerides: 52 mg/dL (ref 0.0–149.0)
VLDL: 10.4 mg/dL (ref 0.0–40.0)

## 2021-02-12 LAB — COMPREHENSIVE METABOLIC PANEL
ALT: 13 U/L (ref 0–35)
AST: 17 U/L (ref 0–37)
Albumin: 4 g/dL (ref 3.5–5.2)
Alkaline Phosphatase: 65 U/L (ref 39–117)
BUN: 11 mg/dL (ref 6–23)
CO2: 27 mEq/L (ref 19–32)
Calcium: 9.2 mg/dL (ref 8.4–10.5)
Chloride: 104 mEq/L (ref 96–112)
Creatinine, Ser: 0.79 mg/dL (ref 0.40–1.20)
GFR: 98.86 mL/min (ref >60.00)
Glucose, Bld: 78 mg/dL (ref 70–99)
Potassium: 3.8 mEq/L (ref 3.5–5.1)
Sodium: 138 mEq/L (ref 135–145)
Total Bilirubin: 0.7 mg/dL (ref 0.2–1.2)
Total Protein: 7.2 g/dL (ref 6.0–8.3)

## 2021-02-12 LAB — TSH: TSH: 3 u[IU]/mL (ref 0.35–5.50)

## 2021-02-12 LAB — FERRITIN: Ferritin: 18.1 ng/mL (ref 10.0–291.0)

## 2021-02-12 LAB — HEMOGLOBIN A1C: Hgb A1c MFr Bld: 5.7 % (ref 4.6–6.5)

## 2021-02-12 NOTE — Addendum Note (Signed)
Addended by: Octavio Manns E on: 02/12/2021 09:59 AM   Modules accepted: Orders

## 2021-02-12 NOTE — Progress Notes (Signed)
Monica Ortega DOB: 1991/03/31 Encounter date: 02/12/2021  This is a 30 y.o. female who presents for complete physical   History of present illness/Additional concerns:  Has been doing well. Legs feeling well.   Bowels are doing pretty well. Using imodium, metamucil and gas x and this helps.   Following with Dr. Pamala Hurry for gyn needs.   Exercises a few days/week. Cardio and bike and some weight lifting.   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   Adjusting to having periods again. Cramping is significant sometimes. Period lasts 5 days. Has to change twice on heavy days. Sometimes will have clot.   Past Medical History:  Diagnosis Date   Ankle sprain    Anxiety    Colitis    Depression    denies hospitalization or medication   DVT (deep venous thrombosis) ([place]) 10/31/2018   DVT (deep venous thrombosis) ([facility])    Hyponatremia    Universal ulcerative colitis with rectal bleeding ([place]) 10/31/2018   Past Surgical History:  Procedure Laterality Date   BIOPSY  10/01/2018   Procedure: BIOPSY;  Surgeon: Doran Stabler, MD;  Location: [facility];  Service: Endoscopy;;   BIOPSY  11/01/2018   Procedure: BIOPSY;  Surgeon: Gatha Mayer, MD;  Location: [facility];  Service: Endoscopy;;   COLONOSCOPY WITH PROPOFOL N/A 10/01/2018   Procedure: COLONOSCOPY WITH PROPOFOL;  Surgeon: Doran Stabler, MD;  Location: [facility] ENDOSCOPY;  Service: Endoscopy;  Laterality: N/A;   COLONOSCOPY WITH PROPOFOL N/A 11/01/2018   Procedure: COLONOSCOPY WITH PROPOFOL;  Surgeon: Gatha Mayer, MD;  Location: [facility];  Service: Endoscopy;  Laterality: N/A;   IVC FILTER REMOVAL N/A 02/06/2019   Procedure: IVC FILTER REMOVAL;  Surgeon: Waynetta Sandy, MD;  Location: [facility] CV LAB;  Service: Cardiovascular;  Laterality: N/A;   VENA CAVA FILTER PLACEMENT N/A 10/31/2018   Procedure: INSERTION VENA-CAVA FILTER;  Surgeon: Rosetta Posner, MD;  [facility];  Service: Vascular;   Laterality: N/A;   WISDOM TOOTH EXTRACTION     No Known Allergies Current Meds  Medication Sig   acetaminophen (TYLENOL) 500 MG tablet Take 1,000 mg by mouth every 6 (six) hours as needed for headache (pain).    cetirizine (ZYRTEC) 10 MG tablet Take 10 mg by mouth daily.   loperamide (IMODIUM) 2 MG capsule Take 2-4 mg by mouth 2 (two) times daily as needed for diarrhea or loose stools.    Multiple Vitamin (MULTIVITAMIN) tablet Take 1 tablet by mouth daily.   Omega-3 Fatty Acids (FISH OIL PO) Take by mouth daily.   Psyllium (METAMUCIL PO) Take by mouth in the morning and at bedtime.   Simethicone (GAS-X PO) Take 1 capsule by mouth daily as needed (flatulence).   Current Facility-Administered Medications for the 02/12/21 encounter (Office Visit) with Caren Macadam, MD  Medication   0.9 %  sodium chloride infusion   Social History   Tobacco Use   Smoking status: Never   Smokeless tobacco: Never  Substance Use Topics   Alcohol use: No    Comment: denied alcohol   Family History  Problem Relation Age of Onset   Diabetes Mother    Hypertension Father    Heart disease Maternal Aunt        enlarged heart   Healthy Sister    Dementia Maternal Grandmother    Heart disease Maternal Grandfather    Breast cancer Paternal Aunt    Colon cancer Neg Hx  Esophageal cancer Neg Hx    Stomach cancer Neg Hx    Pancreatic cancer Neg Hx    Liver disease Neg Hx    Inflammatory bowel disease Neg Hx      Review of Systems  Constitutional:  Negative for activity change, appetite change, chills, fatigue, fever and unexpected weight change.  HENT:  Negative for congestion, ear pain, hearing loss, sinus pressure, sinus pain, sore throat and trouble swallowing.   Eyes:  Negative for pain and visual disturbance.  Respiratory:  Negative for cough, chest tightness, shortness of breath and wheezing.   Cardiovascular:  Negative for chest pain, palpitations and leg swelling.  Gastrointestinal:   Negative for abdominal pain, blood in stool, constipation, diarrhea, nausea and vomiting.  Genitourinary:  Negative for difficulty urinating and menstrual problem.  Musculoskeletal:  Negative for arthralgias and back pain.  Skin:  Negative for rash.  Neurological:  Negative for dizziness, weakness, numbness and headaches.  Hematological:  Negative for adenopathy. Does not bruise/bleed easily.  Psychiatric/Behavioral:  Positive for decreased concentration (always been an issues; notes more now; can't read book, hard to stay focused). Negative for sleep disturbance and suicidal ideas. The patient is not nervous/anxious.    CBC:  Lab Results  Component Value Date   WBC 8.3 10/08/2019   HGB 12.2 10/08/2019   HCT 37.8 10/08/2019   MCH 23.8 (L) 01/03/2019   MCHC 32.1 10/08/2019   RDW 18.2 (H) 10/08/2019   PLT 332.0 10/08/2019   CMP: Lab Results  Component Value Date   NA 133 (L) 10/08/2019   K 4.3 10/08/2019   CL 100 10/08/2019   CO2 29 10/08/2019   ANIONGAP 10 01/03/2019   GLUCOSE 198 (H) 10/08/2019   BUN 15 10/08/2019   CREATININE 0.95 10/08/2019   GFRAA >60 01/03/2019   CALCIUM 9.1 10/08/2019   PROT 6.5 10/08/2019   BILITOT 0.5 10/08/2019   ALKPHOS 43 10/08/2019   ALT 11 10/08/2019   AST 9 10/08/2019   LIPID: Lab Results  Component Value Date   CHOL 177 08/28/2019   TRIG 127.0 08/28/2019   HDL 65.50 08/28/2019   LDLCALC 87 08/28/2019    Objective:  BP 110/80 (BP Location: Left Arm, Patient Position: Sitting, Cuff Size: Normal)   Pulse 64   Temp 97.8 F (36.6 C) (Oral)   Ht 5' 3.5" (1.613 m)   Wt 157 lb 11.2 oz (71.5 kg)   LMP 02/02/2021 (Exact Date)   SpO2 99%   BMI 27.50 kg/m   Weight: 157 lb 11.2 oz (71.5 kg)   BP Readings from Last 3 Encounters:  02/12/21 110/80  10/22/20 125/88  03/03/20 130/60   Wt Readings from Last 3 Encounters:  02/12/21 157 lb 11.2 oz (71.5 kg)  03/03/20 168 lb 3.2 oz (76.3 kg)  08/28/19 143 lb 6.4 oz (65 kg)    Physical  Exam Constitutional:      General: She is not in acute distress.    Appearance: She is well-developed.  HENT:     Head: Normocephalic and atraumatic.     Right Ear: External ear normal.     Left Ear: External ear normal.     Mouth/Throat:     Pharynx: No oropharyngeal exudate.  Eyes:     Conjunctiva/sclera: Conjunctivae normal.     Pupils: Pupils are equal, round, and reactive to light.  Neck:     Thyroid: No thyromegaly.  Cardiovascular:     Rate and Rhythm: Normal rate and regular rhythm.  Heart sounds: Normal heart sounds. No murmur heard.   No friction rub. No gallop.  Pulmonary:     Effort: Pulmonary effort is normal.     Breath sounds: Normal breath sounds.  Abdominal:     General: Bowel sounds are normal. There is no distension.     Palpations: Abdomen is soft. There is no mass.     Tenderness: There is no abdominal tenderness. There is no guarding.     Hernia: No hernia is present.  Musculoskeletal:        General: No tenderness or deformity. Normal range of motion.     Cervical back: Normal range of motion and neck supple.  Lymphadenopathy:     Cervical: No cervical adenopathy.  Skin:    General: Skin is warm and dry.     Findings: No rash.     Comments: Mole upper back - this has been present for years; per patient no change. See image  Neurological:     Mental Status: She is alert and oriented to person, place, and time.     Deep Tendon Reflexes: Reflexes normal.     Reflex Scores:      Tricep reflexes are 2+ on the right side and 2+ on the left side.      Bicep reflexes are 2+ on the right side and 2+ on the left side.      Brachioradialis reflexes are 2+ on the right side and 2+ on the left side.      Patellar reflexes are 2+ on the right side and 2+ on the left side. Psychiatric:        Speech: Speech normal.        Behavior: Behavior normal.        Thought Content: Thought content normal.     Assessment/Plan: Health Maintenance Due  Topic Date  Due   Pneumococcal Vaccine 11-60 Years old (1 - PCV) Never done   Hepatitis C Screening  Never done   PAP SMEAR-Modifier  07/11/2018   INFLUENZA VACCINE  02/08/2021   Health Maintenance reviewed.  1. Preventative health care Keep up with regular exercise and healthy eating.  - Hepatitis C antibody; Future  2. Anemia, unspecified type Hypochromic, microcytic.   3. Lipid screening - Lipid panel; Future  4. Impaired fasting glucose - Comprehensive metabolic panel; Future - Hemoglobin A1c; Future  5. Menorrhagia with regular cycle - Iron and TIBC; Future - Ferritin; Future - TSH; Future  6. Personal history of DVT (deep vein thrombosis) - Prothrombin gene mutation; Future - PROTEIN C AG + PROTEIN S AG; Future - Factor 5 leiden; Future - Antithrombin III; Future - Antiphospholipid Syndrome Comp; Future  7. Attention and concentration deficit Can set up evaluation on her own for this and then follow up after.   Return in about 1 year (around 02/12/2022) for physical exam.  Micheline Rough, MD

## 2021-02-12 NOTE — Patient Instructions (Signed)
You can call for ADHD evaluation: [facility] is who I usually recommend. May have significant wait time. Ok to forward results to me after this is completed.

## 2021-02-25 LAB — ANTIPHOSPHOLIPID SYNDROME COMP
APTT: 26.3 s
Anticardiolipin Ab, IgA: <10 [APL'U]
Anticardiolipin Ab, IgG: <10 [GPL'U]
Anticardiolipin Ab, IgM: <10 [MPL'U]
Antiphosphatidylserine IgG: 9 {GPS'U}
Antiphosphatidylserine IgM: 3 {MPS'U}
Antiprothrombin Antibody, IgG: 2 G units
Beta-2 Glycoprotein I, IgA: <10 SAU
Beta-2 Glycoprotein I, IgG: <10 SGU
Beta-2 Glycoprotein I, IgM: <10 SMU
DRVVT Screen Seconds: 35.8 s
Hexagonal Phospholipid Neutral: 0 s
Platelet Neutralization: 0 s

## 2021-02-25 LAB — PROTEIN C AG + PROTEIN S AG
Protein C Antigen: 81 % (ref 60–150)
Protein S Ag, Free: 82 % (ref 61–136)
Protein S Ag, Total: 74 % (ref 60–150)

## 2021-02-25 LAB — IRON AND TIBC
Iron Saturation: 30 % (ref 15–55)
Iron: 92 ug/dL (ref 27–159)
Total Iron Binding Capacity: 310 ug/dL (ref 250–450)
UIBC: 218 ug/dL (ref 131–425)

## 2021-02-26 LAB — EXTRA SPECIMEN

## 2021-02-26 LAB — HEPATITIS C ANTIBODY
Hepatitis C Ab: NONREACTIVE
SIGNAL TO CUT-OFF: 0.01 (ref <1.00)

## 2021-02-26 LAB — PROTHROMBIN GENE MUTATION: PROTHROMBIN (FACTOR II): NEGATIVE

## 2021-02-26 LAB — FACTOR 5 LEIDEN: Result: NEGATIVE

## 2021-02-26 LAB — ANTITHROMBIN III: AntiThromb III Func: 111 % normal (ref 80–135)

## 2021-03-30 DIAGNOSIS — K51011 Ulcerative (chronic) pancolitis with rectal bleeding: Secondary | ICD-10-CM | POA: Diagnosis not present

## 2021-04-29 ENCOUNTER — Telehealth: Payer: Self-pay | Admitting: Family Medicine

## 2021-04-29 NOTE — Telephone Encounter (Signed)
Patient is requesting a TB test.   Patient could be contacted at [phone].  Please advise.

## 2021-04-30 ENCOUNTER — Ambulatory Visit: Payer: 59 | Admitting: Family Medicine

## 2021-04-30 NOTE — Telephone Encounter (Signed)
Patient informed of the message below.  Patient stated she needs this for her job and would prefer a TB skin test.  Appt scheduled for 10/24 and the patient is aware to return on Wednesday for skin test reading.

## 2021-04-30 NOTE — Telephone Encounter (Signed)
If she wants the tb test placed; ok for nurse visit. If she wants blood test, ok to order quanteferon gold. If this is not for screening purposes (ie she was exposed to TB) then she needs virtual visit to discuss.

## 2021-05-03 ENCOUNTER — Other Ambulatory Visit: Payer: Self-pay

## 2021-05-03 ENCOUNTER — Ambulatory Visit (INDEPENDENT_AMBULATORY_CARE_PROVIDER_SITE_OTHER): Payer: 59

## 2021-05-03 DIAGNOSIS — Z111 Encounter for screening for respiratory tuberculosis: Secondary | ICD-10-CM

## 2021-05-05 ENCOUNTER — Other Ambulatory Visit: Payer: Self-pay

## 2021-05-05 ENCOUNTER — Ambulatory Visit: Payer: 59 | Admitting: Family Medicine

## 2021-05-05 ENCOUNTER — Ambulatory Visit (INDEPENDENT_AMBULATORY_CARE_PROVIDER_SITE_OTHER): Payer: 59

## 2021-05-05 DIAGNOSIS — Z111 Encounter for screening for respiratory tuberculosis: Secondary | ICD-10-CM | POA: Diagnosis not present

## 2021-05-05 LAB — TB SKIN TEST
Induration: 0 mm
TB Skin Test: NEGATIVE

## 2021-05-05 NOTE — Progress Notes (Signed)
Pt presented for reading of TB screening that she requested be done for work purposes. Site with negative induration.

## 2021-05-27 DIAGNOSIS — F338 Other recurrent depressive disorders: Secondary | ICD-10-CM | POA: Diagnosis not present

## 2021-05-27 DIAGNOSIS — R41840 Attention and concentration deficit: Secondary | ICD-10-CM | POA: Diagnosis not present

## 2021-05-27 DIAGNOSIS — F419 Anxiety disorder, unspecified: Secondary | ICD-10-CM | POA: Diagnosis not present

## 2021-06-24 ENCOUNTER — Other Ambulatory Visit (HOSPITAL_COMMUNITY): Payer: Self-pay

## 2021-06-24 MED ORDER — VYVANSE 20 MG PO CAPS
20.0000 mg | ORAL_CAPSULE | Freq: Every day | ORAL | 0 refills | Status: DC
  Filled 2021-06-24: qty 30, 30d supply, fill #0

## 2021-06-26 ENCOUNTER — Other Ambulatory Visit (HOSPITAL_COMMUNITY): Payer: Self-pay

## 2021-07-06 ENCOUNTER — Telehealth (INDEPENDENT_AMBULATORY_CARE_PROVIDER_SITE_OTHER): Payer: 59 | Admitting: Family Medicine

## 2021-07-06 ENCOUNTER — Telehealth: Payer: Self-pay | Admitting: Family Medicine

## 2021-07-06 ENCOUNTER — Encounter: Payer: Self-pay | Admitting: Family Medicine

## 2021-07-06 VITALS — Temp 98.3°F

## 2021-07-06 DIAGNOSIS — U071 COVID-19: Secondary | ICD-10-CM | POA: Diagnosis not present

## 2021-07-06 MED ORDER — BENZONATATE 100 MG PO CAPS: ORAL_CAPSULE | ORAL | 0 refills | Status: DC

## 2021-07-06 NOTE — Progress Notes (Signed)
Virtual Visit via Telephone Note  I connected with Monica Ortega on 07/06/21 at 11:40 AM EST by telephone and verified that I am speaking with the correct person using two identifiers.   I discussed the limitations, risks, security and privacy concerns of performing an evaluation and management service by telephone and the availability of in person appointments. I also discussed with the patient that there may be a patient responsible charge related to this service. The patient expressed understanding and agreed to proceed.  Location patient: home, [place] Location provider: work or home office Participants present for the call: patient, provider Patient did not have a visit with me in the prior 7 days to address this/these issue(s).   History of Present Illness:  Acute telemedicine visit for for Covid19: -Onset: 2-3 days ago; tested positive for covid -Symptoms include: sore throat, fevers on and off, cough -Denies: CP, SOB, NVD -Pertinent past medical history: see below -Pertinent medication allergies:No Known Allergies -COVID-19 vaccine status:  2 doses and 1 booster Immunization History  Administered Date(s) Administered   Influenza,inj,Quad PF,6+ Mos 04/17/2015, 05/31/2017, 04/12/2018, 05/01/2019, 04/14/2021   Influenza-Unspecified 04/12/2018   PPD Test 05/03/2021   Tdap 10/20/2014  -denies any chance of pregnancy, FDLMP last week    Past Medical History:  Diagnosis Date   Ankle sprain    Anxiety    Colitis    Depression    denies hospitalization or medication   DVT (deep venous thrombosis) ([place]) 10/31/2018   DVT (deep venous thrombosis) ([facility])    Hyponatremia    Universal ulcerative colitis with rectal bleeding ([place]) 10/31/2018    Observations/Objective: Patient sounds cheerful and well on the phone. I do not appreciate any SOB. Speech and thought processing are grossly intact. Patient reported vitals:  Assessment and Plan:  COVID-19   Discussed treatment  options, potential complications, isolation and precautions for COVID-19. After discussion, the patient opted for treatment with symptomatic care. Tessalon Rx sent.  Other symptomatic care measures summarized in patient instructions. Work/School slipped offered: provided in patient instructions   Advised to seek prompt in person care if worsening, new symptoms arise, or if is not improving with treatment. Advised of options for care.  Follow Up Instructions:  I did not refer this patient for an OV with me in the next 24 hours for this/these issue(s).  I discussed the assessment and treatment plan with the patient. The patient was provided an opportunity to ask questions and all were answered. The patient agreed with the plan and demonstrated an understanding of the instructions.   I spent 15 minutes on the date of this visit in the care of this patient. See summary of tasks completed to properly care for this patient in the detailed notes above which also included counseling of above, review of PMH, medications, allergies, evaluation of the patient and ordering and/or  instructing patient on testing and care options.     [name], DO

## 2021-07-06 NOTE — Patient Instructions (Addendum)
° ° ° °--------------------------------------------------------------------------------------------------------------------------- ° ° ° °  WORK SLIP:  Patient Monica Ortega,  1990/07/30, was seen for a medical visit today, 07/06/21 . Please excuse from work for a COVID/flu like illness. If Covid19 testing is positive advise 10 days minimum from the onset of symptoms (07/03/21) PLUS 1 day of no fever and improved symptoms. Will defer to employer for a sooner return to work if patient has 2 negative covid tests 48 hours apart and is feeling better, or if symptoms have resolved, it is greater than 5 days since the positive test and the patient can wear a high-quality, tight fitting mask such as N95 or KN95 at all times for an additional 5 days. Would also suggest COVID19 antigen testing is negative prior to early return from a Covid illness.  Sincerely: E-signature: Dr. [name], DO [facility] Ph: [phone]   ------------------------------------------------------------------------------------------------------------------------------   HOME CARE TIPS:  -I sent the medication(s) we discussed to your pharmacy: Meds ordered this encounter  Medications   benzonatate (TESSALON PERLES) 100 MG capsule    Sig: 1-2 capsules up to twice daily as needed for cough    Dispense:  30 capsule    Refill:  0     -can use tylenol if needed for fevers, aches and pains per instructions  -can use nasal saline a few times per day if you have nasal congestion; sometimes  a short course of Afrin nasal spray for 3 days can help with symptoms as well  -stay hydrated, drink plenty of fluids and eat small healthy meals - avoid dairy  -can take 1000 IU (21mg) Vit D3 and 100-500 mg of Vit C daily per instructions  -If the Covid test is positive, [facility] for more information on home care, transmission and treatment for COVID19  -follow up with your doctor in 2-3 days  unless improving and feeling better  -stay home while sick, except to seek medical care. If you have COVID19, you will likely be contagious for 7-10 days. Flu or Influenza is likely contagious for about 7 days. Other respiratory viral infections remain contagious for 5-10+ days depending on the virus and many other factors. Wear a good mask that fits snugly (such as N95 or KN95) if around others to reduce the risk of transmission.  It was nice to meet you today, and I really hope you are feeling better soon. I help [facility] out with telemedicine visits on Tuesdays and Thursdays and am happy to help if you need a follow up virtual visit on those days. Otherwise, if you have any concerns or questions following this visit please schedule a follow up visit with your Primary Care doctor or seek care at a local urgent care clinic to avoid delays in care.    Seek in person care or schedule a follow up video visit promptly if your symptoms worsen, new concerns arise or you are not improving with treatment. Call 911 and/or seek emergency care if your symptoms are severe or life threatening.

## 2021-07-06 NOTE — Telephone Encounter (Signed)
Noted  

## 2021-07-06 NOTE — Telephone Encounter (Signed)
Patient called in to get an appointment for testing positive yesterday. Patient would like to be seen, I have her scheduled for Monica Ortega today

## 2021-08-03 ENCOUNTER — Other Ambulatory Visit (HOSPITAL_COMMUNITY): Payer: Self-pay

## 2021-08-03 MED ORDER — VYVANSE 20 MG PO CAPS
ORAL_CAPSULE | ORAL | 0 refills | Status: DC
  Filled 2021-08-03: qty 30, 30d supply, fill #0

## 2021-08-10 DIAGNOSIS — F902 Attention-deficit hyperactivity disorder, combined type: Secondary | ICD-10-CM | POA: Diagnosis not present

## 2021-08-10 DIAGNOSIS — Z79899 Other long term (current) drug therapy: Secondary | ICD-10-CM | POA: Diagnosis not present

## 2021-08-17 DIAGNOSIS — H5213 Myopia, bilateral: Secondary | ICD-10-CM | POA: Diagnosis not present

## 2021-08-23 ENCOUNTER — Other Ambulatory Visit (HOSPITAL_COMMUNITY): Payer: Self-pay

## 2021-08-23 MED ORDER — VYVANSE 40 MG PO CAPS
ORAL_CAPSULE | ORAL | 0 refills | Status: DC
  Filled 2021-08-23: qty 30, 30d supply, fill #0

## 2021-08-25 ENCOUNTER — Other Ambulatory Visit (HOSPITAL_COMMUNITY): Payer: Self-pay

## 2021-09-16 ENCOUNTER — Ambulatory Visit: Payer: 59 | Attending: Internal Medicine

## 2021-09-16 DIAGNOSIS — Z23 Encounter for immunization: Secondary | ICD-10-CM

## 2021-09-17 ENCOUNTER — Other Ambulatory Visit (HOSPITAL_BASED_OUTPATIENT_CLINIC_OR_DEPARTMENT_OTHER): Payer: Self-pay

## 2021-09-17 MED ORDER — PFIZER COVID-19 VAC BIVALENT 30 MCG/0.3ML IM SUSP
INTRAMUSCULAR | 0 refills | Status: AC
  Filled 2021-09-17: qty 0.3, 1d supply, fill #0

## 2021-09-17 NOTE — Progress Notes (Signed)
? ?  [facility] ? ?Name:  Monica Ortega    ?MRN: [id number] ?DOB: May 08, 1991 ? ?09/17/2021 ? ?[name] was observed post Covid-19 immunization for 15 minutes without incident. She was provided with Vaccine Information Sheet and instruction to access the V-Safe system.  ? ?[name] was instructed to call 911 with any severe reactions post vaccine: ?Difficulty breathing  ?Swelling of face and throat  ?A fast heartbeat  ?A bad rash all over body  ?Dizziness and weakness  ? ?Immunizations Administered   ? ? Name Date Dose VIS Date Route  ? Ambulance person Booster 09/16/2021 11:32 AM 0.3 mL 03/10/2021 Intramuscular  ? Manufacturer: [place]: [phone]  ? [place]: [phone]  ? ?  ? ? ?

## 2021-09-22 ENCOUNTER — Other Ambulatory Visit (HOSPITAL_COMMUNITY): Payer: Self-pay

## 2021-09-22 MED ORDER — VYVANSE 40 MG PO CAPS
40.0000 mg | ORAL_CAPSULE | Freq: Every day | ORAL | 0 refills | Status: DC
  Filled 2021-09-22: qty 30, 30d supply, fill #0

## 2021-10-29 ENCOUNTER — Other Ambulatory Visit (HOSPITAL_COMMUNITY): Payer: Self-pay

## 2021-10-29 MED ORDER — VYVANSE 40 MG PO CAPS
40.0000 mg | ORAL_CAPSULE | Freq: Every day | ORAL | 0 refills | Status: DC
  Filled 2021-10-29: qty 30, 30d supply, fill #0

## 2021-11-04 DIAGNOSIS — F902 Attention-deficit hyperactivity disorder, combined type: Secondary | ICD-10-CM | POA: Diagnosis not present

## 2021-11-04 DIAGNOSIS — Z79899 Other long term (current) drug therapy: Secondary | ICD-10-CM | POA: Diagnosis not present

## 2021-11-17 ENCOUNTER — Encounter: Payer: Self-pay | Admitting: Family Medicine

## 2021-11-17 ENCOUNTER — Ambulatory Visit: Payer: 59 | Admitting: Family Medicine

## 2021-11-17 VITALS — BP 112/76 | HR 68 | Temp 98.1°F | Wt 144.0 lb

## 2021-11-17 DIAGNOSIS — L259 Unspecified contact dermatitis, unspecified cause: Secondary | ICD-10-CM | POA: Diagnosis not present

## 2021-11-17 MED ORDER — TRIAMCINOLONE ACETONIDE 0.1 % EX CREA: 1.0000 "application " | TOPICAL_CREAM | Freq: Three times a day (TID) | CUTANEOUS | 1 refills | Status: DC

## 2021-11-17 NOTE — Progress Notes (Signed)
? ?  Subjective:  ? ? Patient ID: Monica Ortega, female    DOB: [birth year], 31 y.o.   MRN: [id number] ? ?HPI ?Here for one week of red areas of skin on the fingers of her right hand. She has never had this before. The areas itch slightly but are not painful. No other skin lesions. She cannot think of a recent exposure to something out of the ordinary.  ? ? ?Review of Systems  ?Constitutional: Negative.   ?Respiratory: Negative.    ?Cardiovascular: Negative.   ?Musculoskeletal:  Negative for arthralgias.  ?Skin:  Positive for rash.  ? ?   ?Objective:  ? Physical Exam ?Constitutional:   ?   General: She is not in acute distress. ?   Appearance: Normal appearance.  ?Cardiovascular:  ?   Rate and Rhythm: Normal rate and regular rhythm.  ?   Pulses: Normal pulses.  ?   Heart sounds: Normal heart sounds.  ?Pulmonary:  ?   Effort: Pulmonary effort is normal.  ?   Breath sounds: Normal breath sounds.  ?Skin: ?   Comments: The dorsal surfaces of fingers 2-5 of the right hand at the nail bases have areas of raised erythema and a few small papules. These are not tender.   ?Neurological:  ?   Mental Status: She is alert.  ? ? ? ? ? ?   ?Assessment & Plan:  ?Contact dermatitis. Treat with Triamcinolone cream TID. Recheck as needed. ?Alysia Penna, MD ? ? ?

## 2021-12-07 ENCOUNTER — Other Ambulatory Visit (HOSPITAL_COMMUNITY): Payer: Self-pay

## 2021-12-07 MED ORDER — VYVANSE 60 MG PO CHEW
1.0000 | CHEWABLE_TABLET | Freq: Every day | ORAL | 0 refills | Status: DC
  Filled 2021-12-07: qty 30, 30d supply, fill #0

## 2022-01-12 ENCOUNTER — Other Ambulatory Visit (HOSPITAL_COMMUNITY): Payer: Self-pay

## 2022-01-12 MED ORDER — VYVANSE 60 MG PO CHEW
1.0000 | CHEWABLE_TABLET | Freq: Every day | ORAL | 0 refills | Status: DC
  Filled 2022-01-12: qty 30, 30d supply, fill #0

## 2022-02-03 ENCOUNTER — Other Ambulatory Visit (HOSPITAL_COMMUNITY): Payer: Self-pay

## 2022-02-03 DIAGNOSIS — F902 Attention-deficit hyperactivity disorder, combined type: Secondary | ICD-10-CM | POA: Diagnosis not present

## 2022-02-03 DIAGNOSIS — Z79899 Other long term (current) drug therapy: Secondary | ICD-10-CM | POA: Diagnosis not present

## 2022-02-03 MED ORDER — VYVANSE 60 MG PO CHEW
1.0000 | CHEWABLE_TABLET | Freq: Every day | ORAL | 0 refills | Status: DC
  Filled 2022-02-03: qty 30, 30d supply, fill #0

## 2022-02-05 ENCOUNTER — Other Ambulatory Visit (HOSPITAL_COMMUNITY): Payer: Self-pay

## 2022-02-14 ENCOUNTER — Encounter: Payer: 59 | Admitting: Family Medicine

## 2022-02-17 ENCOUNTER — Encounter: Payer: Self-pay | Admitting: Family Medicine

## 2022-02-17 ENCOUNTER — Other Ambulatory Visit (HOSPITAL_COMMUNITY): Payer: Self-pay

## 2022-02-17 ENCOUNTER — Ambulatory Visit (INDEPENDENT_AMBULATORY_CARE_PROVIDER_SITE_OTHER): Payer: 59 | Admitting: Family Medicine

## 2022-02-17 DIAGNOSIS — Z23 Encounter for immunization: Secondary | ICD-10-CM | POA: Diagnosis not present

## 2022-02-17 DIAGNOSIS — Z7185 Encounter for immunization safety counseling: Secondary | ICD-10-CM

## 2022-02-17 MED ORDER — VYVANSE 50 MG PO CHEW
1.0000 | CHEWABLE_TABLET | Freq: Every day | ORAL | 0 refills | Status: DC
  Filled 2022-02-17: qty 30, 30d supply, fill #0

## 2022-02-17 NOTE — Assessment & Plan Note (Signed)
Patient is agreeable to getting guardasil series. Will see her back in office for her pap smear/ well woman exam.

## 2022-02-17 NOTE — Progress Notes (Signed)
Established Patient Office Visit  Subjective   Patient ID: Monica Ortega, female    DOB: 09-Nov-1990  Age: 31 y.o. MRN: [id number]  Chief Complaint  Patient presents with   Establish Care    Patient is here for transition of care visit. States that she see [facility] for her ADHD. States her medication is working well for her at this time. She has no new symptoms or issues to report.  We reviewed her health maintenance guidelines. She is overdue for her pap smear, states she has not had one done in several years. Is agreeable to scheduling with me for a well woman exam.   We reviewed her immunizations and we discussed getting her up to date on the guardasil vaccination series. Pt states she has never had an abnormal pap. She is agreeable to starting the series today.  I reviewed her past medical history listed below. She reports a history of UC and had colectomy, she no longer has her ileostomy, was reversed last year. She reports no current issues with her bowel.     Patient Active Problem List   Diagnosis Date Noted   Immunization due 02/17/2022   Ulcerative colitis with complication ([place]) 64/40/3474   DVT (deep venous thrombosis) ([place]) 10/31/2018   Rectal bleed 10/31/2018   Universal ulcerative colitis with rectal bleeding ([place]) 10/31/2018   Hypokalemia 10/31/2018   Hyponatremia 10/31/2018   Abdominal pain 09/30/2018   MDD (major depressive disorder), recurrent severe, without psychosis ([place]) 04/16/2015      Review of Systems  All other systems reviewed and are negative.     Objective:     BP 110/84 (BP Location: Left Arm, Patient Position: Sitting, Cuff Size: Normal)   Pulse 67   Temp (!) 97.5 F (36.4 C) (Oral)   Ht 5' 3.5" (1.613 m)   Wt 149 lb 1.6 oz (67.6 kg)   LMP 02/12/2022 (Exact Date)   SpO2 98%   BMI 26.00 kg/m  BP Readings from Last 3 Encounters:  02/17/22 110/84  11/17/21 112/76  02/12/21 110/80      Physical  Exam Vitals reviewed.  Constitutional:      Appearance: Normal appearance. She is well-groomed and normal weight.  HENT:     Head: Normocephalic and atraumatic.     Right Ear: Tympanic membrane and ear canal normal.     Left Ear: Tympanic membrane and ear canal normal.     Mouth/Throat:     Mouth: Mucous membranes are moist.     Pharynx: Oropharynx is clear.  Eyes:     Extraocular Movements: Extraocular movements intact.     Conjunctiva/sclera: Conjunctivae normal.     Pupils: Pupils are equal, round, and reactive to light.  Cardiovascular:     Rate and Rhythm: Normal rate and regular rhythm.     Heart sounds: S1 normal and S2 normal.  Pulmonary:     Effort: Pulmonary effort is normal.     Breath sounds: Normal breath sounds and air entry.  Abdominal:     General: Abdomen is flat. Bowel sounds are normal.     Palpations: Abdomen is soft.  Musculoskeletal:        General: Normal range of motion.     Cervical back: Normal range of motion and neck supple.     Right lower leg: No edema.     Left lower leg: No edema.  Skin:    General: Skin is warm and dry.  Neurological:  Mental Status: She is alert and oriented to person, place, and time. Mental status is at baseline.     Gait: Gait is intact.  Psychiatric:        Mood and Affect: Mood and affect normal.        Speech: Speech normal.        Behavior: Behavior normal.        Judgment: Judgment normal.      No results found for any visits on 02/17/22.     The ASCVD Risk score (Arnett DK, et al., 2019) failed to calculate for the following reasons:   The 2019 ASCVD risk score is only valid for ages 38 to 64    Assessment & Plan:   Problem List Items Addressed This Visit       Other   Immunization due    Patient is agreeable to getting guardasil series. Will see her back in office for her pap smear/ well woman exam.      Relevant Orders   HPV 9-valent vaccine,Recombinat (Completed)    Return in about 1  month (around 03/20/2022) for well woman exam - at the patient's convenience.    Farrel Conners, MD

## 2022-02-21 ENCOUNTER — Other Ambulatory Visit (HOSPITAL_COMMUNITY): Payer: Self-pay

## 2022-04-04 ENCOUNTER — Other Ambulatory Visit (HOSPITAL_COMMUNITY): Payer: Self-pay

## 2022-04-04 MED ORDER — LISDEXAMFETAMINE DIMESYLATE 50 MG PO CHEW
1.0000 | CHEWABLE_TABLET | Freq: Every day | ORAL | 0 refills | Status: DC
  Filled 2022-04-04: qty 30, 30d supply, fill #0

## 2022-04-05 ENCOUNTER — Other Ambulatory Visit (HOSPITAL_COMMUNITY): Payer: Self-pay

## 2022-04-05 DIAGNOSIS — K51011 Ulcerative (chronic) pancolitis with rectal bleeding: Secondary | ICD-10-CM | POA: Diagnosis not present

## 2022-04-19 ENCOUNTER — Encounter: Payer: 59 | Admitting: Family Medicine

## 2022-04-27 ENCOUNTER — Ambulatory Visit (INDEPENDENT_AMBULATORY_CARE_PROVIDER_SITE_OTHER): Payer: 59 | Admitting: Family Medicine

## 2022-04-27 ENCOUNTER — Other Ambulatory Visit (HOSPITAL_COMMUNITY)
Admission: RE | Admit: 2022-04-27 | Discharge: 2022-04-27 | Disposition: A | Discharge status: Home / Self Care | Payer: 59 | Source: Ambulatory Visit | Attending: Family Medicine | Admitting: Family Medicine

## 2022-04-27 ENCOUNTER — Encounter: Payer: Self-pay | Admitting: Family Medicine

## 2022-04-27 VITALS — BP 102/80 | HR 80 | Temp 98.2°F | Ht 64.0 in | Wt 146.9 lb

## 2022-04-27 DIAGNOSIS — Z0000 Encounter for general adult medical examination without abnormal findings: Secondary | ICD-10-CM | POA: Diagnosis not present

## 2022-04-27 DIAGNOSIS — Z01419 Encounter for gynecological examination (general) (routine) without abnormal findings: Secondary | ICD-10-CM | POA: Insufficient documentation

## 2022-04-27 NOTE — Progress Notes (Signed)
Complete physical exam  Patient: Monica Ortega   DOB: Jul 17, 1990   31 y.o. Female  MRN: [id number]  Subjective:    Chief Complaint  Patient presents with   Annual Exam    Monica Ortega is a 31 y.o. female who presents today for a complete physical exam. She reports consuming a general diet. Gym/ health club routine includes cardio and light weights. She generally feels well. She reports sleeping well. She does not have additional problems to discuss today.   No current dental issues at this time.  LMP 04/10/22. Pt reports no problems, periods are regular, no unusual vaginal discharge. Patient is not sexually active. Pt is not curently on and hormonal OCP's or birth control.   Most recent fall risk assessment:    11/17/2021    8:47 AM  Fall Risk   Falls in the past year? 0  Number falls in past yr: 0  Injury with Fall? 0  Risk for fall due to : No Fall Risks  Follow up Falls evaluation completed     Most recent depression screenings:    04/27/2022    8:13 AM 02/17/2022    1:46 PM  PHQ 2/9 Scores  PHQ - 2 Score 0 2  PHQ- 9 Score 5 10        Patient Care Team: Farrel Conners, MD as PCP - General (Family Medicine) Irene Shipper, MD as Consulting Physician (Gastroenterology) Waynetta Sandy, MD as Consulting Physician (Vascular Surgery) Aloha Gell, MD as Consulting Physician (Obstetrics and Gynecology)   Outpatient Medications Prior to Visit  Medication Sig   acetaminophen (TYLENOL) 500 MG tablet Take 1,000 mg by mouth every 6 (six) hours as needed for headache (pain).    cetirizine (ZYRTEC) 10 MG tablet Take 10 mg by mouth daily.   COVID-19 mRNA bivalent vaccine, Pfizer, (PFIZER COVID-19 VAC BIVALENT) injection Inject into the muscle.   cyanocobalamin 1000 MCG tablet Take by mouth.   Lisdexamfetamine Dimesylate (VYVANSE) 60 MG CHEW Chew 1 tablet by mouth once a day   loperamide (IMODIUM) 2 MG capsule Take 2-4 mg by mouth 2 (two) times  daily as needed for diarrhea or loose stools.    Multiple Vitamin (MULTIVITAMIN) tablet Take 1 tablet by mouth daily.   Omega-3 Fatty Acids (FISH OIL PO) Take by mouth daily.   Simethicone (GAS-X PO) Take 1 capsule by mouth daily as needed (flatulence).   [DISCONTINUED] Lisdexamfetamine Dimesylate (VYVANSE) 50 MG CHEW Chew 1 tablet by mouth daily.   [DISCONTINUED] Psyllium (METAMUCIL PO) Take by mouth in the morning and at bedtime.   Facility-Administered Medications Prior to Visit  Medication Dose Route Frequency Provider   0.9 %  sodium chloride infusion  500 mL Intravenous Once Irene Shipper, MD    Review of Systems  HENT:  Negative for hearing loss.   Eyes:  Negative for blurred vision.  Respiratory:  Negative for shortness of breath.   Cardiovascular:  Negative for chest pain.  Gastrointestinal: Negative.   Genitourinary: Negative.   Musculoskeletal:  Negative for back pain.  Neurological:  Negative for headaches.  Psychiatric/Behavioral:  Negative for depression.           Objective:     BP 102/80 (BP Location: Left Arm, Patient Position: Sitting, Cuff Size: Normal)   Pulse 80   Temp 98.2 F (36.8 C) (Oral)   Ht 5' 4"  (1.626 m)   Wt 146 lb 14.4 oz (66.6 kg)   LMP 04/10/2022 (Exact  Date)   SpO2 100%   BMI 25.22 kg/m    Physical Exam Vitals reviewed. Exam conducted with a chaperone present.  Constitutional:      Appearance: She is normal weight.  Pulmonary:     Effort: Pulmonary effort is normal.  Chest:     Chest wall: No mass.  Breasts:    Tanner Score is 5.     Right: Normal. No mass or tenderness.     Left: Normal. No mass or tenderness.  Abdominal:     General: Abdomen is flat. Bowel sounds are normal.     Palpations: Abdomen is soft.  Genitourinary:    General: Normal vulva.     Exam position: Lithotomy position.     Tanner stage (genital): 5.     Vagina: Normal.     Cervix: Normal.     Uterus: Normal.      Adnexa: Right adnexa normal and  left adnexa normal.     Rectum: Normal.  Lymphadenopathy:     Upper Body:     Right upper body: No axillary adenopathy.     Left upper body: No axillary adenopathy.  Neurological:     General: No focal deficit present.     Mental Status: She is alert and oriented to person, place, and time.  Psychiatric:        Mood and Affect: Mood and affect normal.      No results found for any visits on 04/27/22.     Assessment & Plan:    Routine Health Maintenance and Physical Exam  Immunization History  Administered Date(s) Administered   HPV 9-valent 02/17/2022   Influenza,inj,Quad PF,6+ Mos 04/17/2015, 05/31/2017, 04/12/2018, 05/01/2019, 04/14/2021   Influenza-Unspecified 04/12/2018, 04/13/2022   PFIZER(Purple Top)SARS-COV-2 Vaccination 09/10/2019, 10/11/2019, 04/18/2020   PPD Test 05/03/2021   Pfizer Covid-19 Vaccine Bivalent Booster 26yr & up 09/16/2021   Tdap 10/20/2014    Health Maintenance  Topic Date Due   PAP SMEAR-Modifier  07/11/2018   HPV VACCINES (2 - 3-dose SCDM series) 03/17/2022   COVID-19 Vaccine (5 - Pfizer risk series) 05/13/2022 (Originally 11/11/2021)   TETANUS/TDAP  10/19/2024   INFLUENZA VACCINE  Completed   Hepatitis C Screening  Completed   HIV Screening  Completed    Discussed health benefits of physical activity, and encouraged her to engage in regular exercise appropriate for her age and condition.  Problem List Items Addressed This Visit       Other   Healthy adult on routine physical examination    Normal physical exam findings and well woman exam. Follow up next year for Annual Physical.      Other Visit Diagnoses     Encounter for gynecological examination without abnormal finding    -  Primary   Relevant Orders   Cytology - PAP      Return in about 1 year (around 04/28/2023) for Annual Physical Exam.     BFarrel Conners MD

## 2022-04-27 NOTE — Assessment & Plan Note (Signed)
Normal physical exam findings and well woman exam. Follow up next year for Annual Physical.

## 2022-04-29 LAB — CYTOLOGY - PAP
Comment: NEGATIVE
Diagnosis: UNDETERMINED — AB
High risk HPV: NEGATIVE

## 2022-05-02 NOTE — Progress Notes (Signed)
ASCUS on pap but her HPV is negative, risk of CA is very low, recheck pap in 3 years.

## 2022-05-05 ENCOUNTER — Other Ambulatory Visit (HOSPITAL_COMMUNITY): Payer: Self-pay

## 2022-05-05 DIAGNOSIS — Z79899 Other long term (current) drug therapy: Secondary | ICD-10-CM | POA: Diagnosis not present

## 2022-05-05 DIAGNOSIS — F902 Attention-deficit hyperactivity disorder, combined type: Secondary | ICD-10-CM | POA: Diagnosis not present

## 2022-05-05 MED ORDER — LISDEXAMFETAMINE DIMESYLATE 60 MG PO CHEW
1.0000 | CHEWABLE_TABLET | Freq: Every day | ORAL | 0 refills | Status: DC
  Filled 2022-05-05: qty 30, 30d supply, fill #0

## 2022-06-14 ENCOUNTER — Other Ambulatory Visit (HOSPITAL_COMMUNITY): Payer: Self-pay

## 2022-06-14 MED ORDER — LISDEXAMFETAMINE DIMESYLATE 60 MG PO CHEW
60.0000 mg | CHEWABLE_TABLET | Freq: Every day | ORAL | 0 refills | Status: DC
  Filled 2022-06-14: qty 30, 30d supply, fill #0

## 2022-07-22 DIAGNOSIS — H04123 Dry eye syndrome of bilateral lacrimal glands: Secondary | ICD-10-CM | POA: Diagnosis not present

## 2022-07-25 ENCOUNTER — Other Ambulatory Visit (HOSPITAL_COMMUNITY): Payer: Self-pay

## 2022-07-25 MED ORDER — LISDEXAMFETAMINE DIMESYLATE 60 MG PO CHEW
1.0000 | CHEWABLE_TABLET | Freq: Every day | ORAL | 0 refills | Status: DC
  Filled 2022-07-25: qty 30, 30d supply, fill #0

## 2022-08-03 ENCOUNTER — Other Ambulatory Visit (HOSPITAL_COMMUNITY): Payer: Self-pay

## 2022-08-04 ENCOUNTER — Other Ambulatory Visit (HOSPITAL_BASED_OUTPATIENT_CLINIC_OR_DEPARTMENT_OTHER): Payer: Self-pay

## 2022-08-04 DIAGNOSIS — F902 Attention-deficit hyperactivity disorder, combined type: Secondary | ICD-10-CM | POA: Diagnosis not present

## 2022-08-04 DIAGNOSIS — Z79899 Other long term (current) drug therapy: Secondary | ICD-10-CM | POA: Diagnosis not present

## 2022-08-04 MED ORDER — LISDEXAMFETAMINE DIMESYLATE 60 MG PO CHEW
1.0000 | CHEWABLE_TABLET | Freq: Every day | ORAL | 0 refills | Status: DC
  Filled 2022-08-04 – 2022-08-05 (×4): qty 30, 30d supply, fill #0

## 2022-08-05 ENCOUNTER — Other Ambulatory Visit: Payer: Self-pay

## 2022-08-05 ENCOUNTER — Other Ambulatory Visit (HOSPITAL_BASED_OUTPATIENT_CLINIC_OR_DEPARTMENT_OTHER): Payer: Self-pay

## 2022-08-06 ENCOUNTER — Other Ambulatory Visit (HOSPITAL_BASED_OUTPATIENT_CLINIC_OR_DEPARTMENT_OTHER): Payer: Self-pay

## 2022-08-08 ENCOUNTER — Other Ambulatory Visit: Payer: Self-pay

## 2022-08-08 ENCOUNTER — Other Ambulatory Visit (HOSPITAL_BASED_OUTPATIENT_CLINIC_OR_DEPARTMENT_OTHER): Payer: Self-pay

## 2022-08-09 ENCOUNTER — Other Ambulatory Visit: Payer: Self-pay

## 2022-08-10 ENCOUNTER — Other Ambulatory Visit: Payer: Self-pay

## 2022-08-11 ENCOUNTER — Other Ambulatory Visit: Payer: Self-pay

## 2022-08-15 ENCOUNTER — Other Ambulatory Visit: Payer: Self-pay

## 2022-08-19 ENCOUNTER — Encounter: Payer: Self-pay | Admitting: Family Medicine

## 2022-08-22 ENCOUNTER — Ambulatory Visit: Payer: 59 | Admitting: Family Medicine

## 2022-08-22 ENCOUNTER — Encounter: Payer: Self-pay | Admitting: Family Medicine

## 2022-08-22 VITALS — BP 118/90 | HR 100 | Temp 98.3°F | Ht 64.0 in | Wt 143.7 lb

## 2022-08-22 DIAGNOSIS — Z3200 Encounter for pregnancy test, result unknown: Secondary | ICD-10-CM

## 2022-08-22 DIAGNOSIS — Z30011 Encounter for initial prescription of contraceptive pills: Secondary | ICD-10-CM

## 2022-08-22 LAB — POCT URINE PREGNANCY: Preg Test, Ur: NEGATIVE

## 2022-08-22 MED ORDER — LEVONORGESTREL-ETHINYL ESTRAD 0.1-20 MG-MCG PO TABS: 1.0000 | ORAL_TABLET | Freq: Every day | ORAL | 11 refills | Status: DC

## 2022-08-22 NOTE — Progress Notes (Signed)
Established Patient Office Visit  Subjective   Patient ID: Monica Ortega, female    DOB: October 09, 1990  Age: 32 y.o. MRN: [id number]  Chief Complaint  Patient presents with   Contraception    Patient requests to begin OCP, has unprotected sex 4 days ago and used Plan B 3 days ago    Patient is here to discuss starting OCP's, her pregnancy test is negative today in office. Pt reports that she did have unprotected intercourse about 1 1/2 weeks ago and took plan b after the encounter. We discussed all methods of birth control including OCP's, IUD, nexplanon implants and discussed the risks/benefits associated with each. I reviewed her medication history and she was taking Alesse and she reports she did well with this medication.    Current Outpatient Medications  Medication Instructions   acetaminophen (TYLENOL) 1,000 mg, Oral, Every 6 hours PRN   cetirizine (ZYRTEC) 10 mg, Oral, Daily   COVID-19 mRNA bivalent vaccine, Pfizer, (PFIZER COVID-19 VAC BIVALENT) injection Intramuscular   cyanocobalamin 1000 MCG tablet Oral   levonorgestrel-ethinyl estradiol (ALESSE) 0.1-20 MG-MCG tablet 1 tablet, Oral, Daily   Lisdexamfetamine Dimesylate (VYVANSE) 60 MG CHEW Chew 1 tablet by mouth once a day   Lisdexamfetamine Dimesylate (VYVANSE) 60 MG CHEW 1 tablet, Oral, Daily   Lisdexamfetamine Dimesylate (VYVANSE) 60 MG CHEW Chew 1 tablet by mouth daily.   Lisdexamfetamine Dimesylate (VYVANSE) 60 MG CHEW Take 1 tablet by mouth daily   Lisdexamfetamine Dimesylate (VYVANSE) 60 mg, Oral, Daily   loperamide (IMODIUM) 2-4 mg, Oral, 2 times daily PRN   Multiple Vitamin (MULTIVITAMIN) tablet 1 tablet, Oral, Daily   Omega-3 Fatty Acids (FISH OIL PO) Oral, Daily   Simethicone (GAS-X PO) 1 capsule, Oral, Daily PRN      Review of Systems  All other systems reviewed and are negative.     Objective:     BP (!) 118/90 (BP Location: Left Arm, Patient Position: Sitting, Cuff Size: Normal)   Pulse 100    Temp 98.3 F (36.8 C) (Oral)   Ht 5' 4"$  (1.626 m)   Wt 143 lb 11.2 oz (65.2 kg)   LMP 07/30/2022 (Exact Date)   SpO2 97%   BMI 24.67 kg/m    Physical Exam Vitals reviewed.  Constitutional:      Appearance: Normal appearance. She is normal weight.  Eyes:     Conjunctiva/sclera: Conjunctivae normal.  Pulmonary:     Effort: Pulmonary effort is normal.  Musculoskeletal:        General: Normal range of motion.  Neurological:     General: No focal deficit present.     Mental Status: She is alert and oriented to person, place, and time.  Psychiatric:        Mood and Affect: Mood normal.        Behavior: Behavior normal.      Results for orders placed or performed in visit on 08/22/22  POCT urine pregnancy  Result Value Ref Range   Preg Test, Ur Negative Negative      The ASCVD Risk score (Arnett DK, et al., 2019) failed to calculate for the following reasons:   The 2019 ASCVD risk score is only valid for ages 89 to 100    Assessment & Plan:   Problem List Items Addressed This Visit   None Visit Diagnoses     Encounter for initial prescription of contraceptive pills    -  Primary   Relevant Medications   Pt requesting OCP's, will  rx the medication and she will continue to follow up yearly.   levonorgestrel-ethinyl estradiol (ALESSE) 0.1-20 MG-MCG tablet   Pregnancy examination or test, pregnancy unconfirmed       Relevant Orders   POCT urine pregnancy (Completed)       No follow-ups on file.    Farrel Conners, MD

## 2022-08-22 NOTE — Patient Instructions (Signed)
OK to start the birth control on the Sunday after you start your period.

## 2022-09-01 ENCOUNTER — Ambulatory Visit: Payer: 59 | Admitting: Family Medicine

## 2022-09-02 ENCOUNTER — Encounter: Payer: Self-pay | Admitting: Family Medicine

## 2022-10-25 ENCOUNTER — Other Ambulatory Visit (HOSPITAL_BASED_OUTPATIENT_CLINIC_OR_DEPARTMENT_OTHER): Payer: Self-pay

## 2022-10-25 ENCOUNTER — Other Ambulatory Visit: Payer: Self-pay

## 2022-10-25 MED ORDER — LISDEXAMFETAMINE DIMESYLATE 60 MG PO CHEW
1.0000 | CHEWABLE_TABLET | Freq: Every day | ORAL | 0 refills | Status: DC
  Filled 2022-10-25: qty 30, 30d supply, fill #0

## 2022-10-28 DIAGNOSIS — H5213 Myopia, bilateral: Secondary | ICD-10-CM | POA: Diagnosis not present

## 2022-11-01 ENCOUNTER — Other Ambulatory Visit: Payer: Self-pay

## 2022-11-03 ENCOUNTER — Other Ambulatory Visit: Payer: Self-pay

## 2022-11-03 ENCOUNTER — Other Ambulatory Visit (HOSPITAL_BASED_OUTPATIENT_CLINIC_OR_DEPARTMENT_OTHER): Payer: Self-pay

## 2022-11-03 DIAGNOSIS — Z5181 Encounter for therapeutic drug level monitoring: Secondary | ICD-10-CM | POA: Diagnosis not present

## 2022-11-03 DIAGNOSIS — Z79891 Long term (current) use of opiate analgesic: Secondary | ICD-10-CM | POA: Diagnosis not present

## 2022-11-03 DIAGNOSIS — F902 Attention-deficit hyperactivity disorder, combined type: Secondary | ICD-10-CM | POA: Diagnosis not present

## 2022-11-03 DIAGNOSIS — Z79899 Other long term (current) drug therapy: Secondary | ICD-10-CM | POA: Diagnosis not present

## 2022-11-03 MED ORDER — AMPHETAMINE-DEXTROAMPHET ER 30 MG PO CP24
30.0000 mg | ORAL_CAPSULE | Freq: Two times a day (BID) | ORAL | 0 refills | Status: DC
  Filled 2022-11-03: qty 60, 30d supply, fill #0

## 2022-12-19 DIAGNOSIS — J019 Acute sinusitis, unspecified: Secondary | ICD-10-CM | POA: Diagnosis not present

## 2022-12-19 DIAGNOSIS — H65191 Other acute nonsuppurative otitis media, right ear: Secondary | ICD-10-CM | POA: Diagnosis not present

## 2022-12-21 ENCOUNTER — Ambulatory Visit: Payer: 59 | Admitting: Family Medicine

## 2022-12-22 ENCOUNTER — Ambulatory Visit: Payer: 59 | Admitting: Family Medicine

## 2022-12-22 ENCOUNTER — Other Ambulatory Visit (HOSPITAL_COMMUNITY)
Admission: RE | Admit: 2022-12-22 | Discharge: 2022-12-22 | Disposition: A | Discharge status: Home / Self Care | Payer: 59 | Source: Ambulatory Visit | Attending: Family Medicine | Admitting: Family Medicine

## 2022-12-22 ENCOUNTER — Encounter: Payer: Self-pay | Admitting: Family Medicine

## 2022-12-22 VITALS — BP 118/84 | HR 88 | Temp 98.5°F | Ht 64.0 in | Wt 136.3 lb

## 2022-12-22 DIAGNOSIS — F902 Attention-deficit hyperactivity disorder, combined type: Secondary | ICD-10-CM | POA: Insufficient documentation

## 2022-12-22 DIAGNOSIS — Z1159 Encounter for screening for other viral diseases: Secondary | ICD-10-CM

## 2022-12-22 DIAGNOSIS — Z114 Encounter for screening for human immunodeficiency virus [HIV]: Secondary | ICD-10-CM | POA: Diagnosis not present

## 2022-12-22 DIAGNOSIS — Z209 Contact with and (suspected) exposure to unspecified communicable disease: Secondary | ICD-10-CM | POA: Insufficient documentation

## 2022-12-22 DIAGNOSIS — B3731 Acute candidiasis of vulva and vagina: Secondary | ICD-10-CM

## 2022-12-22 MED ORDER — AMPHETAMINE-DEXTROAMPHET ER 30 MG PO CP24: 30.0000 mg | ORAL_CAPSULE | Freq: Two times a day (BID) | ORAL | 0 refills | Status: DC

## 2022-12-22 NOTE — Assessment & Plan Note (Signed)
Pt is doing well on the adderall 30 mg capsules daily, she will continue this prescription-- she may take a second capsule in the afternoon if needed. RTC in 4 months for annual physical

## 2022-12-22 NOTE — Progress Notes (Signed)
Established Patient Office Visit  Subjective   Patient ID: Monica Ortega, female    DOB: August 30, 1990  Age: 32 y.o. MRN: [id number]  Chief Complaint  Patient presents with   Patient requests STD testing and denies exposure    Patient is here for medication refills and follow up .   ADHD-- pt reports she has been taking her adderall 30 mg once a day, states that she is doing well with this dose, focus and attention are good, no side effects to the medication reported.   OCP-- is taking her birth control pills, states she is doing well with these, no side effects reported, periods are regular, no issues. Pt is requesting STI testing today, states that she has had a couple new partners recently, no symptoms that she is reporting today.     Current Outpatient Medications  Medication Instructions   acetaminophen (TYLENOL) 1,000 mg, Oral, Every 6 hours PRN   amphetamine-dextroamphetamine (ADDERALL XR) 30 MG 24 hr capsule 30 mg, Oral, 2 times daily   cetirizine (ZYRTEC) 10 mg, Oral, Daily   COVID-19 mRNA bivalent vaccine, Pfizer, (PFIZER COVID-19 VAC BIVALENT) injection Intramuscular   cyanocobalamin 1000 MCG tablet Oral   levonorgestrel-ethinyl estradiol (ALESSE) 0.1-20 MG-MCG tablet 1 tablet, Oral, Daily   loperamide (IMODIUM) 2-4 mg, Oral, 2 times daily PRN   Multiple Vitamin (MULTIVITAMIN) tablet 1 tablet, Oral, Daily   Omega-3 Fatty Acids (FISH OIL PO) Oral, Daily   Simethicone (GAS-X PO) 1 capsule, Oral, Daily PRN    Patient Active Problem List   Diagnosis Date Noted   Attention deficit hyperactivity disorder (ADHD), combined type 12/22/2022   Healthy adult on routine physical examination 04/27/2022   Immunization due 02/17/2022   Ulcerative colitis with complication ([facility]) 12/27/2018   DVT (deep venous thrombosis) ([facility]) 10/31/2018   Rectal bleed 10/31/2018   Universal ulcerative colitis with rectal bleeding ([facility]) 10/31/2018   Hypokalemia 10/31/2018   Hyponatremia  10/31/2018   Abdominal pain 09/30/2018   MDD (major depressive disorder), recurrent severe, without psychosis ([facility]) 04/16/2015      Review of Systems  All other systems reviewed and are negative.     Objective:     BP 118/84 (BP Location: Left Arm, Patient Position: Sitting, Cuff Size: Normal)   Pulse 88   Temp 98.5 F (36.9 C) (Oral)   Ht 5\' 4"  (1.626 m)   Wt 136 lb 4.8 oz (61.8 kg)   LMP 12/10/2022 (Exact Date)   SpO2 97%   BMI 23.40 kg/m    Physical Exam Vitals reviewed.  Constitutional:      Appearance: Normal appearance. She is normal weight.  Eyes:     Conjunctiva/sclera: Conjunctivae normal.  Pulmonary:     Effort: Pulmonary effort is normal.  Skin:    General: Skin is warm and dry.  Neurological:     General: No focal deficit present.     Mental Status: She is alert and oriented to person, place, and time. Mental status is at baseline.  Psychiatric:        Mood and Affect: Mood normal.        Behavior: Behavior normal.        Thought Content: Thought content normal.        Judgment: Judgment normal.      No results found for any visits on 12/22/22.    The ASCVD Risk score (Arnett DK, et al., 2019) failed to calculate for the following reasons:   The 2019 ASCVD risk score  is only valid for ages 61 to 36    Assessment & Plan:  Attention deficit hyperactivity disorder (ADHD), combined type Assessment & Plan: Pt is doing well on the adderall 30 mg capsules daily, she will continue this prescription-- she may take a second capsule in the afternoon if needed. RTC in 4 months for annual physical  Orders: -     Amphetamine-Dextroamphet ER; Take 1 capsule (30 mg total) by mouth 2 (two) times daily.  Dispense: 60 capsule; Refill: 0  Exposure to communicable disease -     Cervicovaginal ancillary only -- Pt reports having new sexual partners recently, states she would like STI testing today, she denies any symptoms, no unusual vaginal discharge, no pelvic  pain, no fever/chills, -     RPR  Encounter for screening for HIV -     HIV Antibody (routine testing w rflx)  Need for hepatitis C screening test -     Hepatitis C antibody     Return in about 4 months (around 05/01/2023) for annual physical exam.    Karie Georges, MD

## 2022-12-23 LAB — HEPATITIS C ANTIBODY: Hepatitis C Ab: NONREACTIVE

## 2022-12-23 LAB — RPR: RPR Ser Ql: NONREACTIVE

## 2022-12-23 LAB — HIV ANTIBODY (ROUTINE TESTING W REFLEX): HIV 1&2 Ab, 4th Generation: NONREACTIVE

## 2022-12-26 ENCOUNTER — Other Ambulatory Visit (HOSPITAL_BASED_OUTPATIENT_CLINIC_OR_DEPARTMENT_OTHER): Payer: Self-pay

## 2022-12-26 LAB — CERVICOVAGINAL ANCILLARY ONLY
Bacterial Vaginitis (gardnerella): NEGATIVE
Candida Glabrata: NEGATIVE
Candida Vaginitis: POSITIVE — AB
Chlamydia: NEGATIVE
Comment: NEGATIVE
Comment: NEGATIVE
Comment: NEGATIVE
Comment: NEGATIVE
Comment: NEGATIVE
Comment: NORMAL
Neisseria Gonorrhea: NEGATIVE
Trichomonas: NEGATIVE

## 2022-12-26 MED ORDER — AMPHETAMINE-DEXTROAMPHET ER 30 MG PO CP24
30.0000 mg | ORAL_CAPSULE | Freq: Two times a day (BID) | ORAL | 0 refills | Status: DC
  Filled 2022-12-26 – 2022-12-27 (×2): qty 60, 30d supply, fill #0

## 2022-12-27 ENCOUNTER — Other Ambulatory Visit (HOSPITAL_BASED_OUTPATIENT_CLINIC_OR_DEPARTMENT_OTHER): Payer: Self-pay

## 2022-12-27 MED ORDER — FLUCONAZOLE 150 MG PO TABS
150.0000 mg | ORAL_TABLET | Freq: Once | ORAL | 0 refills | Status: AC
Start: 2022-12-27 — End: 2022-12-27

## 2022-12-27 NOTE — Addendum Note (Signed)
Addended by: Karie Georges on: 12/27/2022 12:52 PM   Modules accepted: Orders

## 2023-02-02 ENCOUNTER — Other Ambulatory Visit (HOSPITAL_BASED_OUTPATIENT_CLINIC_OR_DEPARTMENT_OTHER): Payer: Self-pay

## 2023-02-02 DIAGNOSIS — F902 Attention-deficit hyperactivity disorder, combined type: Secondary | ICD-10-CM | POA: Diagnosis not present

## 2023-02-02 DIAGNOSIS — Z79899 Other long term (current) drug therapy: Secondary | ICD-10-CM | POA: Diagnosis not present

## 2023-02-02 MED ORDER — AMPHETAMINE-DEXTROAMPHET ER 30 MG PO CP24
30.0000 mg | ORAL_CAPSULE | Freq: Two times a day (BID) | ORAL | 0 refills | Status: DC
  Filled 2023-02-02: qty 60, 30d supply, fill #0

## 2023-02-06 DIAGNOSIS — F902 Attention-deficit hyperactivity disorder, combined type: Secondary | ICD-10-CM | POA: Diagnosis not present

## 2023-02-21 DIAGNOSIS — Z932 Ileostomy status: Secondary | ICD-10-CM | POA: Diagnosis not present

## 2023-02-21 DIAGNOSIS — K51011 Ulcerative (chronic) pancolitis with rectal bleeding: Secondary | ICD-10-CM | POA: Diagnosis not present

## 2023-05-01 ENCOUNTER — Encounter: Payer: Self-pay | Admitting: Family Medicine

## 2023-05-01 ENCOUNTER — Ambulatory Visit (INDEPENDENT_AMBULATORY_CARE_PROVIDER_SITE_OTHER): Payer: 59 | Admitting: Family Medicine

## 2023-05-01 VITALS — BP 100/68 | HR 77 | Temp 97.8°F | Ht 64.25 in | Wt 139.2 lb

## 2023-05-01 DIAGNOSIS — Z0000 Encounter for general adult medical examination without abnormal findings: Secondary | ICD-10-CM

## 2023-05-01 DIAGNOSIS — Z30011 Encounter for initial prescription of contraceptive pills: Secondary | ICD-10-CM

## 2023-05-01 DIAGNOSIS — F902 Attention-deficit hyperactivity disorder, combined type: Secondary | ICD-10-CM | POA: Diagnosis not present

## 2023-05-01 DIAGNOSIS — Z13220 Encounter for screening for lipoid disorders: Secondary | ICD-10-CM

## 2023-05-01 DIAGNOSIS — Z23 Encounter for immunization: Secondary | ICD-10-CM | POA: Diagnosis not present

## 2023-05-01 MED ORDER — AMPHETAMINE-DEXTROAMPHET ER 30 MG PO CP24
30.0000 mg | ORAL_CAPSULE | Freq: Two times a day (BID) | ORAL | 0 refills | Status: DC
Start: 2023-05-29 — End: 2023-11-28

## 2023-05-01 MED ORDER — AMPHETAMINE-DEXTROAMPHET ER 30 MG PO CP24
30.0000 mg | ORAL_CAPSULE | Freq: Two times a day (BID) | ORAL | 0 refills | Status: DC
Start: 2023-05-01 — End: 2023-11-28

## 2023-05-01 MED ORDER — LEVONORGESTREL-ETHINYL ESTRAD 0.1-20 MG-MCG PO TABS
1.0000 | ORAL_TABLET | Freq: Every day | ORAL | 3 refills | Status: DC
Start: 2023-05-01 — End: 2023-10-11

## 2023-05-01 NOTE — Progress Notes (Signed)
Complete physical exam  Patient: Monica Ortega   DOB: [birth year]   32 y.o. Female  MRN: [id number]  Subjective:    Chief Complaint  Patient presents with   Annual Exam    Monica Ortega is a 32 y.o. female who presents today for a complete physical exam. She reports consuming a general diet. Gym/ health club routine includes light weights and biking three times a week. She generally feels well. She reports sleeping well. She does not have additional problems to discuss today.    Most recent fall risk assessment:    11/17/2021    8:47 AM  Fall Risk   Falls in the past year? 0  Number falls in past yr: 0  Injury with Fall? 0  Risk for fall due to : No Fall Risks  Follow up Falls evaluation completed     Most recent depression screenings:    05/01/2023    8:23 AM 08/22/2022    9:00 AM  PHQ 2/9 Scores  PHQ - 2 Score 0 2  PHQ- 9 Score 0 7    Vision:Within last year and Dental: No current dental problems and Receives regular dental care  Patient Active Problem List   Diagnosis Date Noted   Attention deficit hyperactivity disorder (ADHD), combined type 12/22/2022   Healthy adult on routine physical examination 04/27/2022   Immunization due 02/17/2022   Ulcerative colitis with complication (HCC) 12/27/2018   DVT (deep venous thrombosis) (HCC) 10/31/2018   Rectal bleed 10/31/2018   Universal ulcerative colitis with rectal bleeding (HCC) 10/31/2018   Hypokalemia 10/31/2018   Hyponatremia 10/31/2018   Abdominal pain 09/30/2018   MDD (major depressive disorder), recurrent severe, without psychosis (HCC) 04/16/2015      Patient Care Team: Karie Georges, MD as PCP - General (Family Medicine) Hilarie Fredrickson, MD as Consulting Physician (Gastroenterology) Maeola Harman, MD as Consulting Physician (Vascular Surgery) Noland Fordyce, MD as Consulting Physician (Obstetrics and Gynecology)   Outpatient Medications Prior to Visit  Medication Sig    acetaminophen (TYLENOL) 500 MG tablet Take 1,000 mg by mouth every 6 (six) hours as needed for headache (pain).    amphetamine-dextroamphetamine (ADDERALL XR) 30 MG 24 hr capsule Take 1 capsule (30 mg total) by mouth 2 (two) times daily.   cetirizine (ZYRTEC) 10 MG tablet Take 10 mg by mouth daily.   COVID-19 mRNA bivalent vaccine, Pfizer, (PFIZER COVID-19 VAC BIVALENT) injection Inject into the muscle.   cyanocobalamin 1000 MCG tablet Take by mouth.   loperamide (IMODIUM) 2 MG capsule Take 2-4 mg by mouth 2 (two) times daily as needed for diarrhea or loose stools.    Multiple Vitamin (MULTIVITAMIN) tablet Take 1 tablet by mouth daily.   Omega-3 Fatty Acids (FISH OIL PO) Take by mouth daily.   Simethicone (GAS-X PO) Take 1 capsule by mouth daily as needed (flatulence).   [DISCONTINUED] amphetamine-dextroamphetamine (ADDERALL XR) 30 MG 24 hr capsule Take 1 capsule (30 mg total) by mouth 2 (two) times daily.   [DISCONTINUED] amphetamine-dextroamphetamine (ADDERALL XR) 30 MG 24 hr capsule Take 1 capsule (30 mg total) by mouth 2 (two) times daily.   [DISCONTINUED] levonorgestrel-ethinyl estradiol (ALESSE) 0.1-20 MG-MCG tablet Take 1 tablet by mouth daily.   [DISCONTINUED] 0.9 %  sodium chloride infusion    No facility-administered medications prior to visit.    Review of Systems  HENT:  Negative for hearing loss.   Eyes:  Negative for blurred vision.  Respiratory:  Negative for shortness of  breath.   Cardiovascular:  Negative for chest pain.  Gastrointestinal: Negative.   Genitourinary: Negative.   Musculoskeletal:  Negative for back pain.  Neurological:  Negative for headaches.  Psychiatric/Behavioral:  Negative for depression.   All other systems reviewed and are negative.      Objective:     BP 100/68 (BP Location: Left Arm, Patient Position: Sitting, Cuff Size: Normal)   Pulse 77   Temp 97.8 F (36.6 C) (Oral)   Ht 5' 4.25" (1.632 m)   Wt 139 lb 3.2 oz (63.1 kg)   LMP  04/30/2023 (Exact Date)   SpO2 99%   BMI 23.71 kg/m    Physical Exam Vitals reviewed.  Constitutional:      Appearance: Normal appearance. She is well-groomed and normal weight.  HENT:     Right Ear: Tympanic membrane and ear canal normal.     Left Ear: Tympanic membrane and ear canal normal.     Mouth/Throat:     Mouth: Mucous membranes are moist.     Pharynx: No posterior oropharyngeal erythema.  Eyes:     Conjunctiva/sclera: Conjunctivae normal.  Neck:     Thyroid: No thyromegaly.  Cardiovascular:     Rate and Rhythm: Normal rate and regular rhythm.     Pulses: Normal pulses.     Heart sounds: S1 normal and S2 normal.  Pulmonary:     Effort: Pulmonary effort is normal.     Breath sounds: Normal breath sounds and air entry.  Abdominal:     General: Abdomen is flat. Bowel sounds are normal.     Palpations: Abdomen is soft.  Musculoskeletal:     Right lower leg: No edema.     Left lower leg: No edema.  Lymphadenopathy:     Cervical: No cervical adenopathy.  Neurological:     Mental Status: She is alert and oriented to person, place, and time. Mental status is at baseline.     Gait: Gait is intact.  Psychiatric:        Mood and Affect: Mood and affect normal.        Speech: Speech normal.        Behavior: Behavior normal.        Judgment: Judgment normal.      No results found for any visits on 05/01/23.     Assessment & Plan:    Routine Health Maintenance and Physical Exam  Immunization History  Administered Date(s) Administered   HPV 9-valent 02/17/2022   Influenza,inj,Quad PF,6+ Mos 04/17/2015, 05/31/2017, 04/12/2018, 05/01/2019, 04/14/2021   Influenza-Unspecified 04/12/2018, 04/13/2022   PFIZER(Purple Top)SARS-COV-2 Vaccination 09/10/2019, 10/11/2019, 04/18/2020   PPD Test 05/03/2021   Pfizer Covid-19 Vaccine Bivalent Booster 30yrs & up 09/16/2021, 03/17/2023   Tdap 10/20/2014    Health Maintenance  Topic Date Due   HPV VACCINES (2 - 3-dose SCDM  series) 03/17/2022   INFLUENZA VACCINE  02/09/2023   COVID-19 Vaccine (6 - 2023-24 season) 05/12/2023   DTaP/Tdap/Td (2 - Td or Tdap) 10/19/2024   Cervical Cancer Screening (HPV/Pap Cotest)  04/28/2027   Hepatitis C Screening  Completed   HIV Screening  Completed    Discussed health benefits of physical activity, and encouraged her to engage in regular exercise appropriate for her age and condition.  Immunization, pneumococcus and influenza -     Flu vaccine trivalent PF, 6mos and older(Flulaval,Afluria,Fluarix,Fluzone)  Attention deficit hyperactivity disorder (ADHD), combined type -     Amphetamine-Dextroamphet ER; Take 1 capsule (30 mg total) by mouth in  the morning and at bedtime.  Dispense: 60 capsule; Refill: 0 -     Amphetamine-Dextroamphet ER; Take 1 capsule (30 mg total) by mouth 2 (two) times daily.  Dispense: 60 capsule; Refill: 0  Encounter for initial prescription of contraceptive pills -     Levonorgestrel-Ethinyl Estrad; Take 1 tablet by mouth daily.  Dispense: 84 tablet; Refill: 3  Lipid screening -     Lipid panel; Future  Routine general medical examination at a health care facility -     Comprehensive metabolic panel; Future -     CBC with Differential/Platelet; Future  Normal physical exam findings today, counseled patient on increasing the number of hours of sleep at night, counseled also on healthy sleep hygiene, RTC in 3-4 months for ADHD med refills. Labs ordered for annual surveillance.   Return in 3 months (on 08/01/2023).     Karie Georges, MD

## 2023-05-01 NOTE — Patient Instructions (Signed)

## 2023-05-02 ENCOUNTER — Other Ambulatory Visit: Payer: 59

## 2023-05-11 ENCOUNTER — Other Ambulatory Visit (INDEPENDENT_AMBULATORY_CARE_PROVIDER_SITE_OTHER): Payer: 59

## 2023-05-11 DIAGNOSIS — Z0000 Encounter for general adult medical examination without abnormal findings: Secondary | ICD-10-CM

## 2023-05-11 DIAGNOSIS — Z13220 Encounter for screening for lipoid disorders: Secondary | ICD-10-CM

## 2023-05-11 LAB — COMPREHENSIVE METABOLIC PANEL
ALT: 17 U/L (ref 0–35)
AST: 20 U/L (ref 0–37)
Albumin: 3.9 g/dL (ref 3.5–5.2)
Alkaline Phosphatase: 63 U/L (ref 39–117)
BUN: 9 mg/dL (ref 6–23)
CO2: 31 meq/L (ref 19–32)
Calcium: 9 mg/dL (ref 8.4–10.5)
Chloride: 100 meq/L (ref 96–112)
Creatinine, Ser: 0.79 mg/dL (ref 0.40–1.20)
GFR: 97.32 mL/min (ref >60.00)
Glucose, Bld: 98 mg/dL (ref 70–99)
Potassium: 4.1 meq/L (ref 3.5–5.1)
Sodium: 138 meq/L (ref 135–145)
Total Bilirubin: 0.5 mg/dL (ref 0.2–1.2)
Total Protein: 7.2 g/dL (ref 6.0–8.3)

## 2023-05-11 LAB — CBC WITH DIFFERENTIAL/PLATELET
Basophils Absolute: 0.1 10*3/uL (ref 0.0–0.1)
Basophils Relative: 0.8 % (ref 0.0–3.0)
Eosinophils Absolute: 0.1 10*3/uL (ref 0.0–0.7)
Eosinophils Relative: 1.2 % (ref 0.0–5.0)
HCT: 41.9 % (ref 36.0–46.0)
Hemoglobin: 13.5 g/dL (ref 12.0–15.0)
Lymphocytes Relative: 21.3 % (ref 12.0–46.0)
Lymphs Abs: 1.8 10*3/uL (ref 0.7–4.0)
MCHC: 32.3 g/dL (ref 30.0–36.0)
MCV: 86 fL (ref 78.0–100.0)
Monocytes Absolute: 0.6 10*3/uL (ref 0.1–1.0)
Monocytes Relative: 7.6 % (ref 3.0–12.0)
Neutro Abs: 5.8 10*3/uL (ref 1.4–7.7)
Neutrophils Relative %: 69.1 % (ref 43.0–77.0)
Platelets: 274 10*3/uL (ref 150.0–400.0)
RBC: 4.87 Mil/uL (ref 3.87–5.11)
RDW: 13.3 % (ref 11.5–15.5)
WBC: 8.4 10*3/uL (ref 4.0–10.5)

## 2023-05-11 LAB — LIPID PANEL
Cholesterol: 139 mg/dL (ref 0–200)
HDL: 56.4 mg/dL (ref >39.00)
LDL Cholesterol: 71 mg/dL (ref 0–99)
NonHDL: 82.9
Total CHOL/HDL Ratio: 2
Triglycerides: 62 mg/dL (ref 0.0–149.0)
VLDL: 12.4 mg/dL (ref 0.0–40.0)

## 2023-06-12 ENCOUNTER — Other Ambulatory Visit (HOSPITAL_BASED_OUTPATIENT_CLINIC_OR_DEPARTMENT_OTHER): Payer: Self-pay

## 2023-06-12 DIAGNOSIS — Z79899 Other long term (current) drug therapy: Secondary | ICD-10-CM | POA: Diagnosis not present

## 2023-06-12 DIAGNOSIS — F902 Attention-deficit hyperactivity disorder, combined type: Secondary | ICD-10-CM | POA: Diagnosis not present

## 2023-06-12 MED ORDER — AMPHETAMINE-DEXTROAMPHET ER 30 MG PO CP24
30.0000 mg | ORAL_CAPSULE | Freq: Two times a day (BID) | ORAL | 0 refills | Status: DC
  Filled 2023-06-12 – 2023-06-26 (×3): qty 60, 30d supply, fill #0

## 2023-06-22 ENCOUNTER — Other Ambulatory Visit (HOSPITAL_BASED_OUTPATIENT_CLINIC_OR_DEPARTMENT_OTHER): Payer: Self-pay

## 2023-06-26 ENCOUNTER — Other Ambulatory Visit (HOSPITAL_BASED_OUTPATIENT_CLINIC_OR_DEPARTMENT_OTHER): Payer: Self-pay

## 2023-10-11 ENCOUNTER — Other Ambulatory Visit: Payer: Self-pay | Admitting: Family Medicine

## 2023-10-11 DIAGNOSIS — Z30011 Encounter for initial prescription of contraceptive pills: Secondary | ICD-10-CM

## 2023-10-11 MED ORDER — LEVONORGESTREL-ETHINYL ESTRAD 0.1-20 MG-MCG PO TABS: 1.0000 | ORAL_TABLET | Freq: Every day | ORAL | 1 refills | Status: DC

## 2023-11-26 ENCOUNTER — Encounter: Payer: Self-pay | Admitting: Family Medicine

## 2023-11-28 ENCOUNTER — Other Ambulatory Visit (HOSPITAL_COMMUNITY): Payer: Self-pay

## 2023-11-28 ENCOUNTER — Encounter: Payer: Self-pay | Admitting: Family Medicine

## 2023-11-28 ENCOUNTER — Ambulatory Visit: Admitting: Family Medicine

## 2023-11-28 VITALS — BP 102/80 | HR 80 | Temp 98.5°F | Ht 64.25 in | Wt 147.9 lb

## 2023-11-28 DIAGNOSIS — R252 Cramp and spasm: Secondary | ICD-10-CM

## 2023-11-28 DIAGNOSIS — F902 Attention-deficit hyperactivity disorder, combined type: Secondary | ICD-10-CM | POA: Diagnosis not present

## 2023-11-28 MED ORDER — LISDEXAMFETAMINE DIMESYLATE 50 MG PO CAPS: 50.0000 mg | ORAL_CAPSULE | Freq: Every day | ORAL | 0 refills | Status: DC

## 2023-11-28 MED ORDER — LISDEXAMFETAMINE DIMESYLATE 50 MG PO CAPS
50.0000 mg | ORAL_CAPSULE | Freq: Every day | ORAL | 0 refills | Status: DC
  Filled 2023-11-28: qty 30, 30d supply, fill #0

## 2023-11-28 NOTE — Progress Notes (Signed)
 Established Patient Office Visit  Subjective   Patient ID: Monica Ortega, female    DOB: 07/23/1990  Age: 33 y.o. MRN: [id number]  Chief Complaint  Patient presents with   Muscle Pain    Patient complains of cramps bilateral LE, left calf greater than right x1 week, especially concerned due to history of DVT   Shortness of Breath    Intermittently for the past few months    Pt is here for follow up today. She reports she has a history of blood clots in the past -- states they were in both legs, states it was found on scans when she was sick with UC, was on eliquis  for a period of time and then it was discontinued. States she also used to have an IVC filter. Pt reports she has been having leg cramps on the right more than the left, no significant swelling that she has noticed, no redness of the skin. States that she didn't have symptoms with her previous blood clots, states that they never checked her for genetic clotting disorders. Pt was concerned that she was recently place back on OCP's about a year ago, was worried about her risk.   ADHD-- pt reports that she has only been taking her adderall sporadically on the days that she needs to work, states that she was only taking it once a day and was feeling a "let down" after the medication wore off.   Muscle Pain Associated symptoms include shortness of breath.  Shortness of Breath    Current Outpatient Medications  Medication Instructions   acetaminophen  (TYLENOL ) 1,000 mg, Every 6 hours PRN   APIXABAN  (ELIQUIS ) VTE STARTER PACK (10MG  AND 5MG ) Take as directed on package: start with two-5mg  tablets twice daily for 7 days. On day 8, switch to one-5mg  tablet twice daily.   apixaban  (ELIQUIS ) 5 mg, Oral, 2 times daily   cetirizine (ZYRTEC) 10 mg, Daily   COVID-19 mRNA bivalent vaccine, Pfizer, (PFIZER COVID-19 VAC BIVALENT) injection Intramuscular   cyanocobalamin 1000 MCG tablet Take by mouth.   levonorgestrel -ethinyl estradiol  (ALESSE) 0.1-20 MG-MCG tablet 1 tablet, Oral, Daily   lisdexamfetamine (VYVANSE ) 50 mg, Oral, Daily   [START ON 12/27/2023] lisdexamfetamine (VYVANSE ) 50 mg, Oral, Daily   [START ON 01/22/2024] lisdexamfetamine (VYVANSE ) 50 mg, Oral, Daily   loperamide  (IMODIUM ) 2-4 mg, 2 times daily PRN   Multiple Vitamin (MULTIVITAMIN) tablet 1 tablet, Daily   Omega-3 Fatty Acids (FISH OIL PO) Daily    Patient Active Problem List   Diagnosis Date Noted   Attention deficit hyperactivity disorder (ADHD), combined type 12/22/2022   Healthy adult on routine physical examination 04/27/2022   Immunization due 02/17/2022   Ulcerative colitis with complication (HCC) 12/27/2018   DVT (deep venous thrombosis) (HCC) 10/31/2018   Rectal bleed 10/31/2018   Universal ulcerative colitis with rectal bleeding (HCC) 10/31/2018   Hypokalemia 10/31/2018   Hyponatremia 10/31/2018   Abdominal pain 09/30/2018   MDD (major depressive disorder), recurrent severe, without psychosis (HCC) 04/16/2015      Review of Systems  Respiratory:  Positive for shortness of breath.   All other systems reviewed and are negative.     Objective:     BP 102/80   Pulse 80   Temp 98.5 F (36.9 C) (Oral)   Ht 5' 4.25" (1.632 m)   Wt 147 lb 14.4 oz (67.1 kg)   LMP 11/06/2023 (Exact Date)   SpO2 97%   BMI 25.19 kg/m    Physical Exam  Vitals reviewed.  Constitutional:      Appearance: Normal appearance. She is well-groomed and normal weight.  Eyes:     Conjunctiva/sclera: Conjunctivae normal.  Neck:     Thyroid : No thyromegaly.  Cardiovascular:     Rate and Rhythm: Normal rate and regular rhythm.     Pulses: Normal pulses.     Heart sounds: S1 normal and S2 normal.  Pulmonary:     Effort: Pulmonary effort is normal.     Breath sounds: Normal breath sounds and air entry.  Abdominal:     General: Bowel sounds are normal.  Musculoskeletal:     Right lower leg: No edema.     Left lower leg: No edema.  Neurological:      Mental Status: She is alert and oriented to person, place, and time. Mental status is at baseline.     Gait: Gait is intact.  Psychiatric:        Mood and Affect: Mood and affect normal.        Speech: Speech normal.        Behavior: Behavior normal.        Judgment: Judgment normal.      Results for orders placed or performed in visit on 11/28/23  D-dimer, Quantitative  Result Value Ref Range   D-Dimer, Quant 0.89 (H) <0.50 mcg/mL FEU      The ASCVD Risk score (Arnett DK, et al., 2019) failed to calculate for the following reasons:   The 2019 ASCVD risk score is only valid for ages 55 to 34    Assessment & Plan:  Leg cramp With history of DVT in the past during a UC flare up, no longer has IVC filter, states she is very active and is not having any leg swelling or pain, homan's sign is negative on exam, but she did recently get placed on OCP's which can increase her risk, will send for d dimer and possibly she will need a US  to rule out DVT.   -     D-dimer, quantitative; Future  Attention deficit hyperactivity disorder (ADHD), combined type Assessment & Plan: Pt is having issues with a "let down"  once the adderall wears off and it wasn't exactly lasting long enough into the evening. We discussed switching to vyvanse , a longer acting capsule to see if this would improve the symptoms, she is agreeable. RTC in 3 months for med refills.   Orders: -     Lisdexamfetamine Dimesylate ; Take 1 capsule (50 mg total) by mouth daily.  Dispense: 30 capsule; Refill: 0 -     Lisdexamfetamine Dimesylate ; Take 1 capsule (50 mg total) by mouth daily.  Dispense: 30 capsule; Refill: 0 -     Lisdexamfetamine Dimesylate ; Take 1 capsule (50 mg total) by mouth daily.  Dispense: 30 capsule; Refill: 0     Return in about 3 months (around 02/28/2024) for video visit for ADHD med refills.    Aida House, MD

## 2023-11-29 ENCOUNTER — Emergency Department (HOSPITAL_COMMUNITY)
Admission: EM | Admit: 2023-11-29 | Discharge: 2023-11-29 | Disposition: A | Discharge status: Home / Self Care | Attending: Emergency Medicine | Admitting: Emergency Medicine

## 2023-11-29 ENCOUNTER — Other Ambulatory Visit (HOSPITAL_COMMUNITY): Payer: Self-pay

## 2023-11-29 ENCOUNTER — Emergency Department (EMERGENCY_DEPARTMENT_HOSPITAL)

## 2023-11-29 ENCOUNTER — Ambulatory Visit: Payer: Self-pay | Admitting: Family Medicine

## 2023-11-29 ENCOUNTER — Encounter (HOSPITAL_COMMUNITY): Payer: Self-pay

## 2023-11-29 ENCOUNTER — Telehealth: Payer: Self-pay | Admitting: *Deleted

## 2023-11-29 DIAGNOSIS — M79604 Pain in right leg: Secondary | ICD-10-CM | POA: Diagnosis present

## 2023-11-29 DIAGNOSIS — I824Y1 Acute embolism and thrombosis of unspecified deep veins of right proximal lower extremity: Secondary | ICD-10-CM

## 2023-11-29 DIAGNOSIS — M79661 Pain in right lower leg: Secondary | ICD-10-CM

## 2023-11-29 DIAGNOSIS — I82401 Acute embolism and thrombosis of unspecified deep veins of right lower extremity: Secondary | ICD-10-CM | POA: Diagnosis not present

## 2023-11-29 DIAGNOSIS — I82451 Acute embolism and thrombosis of right peroneal vein: Secondary | ICD-10-CM | POA: Insufficient documentation

## 2023-11-29 DIAGNOSIS — R7989 Other specified abnormal findings of blood chemistry: Secondary | ICD-10-CM

## 2023-11-29 LAB — CBC WITH DIFFERENTIAL/PLATELET
Abs Immature Granulocytes: 0.02 10*3/uL (ref 0.00–0.07)
Basophils Absolute: 0.1 10*3/uL (ref 0.0–0.1)
Basophils Relative: 1 %
Eosinophils Absolute: 0.1 10*3/uL (ref 0.0–0.5)
Eosinophils Relative: 1 %
HCT: 46.3 % — ABNORMAL HIGH (ref 36.0–46.0)
Hemoglobin: 14.7 g/dL (ref 12.0–15.0)
Immature Granulocytes: 0 %
Lymphocytes Relative: 23 %
Lymphs Abs: 1.9 10*3/uL (ref 0.7–4.0)
MCH: 27.7 pg (ref 26.0–34.0)
MCHC: 31.7 g/dL (ref 30.0–36.0)
MCV: 87.4 fL (ref 80.0–100.0)
Monocytes Absolute: 0.4 10*3/uL (ref 0.1–1.0)
Monocytes Relative: 5 %
Neutro Abs: 5.7 10*3/uL (ref 1.7–7.7)
Neutrophils Relative %: 70 %
Platelets: 291 10*3/uL (ref 150–400)
RBC: 5.3 MIL/uL — ABNORMAL HIGH (ref 3.87–5.11)
RDW: 12.9 % (ref 11.5–15.5)
WBC: 8.1 10*3/uL (ref 4.0–10.5)
nRBC: 0 % (ref 0.0–0.2)

## 2023-11-29 LAB — D-DIMER, QUANTITATIVE: D-Dimer, Quant: 0.89 ug{FEU}/mL — ABNORMAL HIGH (ref <0.50)

## 2023-11-29 LAB — HCG, SERUM, QUALITATIVE: Preg, Serum: NEGATIVE

## 2023-11-29 LAB — BASIC METABOLIC PANEL WITH GFR
Anion gap: 9 (ref 5–15)
BUN: 16 mg/dL (ref 6–20)
CO2: 23 mmol/L (ref 22–32)
Calcium: 9.4 mg/dL (ref 8.9–10.3)
Chloride: 105 mmol/L (ref 98–111)
Creatinine, Ser: 0.69 mg/dL (ref 0.44–1.00)
GFR, Estimated: >60 mL/min (ref >60)
Glucose, Bld: 100 mg/dL — ABNORMAL HIGH (ref 70–99)
Potassium: 3.8 mmol/L (ref 3.5–5.1)
Sodium: 137 mmol/L (ref 135–145)

## 2023-11-29 MED ORDER — APIXABAN (ELIQUIS) VTE STARTER PACK (10MG AND 5MG)
ORAL_TABLET | ORAL | 0 refills | Status: DC
  Filled 2023-11-29: qty 74, 30d supply, fill #0

## 2023-11-29 NOTE — Telephone Encounter (Signed)
 Spoke with Monica Ortega, informed her of the message below and she stated the test was already cancelled.

## 2023-11-29 NOTE — Telephone Encounter (Signed)
 Copied from CRM [id number]. Topic: Clinical - Medical Advice >> Nov 29, 2023 10:05 AM Elita Guitar wrote: Reason for CRM: Perla Bradford from [facility] called regarding the order they received for an ultrasound. They need to know if they can cancel the order because the patient's appt was scheduled for today but she just did an ultrasound at the hospital she's at now. Please call and advise at [phone].

## 2023-11-29 NOTE — Telephone Encounter (Signed)
Yes ok to cancel

## 2023-11-29 NOTE — Discharge Instructions (Signed)
You have been seen in the Emergency Department (ED) today and diagnosed with a deep vein thrombosis (DVT), which is a blood clot in one of your veins.  As we discussed, we are starting you on a blood thinner.  Though these carry with them the risk of bleeding, the risks of not treating your DVT (stroke, a clot in your lungs, etc.) are greater than the risk of taking the medication. ° °Please follow up with the doctor listed in these papers as recommended regarding today's ED visit.     ° °Return to the ED if you have worsening pain, ANY trouble breathing, chest pain, or other new or worsening symptoms that concern you. ° °

## 2023-11-29 NOTE — ED Provider Notes (Signed)
 Emergency Department Provider Note   I have reviewed the triage vital signs and the nursing notes.   HISTORY  Chief Complaint Leg Swelling and Abnormal Lab   HPI Monica Ortega is a 33 y.o. female with past history of DVT, not currently on anticoagulation, presents emergency department with right leg cramping pain she reports a prior history of DVT in 2020 which sounds extensive based on her description.  It was actually discovered on a CT abdomen pelvis.  She reports having a temporary filter placed and was started on Eliquis .  The filter has since been removed according to the patient and she was sized by her physician at the time that she could discontinue the Eliquis .  It was thought to be due to prolonged bedrest in the setting of ulcerative colitis.  She has since been up and active.  No recent surgeries or travel.  She does take an oral contraceptive medication.  She is not having any chest pain, shortness of breath, near syncope symptoms.  Her PCP yesterday and D-dimer was performed which was elevated and based on that presents today for DVT evaluation.    Past Medical History:  Diagnosis Date   Ankle sprain    Anxiety    Colitis    Depression    denies hospitalization or medication   DVT (deep venous thrombosis) (HCC) 10/31/2018   DVT (deep venous thrombosis) (HCC)    Hyponatremia    Universal ulcerative colitis with rectal bleeding (HCC) 10/31/2018    Review of Systems  Constitutional: No fever/chills Cardiovascular: Denies chest pain. Respiratory: Denies shortness of breath. Gastrointestinal: No abdominal pain.  Musculoskeletal: Positive cramping pain in the right leg.  Skin: Negative for rash. Neurological: Negative for headaches, focal weakness or numbness.  ____________________________________________   PHYSICAL EXAM:  VITAL SIGNS: ED Triage Vitals  Encounter Vitals Group     BP 11/29/23 0822 129/82     Pulse Rate 11/29/23 0822 82     Resp  11/29/23 0822 16     Temp 11/29/23 0822 97.9 F (36.6 C)     Temp Source 11/29/23 0822 Oral     SpO2 11/29/23 0822 99 %     Weight 11/29/23 0827 147 lb (66.7 kg)     Height 11/29/23 0827 5\' 4"  (1.626 m)   Constitutional: Alert and oriented. Well appearing and in no acute distress. Eyes: Conjunctivae are normal. Head: Atraumatic. Nose: No congestion/rhinnorhea. Mouth/Throat: Mucous membranes are moist. Neck: No stridor.  Cardiovascular: Normal rate, regular rhythm. Good peripheral circulation. Grossly normal heart sounds.   Respiratory: Normal respiratory effort.  No retractions. Lungs CTAB. Gastrointestinal: No distention.  Musculoskeletal: Slight swelling of the right calf compared to the left. No joint erythema or effusion.  Neurologic:  Normal speech and language. No gross focal neurologic deficits are appreciated.  Skin:  Skin is warm, dry and intact. No rash noted.  ____________________________________________   LABS (all labs ordered are listed, but only abnormal results are displayed)  Labs Reviewed  BASIC METABOLIC PANEL WITH GFR - Abnormal; Notable for the following components:      Result Value   Glucose, Bld 100 (*)    All other components within normal limits  CBC WITH DIFFERENTIAL/PLATELET - Abnormal; Notable for the following components:   RBC 5.30 (*)    HCT 46.3 (*)    All other components within normal limits  HCG, SERUM, QUALITATIVE   ____________________________________________  RADIOLOGY  VAS US  LOWER EXTREMITY VENOUS (DVT) (7a-7p) Result Date: 11/29/2023  Lower Venous DVT Study Patient Name:  [facility]  Date of Exam:   11/29/2023 Medical Rec #: [id number]            Accession #:    [id number] Date of Birth: [birth year]             Patient Gender: F Patient Age:   18 years Exam Location:  [facility] Procedure:      VAS US  LOWER EXTREMITY VENOUS (DVT) Referring Phys: Oluwasemilore Bahl  --------------------------------------------------------------------------------  Indications: Pain.  Risk Factors: DVT first dx'd 2020. Comparison Study: Previous exam on 09/24/2019 was positive for chronic DVT distal                   FV and PopV Performing Technologist: Jody Hill RVT, RDMS  Examination Guidelines: A complete evaluation includes B-mode imaging, spectral Doppler, color Doppler, and power Doppler as needed of all accessible portions of each vessel. Bilateral testing is considered an integral part of a complete examination. Limited examinations for reoccurring indications may be performed as noted. The reflux portion of the exam is performed with the patient in reverse Trendelenburg.  +---------+---------------+---------+-----------+----------+------------------+ RIGHT    CompressibilityPhasicitySpontaneityPropertiesThrombus Aging     +---------+---------------+---------+-----------+----------+------------------+ CFV      Full           No       Yes        pulsatile                    +---------+---------------+---------+-----------+----------+------------------+ SFJ      Full                                                            +---------+---------------+---------+-----------+----------+------------------+ FV Prox  Full           Yes      Yes                                     +---------+---------------+---------+-----------+----------+------------------+ FV Mid   Full           Yes      Yes                                     +---------+---------------+---------+-----------+----------+------------------+ FV DistalFull           Yes      Yes                                     +---------+---------------+---------+-----------+----------+------------------+ PFV      Full                                                            +---------+---------------+---------+-----------+----------+------------------+ POP      Partial        Yes      Yes  Chronic            +---------+---------------+---------+-----------+----------+------------------+ PTV      Full                                                            +---------+---------------+---------+-----------+----------+------------------+ PERO     Partial        No       No                   Acute - one of                                                           paired             +---------+---------------+---------+-----------+----------+------------------+   +----+---------------+---------+-----------+----------+--------------+ LEFTCompressibilityPhasicitySpontaneityPropertiesThrombus Aging +----+---------------+---------+-----------+----------+--------------+ CFV Full           No       Yes        pulsatile                +----+---------------+---------+-----------+----------+--------------+    Summary: RIGHT: - Findings consistent with acute deep vein thrombosis involving the right peroneal veins.  - Findings consistent with chronic deep vein thrombosis involving the right popliteal vein.  - No cystic structure found in the popliteal fossa.  LEFT: - No evidence of common femoral vein obstruction.   *See table(s) above for measurements and observations. Electronically signed by Jimmye Moulds MD on 11/29/2023 at 11:28:46 AM.    Final     ____________________________________________   PROCEDURES  Procedure(s) performed:   Procedures  None ____________________________________________   INITIAL IMPRESSION / ASSESSMENT AND PLAN / ED COURSE  Pertinent labs & imaging results that were available during my care of the patient were reviewed by me and considered in my medical decision making (see chart for details).   This patient is Presenting for Evaluation of leg pain, which does require a range of treatment options, and is a complaint that involves a high risk of morbidity and mortality.  The Differential Diagnoses include  DVT, MSK strain, fracture, dislocation, compartment syndrome, etc.  Clinical Laboratory Tests Ordered, included CBC without anemia or thrombocytopenia.  Pregnancy negative. Noraml renal function.   Radiologic Tests Ordered, included DVT US . I independently interpreted the images and agree with radiology interpretation.   Cardiac Monitor Tracing which shows NSR.    Social Determinants of Health Risk patient is a non-smoker.   Medical Decision Making: Summary:  The patient presents emergency department with right leg swelling and cramping.  No clinical symptoms to suggest a large pulmonary embolism.  She does have history of DVT and is at risk for this OCP use.  She is no longer anticoagulated.  DVT ultrasound along with screening blood work.  I did review past vascular surgery operative notes.  It filter was retrieved by Dr. Vikki Graves on 02/06/2019.   Reevaluation with update and discussion with patient. Plan to re-start Eliquis  with acute DVT. No large, central, or severely occlusive thrombus to prompt vascular surgery consultation. Plan for close PCP follow up as well. No PE symptoms or vital sign abnormalities.  Patient's presentation is most consistent with acute presentation with potential threat to life or bodily function.   Disposition: discharge  ____________________________________________  FINAL CLINICAL IMPRESSION(S) / ED DIAGNOSES  Final diagnoses:  Acute deep vein thrombosis (DVT) of right peroneal vein (HCC)     NEW OUTPATIENT MEDICATIONS STARTED DURING THIS VISIT:  Discharge Medication List as of 11/29/2023 11:40 AM     START taking these medications   Details  APIXABAN  (ELIQUIS ) VTE STARTER PACK (10MG  AND 5MG ) Take as directed on package: start with two-5mg  tablets twice daily for 7 days. On day 8, switch to one-5mg  tablet twice daily., Normal        Note:  This document was prepared using Dragon voice recognition software and may include unintentional dictation  errors.  Abby Hocking, MD, [facility]    Rashell Shambaugh, Shereen Dike, MD 11/30/23 [phone]

## 2023-11-29 NOTE — ED Triage Notes (Signed)
 Pt presents d/t having an elevated D-dimer and mild pain and swelling in her RLE. Pain score 2/10.  Pt reports Hx of DVTs in BLEs several years ago. Pt is not on blood thinners.

## 2023-11-29 NOTE — Progress Notes (Signed)
 BLE venous duplex has been completed.  Preliminary results given to Dr. Dolan Freiberg.   Results can be found under chart review under CV PROC. 11/29/2023 11:18 AM Arrington Bencomo RVT, RDMS

## 2023-11-30 MED ORDER — APIXABAN 5 MG PO TABS
5.0000 mg | ORAL_TABLET | Freq: Two times a day (BID) | ORAL | 5 refills | Status: DC
Start: 2023-11-30 — End: 2024-05-28
  Filled 2024-02-06: qty 60, 30d supply, fill #0
  Filled 2024-03-03: qty 60, 30d supply, fill #1
  Filled 2024-04-02: qty 60, 30d supply, fill #2
  Filled 2024-05-02: qty 60, 30d supply, fill #3

## 2023-12-02 ENCOUNTER — Encounter: Payer: Self-pay | Admitting: Family Medicine

## 2023-12-02 NOTE — Assessment & Plan Note (Signed)
 Pt is having issues with a "let down"  once the adderall wears off and it wasn't exactly lasting long enough into the evening. We discussed switching to vyvanse , a longer acting capsule to see if this would improve the symptoms, she is agreeable. RTC in 3 months for med refills.

## 2023-12-08 ENCOUNTER — Telehealth: Payer: Self-pay

## 2023-12-08 NOTE — Progress Notes (Signed)
   12/08/2023  Patient ID: Monica Ortega, female   DOB: Aug 19, 1990, 33 y.o.   MRN: [id number]  Received message from PCP regarding cost concerns on patient's eliquis . Attempted to contact patient to discuss but had to leave a voicemail.  Attempted to sign patient up for copay card to make eliquis  $10/30ds; however, system shows she had card already and would not let me enroll her. Patient may have reached the max 24 months of use with eliquis  use in the past for the copay card.  If this is the case, may need to consider switch to xarelto that also offers a copay card. Left voicemail with patient asking for a return call.  Carnell Christian, PharmD Clinical Pharmacist [phone]

## 2023-12-08 NOTE — Telephone Encounter (Signed)
 Hey -- there is a Goodrx coupon for the vyvanse  at the [facility] for 67 dollars, but I'm not sure what to do about the eliquis -- can we get her enrolled in the patient assistance program? Thanks for your help! Ifyou need a consult just let me know

## 2024-01-28 ENCOUNTER — Encounter: Payer: Self-pay | Admitting: Family Medicine

## 2024-02-01 ENCOUNTER — Other Ambulatory Visit: Payer: Self-pay | Admitting: Family Medicine

## 2024-02-01 ENCOUNTER — Other Ambulatory Visit (HOSPITAL_COMMUNITY): Payer: Self-pay

## 2024-02-06 ENCOUNTER — Other Ambulatory Visit (HOSPITAL_COMMUNITY): Payer: Self-pay

## 2024-02-07 ENCOUNTER — Other Ambulatory Visit (HOSPITAL_COMMUNITY): Payer: Self-pay

## 2024-02-21 ENCOUNTER — Other Ambulatory Visit (HOSPITAL_COMMUNITY): Payer: Self-pay

## 2024-02-29 ENCOUNTER — Encounter: Payer: Self-pay | Admitting: Family Medicine

## 2024-02-29 ENCOUNTER — Other Ambulatory Visit (HOSPITAL_COMMUNITY): Payer: Self-pay

## 2024-02-29 ENCOUNTER — Ambulatory Visit: Admitting: Family Medicine

## 2024-02-29 VITALS — BP 110/80 | HR 65 | Temp 98.1°F | Wt 142.8 lb

## 2024-02-29 DIAGNOSIS — Z3009 Encounter for other general counseling and advice on contraception: Secondary | ICD-10-CM | POA: Diagnosis not present

## 2024-02-29 DIAGNOSIS — I82423 Acute embolism and thrombosis of iliac vein, bilateral: Secondary | ICD-10-CM | POA: Diagnosis not present

## 2024-02-29 DIAGNOSIS — F902 Attention-deficit hyperactivity disorder, combined type: Secondary | ICD-10-CM | POA: Diagnosis not present

## 2024-02-29 MED ORDER — AMPHETAMINE-DEXTROAMPHET ER 25 MG PO CP24: 25.0000 mg | ORAL_CAPSULE | ORAL | 0 refills | Status: DC

## 2024-02-29 MED ORDER — AMPHETAMINE-DEXTROAMPHET ER 25 MG PO CP24
25.0000 mg | ORAL_CAPSULE | ORAL | 0 refills | Status: DC
  Filled 2024-02-29: qty 16, 16d supply, fill #0
  Filled 2024-02-29: qty 14, 14d supply, fill #0

## 2024-02-29 NOTE — Progress Notes (Signed)
 Established Patient Office Visit  Subjective   Patient ID: Monica Ortega, female    DOB: Apr 27, 1991  Age: 33 y.o. MRN: [id number]  Chief Complaint  Patient presents with   Medical Management of Chronic Issues   Medication Problem    Patient requests to change from Vyvanse  back to Adderall  due to cost of $100 a month    Pt is here for follow up on med refills. She reports that the vyvanse  is 100 dollars per month which is very expensive and she would like to go back to the adderall due to cost. States she has been on this before and it works relatively well, states it does cause more jitteriness but overall not to a severe degree.   Chronic and acute DVT -- we discussed her DVT, she has been on the Eliquis  for about 3 months now, is tolerating it well. We discussed her choice of birth control and the risk of recurrent DVT with OCP's and I discussed nonhormonal options including the paragard IUD. She is agreeable to referral to GYN for counseling and discussion. Also since this is her second DVT and she has a history of inflammatory bowel disease which increases her long term risk of DVT, I advised that she consult with the vascular surgeon to decide if she needs to remain on Eliquis  long term. Pt is agreeable to the referral.     Current Outpatient Medications  Medication Instructions   acetaminophen  (TYLENOL ) 1,000 mg, Every 6 hours PRN   amphetamine -dextroamphetamine  (ADDERALL XR) 25 MG 24 hr capsule 1 capsule, Oral, BH-each morning   [START ON 03/28/2024] amphetamine -dextroamphetamine  (ADDERALL XR) 25 MG 24 hr capsule 1 capsule, Oral, BH-each morning   [START ON 04/22/2024] amphetamine -dextroamphetamine  (ADDERALL XR) 25 MG 24 hr capsule 1 capsule, Oral, BH-each morning   APIXABAN  (ELIQUIS ) VTE STARTER PACK (10MG  AND 5MG ) Take as directed on package: start with two-5mg  tablets twice daily for 7 days. On day 8, switch to one-5mg  tablet twice daily.   cetirizine (ZYRTEC) 10 mg, Daily    COVID-19 mRNA bivalent vaccine, Pfizer, (PFIZER COVID-19 VAC BIVALENT) injection Intramuscular   cyanocobalamin 1000 MCG tablet Take by mouth.   Eliquis  5 mg, Oral, 2 times daily   levonorgestrel -ethinyl estradiol (ALESSE) 0.1-20 MG-MCG tablet 1 tablet, Oral, Daily   loperamide  (IMODIUM ) 2-4 mg, 2 times daily PRN   Multiple Vitamin (MULTIVITAMIN) tablet 1 tablet, Daily   Omega-3 Fatty Acids (FISH OIL PO) Daily      Review of Systems  All other systems reviewed and are negative.     Objective:     BP 110/80   Pulse 65   Temp 98.1 F (36.7 C) (Oral)   Wt 142 lb 12.8 oz (64.8 kg)   LMP 02/25/2024 (Exact Date)   SpO2 98%   BMI 24.51 kg/m    Physical Exam Vitals reviewed.  Constitutional:      Appearance: Normal appearance. She is well-groomed and normal weight.  Cardiovascular:     Rate and Rhythm: Normal rate and regular rhythm.     Pulses: Normal pulses.     Heart sounds: S1 normal and S2 normal.  Pulmonary:     Effort: Pulmonary effort is normal.     Breath sounds: Normal breath sounds and air entry.  Musculoskeletal:     Right lower leg: No edema.     Left lower leg: No edema.  Neurological:     Mental Status: She is alert and oriented to person, place, and  time. Mental status is at baseline.     Gait: Gait is intact.  Psychiatric:        Mood and Affect: Mood and affect normal.        Speech: Speech normal.        Behavior: Behavior normal.      No results found for any visits on 02/29/24.    The ASCVD Risk score (Arnett DK, et al., 2019) failed to calculate for the following reasons:   The 2019 ASCVD risk score is only valid for ages 58 to 70    Assessment & Plan:  Attention deficit hyperactivity disorder (ADHD), combined type Assessment & Plan: Vyvanse  too costly, will place her back on adderall. Scripts written.   Orders: -     Amphetamine -Dextroamphet ER; Take 1 capsule by mouth every morning.  Dispense: 30 capsule; Refill: 0 -      Amphetamine -Dextroamphet ER; Take 1 capsule by mouth every morning.  Dispense: 30 capsule; Refill: 0 -     Amphetamine -Dextroamphet ER; Take 1 capsule by mouth every morning.  Dispense: 30 capsule; Refill: 0  Encounter for counseling regarding contraception -     Ambulatory referral to Gynecology  Acute deep vein thrombosis (DVT) of iliac vein of both lower extremities (HCC) Assessment & Plan: On Eliquis  5 mg BID, tolerating the medication well, will send to vascular to discuss wether or not long term AC is going to be needed. We also discussed stopping the OCP due to the increased risk, will send her to GYN to discuss other options.   Orders: -     Ambulatory referral to Vascular Surgery     Return in about 3 months (around 05/31/2024) for annual physical exam, come fasting for bloodwork.    Heron CHRISTELLA Sharper, MD

## 2024-02-29 NOTE — Assessment & Plan Note (Addendum)
 On Eliquis  5 mg BID, tolerating the medication well, will send to vascular to discuss wether or not long term AC is going to be needed. We also discussed stopping the OCP due to the increased risk, will send her to GYN to discuss other options.

## 2024-02-29 NOTE — Assessment & Plan Note (Signed)
 Vyvanse  too costly, will place her back on adderall. Scripts written.

## 2024-03-01 DIAGNOSIS — H04123 Dry eye syndrome of bilateral lacrimal glands: Secondary | ICD-10-CM | POA: Diagnosis not present

## 2024-03-20 ENCOUNTER — Encounter: Payer: Self-pay | Admitting: Family Medicine

## 2024-03-20 ENCOUNTER — Other Ambulatory Visit: Payer: Self-pay | Admitting: Family Medicine

## 2024-03-20 DIAGNOSIS — I82423 Acute embolism and thrombosis of iliac vein, bilateral: Secondary | ICD-10-CM

## 2024-04-15 ENCOUNTER — Encounter: Payer: Self-pay | Admitting: Family Medicine

## 2024-04-15 ENCOUNTER — Ambulatory Visit: Admitting: Family Medicine

## 2024-04-15 VITALS — BP 110/70 | HR 70 | Temp 98.2°F | Ht 64.0 in | Wt 145.8 lb

## 2024-04-15 DIAGNOSIS — N758 Other diseases of Bartholin's gland: Secondary | ICD-10-CM

## 2024-04-15 MED ORDER — CEFDINIR 300 MG PO CAPS: 300.0000 mg | ORAL_CAPSULE | Freq: Two times a day (BID) | ORAL | 0 refills | Status: AC

## 2024-04-15 NOTE — Progress Notes (Signed)
 Established Patient Office Visit  Subjective   Patient ID: Monica Ortega, female    DOB: 1991/03/14  Age: 33 y.o. MRN: [id number]  Chief Complaint  Patient presents with   Mass    Patient complains of right vaginal cyst or lump x2 weeks, initially noticed brown drainage 1.5 weeks ago, tried Epsom salt baths with no relief    HPI Discussed the use of AI scribe software for clinical note transcription with the patient, who gave verbal consent to proceed.  History of Present Illness   Monica Ortega is a 33 year old female who presents with a suspected Bartholin cyst.  She has a lump on the right side, more inside and posterior, described as a small, pea-sized area. It has been present for about two weeks, varying in size and pain, and feels harder than desired. The lump is tender to touch but not hot, and she does not believe it is currently infected. Sitz baths provide some relief, but the lump has not resolved completely. She recalls only one previous instance of infection but cannot remember which side it was on. No allergies to antibiotics.       Current Outpatient Medications  Medication Instructions   acetaminophen  (TYLENOL ) 1,000 mg, Every 6 hours PRN   amphetamine -dextroamphetamine  (ADDERALL XR) 25 MG 24 hr capsule 1 capsule, Oral, BH-each morning   amphetamine -dextroamphetamine  (ADDERALL XR) 25 MG 24 hr capsule 1 capsule, Oral, BH-each morning   [START ON 04/22/2024] amphetamine -dextroamphetamine  (ADDERALL XR) 25 MG 24 hr capsule 1 capsule, Oral, BH-each morning   APIXABAN  (ELIQUIS ) VTE STARTER PACK (10MG  AND 5MG ) Take as directed on package: start with two-5mg  tablets twice daily for 7 days. On day 8, switch to one-5mg  tablet twice daily.   cefdinir (OMNICEF) 300 mg, Oral, 2 times daily   cetirizine (ZYRTEC) 10 mg, Daily   COVID-19 mRNA bivalent vaccine, Pfizer, (PFIZER COVID-19 VAC BIVALENT) injection Intramuscular   cyanocobalamin 1000 MCG tablet Take by  mouth.   Eliquis  5 mg, Oral, 2 times daily   levonorgestrel -ethinyl estradiol (ALESSE) 0.1-20 MG-MCG tablet 1 tablet, Oral, Daily   loperamide  (IMODIUM ) 2-4 mg, 2 times daily PRN   Multiple Vitamin (MULTIVITAMIN) tablet 1 tablet, Daily   Omega-3 Fatty Acids (FISH OIL PO) Daily    Patient Active Problem List   Diagnosis Date Noted   Attention deficit hyperactivity disorder (ADHD), combined type 12/22/2022   Healthy adult on routine physical examination 04/27/2022   Immunization due 02/17/2022   Ulcerative colitis with complication (HCC) 12/27/2018   DVT (deep venous thrombosis) (HCC) 10/31/2018   Rectal bleed 10/31/2018   Universal ulcerative colitis with rectal bleeding (HCC) 10/31/2018   Hypokalemia 10/31/2018   Hyponatremia 10/31/2018   Abdominal pain 09/30/2018   MDD (major depressive disorder), recurrent severe, without psychosis (HCC) 04/16/2015     Review of Systems  All other systems reviewed and are negative.     Objective:     BP 110/70   Pulse 70   Temp 98.2 F (36.8 C) (Oral)   Ht 5' 4 (1.626 m)   Wt 145 lb 12.8 oz (66.1 kg)   LMP 03/24/2024 (Exact Date)   SpO2 98%   BMI 25.03 kg/m    Physical Exam Vitals reviewed.  Skin:    Comments: Small pea sized area 7 o'clock area that is round, well circumscribed, slightly tender to palpation, no surrounding erythema or fluctuance, no induration of the skin.       No results found for  any visits on 04/15/24.    The ASCVD Risk score (Arnett DK, et al., 2019) failed to calculate for the following reasons:   The 2019 ASCVD risk score is only valid for ages 85 to 21    Assessment & Plan:  Bartholin's gland infection -     Cefdinir; Take 1 capsule (300 mg total) by mouth 2 (two) times daily for 7 days.  Dispense: 14 capsule; Refill: 0   Assessment and Plan    Right Bartholin gland cyst Presents with a right Bartholin gland cyst for approximately two weeks. The cyst is small, pea-sized, located in the 6  to 7 o'clock area, more posterior and inside. It is tender to palpation but not currently infected, as there is no redness or swelling of the overlying skin. The cyst is deep to the skin and feels boggy, suggesting a possible early infection. She had a previous Bartholin cyst drained in the ER a couple of years ago. - Prescribe cefdinir for 7 days to cover both gram-positive and gram-negative organisms. - Advise warm sitz baths with Epsom salt to facilitate drainage. - Refer to a gynecologist for potential removal if the cyst is problematic or recurrently infected. - Send prescription to [facility] in [place], [facility].        No follow-ups on file.    Heron CHRISTELLA Sharper, MD

## 2024-05-02 ENCOUNTER — Encounter: Payer: Self-pay | Admitting: Family Medicine

## 2024-05-06 NOTE — Telephone Encounter (Signed)
 I has said if it recurs she should see a GYN but I did not place a referral. Ok to place referral to GYN for vaginal cyst.

## 2024-05-09 ENCOUNTER — Other Ambulatory Visit: Payer: Self-pay | Admitting: Family Medicine

## 2024-05-09 DIAGNOSIS — Z30011 Encounter for initial prescription of contraceptive pills: Secondary | ICD-10-CM

## 2024-05-13 ENCOUNTER — Inpatient Hospital Stay: Attending: Oncology | Admitting: Oncology

## 2024-05-13 ENCOUNTER — Encounter: Payer: Self-pay | Admitting: Oncology

## 2024-05-13 ENCOUNTER — Inpatient Hospital Stay

## 2024-05-13 VITALS — BP 121/83 | HR 72 | Temp 98.1°F | Resp 18 | Ht 64.0 in | Wt 145.5 lb

## 2024-05-13 DIAGNOSIS — I82451 Acute embolism and thrombosis of right peroneal vein: Secondary | ICD-10-CM | POA: Insufficient documentation

## 2024-05-13 DIAGNOSIS — Z803 Family history of malignant neoplasm of breast: Secondary | ICD-10-CM | POA: Insufficient documentation

## 2024-05-13 DIAGNOSIS — I82551 Chronic embolism and thrombosis of right peroneal vein: Secondary | ICD-10-CM | POA: Diagnosis not present

## 2024-05-13 DIAGNOSIS — Z7901 Long term (current) use of anticoagulants: Secondary | ICD-10-CM | POA: Diagnosis not present

## 2024-05-13 DIAGNOSIS — K5190 Ulcerative colitis, unspecified, without complications: Secondary | ICD-10-CM | POA: Diagnosis not present

## 2024-05-13 DIAGNOSIS — I82401 Acute embolism and thrombosis of unspecified deep veins of right lower extremity: Secondary | ICD-10-CM

## 2024-05-13 NOTE — Progress Notes (Signed)
 [facility]  HEMATOLOGY CLINIC CONSULTATION NOTE   PATIENT NAME: Monica Ortega   MR#: [id number] DOB: 05-24-1991  DATE OF SERVICE: 05/13/2024   REFERRING PROVIDER  Ozell Heron HERO, MD   Patient Care Team: Ozell Heron HERO, MD as PCP - General (Family Medicine) Abran Norleen SAILOR, MD as Consulting Physician (Gastroenterology) Sheree Penne Bruckner, MD as Consulting Physician (Vascular Surgery) Kandyce Sor, MD as Consulting Physician (Obstetrics and Gynecology)   REASON FOR CONSULTATION/ CHIEF COMPLAINT:  Anticoagulation recommendations in a patient with recurrent DVT  ASSESSMENT & PLAN:  Monica Ortega is a 33 y.o. lady with a past medical history of ulcerative colitis, ADHD, history of depression, recurrent DVT at least since April 2020, was referred to our service for anticoagulation recommendations, given history of recurrent DVT.    Recurrent deep vein thrombosis (DVT) of right lower extremity (HCC) Chronic and recurrent DVT with multiple clots, including chronic DVT and acute DVT in the right peroneal veins.  In August 2022, testing for antiphospholipid antibody syndrome was negative including anticardiolipin antibodies, beta-2 glycoprotein antibodies, lupus anticoagulant. Factor V Leiden mutation, prothrombin gene mutation were negative. Protein C activity, protein S activity, Antithrombin III  activity were all within normal limits.  No evidence of thrombophilia state was noted at that time.   Lifelong anticoagulation recommended due to history of multiple clots.  Her history of ulcerative colitis may be contributing to prothrombotic state.    IVC filter previously removed. No current symptoms of swelling, pain, or discoloration.   No issues with current anticoagulation therapy (Eliquis  5 mg twice daily).  - Continue Eliquis  5 mg twice daily.  - Monitor for symptoms of new swelling, pain, or discoloration.  - Consider prophylactic dosing of  Eliquis  if anemia or other issues arise that would be concerning for occult blood loss.  - Stay hydrated and active, especially during long trips or flights.  - Use compression socks during prolonged standing or sitting.  No additional hematological workup or intervention intervention is needed at this time.  Please reconsult us  as necessary.  Thank you for giving us  an opportunity to participate in her care.   Ulcerative colitis, status post total colectomy with J-pouch Ulcerative colitis diagnosed in 2020, leading to total colectomy and J-pouch creation in 2022. No current symptoms of nausea, vomiting, bowel problems, or acid reflux. No recent GI follow-up unless flare-ups occur. Previous treatments included steroids and infusions, which were unsuccessful. - Continue routine follow-up with GI if flare-ups occur.  Atypical squamous cells of undetermined significance (ASCUS) on Pap smear, 2023 ASCUS noted on Pap smear in 2023. Follow-up with HPV testing Pap smear recommended. No current symptoms of unusual bleeding or other concerns. - Follow up with HPV testing Pap smear before the end of the year.  I reviewed lab results and outside records for this visit and discussed relevant results with the patient. Diagnosis, plan of care and treatment options were also discussed in detail with the patient. Opportunity provided to ask questions and answers provided to her apparent satisfaction. Provided instructions to call our clinic with any problems, questions or concerns prior to return visit. I recommended to continue follow-up with PCP and sub-specialists. She verbalized understanding and agreed with the plan. No barriers to learning was detected.  Chinita Patten, MD  05/13/2024 4:09 PM  [facility] A DEPT OF [facility] [id number] [place] [address] Dept: [phone] Dept Fax: [phone]  HISTORY OF PRESENT  ILLNESS:  Discussed the use of AI scribe software for clinical note transcription with the patient, who gave verbal consent to proceed.  History of Present Illness Monica Ortega is a 33 year old female with a history of deep vein thrombosis and ulcerative colitis who presents for hematology consultation regarding her clotting history. She was referred by Dr. Heron Sharper for evaluation of her history of DVTs.  She has a history of deep vein thrombosis (DVT) first documented in April 2020, discovered incidentally during a scan for abdominal pain related to ulcerative colitis (UC). At that time, clots were found in her inferior vena cava (IVC) and she was treated with an IVC filter, which was removed after a year. She has been on Eliquis  5 mg twice daily since then. In May 2025, an ultrasound showed chronic DVT and an acute DVT in the right peroneal veins, associated with cramping.  Diagnosed with ulcerative colitis in 2020, she underwent a total colectomy and the creation of a J-pouch. No recent flare-ups and has not needed to see her gastroenterologist recently. Previous treatments included steroid treatments and infusions, which were unsuccessful, leading to surgical intervention. No nausea, vomiting, bowel problems, or acid reflux symptoms.  She is active, stands all day at work, and wears compression socks. Plans to go to the gym after the appointment. No trouble breathing, chest pain, or unusual bleeding. Reports normal menstruation and is trying to switch from hormonal birth control to a copper IUD. Her energy levels are described as 'pretty decent'.  In August 2022, testing for antiphospholipid antibody syndrome was negative including anticardiolipin antibodies, beta-2 glycoprotein antibodies, lupus anticoagulant. Factor V Leiden mutation, prothrombin gene mutation were negative. Protein C activity, protein S activity, Antithrombin III  activity were all within normal limits.  No  evidence of thrombophilia state was noted at that time.   She denies fever, cough, diarrhea, or other infectious symptoms.  She denies epistaxis, bloody stool, melena, hematuria, bruising or other bleeding symptoms. She also denies unintentional weight loss, night sweats or other constitutional symptoms.  MEDICAL HISTORY Past Medical History:  Diagnosis Date   Ankle sprain    Anxiety    Colitis    Depression    denies hospitalization or medication   DVT (deep venous thrombosis) (HCC) 10/31/2018   Hyponatremia    Rectal bleed 10/31/2018   Universal ulcerative colitis with rectal bleeding (HCC) 10/31/2018     SURGICAL HISTORY Past Surgical History:  Procedure Laterality Date   BIOPSY  10/01/2018   Procedure: BIOPSY;  Surgeon: Legrand Victory LITTIE DOUGLAS, MD;  Location: [facility] ENDOSCOPY;  Service: Endoscopy;;   BIOPSY  11/01/2018   Procedure: BIOPSY;  Surgeon: Avram Lupita BRAVO, MD;  Location: [facility] ENDOSCOPY;  Service: Endoscopy;;   COLONOSCOPY WITH PROPOFOL  N/A 10/01/2018   Procedure: COLONOSCOPY WITH PROPOFOL ;  Surgeon: Legrand Victory LITTIE DOUGLAS, MD;  Location: [facility] ENDOSCOPY;  Service: Endoscopy;  Laterality: N/A;   COLONOSCOPY WITH PROPOFOL  N/A 11/01/2018   Procedure: COLONOSCOPY WITH PROPOFOL ;  Surgeon: Avram Lupita BRAVO, MD;  Location: [facility] ENDOSCOPY;  Service: Endoscopy;  Laterality: N/A;   IVC FILTER REMOVAL N/A 02/06/2019   Procedure: IVC FILTER REMOVAL;  Surgeon: Sheree Penne Bruckner, MD;  Location: [facility] INVASIVE CV LAB;  Service: Cardiovascular;  Laterality: N/A;   VENA CAVA FILTER PLACEMENT N/A 10/31/2018   Procedure: INSERTION VENA-CAVA FILTER;  Surgeon: Oris Krystal FALCON, MD;  Location: [facility] OR;  Service: Vascular;  Laterality: N/A;   WISDOM TOOTH EXTRACTION  SOCIAL HISTORY: She reports that she has never smoked. She has never used smokeless tobacco. She reports that she does not drink alcohol and does not use drugs. Social History   Socioeconomic History   Marital status: Single    Spouse name:  Not on file   Number of children: Not on file   Years of education: Not on file   Highest education level: Bachelor's degree (e.g., BA, AB, BS)  Occupational History   Occupation: Assitant   Tobacco Use   Smoking status: Never   Smokeless tobacco: Never  Vaping Use   Vaping status: Never Used  Substance and Sexual Activity   Alcohol use: No    Comment: denied alcohol   Drug use: No    Comment: denied   Sexual activity: Yes    Birth control/protection: Pill, None    Comment: pt states no possibility she could be pregnant  Other Topics Concern   Not on file  Social History Narrative   Work or Higher education careers adviser, 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 [employer]      Home Situation: lives with father and sister      Spiritual Beliefs: none, atheist      Lifestyle: 2-4 times per week jogging or elliptical or bike; diet is so so      No EtOH, tobacco or drugs         Social Drivers of Corporate Investment Banker Strain: Low Risk  (04/14/2024)   Overall Financial Resource Strain (CARDIA)    Difficulty of Paying Living Expenses: Not hard at all  Food Insecurity: No Food Insecurity (05/13/2024)   Hunger Vital Sign    Worried About Running Out of Food in the Last Year: Never true    Ran Out of Food in the Last Year: Never true  Transportation Needs: No Transportation Needs (05/13/2024)   PRAPARE - Administrator, Civil Service (Medical): No    Lack of Transportation (Non-Medical): No  Physical Activity: Sufficiently Active (04/14/2024)   Exercise Vital Sign    Days of Exercise per Week: 6 days    Minutes of Exercise per Session: 30 min  Stress: Stress Concern Present (04/14/2024)   [employer] of Occupational Health - Occupational Stress Questionnaire    Feeling of Stress: To some extent  Social Connections: Socially Isolated (04/14/2024)   Social Connection and Isolation Panel    Frequency of Communication with Friends and Family: Once a  week    Frequency of Social Gatherings with Friends and Family: Once a week    Attends Religious Services: Never    Database Administrator or Organizations: No    Attends Engineer, Structural: Not on file    Marital Status: Never married  Intimate Partner Violence: Not At Risk (05/13/2024)   Humiliation, Afraid, Rape, and Kick questionnaire    Fear of Current or Ex-Partner: No    Emotionally Abused: No    Physically Abused: No    Sexually Abused: No    FAMILY HISTORY: Her family history includes Breast cancer in her paternal aunt; Dementia in her maternal grandmother; Diabetes in her mother; Healthy in her sister; Heart disease in her maternal aunt and maternal grandfather; Hypertension in her father.  CURRENT MEDICATIONS   Current Outpatient Medications  Medication Instructions   acetaminophen  (TYLENOL ) 1,000 mg, Every 6 hours PRN   amphetamine -dextroamphetamine  (ADDERALL XR) 25 MG 24 hr capsule 1 capsule, Oral, BH-each morning   APIXABAN  (ELIQUIS )  VTE STARTER PACK (10MG  AND 5MG ) Take as directed on package: start with two-5mg  tablets twice daily for 7 days. On day 8, switch to one-5mg  tablet twice daily.   cetirizine (ZYRTEC) 10 mg, Daily   COVID-19 mRNA bivalent vaccine, Pfizer, (PFIZER COVID-19 VAC BIVALENT) injection Intramuscular   cyanocobalamin 1000 MCG tablet Take by mouth.   Eliquis  5 mg, Oral, 2 times daily   loperamide  (IMODIUM ) 2-4 mg, 2 times daily PRN   Multiple Vitamin (MULTIVITAMIN) tablet 1 tablet, Daily   Omega-3 Fatty Acids (FISH OIL PO) Daily   VIENVA 0.1-20 MG-MCG tablet 1 tablet, Oral, Daily     ALLERGIES  She has no known allergies.  REVIEW OF SYSTEMS:  Review of Systems - Oncology   Rest of the pertinent review of systems is unremarkable except as mentioned above in HPI.  PHYSICAL EXAMINATION:   Onc Performance Status - 05/13/24 1014       ECOG Perf Status   ECOG Perf Status Restricted in physically strenuous activity but ambulatory and  able to carry out work of a light or sedentary nature, e.g., light house work, office work      KPS SCALE   KPS % SCORE Normal activity with effort, some s/s of disease          Vitals:   05/13/24 1000  BP: 121/83  Pulse: 72  Resp: 18  Temp: 98.1 F (36.7 C)  SpO2: 100%   Filed Weights   05/13/24 1000  Weight: 145 lb 8 oz (66 kg)    Physical Exam Constitutional:      General: She is not in acute distress.    Appearance: Normal appearance.  HENT:     Head: Normocephalic and atraumatic.  Cardiovascular:     Rate and Rhythm: Normal rate.  Pulmonary:     Effort: Pulmonary effort is normal. No respiratory distress.  Abdominal:     General: There is no distension.  Musculoskeletal:        General: No tenderness.     Right lower leg: No edema.     Left lower leg: No edema.  Neurological:     General: No focal deficit present.     Mental Status: She is alert and oriented to person, place, and time.  Psychiatric:        Mood and Affect: Mood normal.        Behavior: Behavior normal.      LABORATORY DATA:   I have reviewed the data as listed.  No results found for any visits on 05/13/24.   RADIOGRAPHIC STUDIES:  I have reviewed ultrasound reports dating all the way back to April 2020 which showed evidence of either acute DVT or chronic DVT on multiple occasions.  No orders of the defined types were placed in this encounter.   Future Appointments  Date Time Provider Department Center  05/28/2024  9:00 AM Ozell Heron HERO, MD [facility] [address]    I spent a total of 47 minutes during this encounter with the patient including review of chart and various tests results, discussions about plan of care and coordination of care plan.  This document was completed utilizing speech recognition software. Grammatical errors, random word insertions, pronoun errors, and incomplete sentences are an occasional consequence of this system due to software limitations,  ambient noise, and hardware issues. Any formal questions or concerns about the content, text or information contained within the body of this dictation should be directly addressed to the provider for clarification.

## 2024-05-13 NOTE — Assessment & Plan Note (Signed)
 Chronic and recurrent DVT with multiple clots, including chronic DVT and acute DVT in the right peroneal veins.  In August 2022, testing for antiphospholipid antibody syndrome was negative including anticardiolipin antibodies, beta-2 glycoprotein antibodies, lupus anticoagulant. Factor V Leiden mutation, prothrombin gene mutation were negative. Protein C activity, protein S activity, Antithrombin III  activity were all within normal limits.  No evidence of thrombophilia state was noted at that time.   Lifelong anticoagulation recommended due to history of multiple clots.  Her history of ulcerative colitis may be contributing to prothrombotic state.    IVC filter previously removed. No current symptoms of swelling, pain, or discoloration.   No issues with current anticoagulation therapy (Eliquis  5 mg twice daily).  - Continue Eliquis  5 mg twice daily.  - Monitor for symptoms of new swelling, pain, or discoloration.  - Consider prophylactic dosing of Eliquis  if anemia or other issues arise that would be concerning for occult blood loss.  - Stay hydrated and active, especially during long trips or flights.  - Use compression socks during prolonged standing or sitting.  No additional hematological workup or intervention intervention is needed at this time.  Please reconsult us  as necessary.  Thank you for giving us  an opportunity to participate in her care.

## 2024-05-27 ENCOUNTER — Other Ambulatory Visit (HOSPITAL_BASED_OUTPATIENT_CLINIC_OR_DEPARTMENT_OTHER): Payer: Self-pay

## 2024-05-28 ENCOUNTER — Encounter: Payer: Self-pay | Admitting: Family Medicine

## 2024-05-28 ENCOUNTER — Other Ambulatory Visit: Payer: Self-pay | Admitting: Family Medicine

## 2024-05-28 ENCOUNTER — Ambulatory Visit: Admitting: Family Medicine

## 2024-05-28 VITALS — BP 118/70 | HR 73 | Temp 98.3°F | Ht 64.75 in | Wt 145.8 lb

## 2024-05-28 DIAGNOSIS — I824Y1 Acute embolism and thrombosis of unspecified deep veins of right proximal lower extremity: Secondary | ICD-10-CM | POA: Diagnosis not present

## 2024-05-28 DIAGNOSIS — Z13220 Encounter for screening for lipoid disorders: Secondary | ICD-10-CM | POA: Diagnosis not present

## 2024-05-28 DIAGNOSIS — F902 Attention-deficit hyperactivity disorder, combined type: Secondary | ICD-10-CM | POA: Diagnosis not present

## 2024-05-28 DIAGNOSIS — E876 Hypokalemia: Secondary | ICD-10-CM

## 2024-05-28 LAB — COMPREHENSIVE METABOLIC PANEL WITH GFR
ALT: 16 U/L (ref 0–35)
AST: 17 U/L (ref 0–37)
Albumin: 4.2 g/dL (ref 3.5–5.2)
Alkaline Phosphatase: 68 U/L (ref 39–117)
BUN: 12 mg/dL (ref 6–23)
CO2: 28 meq/L (ref 19–32)
Calcium: 9.4 mg/dL (ref 8.4–10.5)
Chloride: 102 meq/L (ref 96–112)
Creatinine, Ser: 0.89 mg/dL (ref 0.40–1.20)
GFR: 83.73 mL/min (ref >60.00)
Glucose, Bld: 99 mg/dL (ref 70–99)
Potassium: 4.4 meq/L (ref 3.5–5.1)
Sodium: 137 meq/L (ref 135–145)
Total Bilirubin: 0.5 mg/dL (ref 0.2–1.2)
Total Protein: 7.9 g/dL (ref 6.0–8.3)

## 2024-05-28 LAB — LIPID PANEL
Cholesterol: 177 mg/dL (ref 0–200)
HDL: 62.3 mg/dL (ref >39.00)
LDL Cholesterol: 103 mg/dL — ABNORMAL HIGH (ref 0–99)
NonHDL: 114.74
Total CHOL/HDL Ratio: 3
Triglycerides: 59 mg/dL (ref 0.0–149.0)
VLDL: 11.8 mg/dL (ref 0.0–40.0)

## 2024-05-28 MED ORDER — APIXABAN 5 MG PO TABS: 5.0000 mg | ORAL_TABLET | Freq: Two times a day (BID) | ORAL | 5 refills | Status: DC

## 2024-05-28 MED ORDER — AMPHETAMINE-DEXTROAMPHET ER 25 MG PO CP24: 25.0000 mg | ORAL_CAPSULE | ORAL | 0 refills | Status: AC

## 2024-05-28 NOTE — Progress Notes (Unsigned)
 Complete physical exam  Patient: Monica Ortega   DOB: 08/04/1990   33 y.o. Female  MRN: [id number]  Subjective:    Chief Complaint  Patient presents with  . Annual Exam    Monica Ortega is a 33 y.o. female who presents today for a complete physical exam. She reports consuming a general diet. Is trying to eat fruits and veggies daily, eating good sources of protein, does eat dairy. Will eat processed foods about 3-4 times per week. Does eat fast food about twice a week. Home exercise routine includes walking 1-2 hrs per week. Does go to the gym about 3 days per week. She generally feels well. She reports sleeping fairly well. She does not have additional problems to discuss today.    Most recent fall risk assessment:    11/17/2021    8:47 AM  Fall Risk   Falls in the past year? 0  Number falls in past yr: 0  Injury with Fall? 0  Risk for fall due to : No Fall Risks  Follow up Falls evaluation completed      Data saved with a previous flowsheet row definition     Most recent depression screenings:    05/13/2024   10:14 AM 02/29/2024    9:18 AM  PHQ 2/9 Scores  PHQ - 2 Score 2 2  PHQ- 9 Score 7  6      Data saved with a previous flowsheet row definition    Vision:Within last year and Dental: No current dental problems and Receives regular dental care  Patient Active Problem List   Diagnosis Date Noted  . Attention deficit hyperactivity disorder (ADHD), combined type 12/22/2022  . Healthy adult on routine physical examination 04/27/2022  . Ulcerative colitis with complication (HCC) 12/27/2018  . Recurrent deep vein thrombosis (DVT) of right lower extremity (HCC) 10/31/2018  . Universal ulcerative colitis with rectal bleeding (HCC) 10/31/2018  . Hypokalemia 10/31/2018  . Hyponatremia 10/31/2018  . Abdominal pain 09/30/2018  . MDD (major depressive disorder), recurrent severe, without psychosis (HCC) 04/16/2015      Patient Care Team: Ozell Heron HERO,  MD as PCP - General (Family Medicine) Abran Norleen SAILOR, MD as Consulting Physician (Gastroenterology) Sheree Penne Bruckner, MD as Consulting Physician (Vascular Surgery) Kandyce Sor, MD as Consulting Physician (Obstetrics and Gynecology)   Outpatient Medications Prior to Visit  Medication Sig  . acetaminophen  (TYLENOL ) 500 MG tablet Take 1,000 mg by mouth every 6 (six) hours as needed for headache (pain).   . amphetamine -dextroamphetamine  (ADDERALL XR) 25 MG 24 hr capsule Take 1 capsule by mouth every morning.  . apixaban  (ELIQUIS ) 5 MG TABS tablet Take 1 tablet (5 mg total) by mouth 2 (two) times daily.  . cetirizine (ZYRTEC) 10 MG tablet Take 10 mg by mouth daily.  SABRA COVID-19 mRNA bivalent vaccine, Pfizer, (PFIZER COVID-19 VAC BIVALENT) injection Inject into the muscle.  . cyanocobalamin 1000 MCG tablet Take by mouth.  . loperamide  (IMODIUM ) 2 MG capsule Take 2-4 mg by mouth 2 (two) times daily as needed for diarrhea or loose stools.   . Multiple Vitamin (MULTIVITAMIN) tablet Take 1 tablet by mouth daily.  . Omega-3 Fatty Acids (FISH OIL PO) Take by mouth daily.  SABRA VIENVA 0.1-20 MG-MCG tablet TAKE 1 TABLET BY MOUTH EVERY DAY  . [DISCONTINUED] APIXABAN  (ELIQUIS ) VTE STARTER PACK (10MG  AND 5MG ) Take as directed on package: start with two-5mg  tablets twice daily for 7 days. On day 8, switch to one-5mg  tablet  twice daily.   No facility-administered medications prior to visit.    Review of Systems  HENT:  Negative for hearing loss.   Eyes:  Negative for blurred vision.  Respiratory:  Negative for shortness of breath.   Cardiovascular:  Negative for chest pain.  Gastrointestinal: Negative.   Genitourinary: Negative.   Musculoskeletal:  Negative for back pain.  Neurological:  Negative for headaches.  Psychiatric/Behavioral:  Negative for depression.        Objective:     BP 118/70   Pulse 73   Temp 98.3 F (36.8 C) (Oral)   Ht 5' 4.75 (1.645 m)   Wt 145 lb 12.8 oz (66.1  kg)   LMP 05/19/2024 (Exact Date)   SpO2 98%   BMI 24.45 kg/m  {Vitals History (Optional):23777}  Physical Exam Vitals reviewed.  Constitutional:      Appearance: Normal appearance. She is well-groomed and normal weight.  HENT:     Right Ear: Tympanic membrane and ear canal normal.     Left Ear: Tympanic membrane and ear canal normal.     Mouth/Throat:     Mouth: Mucous membranes are moist.     Pharynx: No posterior oropharyngeal erythema.  Eyes:     Conjunctiva/sclera: Conjunctivae normal.  Neck:     Thyroid : No thyromegaly.  Cardiovascular:     Rate and Rhythm: Normal rate and regular rhythm.     Pulses: Normal pulses.     Heart sounds: S1 normal and S2 normal.  Pulmonary:     Effort: Pulmonary effort is normal.     Breath sounds: Normal breath sounds and air entry.  Abdominal:     General: Abdomen is flat. Bowel sounds are normal.     Palpations: Abdomen is soft.  Musculoskeletal:     Right lower leg: No edema.     Left lower leg: No edema.  Lymphadenopathy:     Cervical: No cervical adenopathy.  Neurological:     Mental Status: She is alert and oriented to person, place, and time. Mental status is at baseline.     Gait: Gait is intact.  Psychiatric:        Mood and Affect: Mood and affect normal.        Speech: Speech normal.        Behavior: Behavior normal.        Judgment: Judgment normal.      No results found for any visits on 05/28/24. {Show previous labs (optional):23779}    Assessment & Plan:    Routine Health Maintenance and Physical Exam  Immunization History  Administered Date(s) Administered  . HPV 9-valent 02/17/2022  . Hepb-cpg 01/30/2024  . Influenza, Seasonal, Injecte, Preservative Fre 05/01/2023  . Influenza,inj,Quad PF,6+ Mos 04/17/2015, 05/31/2017, 04/12/2018, 05/01/2019, 04/14/2021  . Influenza-Unspecified 04/12/2018, 04/13/2022, 01/30/2024, 03/16/2024  . PFIZER(Purple Top)SARS-COV-2 Vaccination 09/10/2019, 10/11/2019, 04/18/2020   . PPD Test 05/03/2021  . Pfizer Covid-19 Vaccine Bivalent Booster 20yrs & up 09/16/2021, 03/17/2023  . Tdap 10/20/2014    Health Maintenance  Topic Date Due  . HPV VACCINES (2 - 3-dose SCDM series) 03/17/2022  . Hepatitis B Vaccines 19-59 Average Risk (2 of 2 - CpG 2-dose series) 02/27/2024  . COVID-19 Vaccine (6 - 2025-26 season) 03/11/2024  . DTaP/Tdap/Td (2 - Td or Tdap) 10/19/2024  . Cervical Cancer Screening (HPV/Pap Cotest)  04/28/2027  . Influenza Vaccine  Completed  . Hepatitis C Screening  Completed  . HIV Screening  Completed  . Pneumococcal Vaccine  Aged Out  .  Meningococcal B Vaccine  Aged Out    Discussed health benefits of physical activity, and encouraged her to engage in regular exercise appropriate for her age and condition.  Hypokalemia -     Comprehensive metabolic panel with GFR; Future  Lipid screening -     Lipid panel; Future  Acute deep vein thrombosis (DVT) of proximal vein of right lower extremity (HCC)  Attention deficit hyperactivity disorder (ADHD), combined type    Return in about 3 months (around 08/28/2024) for video visit for medicatoin refills.     Heron CHRISTELLA Sharper, MD

## 2024-05-28 NOTE — Telephone Encounter (Signed)
 Copied from CRM [id number]. Topic: Clinical - Prescription Issue >> May 28, 2024 10:03 AM Monica Ortega wrote: Reason for CRM: Patient requesting to have the following medication sent to [facility]: amphetamine -dextroamphetamine  (ADDERALL XR) 25 MG 24 hr capsule apixaban  (ELIQUIS ) 5 MG TABS tablet

## 2024-06-03 ENCOUNTER — Ambulatory Visit: Payer: Self-pay | Admitting: Family Medicine

## 2024-06-10 ENCOUNTER — Other Ambulatory Visit (HOSPITAL_COMMUNITY): Payer: Self-pay

## 2024-06-10 ENCOUNTER — Encounter: Payer: Self-pay | Admitting: Family Medicine

## 2024-06-10 DIAGNOSIS — I824Y1 Acute embolism and thrombosis of unspecified deep veins of right proximal lower extremity: Secondary | ICD-10-CM

## 2024-06-10 DIAGNOSIS — Z30011 Encounter for initial prescription of contraceptive pills: Secondary | ICD-10-CM

## 2024-06-11 ENCOUNTER — Telehealth: Payer: Self-pay

## 2024-06-11 ENCOUNTER — Other Ambulatory Visit (HOSPITAL_COMMUNITY): Payer: Self-pay

## 2024-06-11 MED ORDER — LEVONORGESTREL-ETHINYL ESTRAD 0.1-20 MG-MCG PO TABS: 1.0000 | ORAL_TABLET | Freq: Every day | ORAL | 5 refills | Status: DC

## 2024-06-11 NOTE — Telephone Encounter (Signed)
 Pharmacy Patient Advocate Encounter   Received notification from Patient Advice Request messages that prior authorization for Eliquis  5MG  tablets  is required/requested.   Insurance verification completed.   The patient is insured through [facility].   Per test claim: PA required; PA started via CoverMyMeds. KEY [id number] . Waiting for clinical questions to populate.

## 2024-06-11 NOTE — Telephone Encounter (Signed)
 Clinical questions have been answered and PA submitted. PA currently Pending.

## 2024-06-12 ENCOUNTER — Other Ambulatory Visit (HOSPITAL_COMMUNITY): Payer: Self-pay

## 2024-06-12 ENCOUNTER — Other Ambulatory Visit: Payer: Self-pay | Admitting: Family Medicine

## 2024-06-12 DIAGNOSIS — I824Y1 Acute embolism and thrombosis of unspecified deep veins of right proximal lower extremity: Secondary | ICD-10-CM

## 2024-06-12 NOTE — Telephone Encounter (Signed)
 PA is still pending as of yesterday.

## 2024-06-12 NOTE — Telephone Encounter (Signed)
 Pharmacy Patient Advocate Encounter  Received notification from [employer] that Prior Authorization for Eliquis  5MG  tablets  has been CANCELLED due to: Prior Authorization is not required for this medication dosage form and strength at the quantity and days supply requested. This medication does not require a prior authorization because it is a covered benefit. Please have the pharmacy contact [employer] at [phone] if billing assistance is required. Please note, all maintenance medications must be filled at [facility]. Please contact customer service at [phone] for assistance.    PA #/Case ID/Reference #: [id number]    Ran test claim for Eliquis  5MG  tablets. Currently a quantity of 60 is a 30 day supply and the co-pay is $112.52 .

## 2024-06-14 ENCOUNTER — Other Ambulatory Visit: Payer: Self-pay

## 2024-06-14 ENCOUNTER — Other Ambulatory Visit (HOSPITAL_COMMUNITY): Payer: Self-pay

## 2024-06-14 DIAGNOSIS — I824Y1 Acute embolism and thrombosis of unspecified deep veins of right proximal lower extremity: Secondary | ICD-10-CM

## 2024-06-14 MED ORDER — LEVONORGESTREL-ETHINYL ESTRAD 0.1-20 MG-MCG PO TABS
1.0000 | ORAL_TABLET | Freq: Every day | ORAL | 5 refills | Status: AC
  Filled 2024-06-14: qty 28, 28d supply, fill #0
  Filled 2024-08-06: qty 84, 84d supply, fill #1

## 2024-06-14 MED ORDER — APIXABAN 5 MG PO TABS: 5.0000 mg | ORAL_TABLET | Freq: Two times a day (BID) | ORAL | 5 refills | Status: DC

## 2024-06-14 MED ORDER — APIXABAN 5 MG PO TABS
5.0000 mg | ORAL_TABLET | Freq: Two times a day (BID) | ORAL | 5 refills | Status: AC
  Filled 2024-06-14 (×2): qty 60, 30d supply, fill #0
  Filled 2024-08-03: qty 60, 30d supply, fill #1
  Filled 2024-08-04: qty 180, 90d supply, fill #1

## 2024-06-14 NOTE — Telephone Encounter (Signed)
 Left VM letting patient know about Eliquis  being re-sent to pharmacy with no PA needed.

## 2024-06-14 NOTE — Telephone Encounter (Signed)
 Please let the patient know that that prio auth was not needed and the medication was re-sent to the pharmacy

## 2024-06-15 ENCOUNTER — Other Ambulatory Visit (HOSPITAL_COMMUNITY): Payer: Self-pay

## 2024-06-17 ENCOUNTER — Other Ambulatory Visit: Payer: Self-pay

## 2024-06-18 ENCOUNTER — Other Ambulatory Visit: Payer: Self-pay

## 2024-07-11 ENCOUNTER — Encounter: Payer: Self-pay | Admitting: Family Medicine

## 2024-07-15 ENCOUNTER — Encounter: Payer: Self-pay | Admitting: Family Medicine

## 2024-07-18 ENCOUNTER — Other Ambulatory Visit (HOSPITAL_COMMUNITY): Payer: Self-pay

## 2024-07-18 ENCOUNTER — Ambulatory Visit: Admitting: Family Medicine

## 2024-07-18 ENCOUNTER — Encounter: Payer: Self-pay | Admitting: Family Medicine

## 2024-07-18 VITALS — BP 110/80 | HR 67 | Temp 98.3°F | Ht 64.75 in | Wt 146.7 lb

## 2024-07-18 DIAGNOSIS — N758 Other diseases of Bartholin's gland: Secondary | ICD-10-CM

## 2024-07-18 DIAGNOSIS — R5383 Other fatigue: Secondary | ICD-10-CM

## 2024-07-18 MED ORDER — DOXYCYCLINE HYCLATE 100 MG PO TABS
100.0000 mg | ORAL_TABLET | Freq: Two times a day (BID) | ORAL | 0 refills | Status: AC
  Filled 2024-07-18: qty 14, 7d supply, fill #0

## 2024-07-18 NOTE — Progress Notes (Unsigned)
 "  Acute Office Visit  Subjective:     Patient ID: Monica Ortega, female    DOB: Jul 22, 1990, 34 y.o.   MRN: [id number]  Chief Complaint  Patient presents with   right bartolin gland swelling and green D/C x4 days    HPI Discussed the use of AI scribe software for clinical note transcription with the patient, who gave verbal consent to proceed.  History of Present Illness   Monica Ortega is a 34 year old female who presents with a swollen and tender area on the right side that drained three days ago.  She reports over a week of significant swelling and tenderness on the right side, described as very large and painful. About three days ago it started draining a small amount of greenish discharge. She has not had fevers or chills.  She has anemia with previously low hemoglobin in 2020 and now takes a multivitamin with higher iron. She feels very sluggish and fatigued, which affects daily chores, despite normal sleep and no recent stressors or depressive symptoms. Her last CBC in May showed hemoglobin 14. She notes cefdinir  has helped in the past for similar issues and would like her iron levels checked due to current fatigue and prior anemia.       Review of Systems  All other systems reviewed and are negative.       Objective:    BP 110/80   Pulse 67   Temp 98.3 F (36.8 C) (Oral)   Ht 5' 4.75 (1.645 m)   Wt 146 lb 11.2 oz (66.5 kg)   LMP 07/15/2024 (Exact Date)   SpO2 99%   BMI 24.60 kg/m    Physical Exam Vitals reviewed.  Genitourinary:    General: Normal vulva.     Exam position: Knee-chest position.     Pubic Area: No rash.      Labia:        Right: No lesion.        Left: No lesion.   Neurological:     Mental Status: She is alert.     No results found for any visits on 07/18/24.      Assessment & Plan:   Problem List Items Addressed This Visit   None Visit Diagnoses       Bartholin's gland infection    -  Primary   Relevant  Medications   doxycycline  (VIBRA -TABS) 100 MG tablet     Other fatigue       Relevant Orders   TSH   Iron, TIBC and Ferritin Panel   VITAMIN D  25 Hydroxy (Vit-D Deficiency, Fractures)   Vitamin B12     Assessment and Plan    Bartholin's gland infection Right-sided Bartholin's gland infection with swelling and tenderness for over a week. Recent drainage with greenish discharge. No fever or chills. Examination shows reduced swelling and tenderness, with no signs of active infection. - Prescribed doxycycline  for 3-5 days, with option to save remaining for future use if needed. - Advised to contact gynecologists directly for appointments as referral system is not effective.  Fatigue Persistent fatigue with difficulty performing daily activities. Previous anemia before 2020. Possible iron deficiency considered. No identified life events or sleep issues contributing to fatigue. Multivitamin with iron is taken regularly. - Ordered iron panel to assess iron levels. - Ordered thyroid  function tests to rule out thyroid  issues. - Ordered B12 and vitamin D  levels to evaluate for deficiencies.        Meds  ordered this encounter  Medications   doxycycline  (VIBRA -TABS) 100 MG tablet    Sig: Take 1 tablet (100 mg total) by mouth 2 (two) times daily for 7 days.    Dispense:  14 tablet    Refill:  0    No follow-ups on file.  Heron CHRISTELLA Sharper, MD   "

## 2024-07-19 ENCOUNTER — Ambulatory Visit: Payer: Self-pay | Admitting: Family Medicine

## 2024-07-19 LAB — TSH: TSH: 3.06 u[IU]/mL (ref 0.35–5.50)

## 2024-07-19 LAB — IRON,TIBC AND FERRITIN PANEL
%SAT: 8 % — ABNORMAL LOW (ref 16–45)
Ferritin: 37 ng/mL (ref 16–154)
Iron: 27 ug/dL — ABNORMAL LOW (ref 40–190)
TIBC: 351 ug/dL (ref 250–450)

## 2024-07-19 LAB — VITAMIN D 25 HYDROXY (VIT D DEFICIENCY, FRACTURES): VITD: 32.39 ng/mL (ref 30.00–100.00)

## 2024-07-19 LAB — VITAMIN B12: Vitamin B-12: 988 pg/mL — ABNORMAL HIGH (ref 211–911)

## 2024-08-04 ENCOUNTER — Other Ambulatory Visit: Payer: Self-pay

## 2024-08-06 ENCOUNTER — Other Ambulatory Visit: Payer: Self-pay

## 2024-08-07 ENCOUNTER — Other Ambulatory Visit (HOSPITAL_COMMUNITY): Payer: Self-pay

## 2024-08-08 ENCOUNTER — Other Ambulatory Visit: Payer: Self-pay

## 2024-08-12 ENCOUNTER — Telehealth: Payer: Self-pay | Admitting: *Deleted

## 2024-08-12 NOTE — Telephone Encounter (Addendum)
-----   Message from Monica Kiang, MD sent at 08/12/2024  3:30 PM EST ----- Regarding: RE: J pouch Thanks [place].  The next step is to return to Monica Ortega, who can perform pouchoscopy and address any potential surgical issues.  Monica Ortega, Please reach out to this patient and let her know that Dr. Teresa reached out to me and I recommend seeing Monica Ortega straight away.  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 She had mentioned you were previously her GI physician in years past and wasn't seeing others in the interim. I personally hadn't been involved in her care but she came up to me today in the OR asking for some advice. 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 with Dr. Cy Ortega ~5 years ago. It doesn't sound like she had seen you back in the interim. She has developed some drainage of stool/j pouch type contents from her vagina over the last week and was trying to determine next steps. All sounded suspicious for a potential pouch vaginal fistula. She said Monica Ortega had examined her and didn't clearly see a fistula in her vagina per se. She reached out to [facility]'s team and it sounds like had some imaging planned in the near future. I suspect she will need pouchoscopy to evaluate for any evidence of Crohn's and I told her with her permission I'd reach out to you as well. She was going to contact y'all as well.  Appreciate your help.[email or web address]

## 2024-08-15 ENCOUNTER — Other Ambulatory Visit: Payer: Self-pay

## 2024-08-15 ENCOUNTER — Other Ambulatory Visit (HOSPITAL_COMMUNITY): Payer: Self-pay

## 2024-08-29 ENCOUNTER — Ambulatory Visit: Admitting: Family Medicine

## 2024-08-30 ENCOUNTER — Encounter: Payer: Self-pay | Admitting: Obstetrics and Gynecology
